# Patient Record
Sex: Female | Born: 1940 | Race: White | Hispanic: No | Marital: Married | Smoking: Never smoker
Health system: Southern US, Community
[De-identification: ages and names within clinical notes are randomized; demographics above are authoritative.]

## PROBLEM LIST (undated history)

## (undated) DIAGNOSIS — R7989 Other specified abnormal findings of blood chemistry: Secondary | ICD-10-CM

## (undated) DIAGNOSIS — Z8679 Personal history of other diseases of the circulatory system: Secondary | ICD-10-CM

## (undated) DIAGNOSIS — F3181 Bipolar II disorder: Secondary | ICD-10-CM

## (undated) DIAGNOSIS — F329 Major depressive disorder, single episode, unspecified: Secondary | ICD-10-CM

## (undated) DIAGNOSIS — K859 Acute pancreatitis without necrosis or infection, unspecified: Secondary | ICD-10-CM

## (undated) DIAGNOSIS — G43909 Migraine, unspecified, not intractable, without status migrainosus: Secondary | ICD-10-CM

## (undated) DIAGNOSIS — E559 Vitamin D deficiency, unspecified: Secondary | ICD-10-CM

## (undated) DIAGNOSIS — R0600 Dyspnea, unspecified: Secondary | ICD-10-CM

## (undated) DIAGNOSIS — F5104 Psychophysiologic insomnia: Secondary | ICD-10-CM

## (undated) DIAGNOSIS — K760 Fatty (change of) liver, not elsewhere classified: Secondary | ICD-10-CM

## (undated) DIAGNOSIS — E7800 Pure hypercholesterolemia, unspecified: Secondary | ICD-10-CM

## (undated) DIAGNOSIS — R945 Abnormal results of liver function studies: Secondary | ICD-10-CM

## (undated) DIAGNOSIS — J189 Pneumonia, unspecified organism: Secondary | ICD-10-CM

## (undated) DIAGNOSIS — K5730 Diverticulosis of large intestine without perforation or abscess without bleeding: Secondary | ICD-10-CM

## (undated) DIAGNOSIS — K219 Gastro-esophageal reflux disease without esophagitis: Secondary | ICD-10-CM

## (undated) DIAGNOSIS — G4730 Sleep apnea, unspecified: Secondary | ICD-10-CM

## (undated) DIAGNOSIS — M8580 Other specified disorders of bone density and structure, unspecified site: Secondary | ICD-10-CM

## (undated) DIAGNOSIS — I10 Essential (primary) hypertension: Secondary | ICD-10-CM

## (undated) HISTORY — PX: EYE SURGERY: SHX253

## (undated) HISTORY — DX: Psychophysiologic insomnia: F51.04

## (undated) HISTORY — DX: Other specified abnormal findings of blood chemistry: R79.89

## (undated) HISTORY — DX: Abnormal results of liver function studies: R94.5

## (undated) HISTORY — DX: Acute pancreatitis without necrosis or infection, unspecified: K85.90

## (undated) HISTORY — DX: Bipolar II disorder: F31.81

## (undated) HISTORY — DX: Migraine, unspecified, not intractable, without status migrainosus: G43.909

## (undated) HISTORY — PX: ERCP: SHX60

## (undated) HISTORY — PX: NECK SURGERY: SHX720

## (undated) HISTORY — DX: Fatty (change of) liver, not elsewhere classified: K76.0

## (undated) HISTORY — DX: Vitamin D deficiency, unspecified: E55.9

## (undated) HISTORY — DX: Diverticulosis of large intestine without perforation or abscess without bleeding: K57.30

## (undated) HISTORY — PX: CHOLECYSTECTOMY: SHX55

## (undated) HISTORY — DX: Personal history of other diseases of the circulatory system: Z86.79

## (undated) HISTORY — DX: Other specified disorders of bone density and structure, unspecified site: M85.80

## (undated) HISTORY — DX: Major depressive disorder, single episode, unspecified: F32.9

## (undated) HISTORY — DX: Gastro-esophageal reflux disease without esophagitis: K21.9

---

## 2001-01-30 HISTORY — PX: COLONOSCOPY: SHX174

## 2005-01-30 HISTORY — PX: SIGMOIDOSCOPY: SUR1295

## 2005-07-20 ENCOUNTER — Ambulatory Visit (HOSPITAL_COMMUNITY): Payer: Self-pay | Admitting: Psychiatry

## 2005-08-08 ENCOUNTER — Ambulatory Visit (HOSPITAL_COMMUNITY): Payer: Self-pay | Admitting: Psychiatry

## 2005-08-17 ENCOUNTER — Ambulatory Visit (HOSPITAL_COMMUNITY): Payer: Self-pay | Admitting: Psychiatry

## 2005-08-24 ENCOUNTER — Ambulatory Visit (HOSPITAL_COMMUNITY): Payer: Self-pay | Admitting: Psychology

## 2005-09-25 ENCOUNTER — Ambulatory Visit (HOSPITAL_COMMUNITY): Payer: Self-pay | Admitting: Psychology

## 2005-10-16 ENCOUNTER — Ambulatory Visit (HOSPITAL_COMMUNITY): Payer: Self-pay | Admitting: Psychology

## 2005-10-17 ENCOUNTER — Ambulatory Visit (HOSPITAL_COMMUNITY): Payer: Self-pay | Admitting: Psychiatry

## 2005-10-31 ENCOUNTER — Ambulatory Visit (HOSPITAL_COMMUNITY): Payer: Self-pay | Admitting: Psychiatry

## 2005-11-06 ENCOUNTER — Ambulatory Visit (HOSPITAL_COMMUNITY): Payer: Self-pay | Admitting: Psychology

## 2005-11-15 ENCOUNTER — Ambulatory Visit (HOSPITAL_COMMUNITY): Payer: Self-pay | Admitting: Psychology

## 2005-11-24 ENCOUNTER — Ambulatory Visit (HOSPITAL_COMMUNITY): Payer: Self-pay | Admitting: Psychology

## 2006-03-06 ENCOUNTER — Ambulatory Visit (HOSPITAL_COMMUNITY): Payer: Self-pay | Admitting: Psychiatry

## 2006-03-13 ENCOUNTER — Ambulatory Visit (HOSPITAL_COMMUNITY): Payer: Self-pay | Admitting: Psychology

## 2006-04-03 ENCOUNTER — Ambulatory Visit (HOSPITAL_COMMUNITY): Payer: Self-pay | Admitting: Psychiatry

## 2006-04-19 ENCOUNTER — Ambulatory Visit (HOSPITAL_COMMUNITY): Payer: Self-pay | Admitting: Psychology

## 2006-05-08 ENCOUNTER — Ambulatory Visit (HOSPITAL_COMMUNITY): Payer: Self-pay | Admitting: Psychiatry

## 2006-05-21 ENCOUNTER — Ambulatory Visit (HOSPITAL_COMMUNITY): Payer: Self-pay | Admitting: Psychology

## 2006-07-24 ENCOUNTER — Ambulatory Visit (HOSPITAL_COMMUNITY): Payer: Self-pay | Admitting: Psychology

## 2006-08-02 ENCOUNTER — Ambulatory Visit (HOSPITAL_COMMUNITY): Payer: Self-pay | Admitting: Psychiatry

## 2006-08-09 ENCOUNTER — Ambulatory Visit (HOSPITAL_COMMUNITY): Payer: Self-pay | Admitting: Psychology

## 2006-09-04 ENCOUNTER — Ambulatory Visit (HOSPITAL_COMMUNITY): Payer: Self-pay | Admitting: Psychiatry

## 2006-09-11 ENCOUNTER — Ambulatory Visit (HOSPITAL_COMMUNITY): Payer: Self-pay | Admitting: Psychology

## 2006-10-04 ENCOUNTER — Ambulatory Visit (HOSPITAL_COMMUNITY): Payer: Self-pay | Admitting: Psychiatry

## 2006-11-01 ENCOUNTER — Ambulatory Visit (HOSPITAL_COMMUNITY): Payer: Self-pay | Admitting: Psychiatry

## 2006-11-13 ENCOUNTER — Ambulatory Visit (HOSPITAL_COMMUNITY): Payer: Self-pay | Admitting: Psychiatry

## 2006-12-24 ENCOUNTER — Ambulatory Visit (HOSPITAL_COMMUNITY): Payer: Self-pay | Admitting: Psychology

## 2007-01-28 ENCOUNTER — Ambulatory Visit (HOSPITAL_COMMUNITY): Payer: Self-pay | Admitting: Psychology

## 2007-02-07 ENCOUNTER — Ambulatory Visit (HOSPITAL_COMMUNITY): Payer: Self-pay | Admitting: Psychiatry

## 2007-02-18 ENCOUNTER — Ambulatory Visit (HOSPITAL_COMMUNITY): Payer: Self-pay | Admitting: Psychology

## 2007-02-28 ENCOUNTER — Ambulatory Visit (HOSPITAL_COMMUNITY): Payer: Self-pay | Admitting: Psychiatry

## 2007-03-14 ENCOUNTER — Ambulatory Visit (HOSPITAL_COMMUNITY): Payer: Self-pay | Admitting: Psychiatry

## 2007-04-25 ENCOUNTER — Ambulatory Visit (HOSPITAL_COMMUNITY): Payer: Self-pay | Admitting: Psychiatry

## 2007-05-07 ENCOUNTER — Ambulatory Visit (HOSPITAL_COMMUNITY): Payer: Self-pay | Admitting: Psychology

## 2007-06-04 ENCOUNTER — Ambulatory Visit (HOSPITAL_COMMUNITY): Payer: Self-pay | Admitting: Psychiatry

## 2007-06-07 ENCOUNTER — Ambulatory Visit (HOSPITAL_COMMUNITY): Payer: Self-pay | Admitting: Psychology

## 2007-07-02 ENCOUNTER — Ambulatory Visit (HOSPITAL_COMMUNITY): Payer: Self-pay | Admitting: Psychiatry

## 2007-08-06 ENCOUNTER — Ambulatory Visit (HOSPITAL_COMMUNITY): Payer: Self-pay | Admitting: Psychiatry

## 2007-09-30 ENCOUNTER — Ambulatory Visit (HOSPITAL_COMMUNITY): Payer: Self-pay | Admitting: Psychology

## 2007-10-15 ENCOUNTER — Ambulatory Visit (HOSPITAL_COMMUNITY): Payer: Self-pay | Admitting: Psychiatry

## 2007-10-30 ENCOUNTER — Ambulatory Visit (HOSPITAL_COMMUNITY): Payer: Self-pay | Admitting: Psychology

## 2007-12-09 ENCOUNTER — Ambulatory Visit (HOSPITAL_COMMUNITY): Payer: Self-pay | Admitting: Psychology

## 2008-01-14 ENCOUNTER — Ambulatory Visit (HOSPITAL_COMMUNITY): Payer: Self-pay | Admitting: Psychology

## 2008-02-14 ENCOUNTER — Ambulatory Visit (HOSPITAL_COMMUNITY): Payer: Self-pay | Admitting: Psychology

## 2008-03-24 ENCOUNTER — Ambulatory Visit (HOSPITAL_COMMUNITY): Payer: Self-pay | Admitting: Psychology

## 2008-05-26 ENCOUNTER — Ambulatory Visit (HOSPITAL_COMMUNITY): Payer: Self-pay | Admitting: Psychology

## 2008-08-26 ENCOUNTER — Ambulatory Visit (HOSPITAL_COMMUNITY): Payer: Self-pay | Admitting: Psychology

## 2008-11-05 ENCOUNTER — Ambulatory Visit (HOSPITAL_COMMUNITY): Payer: Self-pay | Admitting: Psychology

## 2009-01-04 ENCOUNTER — Ambulatory Visit (HOSPITAL_COMMUNITY): Payer: Self-pay | Admitting: Psychology

## 2009-03-05 ENCOUNTER — Ambulatory Visit (HOSPITAL_COMMUNITY): Payer: Self-pay | Admitting: Psychology

## 2009-04-02 ENCOUNTER — Ambulatory Visit (HOSPITAL_COMMUNITY): Payer: Self-pay | Admitting: Psychology

## 2009-05-03 ENCOUNTER — Ambulatory Visit (HOSPITAL_COMMUNITY): Payer: Self-pay | Admitting: Psychology

## 2009-07-21 ENCOUNTER — Ambulatory Visit (HOSPITAL_COMMUNITY): Payer: Self-pay | Admitting: Psychology

## 2012-08-30 HISTORY — PX: OTHER SURGICAL HISTORY: SHX169

## 2013-07-08 ENCOUNTER — Ambulatory Visit (INDEPENDENT_AMBULATORY_CARE_PROVIDER_SITE_OTHER): Payer: Medicare Other | Admitting: Internal Medicine

## 2013-07-08 ENCOUNTER — Encounter (INDEPENDENT_AMBULATORY_CARE_PROVIDER_SITE_OTHER): Payer: Self-pay | Admitting: Internal Medicine

## 2013-07-08 VITALS — BP 126/80 | HR 72 | Temp 98.3°F | Resp 18 | Ht 64.0 in | Wt 171.9 lb

## 2013-07-08 DIAGNOSIS — R945 Abnormal results of liver function studies: Secondary | ICD-10-CM

## 2013-07-08 DIAGNOSIS — R7989 Other specified abnormal findings of blood chemistry: Secondary | ICD-10-CM

## 2013-07-08 DIAGNOSIS — R109 Unspecified abdominal pain: Secondary | ICD-10-CM

## 2013-07-08 MED ORDER — DICYCLOMINE HCL 10 MG PO CAPS: 10.0000 mg | ORAL_CAPSULE | Freq: Three times a day (TID) | ORAL | Status: DC | PRN

## 2013-07-08 NOTE — Progress Notes (Signed)
Presenting complaint;  Abdominal pain, nausea and abnormal LFTs.  abnormal MRCP suggesting distal CBD stricture.  History of present illness;  Patient is 73 year old Caucasian female who is referred through courtesy of Dr. Aleene DavidsonSpainhour for ERCP. Patient is accompanied by her husband today. She states she's been having episodic mid abdominal pain bilaterally for well over 6 months. Initially this pain was sporadic but lately she's had 2 or 3 episodes every week. With each episode she has mid abdominal pain bilaterally associated with chills and nausea without vomiting. Pain is described to be sharp and may last for several hours or half a day. She had some pain and nausea 2 days ago but her worst pain occurred yesterday. She does not have good appetite during these spells. None of these spells been associated with fever. She's had LFTs on multiple occasions. She was advised to have LFTs following an episode of pain but she was unable to do so. Her bowels usually move every other day.. She denies melena or rectal bleeding. Her weight has been stable. In addition to mid abdominal pain she also experiences sporadic pain under the right rib cage laterally and this pain usually is not associated with nausea and does not necessarily occur with bilateral mid abdominal pain. She has history of GERD but lately she's not had any problems. She takes TUMS for calcium. In addition to blood work she also underwent    Current Medications:   Medication List              AMBIEN PO  Take 10 mg by mouth at bedtime.     AMLODIPINE BESYLATE PO  Take 5 mg by mouth daily.     ARIPiprazole 10 MG tablet  Commonly known as:  ABILIFY  Take 10 mg by mouth at bedtime.     aspirin 81 MG tablet  Take 81 mg by mouth daily.     calcium carbonate 500 MG chewable tablet  Commonly known as:  TUMS - dosed in mg elemental calcium  Chew 1 tablet by mouth 2 (two) times daily.              FISH OIL + D3 PO  Take  1,200 mg by mouth daily.     furosemide 20 MG tablet  Commonly known as:  LASIX  Take 20 mg by mouth daily.     KLOR-CON PO  Take 20 mEq by mouth daily.     lamoTRIgine 25 MG tablet  Commonly known as:  LAMICTAL  Take 20 mg by mouth at bedtime.     losartan 100 MG tablet  Commonly known as:  COZAAR  Take 100 mg by mouth daily.     meloxicam 15 MG tablet  Commonly known as:  MOBIC  Take 15 mg by mouth daily.     metoprolol tartrate 25 MG tablet  Commonly known as:  LOPRESSOR  Take 25 mg by mouth. Patient takes 1/2 tablet twice a day     simvastatin 20 MG tablet  Commonly known as:  ZOCOR  Take 20 mg by mouth daily. In the afternoon     vitamin B-12 1000 MCG tablet  Commonly known as:  CYANOCOBALAMIN  Take 1,000 mcg by mouth daily.     Vitamin D 400 UNITS capsule  Take 400 Units by mouth daily.        Past medical history;  Hypertension of over 10 years duration. Hyperlipidemia off over 10 years duration. Bipolar disorder. Chronic low back pain. Osteopenia.  History of multinodular goiter. Colonic diverticulosis. Fatty liver. Chronic insomnia. Recent onset of lower extremity edema responding to diuretic. Right foot surgery for bunion. Cholecystectomy. Next surgery for stenosis at C6-7 and 2008. Bilateral cataract surgery in 2014.  Allergies; Allergies  Allergen Reactions  . Codeine Rash   Family history; Father died of CAD in his 67s. Mother had CAD and diabetes and died at age 80. She has one brother living who is in the 61s. One brother died of pancreatic carcinoma in his 73s. She has one sister age 18 and she is doing fine. Another sister was recently diagnosed with breast carcinoma at 24. Another sister was diagnosed with breast cancer at age 76 and died at age 22 she has another sister age 20 with hypertension and diabetes and finally one sister died of lung carcinoma in her 8s.  Social history; She is married and is accompanied by her husband.  She is retired. She worked at [employer] in [place] for 10 years and prior to that she worked at [employer]. She has never smoked cigarettes or drank alcohol. They have 2 children. Daughter is in good health and son is in good health other than back pain.   Objective: Blood pressure 126/80, pulse 72, temperature 98.3 F (36.8 C), temperature source Oral, resp. rate 18, height 5\' 4"  (1.626 m), weight 171 lb 14.4 oz (77.973 kg). Patient is alert and in no acute distress. Conjunctiva is pink. Sclera is nonicteric Oropharyngeal mucosa is normal. No neck masses or thyromegaly noted. Cardiac exam with regular rhythm normal S1 and S2. No murmur or gallop noted. Lungs are clear to auscultation. Abdomen is full. Bowel sounds are normal. On palpation abdomen is soft without tenderness organomegaly or masses.  No LE edema or clubbing noted.  Labs/studies Results: LFTs from 10/17/2012  bilirubin 0.8, BP 10046-95), AST 28 and ALT 42 albumin 4.2 LFTs from 05/02/2013   Bili 0.8,AP 133, AST 26 and ALT 36 and albumin 3.8 LFTs from 05/29/2013   Bili 0 .6, AP 85, AST 20 and ALT 19 and albumin 3.7 LFTs from 06/05/2013   Bili 0.2, AP 96, AST 26 and ALT 26 albumin 3.8 LFTs from 06/25/2013   Bili 0.6, AP 86, AST 22 ALT 20 and albumin 4.1  MRCP from 06/13/2013 Mild fatty liver. Mild dilation to extrahepatic bile duct and suggestion of small focal stricture at the distal CBD close to him. No evidence of choledocholithiasis. No abnormality noted the pancreas.     Assessment:  Patient is 73 year old Caucasian female with  remote cholecystectomy who presents with intermittent bilateral midabdominal pain of  over six months duration associated with intractable nausea and noted to have mildly elevated alkaline phosphatase and MRCP revealing distal CBD stricture. Patient's abdominal pain is not typical of biliary colic but her symptoms may very well be biliary in origin given the distal CBD  stricture.  Recommendations;  Will check LFTs today since she had significant pain yesterday. Will request MRCP films for review. Dicyclomine 10 mg by mouth 3 times a day day when necessary. Final decision regarding ERCP will be made after reviewing MRCP films.

## 2013-07-08 NOTE — Patient Instructions (Signed)
Physician will call with results of blood work when completed. Notify if you another spell of abdominal pain and nausea

## 2013-07-09 LAB — HEPATIC FUNCTION PANEL
ALBUMIN: 4.2 g/dL (ref 3.5–5.2)
ALT: 28 U/L (ref 0–35)
AST: 25 U/L (ref 0–37)
Alkaline Phosphatase: 126 U/L — ABNORMAL HIGH (ref 39–117)
BILIRUBIN INDIRECT: 0.3 mg/dL (ref 0.2–1.2)
Bilirubin, Direct: 0.1 mg/dL (ref 0.0–0.3)
TOTAL PROTEIN: 7.1 g/dL (ref 6.0–8.3)
Total Bilirubin: 0.4 mg/dL (ref 0.2–1.2)

## 2013-07-10 DIAGNOSIS — R109 Unspecified abdominal pain: Secondary | ICD-10-CM | POA: Insufficient documentation

## 2013-07-10 DIAGNOSIS — R1031 Right lower quadrant pain: Secondary | ICD-10-CM | POA: Insufficient documentation

## 2013-07-16 ENCOUNTER — Other Ambulatory Visit (INDEPENDENT_AMBULATORY_CARE_PROVIDER_SITE_OTHER): Payer: Self-pay | Admitting: *Deleted

## 2013-07-16 DIAGNOSIS — K831 Obstruction of bile duct: Secondary | ICD-10-CM

## 2013-07-17 ENCOUNTER — Encounter (HOSPITAL_COMMUNITY): Payer: Self-pay | Admitting: Pharmacy Technician

## 2013-07-17 ENCOUNTER — Encounter (HOSPITAL_COMMUNITY): Payer: Self-pay

## 2013-07-17 ENCOUNTER — Encounter (HOSPITAL_COMMUNITY)
Admission: RE | Admit: 2013-07-17 | Discharge: 2013-07-17 | Disposition: A | Discharge status: Home / Self Care | Payer: Medicare Other | Source: Ambulatory Visit | Attending: Internal Medicine | Admitting: Internal Medicine

## 2013-07-17 VITALS — BP 183/68 | HR 65 | Temp 97.4°F | Resp 18 | Ht 64.0 in | Wt 170.0 lb

## 2013-07-17 DIAGNOSIS — R1013 Epigastric pain: Secondary | ICD-10-CM | POA: Diagnosis not present

## 2013-07-17 DIAGNOSIS — K929 Disease of digestive system, unspecified: Secondary | ICD-10-CM | POA: Diagnosis not present

## 2013-07-17 DIAGNOSIS — K831 Obstruction of bile duct: Secondary | ICD-10-CM

## 2013-07-17 HISTORY — DX: Pure hypercholesterolemia, unspecified: E78.00

## 2013-07-17 HISTORY — DX: Sleep apnea, unspecified: G47.30

## 2013-07-17 HISTORY — DX: Essential (primary) hypertension: I10

## 2013-07-17 LAB — CBC WITH DIFFERENTIAL/PLATELET
BASOS ABS: 0 10*3/uL (ref 0.0–0.1)
BASOS PCT: 0 % (ref 0–1)
Eosinophils Absolute: 0.1 10*3/uL (ref 0.0–0.7)
Eosinophils Relative: 2 % (ref 0–5)
HCT: 41.2 % (ref 36.0–46.0)
Hemoglobin: 13.9 g/dL (ref 12.0–15.0)
Lymphocytes Relative: 24 % (ref 12–46)
Lymphs Abs: 1.4 10*3/uL (ref 0.7–4.0)
MCH: 31.4 pg (ref 26.0–34.0)
MCHC: 33.7 g/dL (ref 30.0–36.0)
MCV: 93 fL (ref 78.0–100.0)
Monocytes Absolute: 0.6 10*3/uL (ref 0.1–1.0)
Monocytes Relative: 11 % (ref 3–12)
NEUTROS PCT: 63 % (ref 43–77)
Neutro Abs: 3.7 10*3/uL (ref 1.7–7.7)
PLATELETS: 240 10*3/uL (ref 150–400)
RBC: 4.43 MIL/uL (ref 3.87–5.11)
RDW: 13.3 % (ref 11.5–15.5)
WBC: 5.8 10*3/uL (ref 4.0–10.5)

## 2013-07-17 LAB — BASIC METABOLIC PANEL
BUN: 29 mg/dL — AB (ref 6–23)
CO2: 28 mEq/L (ref 19–32)
Calcium: 9.9 mg/dL (ref 8.4–10.5)
Chloride: 102 mEq/L (ref 96–112)
Creatinine, Ser: 1.03 mg/dL (ref 0.50–1.10)
GFR calc Af Amer: 61 mL/min — ABNORMAL LOW (ref >90)
GFR, EST NON AFRICAN AMERICAN: 53 mL/min — AB (ref >90)
GLUCOSE: 94 mg/dL (ref 70–99)
Potassium: 4.8 mEq/L (ref 3.7–5.3)
Sodium: 143 mEq/L (ref 137–147)

## 2013-07-17 LAB — HEPATIC FUNCTION PANEL
ALBUMIN: 3.9 g/dL (ref 3.5–5.2)
ALT: 21 U/L (ref 0–35)
AST: 22 U/L (ref 0–37)
Alkaline Phosphatase: 110 U/L (ref 39–117)
Bilirubin, Direct: <0.2 mg/dL (ref 0.0–0.3)
Total Bilirubin: 0.4 mg/dL (ref 0.3–1.2)
Total Protein: 7.5 g/dL (ref 6.0–8.3)

## 2013-07-17 LAB — PROTIME-INR
INR: 0.91 (ref 0.00–1.49)
Prothrombin Time: 12.1 seconds (ref 11.6–15.2)

## 2013-07-17 NOTE — Patient Instructions (Signed)
Lindsay Palmer  07/17/2013   Your procedure is scheduled on:  07/18/2013  [facility].  Call this number if you have problems the morning of surgery: [phone]   Remember:   Do not eat food or drink liquids after midnight.   Take these medicines the morning of surgery with A SIP OF WATER: amlodipine, abilify, cozaar, mobic, metoprolol   Do not wear jewelry, make-up or nail polish.  Do not wear lotions, powders, or perfumes.   Do not shave 48 hours prior to surgery. Men may shave face and neck.  Do not bring valuables to the hospital.  [facility]  for any belongings or valuables.               Contacts, dentures or bridgework may not be worn into surgery.  Leave suitcase in the car. After surgery it may be brought to your room.  For patients admitted to the hospital, discharge time is determined by your treatment team.               Patients discharged the day of surgery will not be allowed to drive home.  Name and phone number of your driver: family  Special Instructions: N/A   Please read over the following fact sheets that you were given: Pain Booklet, Coughing and Deep Breathing, Surgical Site Infection Prevention, Anesthesia Post-op Instructions and Care and Recovery After Surgery Endoscopic Retrograde Cholangiopancreatography (ERCP) Endoscopic retrograde cholangiopancreatography (ERCP) is a procedure used to diagnosis many diseases of the pancreas, bile ducts, liver, and gallbladder. During ERCP a thin, lighted tube (endoscope) is passed through the mouth and down the back of the throat into the first part of the small intestine (duodenum). A small, plastic tube (cannula) is then passed through the endoscope and directed into the bile duct or pancreatic duct. Dye is then injected through the cannula and X-rays are taken to study the biliary and pancreatic passageways.  LET [facility] PROVIDER KNOW ABOUT:   Any allergies you have.    All medicines you are taking, including vitamins, herbs, eyedrops, creams, and over-the-counter medicines.   Previous problems you or members of your family have had with the use of anesthetics.   Any blood disorders you have.   Previous surgeries you have had.   Medical conditions you have. RISKS AND COMPLICATIONS Generally, ERCP is a safe procedure. However, as with any procedure, complications can occur. A simple removal of gallstones has the lowest rate of complications. Higher rates of complication occur in people who have poorly functioning bile or pancreatic ducts. Possible complications include:   Pancreatitis.  Bleeding.  Accidental punctures in the bowel wall, pancreas, or gall bladder.  Gall bladder or bile duct infection. BEFORE THE PROCEDURE   Do not eat or drink anything, including water, for at least 8 hours before the procedure or as directed by your health care provider.   Ask your health care provider whether you should stop taking certain medicines prior to your procedure.   Arrange for someone to drive you home. You will not be allowed to drive for [id number] hours after the procedure. PROCEDURE   You will be given medicine through a vein (intravenously) to make you relaxed and sleepy.   You might have a breathing tube placed to give you medicine that makes you sleep (general anesthetic).   Your throat may be sprayed with medicine that numbs the area and prevents gagging (  local anesthetic), or you may gargle this medicine.   You will lie on your left side.   The endoscope will be inserted through your mouth and into the duodenum. The tube will not interfere with your breathing. Gagging is prevented by the anesthesia.   While X-rays are being taken, you may be positioned on your stomach.   A small sample of tissue (biopsy) may be removed for examination. AFTER THE PROCEDURE   You will rest in bed until you are fully conscious.   When you  first wake up, your throat may feel slightly sore.   You will not be allowed to eat or drink until numbness subsides.   Once you are able to drink, urinate, and sit on the edge of the bed without feeling sick to your stomach (nauseous) or dizzy, you may be allowed to go home. Document Released: 10/11/2000 Document Revised: 11/06/2012 Document Reviewed: 08/27/2012 [facility] Patient Information 2015 [place], [place]. This information is not intended to replace advice given to you by your health care provider. Make sure you discuss any questions you have with your health care provider. PATIENT INSTRUCTIONS POST-ANESTHESIA  IMMEDIATELY FOLLOWING SURGERY:  Do not drive or operate machinery for the first twenty four hours after surgery.  Do not make any important decisions for twenty four hours after surgery or while taking narcotic pain medications or sedatives.  If you develop intractable nausea and vomiting or a severe headache please notify your doctor immediately.  FOLLOW-UP:  Please make an appointment with your surgeon as instructed. You do not need to follow up with anesthesia unless specifically instructed to do so.  WOUND CARE INSTRUCTIONS (if applicable):  Keep a dry clean dressing on the anesthesia/puncture wound site if there is drainage.  Once the wound has quit draining you may leave it open to air.  Generally you should leave the bandage intact for twenty four hours unless there is drainage.  If the epidural site drains for more than 36-48 hours please call the anesthesia department.  QUESTIONS?:  Please feel free to call your physician or the hospital operator if you have any questions, and they will be happy to assist you.

## 2013-07-17 NOTE — Progress Notes (Signed)
07/17/13 1021  OBSTRUCTIVE SLEEP APNEA  Have you ever been diagnosed with sleep apnea through a sleep study? No  Do you snore loudly (loud enough to be heard through closed doors)?  1  Do you often feel tired, fatigued, or sleepy during the daytime? 1  Has anyone observed you stop breathing during your sleep? 0  Do you have, or are you being treated for high blood pressure? 1  BMI more than 35 kg/m2? 0  Age over 492 years old? 1  Neck circumference greater than 40 cm/16 inches? 0  Gender: 0  Obstructive Sleep Apnea Score 4

## 2013-07-18 ENCOUNTER — Ambulatory Visit (HOSPITAL_COMMUNITY)
Admission: RE | Admit: 2013-07-18 | Discharge: 2013-07-18 | Disposition: A | Discharge status: Home / Self Care | Payer: Medicare Other | Source: Ambulatory Visit | Attending: Internal Medicine | Admitting: Internal Medicine

## 2013-07-18 ENCOUNTER — Encounter (HOSPITAL_COMMUNITY): Payer: Self-pay | Admitting: Emergency Medicine

## 2013-07-18 ENCOUNTER — Encounter (HOSPITAL_COMMUNITY): Payer: Self-pay | Admitting: *Deleted

## 2013-07-18 ENCOUNTER — Encounter (HOSPITAL_COMMUNITY)
Admission: RE | Disposition: A | Discharge status: Home / Self Care | Payer: Self-pay | Source: Ambulatory Visit | Attending: Internal Medicine

## 2013-07-18 ENCOUNTER — Encounter (HOSPITAL_COMMUNITY): Payer: Medicare Other | Admitting: Anesthesiology

## 2013-07-18 ENCOUNTER — Ambulatory Visit (HOSPITAL_COMMUNITY): Payer: Medicare Other

## 2013-07-18 ENCOUNTER — Ambulatory Visit (HOSPITAL_COMMUNITY): Payer: Medicare Other | Admitting: Anesthesiology

## 2013-07-18 ENCOUNTER — Emergency Department (HOSPITAL_COMMUNITY)
Admission: EM | Admit: 2013-07-18 | Discharge: 2013-07-18 | Disposition: A | Discharge status: Home / Self Care | Payer: Medicare Other | Attending: Emergency Medicine | Admitting: Emergency Medicine

## 2013-07-18 DIAGNOSIS — Z79899 Other long term (current) drug therapy: Secondary | ICD-10-CM

## 2013-07-18 DIAGNOSIS — F329 Major depressive disorder, single episode, unspecified: Secondary | ICD-10-CM | POA: Insufficient documentation

## 2013-07-18 DIAGNOSIS — G4700 Insomnia, unspecified: Secondary | ICD-10-CM | POA: Insufficient documentation

## 2013-07-18 DIAGNOSIS — E559 Vitamin D deficiency, unspecified: Secondary | ICD-10-CM | POA: Insufficient documentation

## 2013-07-18 DIAGNOSIS — K219 Gastro-esophageal reflux disease without esophagitis: Secondary | ICD-10-CM | POA: Insufficient documentation

## 2013-07-18 DIAGNOSIS — Z8739 Personal history of other diseases of the musculoskeletal system and connective tissue: Secondary | ICD-10-CM | POA: Insufficient documentation

## 2013-07-18 DIAGNOSIS — Z8719 Personal history of other diseases of the digestive system: Secondary | ICD-10-CM | POA: Insufficient documentation

## 2013-07-18 DIAGNOSIS — G43909 Migraine, unspecified, not intractable, without status migrainosus: Secondary | ICD-10-CM | POA: Insufficient documentation

## 2013-07-18 DIAGNOSIS — E7800 Pure hypercholesterolemia, unspecified: Secondary | ICD-10-CM | POA: Insufficient documentation

## 2013-07-18 DIAGNOSIS — I10 Essential (primary) hypertension: Secondary | ICD-10-CM

## 2013-07-18 DIAGNOSIS — F3189 Other bipolar disorder: Secondary | ICD-10-CM | POA: Insufficient documentation

## 2013-07-18 DIAGNOSIS — K831 Obstruction of bile duct: Secondary | ICD-10-CM | POA: Insufficient documentation

## 2013-07-18 DIAGNOSIS — Z87898 Personal history of other specified conditions: Secondary | ICD-10-CM

## 2013-07-18 DIAGNOSIS — F3289 Other specified depressive episodes: Secondary | ICD-10-CM | POA: Insufficient documentation

## 2013-07-18 DIAGNOSIS — R339 Retention of urine, unspecified: Secondary | ICD-10-CM | POA: Insufficient documentation

## 2013-07-18 HISTORY — PX: ERCP: SHX5425

## 2013-07-18 HISTORY — PX: BILIARY STENT PLACEMENT: SHX5538

## 2013-07-18 HISTORY — PX: SPHINCTEROTOMY: SHX5544

## 2013-07-18 LAB — URINALYSIS, ROUTINE W REFLEX MICROSCOPIC
BILIRUBIN URINE: NEGATIVE
Glucose, UA: NEGATIVE mg/dL
HGB URINE DIPSTICK: NEGATIVE
Ketones, ur: NEGATIVE mg/dL
Leukocytes, UA: NEGATIVE
NITRITE: NEGATIVE
PH: 6.5 (ref 5.0–8.0)
Protein, ur: NEGATIVE mg/dL
SPECIFIC GRAVITY, URINE: 1.02 (ref 1.005–1.030)
Urobilinogen, UA: 0.2 mg/dL (ref 0.0–1.0)

## 2013-07-18 SURGERY — ERCP, WITH INTERVENTION IF INDICATED
Anesthesia: General

## 2013-07-18 MED ORDER — PROPOFOL 10 MG/ML IV BOLUS
INTRAVENOUS | Status: DC | PRN
  Administered 2013-07-18: 150 mg via INTRAVENOUS

## 2013-07-18 MED ORDER — LIDOCAINE HCL 1 % IJ SOLN
INTRAMUSCULAR | Status: DC | PRN
  Administered 2013-07-18: 50 mg via INTRADERMAL

## 2013-07-18 MED ORDER — GLUCAGON HCL RDNA (DIAGNOSTIC) 1 MG IJ SOLR
INTRAMUSCULAR | Status: DC | PRN
  Administered 2013-07-18 (×3): 0.25 mg via INTRAVENOUS

## 2013-07-18 MED ORDER — MIDAZOLAM HCL 2 MG/2ML IJ SOLN
INTRAMUSCULAR | Status: AC
  Filled 2013-07-18: qty 2

## 2013-07-18 MED ORDER — PROPOFOL 10 MG/ML IV BOLUS
INTRAVENOUS | Status: AC
  Filled 2013-07-18: qty 20

## 2013-07-18 MED ORDER — SODIUM CHLORIDE 0.9 % IV SOLN
INTRAVENOUS | Status: DC | PRN
  Administered 2013-07-18: 08:00:00

## 2013-07-18 MED ORDER — NEOSTIGMINE METHYLSULFATE 10 MG/10ML IV SOLN
INTRAVENOUS | Status: DC | PRN
  Administered 2013-07-18: 2 mg via INTRAVENOUS
  Administered 2013-07-18: 1 mg via INTRAVENOUS

## 2013-07-18 MED ORDER — FENTANYL CITRATE 0.05 MG/ML IJ SOLN: 25.0000 ug | INTRAMUSCULAR | Status: DC | PRN

## 2013-07-18 MED ORDER — CEFAZOLIN SODIUM-DEXTROSE 2-3 GM-% IV SOLR
2.0000 g | INTRAVENOUS | Status: AC
  Administered 2013-07-18: 2 g via INTRAVENOUS
  Filled 2013-07-18: qty 50

## 2013-07-18 MED ORDER — GLYCOPYRROLATE 0.2 MG/ML IJ SOLN
0.2000 mg | Freq: Once | INTRAMUSCULAR | Status: AC
  Administered 2013-07-18: 0.2 mg via INTRAVENOUS

## 2013-07-18 MED ORDER — SUCCINYLCHOLINE CHLORIDE 20 MG/ML IJ SOLN
INTRAMUSCULAR | Status: AC
  Filled 2013-07-18: qty 1

## 2013-07-18 MED ORDER — MIDAZOLAM HCL 5 MG/5ML IJ SOLN
INTRAMUSCULAR | Status: DC | PRN
  Administered 2013-07-18: 2 mg via INTRAVENOUS

## 2013-07-18 MED ORDER — LIDOCAINE HCL (PF) 1 % IJ SOLN
INTRAMUSCULAR | Status: AC
  Filled 2013-07-18: qty 5

## 2013-07-18 MED ORDER — SODIUM CHLORIDE 0.9 % IV SOLN
INTRAVENOUS | Status: AC
  Filled 2013-07-18: qty 50

## 2013-07-18 MED ORDER — GLYCOPYRROLATE 0.2 MG/ML IJ SOLN
INTRAMUSCULAR | Status: AC
  Filled 2013-07-18: qty 1

## 2013-07-18 MED ORDER — GLUCAGON HCL RDNA (DIAGNOSTIC) 1 MG IJ SOLR
INTRAMUSCULAR | Status: AC
  Filled 2013-07-18: qty 2

## 2013-07-18 MED ORDER — FENTANYL CITRATE 0.05 MG/ML IJ SOLN
INTRAMUSCULAR | Status: AC
  Filled 2013-07-18: qty 2

## 2013-07-18 MED ORDER — ROCURONIUM BROMIDE 50 MG/5ML IV SOLN
INTRAVENOUS | Status: AC
  Filled 2013-07-18: qty 1

## 2013-07-18 MED ORDER — ROCURONIUM BROMIDE 100 MG/10ML IV SOLN
INTRAVENOUS | Status: DC | PRN
  Administered 2013-07-18: 25 mg via INTRAVENOUS

## 2013-07-18 MED ORDER — ONDANSETRON HCL 4 MG/2ML IJ SOLN: 4.0000 mg | Freq: Once | INTRAMUSCULAR | Status: DC | PRN

## 2013-07-18 MED ORDER — GLYCOPYRROLATE 0.2 MG/ML IJ SOLN
INTRAMUSCULAR | Status: DC | PRN
  Administered 2013-07-18: 0.4 mg via INTRAVENOUS

## 2013-07-18 MED ORDER — STERILE WATER FOR IRRIGATION IR SOLN
Status: DC | PRN
  Administered 2013-07-18: 08:00:00

## 2013-07-18 MED ORDER — MIDAZOLAM HCL 2 MG/2ML IJ SOLN
1.0000 mg | INTRAMUSCULAR | Status: DC | PRN
  Administered 2013-07-18: 2 mg via INTRAVENOUS

## 2013-07-18 MED ORDER — ONDANSETRON HCL 4 MG/2ML IJ SOLN
INTRAMUSCULAR | Status: AC
  Filled 2013-07-18: qty 2

## 2013-07-18 MED ORDER — FENTANYL CITRATE 0.05 MG/ML IJ SOLN
INTRAMUSCULAR | Status: DC | PRN
  Administered 2013-07-18 (×2): 50 ug via INTRAVENOUS

## 2013-07-18 MED ORDER — LACTATED RINGERS IV SOLN
INTRAVENOUS | Status: DC
  Administered 2013-07-18: 07:00:00 via INTRAVENOUS

## 2013-07-18 MED ORDER — ONDANSETRON HCL 4 MG/2ML IJ SOLN
4.0000 mg | Freq: Once | INTRAMUSCULAR | Status: AC
  Administered 2013-07-18: 4 mg via INTRAVENOUS

## 2013-07-18 MED ORDER — SUCCINYLCHOLINE CHLORIDE 20 MG/ML IJ SOLN
INTRAMUSCULAR | Status: DC | PRN
  Administered 2013-07-18: 140 mg via INTRAVENOUS

## 2013-07-18 MED ORDER — DEXAMETHASONE SODIUM PHOSPHATE 4 MG/ML IJ SOLN
INTRAMUSCULAR | Status: AC
  Filled 2013-07-18: qty 1

## 2013-07-18 SURGICAL SUPPLY — 40 items
BAG HAMPER (MISCELLANEOUS) ×2 IMPLANT
BALLN RETRIEVAL 12X15 (BALLOONS) ×1 IMPLANT
BALLN RETRIEVAL 9-12 (BALLOONS) ×1 IMPLANT
BALN RTRVL 200 6-7FR 12-15 (BALLOONS) ×1
BALN RTRVL 6-7FR 9-12 BLW INJ (BALLOONS) ×1
BASKET TRAPEZOID 3X6 (MISCELLANEOUS) IMPLANT
BRUSH CYTOL RX .035 2.1X8X200 (MISCELLANEOUS) ×2 IMPLANT
BSKT STON RTRVL TRAPEZOID 3X6 (MISCELLANEOUS)
CATH ACCESS DELIVERY (CATHETERS) ×2 IMPLANT
DEVICE INFLATION ENCORE 26 (MISCELLANEOUS) IMPLANT
DEVICE LOCKING W-BIOPSY CAP (MISCELLANEOUS) ×2 IMPLANT
FIBER LASER SLIMLINE GI 365 (MISCELLANEOUS) ×2 IMPLANT
FORCEPS BIOP RJ4 240 HOT (CUTTING FORCEPS) IMPLANT
FORCEPS BIOP SPYBITE 1.2X286 (FORCEP) ×2 IMPLANT
GUIDEWIRE HYDRA JAGWIRE .35 (WIRE) IMPLANT
GUIDEWIRE JAG HINI 025X260CM (WIRE) IMPLANT
KIT CLEAN ENDO COMPLIANCE (KITS) ×2 IMPLANT
KIT ROOM TURNOVER APOR (KITS) ×2 IMPLANT
LUBRICANT JELLY 4.5OZ STERILE (MISCELLANEOUS) ×2 IMPLANT
NDL HYPO 18GX1.5 BLUNT FILL (NEEDLE) ×1 IMPLANT
NEEDLE HYPO 18GX1.5 BLUNT FILL (NEEDLE) ×2 IMPLANT
NS IRRIG 500ML POUR BTL (IV SOLUTION) IMPLANT
PAD ARMBOARD 7.5X6 YLW CONV (MISCELLANEOUS) ×2 IMPLANT
PATHFINDER 450CM 0.18 (STENTS) IMPLANT
POSITIONER HEAD 8X9X4 ADT (SOFTGOODS) ×1 IMPLANT
PROBE VISUALIZATION SPYGLASS (PROBE) ×2 IMPLANT
SNARE ROTATE MED OVAL 20MM (MISCELLANEOUS) IMPLANT
SNARE SHORT THROW 13M SML OVAL (MISCELLANEOUS) ×2 IMPLANT
SPHINCTEROTOME AUTOTOME .25 (MISCELLANEOUS) ×2 IMPLANT
SPHINCTEROTOME HYDRATOME 44 (MISCELLANEOUS) ×3 IMPLANT
SPONGE GAUZE 4X4 12PLY (GAUZE/BANDAGES/DRESSINGS) ×2 IMPLANT
STENT RX PRELOADED 10FRX5CM (Stent) ×1 IMPLANT
SYR 3ML LL SCALE MARK (SYRINGE) ×2 IMPLANT
SYR 50ML LL SCALE MARK (SYRINGE) ×3 IMPLANT
SYSTEM CONTINUOUS INJECTION (MISCELLANEOUS) ×2 IMPLANT
TUBE IRRIGATION (IRRIGATION / IRRIGATOR) ×2 IMPLANT
TUBING ENDO SMARTCAP PENTAX (MISCELLANEOUS) ×2 IMPLANT
WALLSTENT METAL COVERED 10X60 (STENTS) IMPLANT
WALLSTENT METAL COVERED 10X80 (STENTS) IMPLANT
WATER STERILE IRR 1000ML POUR (IV SOLUTION) ×3 IMPLANT

## 2013-07-18 NOTE — Progress Notes (Signed)
Pt called. C/O difficulty voiding. Very uncomfortable. Dr Karilyn Cotaehman notified. Instructed to have pt return to hospital for straight catheter. Pt notified. Voiced understanding.

## 2013-07-18 NOTE — ED Provider Notes (Signed)
CSN: [id number]     Arrival date & time 07/18/13  1629 History   First MD Initiated Contact with Patient 07/18/13 1745    This chart was scribed for Benny Lennert, MD by Marica Otter, ED Scribe. This patient was seen in room APA14/APA14 and the patient's care was started at 5:51 PM.  Chief Complaint  Patient presents with  . urinary retention    Patient is a 73 y.o. female presenting with general illness. The history is provided by the patient. No language interpreter was used.  Illness Location:  Urinary Retention Severity:  Severe Onset quality:  Unable to specify Timing:  Constant Progression:  Unchanged Chronicity:  New Context:  Unable to urinate following catheter placement this morning due to ERCP Relieved by:  Nothing Worsened by:  Nothing Associated symptoms: no abdominal pain, no chest pain, no congestion, no cough, no diarrhea, no fatigue, no headaches and no rash    HPI Comments: Lindsay Palmer is a 74 y.o. female who presents to the Emergency Department complaining of urinary retention. Pt reports that she had an ERCP this morning with catheter placement. Pt reports that she has been unable to urinate since the surgery.  Past Medical History  Diagnosis Date  . GERD (gastroesophageal reflux disease)   . Bipolar 2 disorder   . Major depressive disorder   . Chronic insomnia   . Non-toxic multinodular goiter   . Osteopenia   . Vitamin D deficiency   . Migraine   . Elevated LFTs   . Diverticulosis of colon   . H/O first degree atrioventricular block   . Fatty liver disease, nonalcoholic   . Hypertension   . Hypercholesteremia   . Sleep apnea     Stop Bang score of 4. Pt said she had sleep apnea at one time, but she has had another sleep study since then and they said she does not have it anymore.   Past Surgical History  Procedure Laterality Date  . Neck surgery      Stenosis C6-C7  . Cholecystectomy    . Cataract surgery Bilateral 08-2012  . Colonoscopy  2003     Diverticulitis h/o polyps -due next 2015 -gastroscopy 2003  . Sigmoidoscopy  2007    no polyps  . Ercp     Family History  Problem Relation Age of Onset  . Diabetes Mother   . Coronary artery disease Father   . Gout Father   . Breast cancer Sister   . Pancreatic cancer Brother   . Healthy Brother   . Lung cancer Sister   . Healthy Sister   . Hyperlipidemia Daughter   . Hyperlipidemia Son   . Healthy Sister   . Healthy Sister    History  Substance Use Topics  . Smoking status: Never Smoker   . Smokeless tobacco: Never Used  . Alcohol Use: No   OB History   Grav Para Term Preterm Abortions TAB SAB Ect Mult Living                 Review of Systems  Constitutional: Negative for appetite change and fatigue.  HENT: Negative for congestion, ear discharge and sinus pressure.   Eyes: Negative for discharge.  Respiratory: Negative for cough.   Cardiovascular: Negative for chest pain.  Gastrointestinal: Negative for abdominal pain and diarrhea.  Genitourinary: Negative for frequency, hematuria and decreased urine volume (urinary retention).  Musculoskeletal: Negative for back pain.  Skin: Negative for rash.  Neurological: Negative for seizures  and headaches.  Psychiatric/Behavioral: Negative for hallucinations.      Allergies  Codeine  Home Medications   Prior to Admission medications   Medication Sig Start Date End Date Taking? Authorizing Provider  amLODipine (NORVASC) 5 MG tablet Take 5 mg by mouth daily.    Historical Provider, MD  ARIPiprazole (ABILIFY) 10 MG tablet Take 10 mg by mouth at bedtime.    Historical Provider, MD  calcium carbonate (TUMS - DOSED IN MG ELEMENTAL CALCIUM) 500 MG chewable tablet Chew 1 tablet by mouth 2 (two) times daily.    Historical Provider, MD  Cholecalciferol (VITAMIN D) 400 UNITS capsule Take 400 Units by mouth daily.    Historical Provider, MD  dicyclomine (BENTYL) 10 MG capsule Take 1 capsule (10 mg total) by mouth 3 (three)  times daily as needed for spasms. 07/08/13   Malissa HippoNajeeb U Rehman, MD  Fish Oil-Cholecalciferol (FISH OIL + D3 PO) Take 1,200 mg by mouth daily.    Historical Provider, MD  furosemide (LASIX) 20 MG tablet Take 20 mg by mouth daily.    Historical Provider, MD  lamoTRIgine (LAMICTAL) 200 MG tablet Take 200 mg by mouth at bedtime.    Historical Provider, MD  losartan (COZAAR) 100 MG tablet Take 100 mg by mouth daily.    Historical Provider, MD  metoprolol tartrate (LOPRESSOR) 25 MG tablet Take 12.5 mg by mouth 2 (two) times daily. Patient takes 1/2 tablet twice a day    Historical Provider, MD  Potassium Chloride (KLOR-CON PO) Take 20 mEq by mouth daily.    Historical Provider, MD  simvastatin (ZOCOR) 20 MG tablet Take 20 mg by mouth daily. In the afternoon    Historical Provider, MD  vitamin B-12 (CYANOCOBALAMIN) 1000 MCG tablet Take 1,000 mcg by mouth daily.    Historical Provider, MD  zolpidem (AMBIEN) 10 MG tablet Take 10 mg by mouth at bedtime.    Historical Provider, MD   Triage Vitals: BP 149/60  Pulse 70  Temp(Src) 98 F (36.7 C) (Oral)  Resp 18  Ht 5\' 4"  (1.626 m)  Wt 171 lb (77.565 kg)  BMI 29.34 kg/m2  SpO2 98% Physical Exam  Constitutional: She is oriented to person, place, and time. She appears well-developed.  HENT:  Head: Normocephalic.  Eyes: Conjunctivae and EOM are normal. No scleral icterus.  Neck: Neck supple. No thyromegaly present.  Cardiovascular: Normal rate and regular rhythm.  Exam reveals no gallop and no friction rub.   No murmur heard. Pulmonary/Chest: No stridor. She has no wheezes. She has no rales. She exhibits no tenderness.  Abdominal: She exhibits no distension. There is no tenderness. There is no rebound.  Musculoskeletal: Normal range of motion. She exhibits no edema.  Lymphadenopathy:    She has no cervical adenopathy.  Neurological: She is oriented to person, place, and time. She exhibits normal muscle tone. Coordination normal.  Skin: No rash noted. No  erythema.  Psychiatric: She has a normal mood and affect. Her behavior is normal.    ED Course  Procedures (including critical care time) DIAGNOSTIC STUDIES: Oxygen Saturation is 98% on RA, normal by my interpretation.    COORDINATION OF CARE: 5:52 PM-Discussed treatment plan which includes catheter placement until Sunday or Monday when the catheter will be taken out with pt at bedside and pt agreed to plan. Pt is advised to f/u with her PCP or return to the ED for catheter to be taken out.   Labs Review Labs Reviewed - No data to display  Imaging Review Dg Ercp With Sphincterotomy  07/18/2013   CLINICAL DATA:  ERCP, sphincterotomy, balloon dilatation and stent placement  EXAM: ERCP  TECHNIQUE: Multiple spot images obtained with the fluoroscopic device and submitted for interpretation post-procedure.  COMPARISON:  None.  FINDINGS: A total of 6 intraoperative spot images demonstrate first cannulation of the pancreatic duct with pancreatic ductogram. No stenosis, stricture, focal dilatation or stone. Subsequently, the common bile duct is cannulated and a wire advanced the right intrahepatic biliary radicles. Sphincterotomy and balloon sweeping of the biliary tree was then performed. The final images demonstrate placement of a plastic biliary stent extending from the common bile duct into the duodenum.  IMPRESSION: ERCP, sphincterotomy, balloon sweeping and a plastic biliary stent placement as above.  These images were submitted for radiologic interpretation only. Please see the procedural report for the amount of contrast and the fluoroscopy time utilized.   Electronically Signed   By: Malachy MoanHeath  McCullough M.D.   On: 07/18/2013 09:27     EKG Interpretation None      MDM   Final diagnoses:  None    The chart was scribed for me under my direct supervision.  I personally performed the history, physical, and medical decision making and all procedures in the evaluation of this  patient.Benny Lennert.    Joseph L Zammit, MD 07/18/13 [id number]

## 2013-07-18 NOTE — Discharge Instructions (Signed)
No aspirin or NSAIDs for 3 days. Resume other medications as before. Clear liquids today and usual diet starting in a.m. No driving for 24 hours. Physician will call with results of brush cytology from bile duct. Office visit in 8 weeks.   Endoscopic Retrograde Cholangiopancreatography (ERCP), Care After Refer to this sheet in the next few weeks. These instructions provide you with information on caring for yourself after your procedure. Your health care provider may also give you more specific instructions. Your treatment has been planned according to current medical practices, but problems sometimes occur. Call your health care provider if you have any problems or questions after your procedure.  WHAT TO EXPECT AFTER THE PROCEDURE  After your procedure, it is typical to feel:   Soreness in your throat.   Sick to your stomach (nauseous).   Bloated.  Dizzy.   Fatigued. HOME CARE INSTRUCTIONS  Have a friend or family member stay with you for the first 24 hours after your procedure.  Start taking your usual medicines and eating normally as soon as you feel well enough to do so or as directed by your health care provider. SEEK MEDICAL CARE IF:  You have abdominal pain.   You develop signs of infection, such as:   Chills.   Feeling unwell.  SEEK IMMEDIATE MEDICAL CARE IF:  You have difficulty swallowing.  You have worsening throat, chest, or abdominal pain.  You vomit.  You have bloody or very black stools.  You have a fever. Document Released: 11/06/2012 Document Reviewed: 11/06/2012 [facility] Patient Information 2015 [place], [place]. This information is not intended to replace advice given to you by your health care provider. Make sure you discuss any questions you have with your health care provider.

## 2013-07-18 NOTE — Progress Notes (Signed)
Dr Karilyn Cotaehman at bedside to talk with pt. rx for vicodin given.

## 2013-07-18 NOTE — Progress Notes (Signed)
Dr Karilyn Cotaehman notified of results. Orders given.

## 2013-07-18 NOTE — ED Notes (Signed)
Pt reports had an ERCP this morning aroun 0700.  Reports went home but was unable to void.  Says called back to surgery center and was told to come back for in and out cath.  Reports since the cath has not been able to void.  Pt reports feels like has to void but can't.

## 2013-07-18 NOTE — Transfer of Care (Signed)
Immediate Anesthesia Transfer of Care Note  Patient: Romero LinerHelen Kroeker  Procedure(s) Performed: Procedure(s): ENDOSCOPIC RETROGRADE CHOLANGIOPANCREATOGRAPHY (ERCP) COMMON BILE DUCT BRUSHING (N/A) SPHINCTEROTOMY (N/A) BILIARY STENT PLACEMENT (N/A)  Patient Location: PACU  Anesthesia Type:General  Level of Consciousness: awake, alert  and patient cooperative  Airway & Oxygen Therapy: Patient Spontanous Breathing and Patient connected to face mask oxygen  Post-op Assessment: Report given to PACU RN, Post -op Vital signs reviewed and stable and Patient moving all extremities  Post vital signs: Reviewed and stable  Complications: No apparent anesthesia complications

## 2013-07-18 NOTE — Progress Notes (Signed)
Awake. Dressed self. States bladder discomfort has decreased. D/C to home in good condition.

## 2013-07-18 NOTE — ED Notes (Signed)
Urinary bag changed to leg bag.

## 2013-07-18 NOTE — Anesthesia Preprocedure Evaluation (Addendum)
Anesthesia Evaluation  Patient identified by MRN, date of birth, ID band Patient awake    Reviewed: Allergy & Precautions, H&P , NPO status , Patient's Chart, lab work & pertinent test results  Airway Mallampati: III TM Distance: <3 FB Neck ROM: Full  Mouth opening: Limited Mouth Opening  Dental  (+) Teeth Intact   Pulmonary sleep apnea ,  breath sounds clear to auscultation        Cardiovascular hypertension, Pt. on medications Rhythm:Regular Rate:Normal     Neuro/Psych  Headaches, PSYCHIATRIC DISORDERS Depression Bipolar Disorder    GI/Hepatic GERD-  Medicated and Controlled,  Endo/Other  goiter  Renal/GU      Musculoskeletal   Abdominal   Peds  Hematology   Anesthesia Other Findings   Reproductive/Obstetrics                          Anesthesia Physical Anesthesia Plan  ASA: III  Anesthesia Plan:    Post-op Pain Management:    Induction: Intravenous  Airway Management Planned: Oral ETT and Video Laryngoscope Planned  Additional Equipment:   Intra-op Plan:   Post-operative Plan: Extubation in OR  Informed Consent: I have reviewed the patients History and Physical, chart, labs and discussed the procedure including the risks, benefits and alternatives for the proposed anesthesia with the patient or authorized representative who has indicated his/her understanding and acceptance.     Plan Discussed with:   Anesthesia Plan Comments:         Anesthesia Quick Evaluation

## 2013-07-18 NOTE — Progress Notes (Signed)
Pt here. Placed on stretcher in preop 3. Straight catheter inserted using sterile technique. 240 ml of clear golden urine. Tolerated well.

## 2013-07-18 NOTE — Discharge Instructions (Signed)
Follow up with your md monday

## 2013-07-18 NOTE — Anesthesia Postprocedure Evaluation (Signed)
  Anesthesia Post-op Note  Patient: Romero LinerHelen Sorrels  Procedure(s) Performed: Procedure(s): ENDOSCOPIC RETROGRADE CHOLANGIOPANCREATOGRAPHY (ERCP) COMMON BILE DUCT BRUSHING (N/A) SPHINCTEROTOMY (N/A) BILIARY STENT PLACEMENT (N/A)  Patient Location: PACU  Anesthesia Type:General  Level of Consciousness: awake, alert , oriented and patient cooperative  Airway and Oxygen Therapy: Patient Spontanous Breathing  Post-op Pain: none  Post-op Assessment: Post-op Vital signs reviewed, Patient's Cardiovascular Status Stable, Respiratory Function Stable and No signs of Nausea or vomiting  Post-op Vital Signs: Reviewed and stable  Last Vitals:  Filed Vitals:   07/18/13 0730  BP: 123/63  Temp:   Resp: 24    Complications: No apparent anesthesia complications

## 2013-07-18 NOTE — Anesthesia Procedure Notes (Signed)
Procedure Name: Intubation Date/Time: 07/18/2013 7:44 AM Performed by: Despina HiddenIDACAVAGE, ROBERT J Pre-anesthesia Checklist: Emergency Drugs available, Suction available, Patient being monitored and Patient identified Patient Re-evaluated:Patient Re-evaluated prior to inductionOxygen Delivery Method: Circle system utilized Preoxygenation: Pre-oxygenation with 100% oxygen Intubation Type: IV induction and Rapid sequence Ventilation: Mask ventilation without difficulty Grade View: Grade III Tube type: Oral Tube size: 7.0 mm Number of attempts: 1 Airway Equipment and Method: Stylet and Video-laryngoscopy Placement Confirmation: ETT inserted through vocal cords under direct vision,  positive ETCO2 and breath sounds checked- equal and bilateral Secured at: 20 cm Tube secured with: Tape Dental Injury: Teeth and Oropharynx as per pre-operative assessment  Difficulty Due To: Difficulty was anticipated and Difficult Airway- due to limited oral opening Future Recommendations: Recommend- induction with short-acting agent, and alternative techniques readily available Comments: Goiter present as well as small oral opening.

## 2013-07-18 NOTE — H&P (Signed)
Lindsay Palmer is an 73 y.o. female.   Chief Complaint: Patient is here for ERCP. HPI: Patient is 73 year old Caucasian female who was history of episodic nausea right upper quadrant pain as well as midabdominal pain and was evaluated by Dr. Drue Stager of [place], [place]. She was noted to have mildly elevated AST and fluctuating alkaline phosphatase levels. RCP revealed mildly dilated bile duct with distal stricture. She was therefore referred for ERCP. She denies vomiting fever or chills or history of jaundice. Patient has had cholecystectomy. She does not recall that she had any problems following surgery.  I have reviewed MRCP with Dr. Lavonia Dana. This study shows mildly dilated biliary system and distal CBD stricture extending down to ampulla. Pancreatic duct appears to be unremarkable. Lab studies from yesterday revealed AST of 22 and ALT of 21.   Past Medical History  Diagnosis Date  . GERD (gastroesophageal reflux disease)   . Bipolar 2 disorder   . Major depressive disorder   . Chronic insomnia   . Non-toxic multinodular goiter   . Osteopenia   . Vitamin D deficiency   . Migraine   . Elevated LFTs   . Diverticulosis of colon   . H/O first degree atrioventricular block   . Fatty liver disease, nonalcoholic   . Hypertension   . Hypercholesteremia   . Sleep apnea     Stop Bang score of 4. Pt said she had sleep apnea at one time, but she has had another sleep study since then and they said she does not have it anymore.    Past Surgical History  Procedure Laterality Date  . Neck surgery      Stenosis C6-C7  . Cholecystectomy    . Cataract surgery Bilateral 08-2012  . Colonoscopy  2003    Diverticulitis h/o polyps -due next 2015 -gastroscopy 2003  . Sigmoidoscopy  2007    no polyps    Family History  Problem Relation Age of Onset  . Diabetes Mother   . Coronary artery disease Father   . Gout Father   . Breast cancer Sister   . Pancreatic cancer Brother   .  Healthy Brother   . Lung cancer Sister   . Healthy Sister   . Hyperlipidemia Daughter   . Hyperlipidemia Son   . Healthy Sister   . Healthy Sister    Social History:  reports that she has never smoked. She has never used smokeless tobacco. She reports that she does not drink alcohol or use illicit drugs.  Allergies:  Allergies  Allergen Reactions  . Codeine Rash    Medications Prior to Admission  Medication Sig Dispense Refill  . amLODipine (NORVASC) 5 MG tablet Take 5 mg by mouth daily.      . ARIPiprazole (ABILIFY) 10 MG tablet Take 10 mg by mouth at bedtime.      Marland Kitchen aspirin EC 81 MG tablet Take 81 mg by mouth daily.      . calcium carbonate (TUMS - DOSED IN MG ELEMENTAL CALCIUM) 500 MG chewable tablet Chew 1 tablet by mouth 2 (two) times daily.      . Cholecalciferol (VITAMIN D) 400 UNITS capsule Take 400 Units by mouth daily.      Marland Kitchen dicyclomine (BENTYL) 10 MG capsule Take 1 capsule (10 mg total) by mouth 3 (three) times daily as needed for spasms.  60 capsule  1  . Fish Oil-Cholecalciferol (FISH OIL + D3 PO) Take 1,200 mg by mouth daily.      Marland Kitchen  furosemide (LASIX) 20 MG tablet Take 20 mg by mouth daily.      Marland Kitchen lamoTRIgine (LAMICTAL) 200 MG tablet Take 200 mg by mouth at bedtime.      Marland Kitchen losartan (COZAAR) 100 MG tablet Take 100 mg by mouth daily.      . meloxicam (MOBIC) 15 MG tablet Take 15 mg by mouth daily.      . metoprolol tartrate (LOPRESSOR) 25 MG tablet Take 12.5 mg by mouth 2 (two) times daily. Patient takes 1/2 tablet twice a day      . Potassium Chloride (KLOR-CON PO) Take 20 mEq by mouth daily.      . simvastatin (ZOCOR) 20 MG tablet Take 20 mg by mouth daily. In the afternoon      . vitamin B-12 (CYANOCOBALAMIN) 1000 MCG tablet Take 1,000 mcg by mouth daily.      Marland Kitchen zolpidem (AMBIEN) 10 MG tablet Take 10 mg by mouth at bedtime.        Results for orders placed during the hospital encounter of 07/17/13 (from the past 48 hour(s))  CBC WITH DIFFERENTIAL     Status: None    Collection Time    07/17/13 10:30 AM      Result Value Ref Range   WBC 5.8  4.0 - 10.5 K/uL   RBC 4.43  3.87 - 5.11 MIL/uL   Hemoglobin 13.9  12.0 - 15.0 g/dL   HCT 41.2  36.0 - 46.0 %   MCV 93.0  78.0 - 100.0 fL   MCH 31.4  26.0 - 34.0 pg   MCHC 33.7  30.0 - 36.0 g/dL   RDW 13.3  11.5 - 15.5 %   Platelets 240  150 - 400 K/uL   Neutrophils Relative % 63  43 - 77 %   Neutro Abs 3.7  1.7 - 7.7 K/uL   Lymphocytes Relative 24  12 - 46 %   Lymphs Abs 1.4  0.7 - 4.0 K/uL   Monocytes Relative 11  3 - 12 %   Monocytes Absolute 0.6  0.1 - 1.0 K/uL   Eosinophils Relative 2  0 - 5 %   Eosinophils Absolute 0.1  0.0 - 0.7 K/uL   Basophils Relative 0  0 - 1 %   Basophils Absolute 0.0  0.0 - 0.1 K/uL  HEPATIC FUNCTION PANEL     Status: None   Collection Time    07/17/13 10:30 AM      Result Value Ref Range   Total Protein 7.5  6.0 - 8.3 g/dL   Albumin 3.9  3.5 - 5.2 g/dL   AST 22  0 - 37 U/L   ALT 21  0 - 35 U/L   Alkaline Phosphatase 110  39 - 117 U/L   Total Bilirubin 0.4  0.3 - 1.2 mg/dL   Bilirubin, Direct <0.2  0.0 - 0.3 mg/dL   Indirect Bilirubin NOT CALCULATED  0.3 - 0.9 mg/dL  BASIC METABOLIC PANEL     Status: Abnormal   Collection Time    07/17/13 10:30 AM      Result Value Ref Range   Sodium 143  137 - 147 mEq/L   Potassium 4.8  3.7 - 5.3 mEq/L   Chloride 102  96 - 112 mEq/L   CO2 28  19 - 32 mEq/L   Glucose, Bld 94  70 - 99 mg/dL   BUN 29 (*) 6 - 23 mg/dL   Creatinine, Ser 1.03  0.50 - 1.10 mg/dL  Calcium 9.9  8.4 - 10.5 mg/dL   GFR calc non Af Amer 53 (*) >90 mL/min   GFR calc Af Amer 61 (*) >90 mL/min   Comment: (NOTE)     The eGFR has been calculated using the CKD EPI equation.     This calculation has not been validated in all clinical situations.     eGFR's persistently <90 mL/min signify possible Chronic Kidney     Disease.  PROTIME-INR     Status: None   Collection Time    07/17/13 10:30 AM      Result Value Ref Range   Prothrombin Time 12.1  11.6 -  15.2 seconds   INR 0.91  0.00 - 1.49   No results found.  ROS  Blood pressure 120/47, temperature 98.2 F (36.8 C), temperature source Oral, resp. rate 23, height $RemoveBe'5\' 4"'XsfpEyYcY$  (1.626 m), weight 170 lb (77.111 kg), SpO2 95.00%. Physical Exam  Constitutional: She appears well-developed and well-nourished.  HENT:  Mouth/Throat: Oropharynx is clear and moist.  Eyes: Conjunctivae are normal. No scleral icterus.  Neck: Normal range of motion. No thyromegaly present.  Cardiovascular: Normal rate, regular rhythm and normal heart sounds.   No murmur heard. Respiratory: Effort normal and breath sounds normal.  GI: Soft. She exhibits no distension and no mass. There is no tenderness.  Musculoskeletal: She exhibits no edema.  Lymphadenopathy:    She has no cervical adenopathy.  Neurological: She is alert.  Skin: Skin is warm and dry.     Assessment/Plan History of right upper quadrant abdominal pain and nausea and mildly elevated ALT and alkaline phosphatase(these are now normal). 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 ERCP with brush cytology of stricture and biliary stenting. I have reviewed procedure with the patient her husband and they're agreeable.  REHMAN,NAJEEB U 07/18/2013, 7:21 AM

## 2013-07-18 NOTE — Op Note (Signed)
ERCP PROCEDURE REPORT  PATIENT:  Lindsay LinerHelen Palmer  MR#:  [id number] Birthdate:  Oct 16, 1940, 73 y.o., female Endoscopist:  Dr. Malissa HippoNajeeb U. Rehman, MD Referred By:  Dr. Halina MaidensJack Spainhour, MD Procedure Date: 07/18/2013  Procedure:   ERCP    Indications:  Patient is 73 year old Caucasian female with intermittent right upper quadrant pain and mildly elevated transaminases who was found to have distal CBD stricture or an MRCP. This evaluation was performed by her gastroenterologist [facility] Hal[facility] [place] and she was referred for diagnostic therapeutic ERCP.           Informed Consent:  The risks, benefits, limitations, alternatives, and mponderable have been reviewed with the patient. I specifically discussed a 1 in 10 chance of pancreatitis, reaction to medications, bleeding, perforation and the possibility of a failed ERCP. Potential for sphincterotomy and stent placement also reviewed. Questions have been answered. All parties agreeable.  Please see history & physical in medical record for more information.  Medications:  *Please see anesthesia record for complete details  Description of procedure:  Procedure performed in the OR. The patient was placed under anesthesia, intubated, and turned into semipermanent position. Therapeutic Pentax video duodenoscope passed through the oropharynx without any difficulty into the esophagus, stomach, and across the pylorus and pull, and descending duodenum.  Cannulation was attempted with RX 44 autotome and 035 hydrojag wire. Dilute contrast was used.  Findings:  Normal mucosa of antrum and pyloric channel. Normal appearing ampulla of Vater. Normal pancreatogram. Dilated CBD and CHD to a maximal diameter of 12 mm with short distal stricture. Distal CBD was filled with contrast distal to balloon. Biliary sphincterotomy performed. Balloon stone extractor was pulled through the duct. Distal CBD stricture was brushed for cytology 10 French 5 cm long  plastic stent was placed across the stricture. Small amount of contrast injected into submucosa during cannulation of bile duct.   Therapeutic/Diagnostic Maneuvers Performed:  See above  Complications:  None  Impression:  Normal pancreatogram. Dilated CBD and CHD with short benign-appearing distal stricture. Stricture was brushed for cytology. 10 French 5 cm long plastic stent placed across the stricture.  Recommendations:  No aspirin or NSAIDs for 72 hours. Clear liquids today and usual diet starting in a.m. I will be contacting patient with results of brush cytology. Office visit in 8 weeks. Repeat ERCP in 3-4 months.  REHMAN,NAJEEB U  07/18/2013  8:54 AM  CC: Dr. [name],[name], MD & Dr. Bonnetta BarryNo ref. provider found CC:  Dr. Halina MaidensJack Spainhour, MD

## 2013-07-19 ENCOUNTER — Inpatient Hospital Stay (HOSPITAL_COMMUNITY)
Admission: EM | Admit: 2013-07-19 | Discharge: 2013-07-31 | DRG: 393 | Disposition: A | Discharge status: Home Health | Payer: Medicare Other | Attending: Internal Medicine | Admitting: Internal Medicine

## 2013-07-19 ENCOUNTER — Emergency Department (HOSPITAL_COMMUNITY): Payer: Medicare Other

## 2013-07-19 ENCOUNTER — Encounter (HOSPITAL_COMMUNITY): Payer: Self-pay | Admitting: Emergency Medicine

## 2013-07-19 DIAGNOSIS — Z801 Family history of malignant neoplasm of trachea, bronchus and lung: Secondary | ICD-10-CM

## 2013-07-19 DIAGNOSIS — R339 Retention of urine, unspecified: Secondary | ICD-10-CM | POA: Diagnosis present

## 2013-07-19 DIAGNOSIS — Z800 Family history of malignant neoplasm of digestive organs: Secondary | ICD-10-CM | POA: Diagnosis not present

## 2013-07-19 DIAGNOSIS — Z8249 Family history of ischemic heart disease and other diseases of the circulatory system: Secondary | ICD-10-CM | POA: Diagnosis not present

## 2013-07-19 DIAGNOSIS — I10 Essential (primary) hypertension: Secondary | ICD-10-CM | POA: Diagnosis present

## 2013-07-19 DIAGNOSIS — K7689 Other specified diseases of liver: Secondary | ICD-10-CM | POA: Diagnosis present

## 2013-07-19 DIAGNOSIS — F329 Major depressive disorder, single episode, unspecified: Secondary | ICD-10-CM | POA: Diagnosis present

## 2013-07-19 DIAGNOSIS — Z6830 Body mass index (BMI) 30.0-30.9, adult: Secondary | ICD-10-CM | POA: Diagnosis not present

## 2013-07-19 DIAGNOSIS — Z803 Family history of malignant neoplasm of breast: Secondary | ICD-10-CM

## 2013-07-19 DIAGNOSIS — D649 Anemia, unspecified: Secondary | ICD-10-CM | POA: Diagnosis present

## 2013-07-19 DIAGNOSIS — K859 Acute pancreatitis without necrosis or infection, unspecified: Secondary | ICD-10-CM | POA: Diagnosis present

## 2013-07-19 DIAGNOSIS — K921 Melena: Secondary | ICD-10-CM | POA: Diagnosis present

## 2013-07-19 DIAGNOSIS — F3189 Other bipolar disorder: Secondary | ICD-10-CM | POA: Diagnosis present

## 2013-07-19 DIAGNOSIS — R21 Rash and other nonspecific skin eruption: Secondary | ICD-10-CM | POA: Diagnosis present

## 2013-07-19 DIAGNOSIS — Y849 Medical procedure, unspecified as the cause of abnormal reaction of the patient, or of later complication, without mention of misadventure at the time of the procedure: Secondary | ICD-10-CM | POA: Diagnosis present

## 2013-07-19 DIAGNOSIS — E8809 Other disorders of plasma-protein metabolism, not elsewhere classified: Secondary | ICD-10-CM | POA: Diagnosis present

## 2013-07-19 DIAGNOSIS — K929 Disease of digestive system, unspecified: Secondary | ICD-10-CM | POA: Diagnosis present

## 2013-07-19 DIAGNOSIS — K851 Biliary acute pancreatitis without necrosis or infection: Secondary | ICD-10-CM

## 2013-07-19 DIAGNOSIS — R1013 Epigastric pain: Secondary | ICD-10-CM | POA: Diagnosis present

## 2013-07-19 DIAGNOSIS — E46 Unspecified protein-calorie malnutrition: Secondary | ICD-10-CM | POA: Diagnosis present

## 2013-07-19 DIAGNOSIS — T50904A Poisoning by unspecified drugs, medicaments and biological substances, undetermined, initial encounter: Secondary | ICD-10-CM

## 2013-07-19 DIAGNOSIS — E7800 Pure hypercholesterolemia, unspecified: Secondary | ICD-10-CM | POA: Diagnosis present

## 2013-07-19 DIAGNOSIS — Z833 Family history of diabetes mellitus: Secondary | ICD-10-CM

## 2013-07-19 DIAGNOSIS — J90 Pleural effusion, not elsewhere classified: Secondary | ICD-10-CM | POA: Diagnosis present

## 2013-07-19 DIAGNOSIS — G4730 Sleep apnea, unspecified: Secondary | ICD-10-CM | POA: Diagnosis present

## 2013-07-19 DIAGNOSIS — Z7982 Long term (current) use of aspirin: Secondary | ICD-10-CM | POA: Diagnosis not present

## 2013-07-19 DIAGNOSIS — K5900 Constipation, unspecified: Secondary | ICD-10-CM | POA: Diagnosis present

## 2013-07-19 DIAGNOSIS — R7989 Other specified abnormal findings of blood chemistry: Secondary | ICD-10-CM | POA: Diagnosis present

## 2013-07-19 DIAGNOSIS — K219 Gastro-esophageal reflux disease without esophagitis: Secondary | ICD-10-CM | POA: Diagnosis present

## 2013-07-19 DIAGNOSIS — E876 Hypokalemia: Secondary | ICD-10-CM | POA: Diagnosis present

## 2013-07-19 DIAGNOSIS — Z79899 Other long term (current) drug therapy: Secondary | ICD-10-CM | POA: Diagnosis not present

## 2013-07-19 DIAGNOSIS — K853 Drug induced acute pancreatitis without necrosis or infection: Secondary | ICD-10-CM | POA: Diagnosis present

## 2013-07-19 LAB — URINALYSIS, ROUTINE W REFLEX MICROSCOPIC
BILIRUBIN URINE: NEGATIVE
Glucose, UA: NEGATIVE mg/dL
KETONES UR: NEGATIVE mg/dL
Leukocytes, UA: NEGATIVE
Nitrite: NEGATIVE
PH: 6 (ref 5.0–8.0)
SPECIFIC GRAVITY, URINE: 1.025 (ref 1.005–1.030)
Urobilinogen, UA: 0.2 mg/dL (ref 0.0–1.0)

## 2013-07-19 LAB — CBC WITH DIFFERENTIAL/PLATELET
Basophils Absolute: 0 10*3/uL (ref 0.0–0.1)
Basophils Relative: 0 % (ref 0–1)
EOS ABS: 0 10*3/uL (ref 0.0–0.7)
Eosinophils Relative: 0 % (ref 0–5)
HCT: 38.3 % (ref 36.0–46.0)
Hemoglobin: 12.9 g/dL (ref 12.0–15.0)
LYMPHS PCT: 8 % — AB (ref 12–46)
Lymphs Abs: 0.8 10*3/uL (ref 0.7–4.0)
MCH: 31.2 pg (ref 26.0–34.0)
MCHC: 33.7 g/dL (ref 30.0–36.0)
MCV: 92.7 fL (ref 78.0–100.0)
Monocytes Absolute: 0.7 10*3/uL (ref 0.1–1.0)
Monocytes Relative: 7 % (ref 3–12)
Neutro Abs: 9.2 10*3/uL — ABNORMAL HIGH (ref 1.7–7.7)
Neutrophils Relative %: 85 % — ABNORMAL HIGH (ref 43–77)
Platelets: 216 10*3/uL (ref 150–400)
RBC: 4.13 MIL/uL (ref 3.87–5.11)
RDW: 13.2 % (ref 11.5–15.5)
WBC: 10.8 10*3/uL — AB (ref 4.0–10.5)

## 2013-07-19 LAB — COMPREHENSIVE METABOLIC PANEL
ALBUMIN: 3.6 g/dL (ref 3.5–5.2)
ALT: 20 U/L (ref 0–35)
AST: 23 U/L (ref 0–37)
Alkaline Phosphatase: 94 U/L (ref 39–117)
BUN: 22 mg/dL (ref 6–23)
CHLORIDE: 98 meq/L (ref 96–112)
CO2: 27 mEq/L (ref 19–32)
Calcium: 9.2 mg/dL (ref 8.4–10.5)
Creatinine, Ser: 0.81 mg/dL (ref 0.50–1.10)
GFR calc Af Amer: 82 mL/min — ABNORMAL LOW (ref >90)
GFR calc non Af Amer: 71 mL/min — ABNORMAL LOW (ref >90)
GLUCOSE: 112 mg/dL — AB (ref 70–99)
POTASSIUM: 4.5 meq/L (ref 3.7–5.3)
Sodium: 137 mEq/L (ref 137–147)
Total Bilirubin: 0.6 mg/dL (ref 0.3–1.2)
Total Protein: 7.3 g/dL (ref 6.0–8.3)

## 2013-07-19 LAB — URINE MICROSCOPIC-ADD ON

## 2013-07-19 LAB — LIPASE, BLOOD: LIPASE: 2885 U/L — AB (ref 11–59)

## 2013-07-19 MED ORDER — HYDROMORPHONE HCL PF 1 MG/ML IJ SOLN
0.5000 mg | INTRAMUSCULAR | Status: DC | PRN
  Administered 2013-07-21 – 2013-07-28 (×18): 0.5 mg via INTRAVENOUS
  Filled 2013-07-19 (×19): qty 1

## 2013-07-19 MED ORDER — SIMVASTATIN 20 MG PO TABS
20.0000 mg | ORAL_TABLET | Freq: Every day | ORAL | Status: DC
  Administered 2013-07-20 – 2013-07-24 (×5): 20 mg via ORAL
  Filled 2013-07-19: qty 1
  Filled 2013-07-19: qty 2
  Filled 2013-07-19 (×3): qty 1
  Filled 2013-07-19: qty 2

## 2013-07-19 MED ORDER — DICYCLOMINE HCL 10 MG PO CAPS: 10.0000 mg | ORAL_CAPSULE | Freq: Three times a day (TID) | ORAL | Status: DC | PRN

## 2013-07-19 MED ORDER — ACETAMINOPHEN 325 MG PO TABS
650.0000 mg | ORAL_TABLET | Freq: Four times a day (QID) | ORAL | Status: DC | PRN
  Administered 2013-07-20 – 2013-07-28 (×4): 650 mg via ORAL
  Filled 2013-07-19 (×4): qty 2

## 2013-07-19 MED ORDER — ONDANSETRON HCL 4 MG/2ML IJ SOLN
4.0000 mg | Freq: Once | INTRAMUSCULAR | Status: AC
  Administered 2013-07-19: 4 mg via INTRAVENOUS
  Filled 2013-07-19: qty 2

## 2013-07-19 MED ORDER — MORPHINE SULFATE 2 MG/ML IJ SOLN
2.0000 mg | Freq: Once | INTRAMUSCULAR | Status: AC
  Administered 2013-07-19: 2 mg via INTRAVENOUS
  Filled 2013-07-19: qty 1

## 2013-07-19 MED ORDER — PANTOPRAZOLE SODIUM 40 MG PO TBEC
40.0000 mg | DELAYED_RELEASE_TABLET | Freq: Every day | ORAL | Status: DC
  Administered 2013-07-19 – 2013-07-23 (×5): 40 mg via ORAL
  Filled 2013-07-19 (×5): qty 1

## 2013-07-19 MED ORDER — ONDANSETRON HCL 4 MG/2ML IJ SOLN
4.0000 mg | Freq: Four times a day (QID) | INTRAMUSCULAR | Status: DC
  Administered 2013-07-19 – 2013-07-20 (×2): 4 mg via INTRAVENOUS
  Filled 2013-07-19 (×3): qty 2

## 2013-07-19 MED ORDER — LAMOTRIGINE 100 MG PO TABS
200.0000 mg | ORAL_TABLET | Freq: Every day | ORAL | Status: DC
  Administered 2013-07-20 – 2013-07-31 (×12): 200 mg via ORAL
  Filled 2013-07-19 (×2): qty 2
  Filled 2013-07-19: qty 1
  Filled 2013-07-19 (×8): qty 2
  Filled 2013-07-19 (×2): qty 1
  Filled 2013-07-19: qty 2

## 2013-07-19 MED ORDER — MORPHINE SULFATE 4 MG/ML IJ SOLN
4.0000 mg | Freq: Once | INTRAMUSCULAR | Status: AC
  Administered 2013-07-19: 4 mg via INTRAVENOUS
  Filled 2013-07-19: qty 1

## 2013-07-19 MED ORDER — VITAMIN B-12 1000 MCG PO TABS
1000.0000 ug | ORAL_TABLET | Freq: Every day | ORAL | Status: DC
  Administered 2013-07-20 – 2013-07-31 (×13): 1000 ug via ORAL
  Filled 2013-07-19 (×12): qty 1

## 2013-07-19 MED ORDER — LOSARTAN POTASSIUM 50 MG PO TABS
100.0000 mg | ORAL_TABLET | Freq: Every day | ORAL | Status: DC
  Administered 2013-07-20 – 2013-07-31 (×12): 100 mg via ORAL
  Filled 2013-07-19 (×12): qty 2

## 2013-07-19 MED ORDER — SODIUM CHLORIDE 0.9 % IV BOLUS (SEPSIS)
500.0000 mL | Freq: Once | INTRAVENOUS | Status: AC
  Administered 2013-07-19: 500 mL via INTRAVENOUS

## 2013-07-19 MED ORDER — ACETAMINOPHEN 650 MG RE SUPP: 650.0000 mg | Freq: Four times a day (QID) | RECTAL | Status: DC | PRN

## 2013-07-19 MED ORDER — ARIPIPRAZOLE 10 MG PO TABS
10.0000 mg | ORAL_TABLET | Freq: Every day | ORAL | Status: DC
  Administered 2013-07-19 – 2013-07-30 (×12): 10 mg via ORAL
  Filled 2013-07-19 (×12): qty 1

## 2013-07-19 MED ORDER — ONDANSETRON HCL 4 MG/2ML IJ SOLN
4.0000 mg | Freq: Four times a day (QID) | INTRAMUSCULAR | Status: DC | PRN
  Administered 2013-07-19: 4 mg via INTRAVENOUS
  Filled 2013-07-19: qty 2

## 2013-07-19 MED ORDER — ZOLPIDEM TARTRATE 5 MG PO TABS
5.0000 mg | ORAL_TABLET | Freq: Every day | ORAL | Status: DC
  Administered 2013-07-19 – 2013-07-30 (×12): 5 mg via ORAL
  Filled 2013-07-19 (×12): qty 1

## 2013-07-19 MED ORDER — MORPHINE SULFATE 2 MG/ML IJ SOLN
2.0000 mg | INTRAMUSCULAR | Status: DC | PRN
  Administered 2013-07-19: 2 mg via INTRAVENOUS
  Filled 2013-07-19: qty 1

## 2013-07-19 MED ORDER — HYDROMORPHONE HCL PF 1 MG/ML IJ SOLN
0.5000 mg | Freq: Four times a day (QID) | INTRAMUSCULAR | Status: DC
  Administered 2013-07-19 – 2013-07-23 (×12): 0.5 mg via INTRAVENOUS
  Filled 2013-07-19 (×13): qty 1

## 2013-07-19 MED ORDER — HYDROMORPHONE HCL 1 MG/ML PO LIQD: 0.5000 mg | Freq: Four times a day (QID) | ORAL | Status: DC

## 2013-07-19 MED ORDER — SODIUM CHLORIDE 0.9 % IV SOLN: INTRAVENOUS | Status: DC

## 2013-07-19 MED ORDER — CALCIUM CARBONATE ANTACID 500 MG PO CHEW
1.0000 | CHEWABLE_TABLET | Freq: Two times a day (BID) | ORAL | Status: DC
  Administered 2013-07-20 – 2013-07-31 (×23): 200 mg via ORAL
  Filled 2013-07-19 (×23): qty 1

## 2013-07-19 MED ORDER — METOPROLOL TARTRATE 25 MG PO TABS
12.5000 mg | ORAL_TABLET | Freq: Two times a day (BID) | ORAL | Status: DC
  Administered 2013-07-19 – 2013-07-24 (×11): 12.5 mg via ORAL
  Administered 2013-07-25: 11:00:00 via ORAL
  Administered 2013-07-26 – 2013-07-31 (×10): 12.5 mg via ORAL
  Filled 2013-07-19 (×23): qty 1

## 2013-07-19 MED ORDER — AMLODIPINE BESYLATE 5 MG PO TABS
5.0000 mg | ORAL_TABLET | Freq: Every day | ORAL | Status: DC
  Administered 2013-07-20 – 2013-07-31 (×12): 5 mg via ORAL
  Filled 2013-07-19 (×12): qty 1

## 2013-07-19 NOTE — ED Notes (Addendum)
Pt states had ERCP yesterday. States now having nausea, vomiting and abd pain. Pt states unable to have BM and pass gas. Pt has current foley in place with leg bag.

## 2013-07-19 NOTE — ED Notes (Addendum)
Pt A&O, NAD, IV 20G right AC in place saline locked. Foley catheter in place. Pt has not had any vomiting in Ed. Pt aware of admission for dx of pancreatitis to med/surg bed 340.

## 2013-07-19 NOTE — Consult Note (Signed)
Referring Provider: No ref. provider found Primary Care Physician:  Alinda Deem, MD Primary Gastroenterologist:  DR. Karilyn Cota Reason for Consultation:  PANCREATITIS   Impression: ADMITTED WITH POST-ERCP PANCREATITIS. C/O NAUSEA, ABDOMINAL PAIN, AND SML VOLUME HEMATOCHEZIA.  Plan: 1. CONTINUE TO MONITOR FOR ACTIVE GI BLEED. SML VOLUME HEMATOCHEZIA NOT UNEXPECTED AFTER SPHINCTEROTOMY. 2. HYDRATE/NPO EXCEPT ICE CHIPS & MEDS 3. CONTROL PAIN/NAUSEA-ZOFRAN/DILAUDID ATC. 4. DISCUSSED THE NATURE OF PANCREATITIS AND MANAGEMENT WITH PT AND FAMILY. 5. BIL SCDs. AVOID ASA/ANTI-COAGULATION DUE TO RECENT SPHINCTEROTOMY. 6. PROTONIX Q1700   HPI:  PT SEEN AND EVALUATED JUN 19 BY DR. Karilyn Cota FOR ELEVATED LFTS. ERCP REVEALED DISTAL BILIARY STRICTURE AND SPHINCTEROTOMY WAS PERFORMED. BRUSHINGS PENDING. STENT LEFT IN PLACE. PANCREATOGRAM PERFORMED. PT RETURNED TO SHORT STAY FOR URINARY RETENTION AND PT D/C WITH LEG BAG IN PLACE.  OVERNIGHT DEVELOPED INCREASING GENERALIZED ABDOMINAL PAIN, NAUSEA, AND VOMITING. ONE EPISODE OF EMESIS CONTAINED BLOOD. NO BRBPR OR MELENA. SUBJECTIVE CHILLS. NO BM IN ONE WEEK. NO WITH PAIN MOST PRONOUNCED ON THE LEFT ABDOMEN AND EPIGASTRIUM. HAD PAIN IN HER THROAT AFTER ERCP.  PT DENIES FEVER, CHEST PAIN, SHORTNESS OF BREATH,  problems swallowing, OR heartburn or indigestion.   Past Medical History  Diagnosis Date  . GERD (gastroesophageal reflux disease)   . Bipolar 2 disorder   . Major depressive disorder   . Chronic insomnia   . Non-toxic multinodular goiter   . Osteopenia   . Vitamin D deficiency   . Migraine   . Elevated LFTs   . Diverticulosis of colon   . H/O first degree atrioventricular block   . Fatty liver disease, nonalcoholic   . Hypertension   . Hypercholesteremia   . Sleep apnea     Stop Bang score of 4. Pt said she had sleep apnea at one time, but she has had another sleep study since then and they said she does not have it anymore.    Past  Surgical History  Procedure Laterality Date  . Neck surgery      Stenosis C6-C7  . Cholecystectomy    . Cataract surgery Bilateral 08-2012  . Colonoscopy  2003    Diverticulitis h/o polyps -due next 2015 -gastroscopy 2003  . Sigmoidoscopy  2007    no polyps  . Ercp      Prior to Admission medications   Medication Sig Start Date End Date Taking? Authorizing Provider  amLODipine (NORVASC) 5 MG tablet Take 5 mg by mouth daily.   Yes Historical Provider, MD  ARIPiprazole (ABILIFY) 10 MG tablet Take 10 mg by mouth at bedtime.   Yes Historical Provider, MD  aspirin EC 81 MG tablet Take 81 mg by mouth daily.   Yes Historical Provider, MD  calcium carbonate (TUMS - DOSED IN MG ELEMENTAL CALCIUM) 500 MG chewable tablet Chew 1 tablet by mouth 2 (two) times daily.   Yes Historical Provider, MD  Cholecalciferol (VITAMIN D) 400 UNITS capsule Take 400 Units by mouth daily.   Yes Historical Provider, MD  dicyclomine (BENTYL) 10 MG capsule Take 1 capsule (10 mg total) by mouth 3 (three) times daily as needed for spasms. 07/08/13  Yes Malissa Hippo, MD  Fish Oil-Cholecalciferol (FISH OIL + D3 PO) Take 1,200 mg by mouth 2 (two) times daily.    Yes Historical Provider, MD  furosemide (LASIX) 20 MG tablet Take 20 mg by mouth daily.   Yes Historical Provider, MD  lamoTRIgine (LAMICTAL) 200 MG tablet Take 200 mg by mouth daily.    Yes Historical Provider, MD  losartan (COZAAR) 100 MG tablet Take 100 mg by mouth daily.   Yes Historical Provider, MD  meloxicam (MOBIC) 15 MG tablet Take 15 mg by mouth daily. 06/29/13  Yes Historical Provider, MD  metoprolol tartrate (LOPRESSOR) 25 MG tablet Take 12.5 mg by mouth 2 (two) times daily. Patient takes 1/2 tablet twice a day   Yes Historical Provider, MD  potassium chloride SA (K-DUR,KLOR-CON) 20 MEQ tablet Take 20 mEq by mouth daily.   Yes Historical Provider, MD  simvastatin (ZOCOR) 20 MG tablet Take 20 mg by mouth daily. In the afternoon   Yes Historical Provider, MD   vitamin B-12 (CYANOCOBALAMIN) 1000 MCG tablet Take 1,000 mcg by mouth daily.   Yes Historical Provider, MD  zolpidem (AMBIEN) 10 MG tablet Take 10 mg by mouth at bedtime.   Yes Historical Provider, MD    Allergies as of 07/19/2013 - Review Complete 07/19/2013  Allergen Reaction Noted  . Codeine Nausea And Vomiting 07/08/2013    Family History  Problem Relation Age of Onset  . Diabetes Mother   . Coronary artery disease Father   . Gout Father   . Breast cancer Sister   . Pancreatic cancer Brother   . Healthy Brother   . Lung cancer Sister   . Healthy Sister   . Hyperlipidemia Daughter   . Hyperlipidemia Son   . Healthy Sister   . Healthy Sister     History   Social History  . Marital Status: Married    Spouse Name: N/A    Number of Children: N/A  . Years of Education: N/A   Social History Main Topics  . Smoking status: Never Smoker   . Smokeless tobacco: Never Used  . Alcohol Use: No  . Drug Use: No  . Sexual Activity: Not on file   Review of Systems: PER HPI OTHERWISE ALL SYSTEMS ARE NEGATIVE.   Vitals: Blood pressure 119/46, pulse 55, temperature 98.7 F (37.1 C), temperature source Oral, resp. rate 19, SpO2 95.00%.  Physical Exam: General:   Alert,  Well-developed, well-nourished, pleasant and cooperative in NAD Head:  Normocephalic and atraumatic. Eyes:  Sclera clear, no icterus.   Conjunctiva pink. Mouth:  No lesions Neck:  Supple;  Lungs:  Clear throughout to auscultation.   No wheezes. No acute distress. Heart:  Regular rate and rhythm; no murmurs. Abdomen:  Soft, nontender and nondistended. No masses noted. Normal bowel sounds, with guarding, and without rebound.   Msk:  Symmetrical without gross deformities. Normal posture. LEG BAG IN PLACE ON R THIGH. Extremities:  Without edema. Neurologic:  Alert and  oriented x4;  grossly normal neurologically. Cervical Nodes:  No significant cervical adenopathy. Psych:  Alert and cooperative. Normal mood and  FLAT affect.  Lab Results:  Recent Labs  07/17/13 1030 07/19/13 0910  WBC 5.8 10.8*  HGB 13.9 12.9  HCT 41.2 38.3  PLT 240 216   BMET  Recent Labs  07/17/13 1030 07/19/13 0910  NA 143 137  K 4.8 4.5  CL 102 98  CO2 28 27  GLUCOSE 94 112*  BUN 29* 22  CREATININE 1.03 0.81  CALCIUM 9.9 9.2   LFT  Recent Labs  07/17/13 1030 07/19/13 0910  PROT 7.5 7.3  ALBUMIN 3.9 3.6  AST 22 23  ALT 21 20  ALKPHOS 110 94  BILITOT 0.4 0.6  BILIDIR <0.2  --   IBILI NOT CALCULATED  --      Studies/Results: [facility] JUN 20: NAIAP MILDLY DILATED LOOPS OF  BOWEL.   LOS: 0 days   Hugh Kamara  07/19/2013, 1:12 PM

## 2013-07-19 NOTE — ED Provider Notes (Signed)
Medical screening examination/treatment/procedure(s) were conducted as a shared visit with non-physician practitioner(s) and myself.  I personally evaluated the patient during the encounter.   EKG Interpretation None     Patient is status post ERCP yesterday. Now with epigastric abdominal pain. Plain films revealed no obstruction. Lipase greater than 2000. Admit for acute pancreatitis.  Donnetta HutchingBrian Rayshaun Needle, MD 07/19/13 1430

## 2013-07-19 NOTE — ED Notes (Signed)
Had ERCP yesterday, c/o abd pain and vomiting x2 this am

## 2013-07-19 NOTE — ED Provider Notes (Signed)
CSN: [id number]     Arrival date & time 07/19/13  0847 History   First MD Initiated Contact with Patient 07/19/13 [phone]     Chief Complaint  Patient presents with  . Abdominal Pain     (Consider location/radiation/quality/duration/timing/severity/associated sxs/prior Treatment) Patient is a 73 y.o. female presenting with abdominal pain. The history is provided by the patient.  Abdominal Pain Pain location:  Generalized Pain quality: bloating and shooting   Pain radiates to:  Does not radiate Pain severity:  Severe (8/10) Onset quality:  Gradual Duration:  24 hours Timing:  Constant Progression:  Worsening Chronicity:  New Context: previous surgery   Relieved by:  Nothing Worsened by:  Movement, vomiting and eating Ineffective treatments:  None tried Associated symptoms: chills, nausea and vomiting   Associated symptoms: no chest pain, no cough, no hematemesis, no shortness of breath and no sore throat  Fever: ?    Sahmya Arai is a 73 y.o. female who presents to the ED with pain s/p ERCP yesterday. She was unable to void yesterday after the procedure so she went back x 2 and the second time they placed a catheter with a leg bag. This morning the patient reports increased abdominal pain with nausea and vomiting.  Past Medical History  Diagnosis Date  . GERD (gastroesophageal reflux disease)   . Bipolar 2 disorder   . Major depressive disorder   . Chronic insomnia   . Non-toxic multinodular goiter   . Osteopenia   . Vitamin D deficiency   . Migraine   . Elevated LFTs   . Diverticulosis of colon   . H/O first degree atrioventricular block   . Fatty liver disease, nonalcoholic   . Hypertension   . Hypercholesteremia   . Sleep apnea     Stop Bang score of 4. Pt said she had sleep apnea at one time, but she has had another sleep study since then and they said she does not have it anymore.   Past Surgical History  Procedure Laterality Date  . Neck surgery      Stenosis  C6-C7  . Cholecystectomy    . Cataract surgery Bilateral 08-2012  . Colonoscopy  2003    Diverticulitis h/o polyps -due next 2015 -gastroscopy 2003  . Sigmoidoscopy  2007    no polyps  . Ercp     Family History  Problem Relation Age of Onset  . Diabetes Mother   . Coronary artery disease Father   . Gout Father   . Breast cancer Sister   . Pancreatic cancer Brother   . Healthy Brother   . Lung cancer Sister   . Healthy Sister   . Hyperlipidemia Daughter   . Hyperlipidemia Son   . Healthy Sister   . Healthy Sister    History  Substance Use Topics  . Smoking status: Never Smoker   . Smokeless tobacco: Never Used  . Alcohol Use: No   OB History   Grav Para Term Preterm Abortions TAB SAB Ect Mult Living                 Review of Systems  Constitutional: Positive for chills. Fever: ?  HENT: Negative.  Negative for sore throat.   Eyes: Negative for visual disturbance.  Respiratory: Negative for cough and shortness of breath.   Cardiovascular: Negative for chest pain.  Gastrointestinal: Positive for nausea, vomiting and abdominal pain. Negative for hematemesis.  Genitourinary: Positive for difficulty urinating.  Musculoskeletal: Positive for back pain.  Skin: Negative for rash.  Neurological: Negative for light-headedness and headaches.  Psychiatric/Behavioral: Negative for confusion. The patient is not nervous/anxious.       Allergies  Codeine  Home Medications   Prior to Admission medications   Medication Sig Start Date End Date Taking? Authorizing Jesten Cappuccio  amLODipine (NORVASC) 5 MG tablet Take 5 mg by mouth daily.   Yes Historical Annabella Elford, MD  ARIPiprazole (ABILIFY) 10 MG tablet Take 10 mg by mouth at bedtime.   Yes Historical Sofie Schendel, MD  aspirin EC 81 MG tablet Take 81 mg by mouth daily.   Yes Historical Anjoli Diemer, MD  calcium carbonate (TUMS - DOSED IN MG ELEMENTAL CALCIUM) 500 MG chewable tablet Chew 1 tablet by mouth 2 (two) times daily.   Yes Historical  Ryanna Teschner, MD  Cholecalciferol (VITAMIN D) 400 UNITS capsule Take 400 Units by mouth daily.   Yes Historical Glorian Mcdonell, MD  dicyclomine (BENTYL) 10 MG capsule Take 1 capsule (10 mg total) by mouth 3 (three) times daily as needed for spasms. 07/08/13  Yes Malissa HippoNajeeb U Rehman, MD  Fish Oil-Cholecalciferol (FISH OIL + D3 PO) Take 1,200 mg by mouth 2 (two) times daily.    Yes Historical Jatorian Renault, MD  furosemide (LASIX) 20 MG tablet Take 20 mg by mouth daily.   Yes Historical Oluwatoyin Banales, MD  lamoTRIgine (LAMICTAL) 200 MG tablet Take 200 mg by mouth daily.    Yes Historical Keonta Alsip, MD  losartan (COZAAR) 100 MG tablet Take 100 mg by mouth daily.   Yes Historical Elianys Conry, MD  meloxicam (MOBIC) 15 MG tablet Take 15 mg by mouth daily. 06/29/13  Yes Historical Barbera Perritt, MD  metoprolol tartrate (LOPRESSOR) 25 MG tablet Take 12.5 mg by mouth 2 (two) times daily. Patient takes 1/2 tablet twice a day   Yes Historical Amiylah Anastos, MD  potassium chloride SA (K-DUR,KLOR-CON) 20 MEQ tablet Take 20 mEq by mouth daily.   Yes Historical Chanin Frumkin, MD  simvastatin (ZOCOR) 20 MG tablet Take 20 mg by mouth daily. In the afternoon   Yes Historical Breion Novacek, MD  vitamin B-12 (CYANOCOBALAMIN) 1000 MCG tablet Take 1,000 mcg by mouth daily.   Yes Historical Jourden Gilson, MD  zolpidem (AMBIEN) 10 MG tablet Take 10 mg by mouth at bedtime.   Yes Historical Inessa Wardrop, MD   BP 148/91  Pulse 55  Temp(Src) 98.7 F (37.1 C) (Oral)  Resp 19  SpO2 95% Physical Exam  Nursing note and vitals reviewed. Constitutional: She is oriented to person, place, and time. She appears well-developed and well-nourished. No distress.  HENT:  Head: Normocephalic and atraumatic.  Eyes: Conjunctivae and EOM are normal.  Neck: Normal range of motion. Neck supple.  Cardiovascular: Regular rhythm.  Bradycardia present.   Pulmonary/Chest: Effort normal. She has no wheezes. She has no rales.  Abdominal: Bowel sounds are normal. There is generalized tenderness. There  is guarding. There is no rebound and no CVA tenderness.  Patient with distended abdomen that is firm and tender on palpation.   Musculoskeletal: Normal range of motion.  Neurological: She is alert and oriented to person, place, and time. No cranial nerve deficit.  Skin: Skin is warm and dry.  Psychiatric: She has a normal mood and affect. Her behavior is normal.    ED Course  Procedures (including critical care time) IV, labs, x-ray, consult with GI Labs Review Results for orders placed during the hospital encounter of 07/19/13 (from the past 24 hour(s))  COMPREHENSIVE METABOLIC PANEL     Status: Abnormal   Collection Time  07/19/13  9:10 AM      Result Value Ref Range   Sodium 137  137 - 147 mEq/L   Potassium 4.5  3.7 - 5.3 mEq/L   Chloride 98  96 - 112 mEq/L   CO2 27  19 - 32 mEq/L   Glucose, Bld 112 (*) 70 - 99 mg/dL   BUN 22  6 - 23 mg/dL   Creatinine, Ser 0.860.81  0.50 - 1.10 mg/dL   Calcium 9.2  8.4 - 57.810.5 mg/dL   Total Protein 7.3  6.0 - 8.3 g/dL   Albumin 3.6  3.5 - 5.2 g/dL   AST 23  0 - 37 U/L   ALT 20  0 - 35 U/L   Alkaline Phosphatase 94  39 - 117 U/L   Total Bilirubin 0.6  0.3 - 1.2 mg/dL   GFR calc non Af Amer 71 (*) >90 mL/min   GFR calc Af Amer 82 (*) >90 mL/min  CBC WITH DIFFERENTIAL     Status: Abnormal   Collection Time    07/19/13  9:10 AM      Result Value Ref Range   WBC 10.8 (*) 4.0 - 10.5 K/uL   RBC 4.13  3.87 - 5.11 MIL/uL   Hemoglobin 12.9  12.0 - 15.0 g/dL   HCT 46.938.3  62.936.0 - 52.846.0 %   MCV 92.7  78.0 - 100.0 fL   MCH 31.2  26.0 - 34.0 pg   MCHC 33.7  30.0 - 36.0 g/dL   RDW 41.313.2  24.411.5 - 01.015.5 %   Platelets 216  150 - 400 K/uL   Neutrophils Relative % 85 (*) 43 - 77 %   Neutro Abs 9.2 (*) 1.7 - 7.7 K/uL   Lymphocytes Relative 8 (*) 12 - 46 %   Lymphs Abs 0.8  0.7 - 4.0 K/uL   Monocytes Relative 7  3 - 12 %   Monocytes Absolute 0.7  0.1 - 1.0 K/uL   Eosinophils Relative 0  0 - 5 %   Eosinophils Absolute 0.0  0.0 - 0.7 K/uL   Basophils Relative  0  0 - 1 %   Basophils Absolute 0.0  0.0 - 0.1 K/uL  LIPASE, BLOOD     Status: Abnormal   Collection Time    07/19/13  9:10 AM      Result Value Ref Range   Lipase 2885 (*) 11 - 59 U/L  URINALYSIS, ROUTINE W REFLEX MICROSCOPIC     Status: Abnormal   Collection Time    07/19/13 10:48 AM      Result Value Ref Range   Color, Urine YELLOW  YELLOW   APPearance CLEAR  CLEAR   Specific Gravity, Urine 1.025  1.005 - 1.030   pH 6.0  5.0 - 8.0   Glucose, UA NEGATIVE  NEGATIVE mg/dL   Hgb urine dipstick LARGE (*) NEGATIVE   Bilirubin Urine NEGATIVE  NEGATIVE   Ketones, ur NEGATIVE  NEGATIVE mg/dL   Protein, ur TRACE (*) NEGATIVE mg/dL   Urobilinogen, UA 0.2  0.0 - 1.0 mg/dL   Nitrite NEGATIVE  NEGATIVE   Leukocytes, UA NEGATIVE  NEGATIVE  URINE MICROSCOPIC-ADD ON     Status: Abnormal   Collection Time    07/19/13 10:48 AM      Result Value Ref Range   Squamous Epithelial / LPF FEW (*) RARE   WBC, UA 0-2  <3 WBC/hpf   RBC / HPF 11-20  <3 RBC/hpf   Bacteria, UA FEW (*)  RARE    Imaging Review Dg Ercp With Sphincterotomy  07/18/2013   CLINICAL DATA:  ERCP, sphincterotomy, balloon dilatation and stent placement  EXAM: ERCP  TECHNIQUE: Multiple spot images obtained with the fluoroscopic device and submitted for interpretation post-procedure.  COMPARISON:  None.  FINDINGS: A total of 6 intraoperative spot images demonstrate first cannulation of the pancreatic duct with pancreatic ductogram. No stenosis, stricture, focal dilatation or stone. Subsequently, the common bile duct is cannulated and a wire advanced the right intrahepatic biliary radicles. Sphincterotomy and balloon sweeping of the biliary tree was then performed. The final images demonstrate placement of a plastic biliary stent extending from the common bile duct into the duodenum.  IMPRESSION: ERCP, sphincterotomy, balloon sweeping and a plastic biliary stent placement as above.  These images were submitted for radiologic interpretation  only. Please see the procedural report for the amount of contrast and the fluoroscopy time utilized.   Electronically Signed   By: Malachy Moan M.D.   On: 07/18/2013 09:27   Dg Abd Acute W/chest  07/19/2013   CLINICAL DATA:  Right upper quadrant abdominal pain  EXAM: ACUTE ABDOMEN SERIES (ABDOMEN 2 VIEW & CHEST 1 VIEW)  COMPARISON:  Recent prior ERCP 07/18/2013  FINDINGS: Mild dependent atelectasis in the lower lobes. Cardiac and mediastinal contours are within normal limits. Atherosclerotic calcification present in the transverse aorta. No focal consolidation, pleural effusion or pneumothorax. Incompletely imaged anterior cervical stabilization hardware.  No evidence of bowel obstruction. A plastic biliary stent projects over the right upper quadrant in the expected location. There is associated pneumobilia. Surgical clips suggest prior cholecystectomy. No evidence of bowel obstruction, free air or ascites. No organomegaly or suspicious calcification. Multilevel degenerative spurring throughout the lumbar spine. No acute osseous abnormality.  IMPRESSION: 1. Nonobstructed bowel gas pattern. 2. No evidence for free air. 3. Plastic biliary stent with expected pneumobilia.   Electronically Signed   By: Malachy Moan M.D.   On: 07/19/2013 10:04     MDM  73 y.o. female with abdominal pain s/p ERCP. Dr. Darrick Penna consulted and hospitalist to admit. Patient agrees with plan.  Final diagnoses:  Acute biliary pancreatitis       Janne Napoleon, NP 07/19/13 1121

## 2013-07-19 NOTE — Progress Notes (Signed)
Utilization review Completed Kimberly Welborn RN BSN   

## 2013-07-19 NOTE — ED Provider Notes (Signed)
Medical screening examination/treatment/procedure(s) were conducted as a shared visit with non-physician practitioner(s) and myself.  I personally evaluated the patient during the encounter.   EKG Interpretation None     Status post ERCP yesterday complicated with urinary retention last night. Patient now presents with persistent epigastric pain and distended abdomen. Acute abdominal series reveals no obstruction. Lipase greater than 2000. Admit for pancreatitis. Discussed with Dr. Audie ClearMemon  Brian Cook, MD 07/19/13 1049

## 2013-07-19 NOTE — H&P (Signed)
[facility] History and Physical  Lindsay LinerHelen Tabor ZOX:[id number] DOB: 11/02/1940 DOA: 07/19/2013  Referring physician: Dr. Adriana [place], ER physician PCP: [name],[name], MD   Chief Complaint: Abdominal pain  HPI: Lindsay Palmer is a 73 y.o. female recently underwent an ERCP on 6/19 her elevated liver function tests and a biliary stent placed. After returning home yesterday, patient developed epigastric abdominal pain. Her pain continued to get worse overnight. This morning she developed nausea, and had one episode of vomiting which contained blood. She reported to the emergency room for evaluation where she was noted to have an elevated lipase consistent with pancreatitis. She denies any fever, cough, shortness of breath. She was noted to have urinary retention yesterday and had a Foley catheter place with leg bag. She reports having urinary retention in the past after having surgery. She'll be admitted to the hospital for treatment of pancreatitis   Review of Systems:  Pertinent positives as per HPI, otherwise negative  Past Medical History  Diagnosis Date  . GERD (gastroesophageal reflux disease)   . Bipolar 2 disorder   . Major depressive disorder   . Chronic insomnia   . Non-toxic multinodular goiter   . Osteopenia   . Vitamin D deficiency   . Migraine   . Elevated LFTs   . Diverticulosis of colon   . H/O first degree atrioventricular block   . Fatty liver disease, nonalcoholic   . Hypertension   . Hypercholesteremia   . Sleep apnea     Stop Bang score of 4. Pt said she had sleep apnea at one time, but she has had another sleep study since then and they said she does not have it anymore.   Past Surgical History  Procedure Laterality Date  . Neck surgery      Stenosis C6-C7  . Cholecystectomy    . Cataract surgery Bilateral 08-2012  . Colonoscopy  2003    Diverticulitis h/o polyps -due next 2015 -gastroscopy 2003  . Sigmoidoscopy  2007    no polyps  . Ercp     Social  History:  reports that she has never smoked. She has never used smokeless tobacco. She reports that she does not drink alcohol or use illicit drugs.  Allergies  Allergen Reactions  . Codeine Nausea And Vomiting    Family History  Problem Relation Age of Onset  . Diabetes Mother   . Coronary artery disease Father   . Gout Father   . Breast cancer Sister   . Pancreatic cancer Brother   . Healthy Brother   . Lung cancer Sister   . Healthy Sister   . Hyperlipidemia Daughter   . Hyperlipidemia Son   . Healthy Sister   . Healthy Sister      Prior to Admission medications   Medication Sig Start Date End Date Taking? Authorizing Provider  amLODipine (NORVASC) 5 MG tablet Take 5 mg by mouth daily.   Yes Historical Provider, MD  ARIPiprazole (ABILIFY) 10 MG tablet Take 10 mg by mouth at bedtime.   Yes Historical Provider, MD  aspirin EC 81 MG tablet Take 81 mg by mouth daily.   Yes Historical Provider, MD  calcium carbonate (TUMS - DOSED IN MG ELEMENTAL CALCIUM) 500 MG chewable tablet Chew 1 tablet by mouth 2 (two) times daily.   Yes Historical Provider, MD  Cholecalciferol (VITAMIN D) 400 UNITS capsule Take 400 Units by mouth daily.   Yes Historical Provider, MD  dicyclomine (BENTYL) 10 MG capsule Take 1 capsule (10 mg  total) by mouth 3 (three) times daily as needed for spasms. 07/08/13  Yes Malissa HippoNajeeb U Rehman, MD  Fish Oil-Cholecalciferol (FISH OIL + D3 PO) Take 1,200 mg by mouth 2 (two) times daily.    Yes Historical Provider, MD  furosemide (LASIX) 20 MG tablet Take 20 mg by mouth daily.   Yes Historical Provider, MD  lamoTRIgine (LAMICTAL) 200 MG tablet Take 200 mg by mouth daily.    Yes Historical Provider, MD  losartan (COZAAR) 100 MG tablet Take 100 mg by mouth daily.   Yes Historical Provider, MD  meloxicam (MOBIC) 15 MG tablet Take 15 mg by mouth daily. 06/29/13  Yes Historical Provider, MD  metoprolol tartrate (LOPRESSOR) 25 MG tablet Take 12.5 mg by mouth 2 (two) times daily. Patient  takes 1/2 tablet twice a day   Yes Historical Provider, MD  potassium chloride SA (K-DUR,KLOR-CON) 20 MEQ tablet Take 20 mEq by mouth daily.   Yes Historical Provider, MD  simvastatin (ZOCOR) 20 MG tablet Take 20 mg by mouth daily. In the afternoon   Yes Historical Provider, MD  vitamin B-12 (CYANOCOBALAMIN) 1000 MCG tablet Take 1,000 mcg by mouth daily.   Yes Historical Provider, MD  zolpidem (AMBIEN) 10 MG tablet Take 10 mg by mouth at bedtime.   Yes Historical Provider, MD   Physical Exam: Filed Vitals:   07/19/13 1457  BP: 113/40  Pulse: 73  Temp: 98.6 F (37 C)  Resp: 20    BP 113/40  Pulse 73  Temp(Src) 98.6 F (37 C) (Oral)  Resp 20  SpO2 91%  General:  Appears calm and comfortable Eyes: PERRL, normal lids, irises & conjunctiva ENT: grossly normal hearing, lips & tongue Neck: no LAD, masses or thyromegaly Cardiovascular: RRR, no m/r/g. No LE edema. Telemetry: SR, no arrhythmias  Respiratory: CTA bilaterally, no w/r/r. Normal respiratory effort. Abdomen: soft, diffusely tender more in the epigastric and RUQ area, bs+ Skin: no rash or induration seen on limited exam Musculoskeletal: grossly normal tone BUE/BLE Psychiatric: grossly normal mood and affect, speech fluent and appropriate Neurologic: grossly non-focal.          Labs on Admission:  Basic Metabolic Panel:  Recent Labs Lab 07/17/13 1030 07/19/13 0910  NA 143 137  K 4.8 4.5  CL 102 98  CO2 28 27  GLUCOSE 94 112*  BUN 29* 22  CREATININE 1.03 0.81  CALCIUM 9.9 9.2   Liver Function Tests:  Recent Labs Lab 07/17/13 1030 07/19/13 0910  AST 22 23  ALT 21 20  ALKPHOS 110 94  BILITOT 0.4 0.6  PROT 7.5 7.3  ALBUMIN 3.9 3.6    Recent Labs Lab 07/19/13 0910  LIPASE 2885*   No results found for this basename: AMMONIA,  in the last 168 hours CBC:  Recent Labs Lab 07/17/13 1030 07/19/13 0910  WBC 5.8 10.8*  NEUTROABS 3.7 9.2*  HGB 13.9 12.9  HCT 41.2 38.3  MCV 93.0 92.7  PLT 240 216     Cardiac Enzymes: No results found for this basename: CKTOTAL, CKMB, CKMBINDEX, TROPONINI,  in the last 168 hours  BNP (last 3 results) No results found for this basename: PROBNP,  in the last 8760 hours CBG: No results found for this basename: GLUCAP,  in the last 168 hours  Radiological Exams on Admission: Dg Ercp With Sphincterotomy  07/18/2013   CLINICAL DATA:  ERCP, sphincterotomy, balloon dilatation and stent placement  EXAM: ERCP  TECHNIQUE: Multiple spot images obtained with the fluoroscopic device and submitted  for interpretation post-procedure.  COMPARISON:  None.  FINDINGS: A total of 6 intraoperative spot images demonstrate first cannulation of the pancreatic duct with pancreatic ductogram. No stenosis, stricture, focal dilatation or stone. Subsequently, the common bile duct is cannulated and a wire advanced the right intrahepatic biliary radicles. Sphincterotomy and balloon sweeping of the biliary tree was then performed. The final images demonstrate placement of a plastic biliary stent extending from the common bile duct into the duodenum.  IMPRESSION: ERCP, sphincterotomy, balloon sweeping and a plastic biliary stent placement as above.  These images were submitted for radiologic interpretation only. Please see the procedural report for the amount of contrast and the fluoroscopy time utilized.   Electronically Signed   By: Malachy Moan M.D.   On: 07/18/2013 09:27   Dg Abd Acute W/chest  07/19/2013   CLINICAL DATA:  Right upper quadrant abdominal pain  EXAM: ACUTE ABDOMEN SERIES (ABDOMEN 2 VIEW & CHEST 1 VIEW)  COMPARISON:  Recent prior ERCP 07/18/2013  FINDINGS: Mild dependent atelectasis in the lower lobes. Cardiac and mediastinal contours are within normal limits. Atherosclerotic calcification present in the transverse aorta. No focal consolidation, pleural effusion or pneumothorax. Incompletely imaged anterior cervical stabilization hardware.  No evidence of bowel obstruction.  A plastic biliary stent projects over the right upper quadrant in the expected location. There is associated pneumobilia. Surgical clips suggest prior cholecystectomy. No evidence of bowel obstruction, free air or ascites. No organomegaly or suspicious calcification. Multilevel degenerative spurring throughout the lumbar spine. No acute osseous abnormality.  IMPRESSION: 1. Nonobstructed bowel gas pattern. 2. No evidence for free air. 3. Plastic biliary stent with expected pneumobilia.   Electronically Signed   By: Malachy Moan M.D.   On: 07/19/2013 10:04    Assessment/Plan Active Problems:   Drug-induced pancreatitis DUE TO CONTRAST   Benign essential HTN   Urinary retention   Acute pancreatitis   Pancreatitis. Likely as a complication of ERCP. Appreciate GI assistance. She'll be to n.p.o. for bowel rest, received IV fluids and pain medications. We'll advance diet as tolerated. Continue antiemetics.  Urinary retention. Continue Foley catheter. Once the patient is ambulatory, we will discontinue Foley catheter for a voiding trial. If she fails voiding trial, she will likely need outpatient followup with urology.  Hypertension. Continue outpatient management.  Bipolar disorder. Continue psychotropics   Code Status: full code Family Communication: discussed with husband at the bedside Disposition Plan: discharge home once improved  Time spent:  [facility] Pager [phone]  **Disclaimer: This note may have been dictated with voice recognition software. Similar sounding words can inadvertently be transcribed and this note may contain transcription errors which may not have been corrected upon publication of note.**

## 2013-07-19 NOTE — ED Notes (Signed)
MD at bedside. 

## 2013-07-20 ENCOUNTER — Inpatient Hospital Stay (HOSPITAL_COMMUNITY): Payer: Medicare Other

## 2013-07-20 ENCOUNTER — Encounter (HOSPITAL_COMMUNITY): Payer: Self-pay | Admitting: *Deleted

## 2013-07-20 LAB — URINALYSIS, ROUTINE W REFLEX MICROSCOPIC
Glucose, UA: NEGATIVE mg/dL
Ketones, ur: 40 mg/dL — AB
Leukocytes, UA: NEGATIVE
Nitrite: NEGATIVE
Protein, ur: 30 mg/dL — AB
Specific Gravity, Urine: >1.03 — ABNORMAL HIGH (ref 1.005–1.030)
Urobilinogen, UA: 0.2 mg/dL (ref 0.0–1.0)
pH: 6 (ref 5.0–8.0)

## 2013-07-20 LAB — COMPREHENSIVE METABOLIC PANEL
ALBUMIN: 2.8 g/dL — AB (ref 3.5–5.2)
ALK PHOS: 90 U/L (ref 39–117)
ALT: 17 U/L (ref 0–35)
AST: 23 U/L (ref 0–37)
BUN: 19 mg/dL (ref 6–23)
CHLORIDE: 104 meq/L (ref 96–112)
CO2: 21 meq/L (ref 19–32)
CREATININE: 0.82 mg/dL (ref 0.50–1.10)
Calcium: 7.9 mg/dL — ABNORMAL LOW (ref 8.4–10.5)
GFR, EST AFRICAN AMERICAN: 81 mL/min — AB (ref >90)
GFR, EST NON AFRICAN AMERICAN: 70 mL/min — AB (ref >90)
GLUCOSE: 73 mg/dL (ref 70–99)
Potassium: 4 mEq/L (ref 3.7–5.3)
Sodium: 140 mEq/L (ref 137–147)
Total Bilirubin: 0.6 mg/dL (ref 0.3–1.2)
Total Protein: 6.2 g/dL (ref 6.0–8.3)

## 2013-07-20 LAB — URINE MICROSCOPIC-ADD ON

## 2013-07-20 LAB — LIPASE, BLOOD: LIPASE: 294 U/L — AB (ref 11–59)

## 2013-07-20 MED ORDER — ONDANSETRON HCL 4 MG PO TABS
4.0000 mg | ORAL_TABLET | Freq: Three times a day (TID) | ORAL | Status: DC
  Administered 2013-07-20 – 2013-07-21 (×6): 4 mg via ORAL
  Filled 2013-07-20 (×5): qty 1

## 2013-07-20 MED ORDER — SODIUM CHLORIDE 0.9 % IV SOLN
INTRAVENOUS | Status: DC
  Administered 2013-07-20 – 2013-07-22 (×6): via INTRAVENOUS
  Filled 2013-07-20 (×9): qty 1000

## 2013-07-20 MED ORDER — SODIUM CHLORIDE 0.9 % IV SOLN
INTRAVENOUS | Status: AC
  Filled 2013-07-20 (×2): qty 500

## 2013-07-20 MED ORDER — SODIUM CHLORIDE 0.9 % IV SOLN
500.0000 mg | Freq: Three times a day (TID) | INTRAVENOUS | Status: DC
  Administered 2013-07-20 – 2013-07-25 (×14): 500 mg via INTRAVENOUS
  Filled 2013-07-20 (×24): qty 500

## 2013-07-20 NOTE — Progress Notes (Signed)
ANTIBIOTIC CONSULT NOTE - INITIAL  Pharmacy Consult for Primaxin Indication: intra-abdominal infection  Allergies  Allergen Reactions  . Codeine Nausea And Vomiting    Patient Measurements: Height: 5\' 4"  (162.6 cm) Weight: 174 lb 14.4 oz (79.334 kg) IBW/kg (Calculated) : 54.7  Vital Signs: Temp: 101.6 F (38.7 C) (06/21 1719) Temp src: Oral (06/21 1719) BP: 126/40 mmHg (06/21 1402) Pulse Rate: 68 (06/21 1402) Intake/Output from previous day: 06/20 0701 - 06/21 0700 In: 0  Out: 300 [Urine:300] Intake/Output from this shift: Total I/O In: 527.5 [P.O.:220; I.V.:307.5] Out: 400 [Urine:400]  Labs:  Recent Labs  07/19/13 0910 07/20/13 0631  WBC 10.8*  --   HGB 12.9  --   PLT 216  --   CREATININE 0.81 0.82   Estimated Creatinine Clearance: 63.1 ml/min (by C-G formula based on Cr of 0.82). No results found for this basename: VANCOTROUGH, Leodis BinetVANCOPEAK, VANCORANDOM, GENTTROUGH, GENTPEAK, GENTRANDOM, TOBRATROUGH, TOBRAPEAK, TOBRARND, AMIKACINPEAK, AMIKACINTROU, AMIKACIN,  in the last 72 hours   Microbiology: Recent Results (from the past 720 hour(s))  CULTURE, BLOOD (ROUTINE X 2)     Status: None   Collection Time    07/20/13  6:01 PM      Result Value Ref Range Status   Specimen Description Blood BLOOD RIGHT HAND   Final   Special Requests BOTTLES DRAWN AEROBIC ONLY 6CC   Final   Culture PENDING   Incomplete   Report Status PENDING   Incomplete  CULTURE, BLOOD (ROUTINE X 2)     Status: None   Collection Time    07/20/13  6:01 PM      Result Value Ref Range Status   Specimen Description Blood BLOOD LEFT HAND   Final   Special Requests BOTTLES DRAWN AEROBIC ONLY 6CC   Final   Culture PENDING   Incomplete   Report Status PENDING   Incomplete    Medical History: Past Medical History  Diagnosis Date  . GERD (gastroesophageal reflux disease)   . Bipolar 2 disorder   . Major depressive disorder   . Chronic insomnia   . Non-toxic multinodular goiter   . Osteopenia    . Vitamin D deficiency   . Migraine   . Elevated LFTs   . Diverticulosis of colon   . H/O first degree atrioventricular block   . Fatty liver disease, nonalcoholic   . Hypertension   . Hypercholesteremia   . Sleep apnea     Stop Bang score of 4. Pt said she had sleep apnea at one time, but she has had another sleep study since then and they said she does not have it anymore.   Primaxin 6/21 >>  Assessment: 73yo female with good renal fxn.  Estimated Creatinine Clearance: 63.1 ml/min (by C-G formula based on Cr of 0.82).  Asked to initiate Primaxin IV for intra-abdominal infection.    Goal of Therapy:  Eradicate infection.  Plan:  Primaxin 500mg  IV q8hrs Monitor labs, progress and cultures  Valrie HartHall, Scott A 07/20/2013,6:59 PM

## 2013-07-20 NOTE — Progress Notes (Signed)
.    Assessment/Plan: ADMITTED WITH POST-ERCP PANCREATITIS. SLOWLY IMPROVING. PAIN BETTER. NAUSEA SAME. NO VOMITING. LIPASE IMPROVED.  PLAN: 1. ADVANCE DIET 2. CONTINUE ZOF/DIL ATC 3. DR. Karilyn CotaEHMAN TO RESUME CARE JUN 22   Subjective: Since I last evaluated the patient NAUSEA SAME. ABDOMINAL PAIN IMPROVED.   Objective: Vital signs in last 24 hours: Filed Vitals:   07/20/13 0502  BP: 139/54  Pulse: 85  Temp: 99 F (37.2 C)  Resp: 20     General appearance: alert, cooperative and no distress Cardio: regular rate and rhythm GI: soft, non-tender; bowel sounds normal; no masses,  no organomegaly  Lab Results: ALBUMIN 2.8 O/W NL HFP LIPASE 294 K 4.0 Cr 0.82    Studies/Results: NONE   Medications: I have reviewed the patient's current medications.   LOS: 5 days   Jonette EvaSandi Fields 07/10/2013, 2:23 PM

## 2013-07-20 NOTE — Progress Notes (Addendum)
[facility] PROGRESS NOTE  Romero LinerHelen Altice ZOX:[id number] DOB: Jun 29, 1940 DOA: 07/19/2013 PCP: [name],[name], MD  Assessment/Plan: 1. Post ERCP pancreatitis. Lipase is trending down. Clinically she appears to be improving. Started on clear liquids and appears to be tolerating this. We'll advance diet as tolerated. Continue supportive treatment. Appreciate GI assistance. 2. Urinary retention. Continue Foley catheter. May try voiding trial once patient is ambulatory. 3. Hypertension. Continue the outpatient regimen. 4. Bipolar disorder. Continue psychotropics  Code Status: full code Family Communication: discussed with patient, husband at the bedside Disposition Plan: discharge home once improved   Consultants:  Gastroenterology  Procedures:    Antibiotics:    HPI/Subjective: abd pain is better, no vomiting, nausea persists  Objective: Filed Vitals:   07/20/13 1402  BP: 126/40  Pulse: 68  Temp: 98.3 F (36.8 C)  Resp: 18    Intake/Output Summary (Last 24 hours) at 07/20/13 1458 Last data filed at 07/20/13 0500  Gross per 24 hour  Intake      0 ml  Output    300 ml  Net   -300 ml   Filed Weights   07/19/13 1457  Weight: 79.334 kg (174 lb 14.4 oz)    Exam:   General:  NAD  Cardiovascular: s1, s2, rrr  Respiratory: CTA B  Abdomen: soft, diffusely tender, bs+  Musculoskeletal: no edema b/l   Data Reviewed: Basic Metabolic Panel:  Recent Labs Lab 07/17/13 1030 07/19/13 0910 07/20/13 0631  NA 143 137 140  K 4.8 4.5 4.0  CL 102 98 104  CO2 28 27 21   GLUCOSE 94 112* 73  BUN 29* 22 19  CREATININE 1.03 0.81 0.82  CALCIUM 9.9 9.2 7.9*   Liver Function Tests:  Recent Labs Lab 07/17/13 1030 07/19/13 0910 07/20/13 0631  AST 22 23 23   ALT 21 20 17   ALKPHOS 110 94 90  BILITOT 0.4 0.6 0.6  PROT 7.5 7.3 6.2  ALBUMIN 3.9 3.6 2.8*    Recent Labs Lab 07/19/13 0910 07/20/13 0631  LIPASE 2885* 294*   No results found for this  basename: AMMONIA,  in the last 168 hours CBC:  Recent Labs Lab 07/17/13 1030 07/19/13 0910  WBC 5.8 10.8*  NEUTROABS 3.7 9.2*  HGB 13.9 12.9  HCT 41.2 38.3  MCV 93.0 92.7  PLT 240 216   Cardiac Enzymes: No results found for this basename: CKTOTAL, CKMB, CKMBINDEX, TROPONINI,  in the last 168 hours BNP (last 3 results) No results found for this basename: PROBNP,  in the last 8760 hours CBG: No results found for this basename: GLUCAP,  in the last 168 hours  No results found for this or any previous visit (from the past 240 hour(s)).   Studies: Dg Abd Acute W/chest  07/19/2013   CLINICAL DATA:  Right upper quadrant abdominal pain  EXAM: ACUTE ABDOMEN SERIES (ABDOMEN 2 VIEW & CHEST 1 VIEW)  COMPARISON:  Recent prior ERCP 07/18/2013  FINDINGS: Mild dependent atelectasis in the lower lobes. Cardiac and mediastinal contours are within normal limits. Atherosclerotic calcification present in the transverse aorta. No focal consolidation, pleural effusion or pneumothorax. Incompletely imaged anterior cervical stabilization hardware.  No evidence of bowel obstruction. A plastic biliary stent projects over the right upper quadrant in the expected location. There is associated pneumobilia. Surgical clips suggest prior cholecystectomy. No evidence of bowel obstruction, free air or ascites. No organomegaly or suspicious calcification. Multilevel degenerative spurring throughout the lumbar spine. No acute osseous abnormality.  IMPRESSION: 1. Nonobstructed bowel gas pattern. 2.  No evidence for free air. 3. Plastic biliary stent with expected pneumobilia.   Electronically Signed   By: Malachy MoanHeath  McCullough M.D.   On: 07/19/2013 10:04    Scheduled Meds: . amLODipine  5 mg Oral Daily  . ARIPiprazole  10 mg Oral QHS  . calcium carbonate  1 tablet Oral BID  .  HYDROmorphone (DILAUDID) injection  0.5 mg Intravenous QID  . lamoTRIgine  200 mg Oral Daily  . losartan  100 mg Oral Daily  . metoprolol tartrate   12.5 mg Oral BID  . ondansetron  4 mg Oral TID AC & HS  . pantoprazole  40 mg Oral QAC supper  . simvastatin  20 mg Oral Daily  . vitamin B-12  1,000 mcg Oral Daily  . zolpidem  5 mg Oral QHS   Continuous Infusions: . 0.9 % sodium chloride with kcl 75 mL/hr at 07/20/13 1154    Active Problems:   Drug-induced pancreatitis DUE TO CONTRAST   Benign essential HTN   Urinary retention   Acute pancreatitis    Time spent: 30mins    MEMON,JEHANZEB  [facility] Pager [phone]. If 7PM-7AM, please contact night-coverage at [email or web address], password [email or web address] 07/20/2013, 2:58 PM  LOS: 1 day   Addendum: 17:30  Patient had a fever of 101.6. This could be related to her pancreatitis, although this is clinically improving. We'll check urinalysis and chest x-ray. Start empirically on Primaxin for now to cover intra-abdominal pathology. If fevers persist, may need repeat imaging to rule out abscess.  MEMON,JEHANZEB

## 2013-07-21 ENCOUNTER — Telehealth (INDEPENDENT_AMBULATORY_CARE_PROVIDER_SITE_OTHER): Payer: Self-pay | Admitting: *Deleted

## 2013-07-21 ENCOUNTER — Inpatient Hospital Stay (HOSPITAL_COMMUNITY): Payer: Medicare Other

## 2013-07-21 ENCOUNTER — Encounter (HOSPITAL_COMMUNITY): Payer: Self-pay | Admitting: Radiology

## 2013-07-21 DIAGNOSIS — K859 Acute pancreatitis without necrosis or infection, unspecified: Secondary | ICD-10-CM

## 2013-07-21 DIAGNOSIS — T50904A Poisoning by unspecified drugs, medicaments and biological substances, undetermined, initial encounter: Secondary | ICD-10-CM

## 2013-07-21 LAB — COMPREHENSIVE METABOLIC PANEL
ALBUMIN: 2.7 g/dL — AB (ref 3.5–5.2)
ALT: 24 U/L (ref 0–35)
AST: 34 U/L (ref 0–37)
Alkaline Phosphatase: 152 U/L — ABNORMAL HIGH (ref 39–117)
BILIRUBIN TOTAL: 0.8 mg/dL (ref 0.3–1.2)
BUN: 13 mg/dL (ref 6–23)
CALCIUM: 8.5 mg/dL (ref 8.4–10.5)
CO2: 20 mEq/L (ref 19–32)
Chloride: 102 mEq/L (ref 96–112)
Creatinine, Ser: 0.75 mg/dL (ref 0.50–1.10)
GFR, EST NON AFRICAN AMERICAN: 83 mL/min — AB (ref >90)
Glucose, Bld: 101 mg/dL — ABNORMAL HIGH (ref 70–99)
Potassium: 4.5 mEq/L (ref 3.7–5.3)
Sodium: 136 mEq/L — ABNORMAL LOW (ref 137–147)
TOTAL PROTEIN: 6.8 g/dL (ref 6.0–8.3)

## 2013-07-21 LAB — LIPASE, BLOOD: LIPASE: 39 U/L (ref 11–59)

## 2013-07-21 LAB — AMYLASE: Amylase: 140 U/L — ABNORMAL HIGH (ref 0–105)

## 2013-07-21 MED ORDER — IOHEXOL 300 MG/ML  SOLN
50.0000 mL | Freq: Once | INTRAMUSCULAR | Status: AC | PRN
  Administered 2013-07-21: 50 mL via ORAL

## 2013-07-21 MED ORDER — IOHEXOL 300 MG/ML  SOLN
100.0000 mL | Freq: Once | INTRAMUSCULAR | Status: AC | PRN
  Administered 2013-07-21: 100 mL via INTRAVENOUS

## 2013-07-21 MED ORDER — ONDANSETRON HCL 4 MG/2ML IJ SOLN
4.0000 mg | INTRAMUSCULAR | Status: DC | PRN
  Administered 2013-07-21 – 2013-07-27 (×13): 4 mg via INTRAVENOUS
  Filled 2013-07-21 (×13): qty 2

## 2013-07-21 NOTE — Telephone Encounter (Signed)
Patients husband called in requesting ASAP medical help  Wife had ERCP Friday and was not doing good  [facility]--admitted Saturday

## 2013-07-21 NOTE — Telephone Encounter (Signed)
Patient admitted over the weekend with Post ERCP pancreatitis;

## 2013-07-21 NOTE — Progress Notes (Signed)
Dr. Darrick PennaFields notes appreciated. Patient had ERCP with sphincterotomy and stenting by me on 07/18/2013. She was admitted with post ERCP pancreatitis and also developed urine retention.   Subjective; Patient complains of nausea but denies vomiting. He says most of her pain is in back. Epigastric pain has decreased. She denies chest pain or shortness of breath.  Objective; BP 153/66  Pulse 83  Temp(Src) 98.1 F (36.7 C) (Oral)  Resp 20  Ht 5\' 4"  (1.626 m)  Wt 174 lb 14.4 oz (79.334 kg)  BMI 30.01 kg/m2  SpO2 94% Patient is alert and in no acute distress. Cardiac exam with regular rhythm normal S1 and S2. No murmur or gallop noted. Auscultation of lungs revealed vesicular breath sounds bilaterally but diminished sounds at both bases. Abdomen is full. Bowel sounds are hypoactive. Abdomen is soft with moderate midepigastric tenderness. No organomegaly or masses. No LE edema noted.  Lab data; Serum lipase 39; was 294 yesterday and 2885 on 07/19/2013. Serum sodium 136, potassium 4.5, chloride 102, CO2 20, BUN 13, creatinine 0.75 Glucose 101 Tropin 0.8, repeat 152, AST 34, ALT 24, total protein 6.8 and albumin 2.7 and calcium 8.5.  Assessment;  #1.Post ERCP pancreatitis. Pain control appears to be satisfactory she may have an ileus.  Patient had temps spiked to 101.6 last night when she and blood and urine cultures and begun on Primaxin. #2. Urinary retention most likely secondary to general anesthesia. Patient has 40s catheter in place.  Recommendations; Serum amylase on today's serum. N.p.o. except by mouth meds. Increase IV fluid rate to 125 mL per hour. Abdominal pelvic CT with contrast today. CBC and metabolic 7 in a.m.

## 2013-07-21 NOTE — Care Management Note (Signed)
    Page 1 of 2   07/31/2013     11:52:21 AM CARE MANAGEMENT NOTE 07/31/2013  Patient:  Lindsay Palmer,Lindsay Palmer   Account Number:  [id number]  Date Initiated:  07/21/2013  Documentation initiated by:  Rosemary HolmsOBSON,AMY  Subjective/Objective Assessment:   Pt admitted from home with shelives with spouse. Normally independent and [facility] not [facility]     Action/Plan:   Anticipated DC Date:  07/31/2013   Anticipated DC Plan:  HOME W HOME HEALTH SERVICES  In-house referral  Nutrition      DC Planning Services  CM consult      [facility]  [facility]   Choice offered to / List presented to:  C-1 Patient   DME arranged  IV PUMP/EQUIPMENT      DME agency  [facility]     HH arranged  HH-1 RN      [facility]   Status of service:  Completed, signed off Medicare Important Message given?  YES (If response is "NO", the following Medicare IM given date fields will be blank) Date Medicare IM given:  07/24/2013 Medicare IM given by:  Rosemary HolmsOBSON,AMY Date Additional Medicare IM given:  07/31/2013 Additional Medicare IM given by:  Anibal HendersonGENEVA Jenavie Stanczak  [facility]:  [facility]  Per UR Regulation:  Reviewed for med. necessity/level of care/duration of stay  If discussed at Long Length of Stay Meetings, dates discussed:   07/24/2013  07/29/2013    Comments:  07/31/13  1100 Anibal HendersonGeneva Able Malloy RN After multiple phone calls, [facility] and there is no difficulty [place], and [place], and [facility]. Pt and spouse informed and are very appreciative of CM services. Will inform both pharmacy and [facility] of time of D/C and they will meet pt at the home 07/29/13 1500 Anibal HendersonGeneva Liliani Bobo RN Pt to go home with TNA. HH and pharmacy being set up. [facility] says [facility]NA is not covered in [place] by Medicare at home. [facility] checking to see if they will be able to cover the pt with nursing  over the holiday weekend. [facility] checking to see if medicare will cover TNA in [place], and they will deliver to [place]. They will return the call in AM. 07/25/13 Rosemary HolmsAmy Robson RN BSN CM Per Dr. Patty Sermonsehman's request, Larita FifeLynn in Nutrition asked to assist with TPN  07/21/13 Amy Leanord Hawkingobson RN BSN CM

## 2013-07-21 NOTE — Progress Notes (Signed)
CT results reviewed with patient and her husband earlier today. Extensive peripancreatic and pericolonic edema but no evidence of fluid collection or necrotizing pancreatitis. There small bilateral pleural effusions. Biliary stent in place with pneumobilia.

## 2013-07-21 NOTE — Progress Notes (Signed)
[facility] PROGRESS NOTE  Lindsay LinerHelen Palmer ZOX:[id number] DOB: [birth year] DOA: 07/19/2013 PCP: [name],[name], MD  Assessment/Plan: 1. Post ERCP pancreatitis. Lipase is trending down. Discussed with Dr. Karilyn Cotaehman. CT scan of the abdomen and pelvis indicates extensive peripancreatic and pericolonic edema but no evidence of fluid collection or necrotizing pancreatitis. She did have fever last night. She was started empirically on Primaxin. He does not have any other source of infection at this time. We'll continue current treatments. She has been made n.p.o. and continued on IV fluids.. 2. Urinary retention. Foley catheter discontinued today. She appears to have passed a voiding trial. We'll continue to monitor for recurrence. 3. Hypertension. Continue the outpatient regimen. 4. Bipolar disorder. Continue psychotropics  Code Status: full code Family Communication: discussed with patient, husband at the bedside Disposition Plan: discharge home once improved   Consultants:  Gastroenterology  Procedures:    Antibiotics:  Primaxin 6/21  HPI/Subjective: Feels very weak. Abdominal pain is better. No vomiting. Has back pain  Objective: Filed Vitals:   07/21/13 1517  BP: 137/66  Pulse: 79  Temp: 99.1 F (37.3 C)  Resp: 20    Intake/Output Summary (Last 24 hours) at 07/21/13 1702 Last data filed at 07/21/13 1444  Gross per 24 hour  Intake      0 ml  Output   1350 ml  Net  -1350 ml   Filed Weights   07/19/13 1457  Weight: 79.334 kg (174 lb 14.4 oz)    Exam:   General:  NAD  Cardiovascular: s1, s2, rrr  Respiratory: Diminished breath sounds at bases  Abdomen: soft, full, diffusely tender, bs+  Musculoskeletal: no edema b/l   Data Reviewed: Basic Metabolic Panel:  Recent Labs Lab 07/17/13 1030 07/19/13 0910 07/20/13 0631 07/21/13 0616  NA 143 137 140 136*  K 4.8 4.5 4.0 4.5  CL 102 98 104 102  CO2 28 27 21 20   GLUCOSE 94 112* 73 101*  BUN 29* 22 19 13    CREATININE 1.03 0.81 0.82 0.75  CALCIUM 9.9 9.2 7.9* 8.5   Liver Function Tests:  Recent Labs Lab 07/17/13 1030 07/19/13 0910 07/20/13 0631 07/21/13 0616  AST 22 23 23  34  ALT 21 20 17 24   ALKPHOS 110 94 90 152*  BILITOT 0.4 0.6 0.6 0.8  PROT 7.5 7.3 6.2 6.8  ALBUMIN 3.9 3.6 2.8* 2.7*    Recent Labs Lab 07/19/13 0910 07/20/13 0631 07/21/13 0616 07/21/13 0930  LIPASE 2885* 294* 39  --   AMYLASE  --   --   --  140*   No results found for this basename: AMMONIA,  in the last 168 hours CBC:  Recent Labs Lab 07/17/13 1030 07/19/13 0910  WBC 5.8 10.8*  NEUTROABS 3.7 9.2*  HGB 13.9 12.9  HCT 41.2 38.3  MCV 93.0 92.7  PLT 240 216   Cardiac Enzymes: No results found for this basename: CKTOTAL, CKMB, CKMBINDEX, TROPONINI,  in the last 168 hours BNP (last 3 results) No results found for this basename: PROBNP,  in the last 8760 hours CBG: No results found for this basename: GLUCAP,  in the last 168 hours  Recent Results (from the past 240 hour(s))  CULTURE, BLOOD (ROUTINE X 2)     Status: None   Collection Time    07/20/13  6:01 PM      Result Value Ref Range Status   Specimen Description BLOOD RIGHT HAND   Final   Special Requests BOTTLES DRAWN AEROBIC ONLY 6CC   Final  Culture NO GROWTH 1 DAY   Final   Report Status PENDING   Incomplete  CULTURE, BLOOD (ROUTINE X 2)     Status: None   Collection Time    07/20/13  6:01 PM      Result Value Ref Range Status   Specimen Description BLOOD LEFT HAND   Final   Special Requests BOTTLES DRAWN AEROBIC ONLY 6CC   Final   Culture NO GROWTH 1 DAY   Final   Report Status PENDING   Incomplete     Studies: Ct Abdomen Pelvis W Contrast  07/21/2013   CLINICAL DATA:  Post ERCP pancreatitis.  EXAM: CT ABDOMEN AND PELVIS WITH CONTRAST  TECHNIQUE: Multidetector CT imaging of the abdomen and pelvis was performed using the standard protocol following bolus administration of intravenous contrast.  CONTRAST:  100mL OMNIPAQUE  IOHEXOL 300 MG/ML  SOLN  COMPARISON:  ERCP 07/18/2013  FINDINGS: The lung bases demonstrate bilateral pleural effusions and bibasilar atelectasis. The heart is normal in size. No pericardial effusion.  The liver demonstrates pneumobilia associated with recent ERCP, sphincterotomy and stent placement. No worrisome enhancement or abscess. The gallbladder surgically absent. The plastic common bile duct stent appears well positioned. No complicating features are demonstrated. There are changes of acute pancreatitis with peripancreatic inflammatory phlegmon and inflammation mainly in the head of the pancreas. Normal enhancement of the pancreas without evidence for pancreatic necrosis. No abscess. The spleen is normal. The adrenal glands and kidneys are unremarkable.  Delete stomach is unremarkable. The duodenum is normal. The small bowel is unremarkable. There is significant fluid and inflammatory phlegmon in the small bowel mesenteries and omentum which appears to be causing moderate inflammation of the transverse colon. The appendix is normal.  No mesenteric or retroperitoneal mass or adenopathy. The aorta demonstrates moderate atherosclerotic calcifications. No focal aneurysm or dissection. Dense calcifications also noted at the major branch vessel ostia. There is a moderate amount of free pelvic fluid. No pelvic mass or adenopathy. The bladder is decompressed with Foley catheter. The uterus and ovaries are unremarkable. No inguinal mass or adenopathy.  The bony structures are unremarkable.  IMPRESSION: CT findings consistent with acute uncomplicated pancreatitis.  Moderate inflammatory phlegmon around the pancreas, in the small bowel mesenteric a, omentum and pelvis. There is inflammation of the transverse colon.  The plastic biliary stent appears to be in good position. No intrahepatic biliary dilatation.   Electronically Signed   By: Loralie ChampagneMark  Gallerani M.D.   On: 07/21/2013 11:52   Dg Chest Port 1 View  07/20/2013    CLINICAL DATA:  Fever.  EXAM: PORTABLE CHEST - 1 VIEW  COMPARISON:  Chest x-ray 07/19/2013.  FINDINGS: Film is underpenetrated limiting the diagnostic sensitivity and specificity of this examination. Lung volumes are low. There are some bibasilar opacities favored to predominantly reflect subsegmental atelectasis, although underlying airspace consolidation from aspiration or developing infection is difficult to exclude, particularly at the left lung base. Trace left pleural effusion. Pulmonary venous congestion, accentuated by low lung volumes, without frank pulmonary edema. Heart size is normal. Upper mediastinal contours are within normal limits. Atherosclerosis in the thoracic aorta. Orthopedic fixation hardware in the lower cervical spine.  IMPRESSION: 1. Low lung volumes with probable bibasilar subsegmental atelectasis and small left pleural effusion.   Electronically Signed   By: Trudie Reedaniel  Entrikin M.D.   On: 07/20/2013 19:06    Scheduled Meds: . amLODipine  5 mg Oral Daily  . ARIPiprazole  10 mg Oral QHS  . calcium carbonate  1 tablet Oral BID  .  HYDROmorphone (DILAUDID) injection  0.5 mg Intravenous QID  . imipenem-cilastatin  500 mg Intravenous 3 times per day  . lamoTRIgine  200 mg Oral Daily  . losartan  100 mg Oral Daily  . metoprolol tartrate  12.5 mg Oral BID  . ondansetron  4 mg Oral TID AC & HS  . pantoprazole  40 mg Oral QAC supper  . simvastatin  20 mg Oral Daily  . vitamin B-12  1,000 mcg Oral Daily  . zolpidem  5 mg Oral QHS   Continuous Infusions: . 0.9 % sodium chloride with kcl 125 mL/hr at 07/21/13 1203    Active Problems:   Drug-induced pancreatitis DUE TO CONTRAST   Benign essential HTN   Urinary retention   Acute pancreatitis    Time spent:    MEMON,JEHANZEB  [facility] Pager [phone]. If 7PM-7AM, please contact night-coverage at [email or web address], password [facility] 07/21/2013, 5:02 PM  LOS: 2 days

## 2013-07-22 LAB — BASIC METABOLIC PANEL
BUN: 10 mg/dL (ref 6–23)
CO2: 21 meq/L (ref 19–32)
Calcium: 8.3 mg/dL — ABNORMAL LOW (ref 8.4–10.5)
Chloride: 104 mEq/L (ref 96–112)
Creatinine, Ser: 0.66 mg/dL (ref 0.50–1.10)
GFR calc Af Amer: >90 mL/min (ref >90)
GFR, EST NON AFRICAN AMERICAN: 86 mL/min — AB (ref >90)
Glucose, Bld: 88 mg/dL (ref 70–99)
POTASSIUM: 4.1 meq/L (ref 3.7–5.3)
SODIUM: 139 meq/L (ref 137–147)

## 2013-07-22 LAB — CBC
HEMATOCRIT: 30.7 % — AB (ref 36.0–46.0)
Hemoglobin: 9.7 g/dL — ABNORMAL LOW (ref 12.0–15.0)
MCH: 30.5 pg (ref 26.0–34.0)
MCHC: 31.6 g/dL (ref 30.0–36.0)
MCV: 96.5 fL (ref 78.0–100.0)
Platelets: 157 10*3/uL (ref 150–400)
RBC: 3.18 MIL/uL — ABNORMAL LOW (ref 3.87–5.11)
RDW: 13.4 % (ref 11.5–15.5)
WBC: 9.6 10*3/uL (ref 4.0–10.5)

## 2013-07-22 MED ORDER — ENOXAPARIN SODIUM 40 MG/0.4ML ~~LOC~~ SOLN
40.0000 mg | SUBCUTANEOUS | Status: DC
  Administered 2013-07-22 – 2013-07-31 (×10): 40 mg via SUBCUTANEOUS
  Filled 2013-07-22 (×10): qty 0.4

## 2013-07-22 NOTE — Progress Notes (Signed)
Pt's abdomen is taut this morning. Pt is bladder scanned  Without results. There is 200 ml urine in foley drainage bag. Pt states she feels her stomach is big but it has been that way for a few days and her stomach does not hurt. She complains of back pain rated 7 out of 10. Will give PRN drugs.

## 2013-07-22 NOTE — Progress Notes (Signed)
ANTIBIOTIC CONSULT NOTE - follow up  Pharmacy Consult for Primaxin Indication: intra-abdominal infection  Allergies  Allergen Reactions  . Codeine Nausea And Vomiting   Patient Measurements: Height: 5\' 4"  (162.6 cm) Weight: 174 lb 14.4 oz (79.334 kg) IBW/kg (Calculated) : 54.7  Vital Signs: Temp: 98.8 F (37.1 C) (06/23 0603) Temp src: Oral (06/23 0603) BP: 148/68 mmHg (06/23 0603) Pulse Rate: 77 (06/23 0603) Intake/Output from previous day: 06/22 0701 - 06/23 0700 In: 3997.5 [I.V.:3497.5; IV Piggyback:500] Out: 1350 [Urine:1350] Intake/Output from this shift:    Labs:  Recent Labs  07/20/13 0631 07/21/13 0616 07/22/13 0510  WBC  --   --  9.6  HGB  --   --  9.7*  PLT  --   --  157  CREATININE 0.82 0.75 0.66   Estimated Creatinine Clearance: 64.7 ml/min (by C-G formula based on Cr of 0.66). No results found for this basename: VANCOTROUGH, VANCOPEAK, VANCORANDOM, GENTTROUGH, GENTPEAK, GENTRANDOM, TOBRATROUGH, TOBRAPEAK, TOBRARND, AMIKACINPEAK, AMIKACINTROU, AMIKACIN,  in the last 72 hours   Microbiology: Recent Results (from the past 720 hour(s))  CULTURE, BLOOD (ROUTINE X 2)     Status: None   Collection Time    07/20/13  6:01 PM      Result Value Ref Range Status   Specimen Description BLOOD RIGHT HAND   Final   Special Requests BOTTLES DRAWN AEROBIC ONLY 6CC   Final   Culture NO GROWTH 1 DAY   Final   Report Status PENDING   Incomplete  CULTURE, BLOOD (ROUTINE X 2)     Status: None   Collection Time    07/20/13  6:01 PM      Result Value Ref Range Status   Specimen Description BLOOD LEFT HAND   Final   Special Requests BOTTLES DRAWN AEROBIC ONLY 6CC   Final   Culture NO GROWTH 1 DAY   Final   Report Status PENDING   Incomplete   Medical History: Past Medical History  Diagnosis Date  . GERD (gastroesophageal reflux disease)   . Bipolar 2 disorder   . Major depressive disorder   . Chronic insomnia   . Non-toxic multinodular goiter   . Osteopenia    . Vitamin D deficiency   . Migraine   . Elevated LFTs   . Diverticulosis of colon   . H/O first degree atrioventricular block   . Fatty liver disease, nonalcoholic   . Hypertension   . Hypercholesteremia   . Sleep apnea     Stop Bang score of 4. Pt said she had sleep apnea at one time, but she has had another sleep study since then and they said she does not have it anymore.   Primaxin 6/21 >>  Assessment: 73yo female with good renal fxn.  Estimated Creatinine Clearance: 64.7 ml/min (by C-G formula based on Cr of 0.66).  Asked to initiate Primaxin IV for intra-abdominal infection.   Pt is afebrile with normal WBC.  Goal of Therapy:  Eradicate infection.  Plan:   Continue Primaxin 500mg  IV q8hrs  Monitor labs, progress and cultures  Deescalate antibiotics when improved / appropriate  Margo AyeHall, Scott A 07/22/2013,10:42 AM

## 2013-07-22 NOTE — Progress Notes (Addendum)
[facility] PROGRESS NOTE  Lindsay LinerHelen Palmer NWG:[id number] DOB: [birth year] DOA: 07/19/2013 PCP: [name],[name], MD  Summary:  This is a 73 year old female who underwent ERCP prior to admission for elevated liver function tests. She had biliary stent placed in pancreatogram. Following the procedure, she developed abdominal pain, nausea and vomiting. She returned to the hospital was found to have post ERCP pancreatitis. She was admitted to the hospital and started on intravenous fluids, bowel rest and pain medications. Her hospital course, she did develop some fever and was started on Primaxin. CT scan of the abdomen was done and showed persistence pancreatitis without any evidence of necrosis or abscess formation. She did develop some urinary retention post ERCP and had a Foley catheter placed prior to admission. We will discontinue Foley catheter today and attempt voiding trial. She will be started on a clear liquid diet tomorrow and can be advanced as tolerated. Gastroenterology is following. Will likely be in the hospital another few days.  Assessment/Plan: 1. Post ERCP pancreatitis. Lipase is trending down. Discussed with Dr. Karilyn Cotaehman. CT scan of the abdomen and pelvis indicates extensive peripancreatic and pericolonic edema but no evidence of fluid collection or necrotizing pancreatitis. Patient did have a fever and was started empirically on Primaxin. She does not have any other source of infection at this time, making her pancreatitis the likely cause. We'll continue current treatments. She has been made n.p.o. and continued on IV fluids. Anticipate starting diet tomorrow. 2. Urinary retention. Foley catheter was discontinued yesterday and patient was able to pass urine with no significant postvoid residual. Overnight it was felt that her abdomen was distended and Foley catheter was reinserted with only 200 cc of urine output. Will try to discontinue Foley catheter again since patient was able to  effectively void her urine yesterday. Would not reinsert Foley catheter unless patient has significant discomfort or significant postvoid residual. 3. Hypertension. Continue the outpatient regimen. 4. Bipolar disorder. Continue psychotropics 5. Bilateral pleural effusions and generalized edema. Likely related to volume resuscitation for pancreatitis in the setting of hypoalbuminemia. This should hopefully improve as her clinical condition resolves.  Code Status: full code Family Communication: discussed with patient, husband at the bedside Disposition Plan: discharge home once improved   Consultants:  Gastroenterology  Procedures:    Antibiotics:  Primaxin 6/21  HPI/Subjective: Feeling better today. Abdominal pain is improving. No vomiting. Foley catheter was replaced last night, although patient did not have any reported difficulty voiding  Objective: Filed Vitals:   07/22/13 1517  BP: 159/52  Pulse: 91  Temp: 98.5 F (36.9 C)  Resp: 20    Intake/Output Summary (Last 24 hours) at 07/22/13 1708 Last data filed at 07/22/13 1526  Gross per 24 hour  Intake 3997.5 ml  Output   1050 ml  Net 2947.5 ml   Filed Weights   07/19/13 1457  Weight: 79.334 kg (174 lb 14.4 oz)    Exam:   General:  NAD  Cardiovascular: s1, s2, rrr  Respiratory: Diminished breath sounds at bases  Abdomen: soft, full, diffusely tender, bs+  Musculoskeletal: One plus edema  Data Reviewed: Basic Metabolic Panel:  Recent Labs Lab 07/17/13 1030 07/19/13 0910 07/20/13 0631 07/21/13 0616 07/22/13 0510  NA 143 137 140 136* 139  K 4.8 4.5 4.0 4.5 4.1  CL 102 98 104 102 104  CO2 28 27 21 20 21   GLUCOSE 94 112* 73 101* 88  BUN 29* 22 19 13 10   CREATININE 1.03 0.81 0.82 0.75 0.66  CALCIUM  9.9 9.2 7.9* 8.5 8.3*   Liver Function Tests:  Recent Labs Lab 07/17/13 1030 07/19/13 0910 07/20/13 0631 07/21/13 0616  AST 22 23 23  34  ALT 21 20 17 24   ALKPHOS 110 94 90 152*  BILITOT  0.4 0.6 0.6 0.8  PROT 7.5 7.3 6.2 6.8  ALBUMIN 3.9 3.6 2.8* 2.7*    Recent Labs Lab 07/19/13 0910 07/20/13 0631 07/21/13 0616 07/21/13 0930  LIPASE 2885* 294* 39  --   AMYLASE  --   --   --  140*   No results found for this basename: AMMONIA,  in the last 168 hours CBC:  Recent Labs Lab 07/17/13 1030 07/19/13 0910 07/22/13 0510  WBC 5.8 10.8* 9.6  NEUTROABS 3.7 9.2*  --   HGB 13.9 12.9 9.7*  HCT 41.2 38.3 30.7*  MCV 93.0 92.7 96.5  PLT 240 216 157   Cardiac Enzymes: No results found for this basename: CKTOTAL, CKMB, CKMBINDEX, TROPONINI,  in the last 168 hours BNP (last 3 results) No results found for this basename: PROBNP,  in the last 8760 hours CBG: No results found for this basename: GLUCAP,  in the last 168 hours  Recent Results (from the past 240 hour(s))  CULTURE, BLOOD (ROUTINE X 2)     Status: None   Collection Time    07/20/13  6:01 PM      Result Value Ref Range Status   Specimen Description BLOOD RIGHT HAND   Final   Special Requests BOTTLES DRAWN AEROBIC ONLY 6CC   Final   Culture NO GROWTH 2 DAYS   Final   Report Status PENDING   Incomplete  CULTURE, BLOOD (ROUTINE X 2)     Status: None   Collection Time    07/20/13  6:01 PM      Result Value Ref Range Status   Specimen Description BLOOD LEFT HAND   Final   Special Requests BOTTLES DRAWN AEROBIC ONLY 6CC   Final   Culture NO GROWTH 2 DAYS   Final   Report Status PENDING   Incomplete     Studies: Ct Abdomen Pelvis W Contrast  07/21/2013   CLINICAL DATA:  Post ERCP pancreatitis.  EXAM: CT ABDOMEN AND PELVIS WITH CONTRAST  TECHNIQUE: Multidetector CT imaging of the abdomen and pelvis was performed using the standard protocol following bolus administration of intravenous contrast.  CONTRAST:  100mL OMNIPAQUE IOHEXOL 300 MG/ML  SOLN  COMPARISON:  ERCP 07/18/2013  FINDINGS: The lung bases demonstrate bilateral pleural effusions and bibasilar atelectasis. The heart is normal in size. No pericardial  effusion.  The liver demonstrates pneumobilia associated with recent ERCP, sphincterotomy and stent placement. No worrisome enhancement or abscess. The gallbladder surgically absent. The plastic common bile duct stent appears well positioned. No complicating features are demonstrated. There are changes of acute pancreatitis with peripancreatic inflammatory phlegmon and inflammation mainly in the head of the pancreas. Normal enhancement of the pancreas without evidence for pancreatic necrosis. No abscess. The spleen is normal. The adrenal glands and kidneys are unremarkable.  Delete stomach is unremarkable. The duodenum is normal. The small bowel is unremarkable. There is significant fluid and inflammatory phlegmon in the small bowel mesenteries and omentum which appears to be causing moderate inflammation of the transverse colon. The appendix is normal.  No mesenteric or retroperitoneal mass or adenopathy. The aorta demonstrates moderate atherosclerotic calcifications. No focal aneurysm or dissection. Dense calcifications also noted at the major branch vessel ostia. There is a moderate amount of free  pelvic fluid. No pelvic mass or adenopathy. The bladder is decompressed with Foley catheter. The uterus and ovaries are unremarkable. No inguinal mass or adenopathy.  The bony structures are unremarkable.  IMPRESSION: CT findings consistent with acute uncomplicated pancreatitis.  Moderate inflammatory phlegmon around the pancreas, in the small bowel mesenteric a, omentum and pelvis. There is inflammation of the transverse colon.  The plastic biliary stent appears to be in good position. No intrahepatic biliary dilatation.   Electronically Signed   By: Loralie Champagne M.D.   On: 07/21/2013 11:52   Dg Chest Port 1 View  07/20/2013   CLINICAL DATA:  Fever.  EXAM: PORTABLE CHEST - 1 VIEW  COMPARISON:  Chest x-ray 07/19/2013.  FINDINGS: Film is underpenetrated limiting the diagnostic sensitivity and specificity of this  examination. Lung volumes are low. There are some bibasilar opacities favored to predominantly reflect subsegmental atelectasis, although underlying airspace consolidation from aspiration or developing infection is difficult to exclude, particularly at the left lung base. Trace left pleural effusion. Pulmonary venous congestion, accentuated by low lung volumes, without frank pulmonary edema. Heart size is normal. Upper mediastinal contours are within normal limits. Atherosclerosis in the thoracic aorta. Orthopedic fixation hardware in the lower cervical spine.  IMPRESSION: 1. Low lung volumes with probable bibasilar subsegmental atelectasis and small left pleural effusion.   Electronically Signed   By: Trudie Reed M.D.   On: 07/20/2013 19:06    Scheduled Meds: . amLODipine  5 mg Oral Daily  . ARIPiprazole  10 mg Oral QHS  . calcium carbonate  1 tablet Oral BID  . enoxaparin (LOVENOX) injection  40 mg Subcutaneous Q24H  .  HYDROmorphone (DILAUDID) injection  0.5 mg Intravenous QID  . imipenem-cilastatin  500 mg Intravenous 3 times per day  . lamoTRIgine  200 mg Oral Daily  . losartan  100 mg Oral Daily  . metoprolol tartrate  12.5 mg Oral BID  . pantoprazole  40 mg Oral QAC supper  . simvastatin  20 mg Oral Daily  . vitamin B-12  1,000 mcg Oral Daily  . zolpidem  5 mg Oral QHS   Continuous Infusions: . 0.9 % sodium chloride with kcl 100 mL/hr at 07/22/13 1022    Active Problems:   Drug-induced pancreatitis DUE TO CONTRAST   Benign essential HTN   Urinary retention   Acute pancreatitis    Time spent:    MEMON,JEHANZEB  [facility] Pager [phone]. If 7PM-7AM, please contact night-coverage at [email or web address], password [facility] 07/22/2013, 5:08 PM  LOS: 3 days

## 2013-07-22 NOTE — Progress Notes (Signed)
Subjective; Patient states she was able to rest well last night. She states 40 scattered reinserted this morning but she's not sure why. Bladder scan revealed full bladder. She complains of back pain. She also complains of soreness across her upper abdomen. She denies nausea or vomiting. She is passing flatus but has not had a bowel movement.   Objective; BP 148/68  Pulse 77  Temp(Src) 98.8 F (37.1 C) (Oral)  Resp 20  Ht 5\' 4"  (1.626 m)  Wt 174 lb 14.4 oz (79.334 kg)  BMI 30.01 kg/m2  SpO2 95% Patient is alert and in no acute distress. She is lying flat in bed. Cardiac exam with regular rhythm normal S1 and S2. No murmur or gallop noted. Auscultation of lungs revealed vesicular breath sounds bilaterally but diminished sounds at both bases. Abdomen appears more distended than yesterday. Bowel sounds are active. Abdomen is soft across lower half  but she has moderate tenderness Ross upper half. No LE edema noted.  Lab data; WBC 9.6, H&H 9.7 and 30.7 and platelet count 157K Serum sodium 139, potassium 4.1, chloride 104, CO2 21, BUN 10, creatinine 0.66 Glucose 88 and serum calcium 8.3. Blood cultures remain negative.   Urine output 1350 mL last 24 hours.     Assessment;  #1.Post ERCP pancreatitis. Patient appears to be doing better. All parameters are pointing towards improvement. She has normal bowel sounds today. Abdomen is more distended possibly secondary to abdominal fluid. Patient is now 4 days status post sphincterotomy; therefore can initiate pharmacologic therapy for DVT prophylaxis.   #2. Recurrent urinary retention possibly secondary to narcotics.  Recommendations; Decrease IV fluid rate to 100 mL per hour. CBC, metabolic 7 serum amylase and lipase in a.m. Okay to begin enoxaparin since patient is 4 days status post sphincterotomy. Dicyclomine discontinued(prn medication).

## 2013-07-22 NOTE — Progress Notes (Signed)
Addressed with pt the fact that she has not had a bowel movement. She states she really would like to just speak with the doctor about it in the morning.

## 2013-07-23 DIAGNOSIS — D649 Anemia, unspecified: Secondary | ICD-10-CM | POA: Diagnosis present

## 2013-07-23 LAB — CBC
HCT: 31.1 % — ABNORMAL LOW (ref 36.0–46.0)
Hemoglobin: 10.2 g/dL — ABNORMAL LOW (ref 12.0–15.0)
MCH: 30.6 pg (ref 26.0–34.0)
MCHC: 32.8 g/dL (ref 30.0–36.0)
MCV: 93.4 fL (ref 78.0–100.0)
Platelets: 209 10*3/uL (ref 150–400)
RBC: 3.33 MIL/uL — AB (ref 3.87–5.11)
RDW: 14 % (ref 11.5–15.5)
WBC: 9 10*3/uL (ref 4.0–10.5)

## 2013-07-23 LAB — LIPASE, BLOOD: LIPASE: 17 U/L (ref 11–59)

## 2013-07-23 LAB — BASIC METABOLIC PANEL
BUN: 11 mg/dL (ref 6–23)
CHLORIDE: 103 meq/L (ref 96–112)
CO2: 22 meq/L (ref 19–32)
Calcium: 8.8 mg/dL (ref 8.4–10.5)
Creatinine, Ser: 0.64 mg/dL (ref 0.50–1.10)
GFR calc Af Amer: >90 mL/min (ref >90)
GFR calc non Af Amer: 87 mL/min — ABNORMAL LOW (ref >90)
Glucose, Bld: 92 mg/dL (ref 70–99)
Potassium: 3.6 mEq/L — ABNORMAL LOW (ref 3.7–5.3)
SODIUM: 140 meq/L (ref 137–147)

## 2013-07-23 LAB — AMYLASE: AMYLASE: 44 U/L (ref 0–105)

## 2013-07-23 MED ORDER — SENNA 8.6 MG PO TABS
1.0000 | ORAL_TABLET | Freq: Every day | ORAL | Status: DC
  Administered 2013-07-23: 8.6 mg via ORAL
  Filled 2013-07-23: qty 1

## 2013-07-23 MED ORDER — POLYETHYLENE GLYCOL 3350 17 G PO PACK
17.0000 g | PACK | Freq: Two times a day (BID) | ORAL | Status: DC
  Administered 2013-07-23 – 2013-07-24 (×3): 17 g via ORAL
  Filled 2013-07-23 (×3): qty 1

## 2013-07-23 MED ORDER — POTASSIUM CHLORIDE CRYS ER 20 MEQ PO TBCR
40.0000 meq | EXTENDED_RELEASE_TABLET | Freq: Once | ORAL | Status: AC
  Administered 2013-07-23: 40 meq via ORAL
  Filled 2013-07-23: qty 2

## 2013-07-23 NOTE — Progress Notes (Signed)
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 She underwent ERCP 6/19 with postprocedural complications of urinary retention. 6/20 she presented with abdominal pain and was admitted for post-ERCP pancreatitis.  Assessment/Plan: 1. Acute pancreatitis, secondary to ERCP. Gradual improvement noted. Lipase has normalized. Developed fever 6/21, started on antibiotics. Blood cultures no growth today. CT of the abdomen and pelvis with extensive peripancreatic and pericolonic edema but no evidence of fluid collection or necrotizing pancreatitis. 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 Status post sphincterotomy 6/19. 3. Anemia. Stable. No evidence of bleeding. Likely secondary to acute illness. 4. Small volume hematochezia. Likely related to sphincterotomy. 5. Urinary retention. Status post insertion of Foley catheter 6/19 in the emergency department. Foley catheter removed 6/22 but a likely secondary to narcotics, postanesthesia. Resolved. 6. Bilateral pleural effusions and generalized edema. Likely related to volume resuscitation for pancreatitis in the setting of hypoalbuminemia.  7. GERD. 8. History of bipolar disorder, major depressive disorder, chronic insomnia 9. Constipation.   Diet per gastroenterology.   Discontinue antibiotics of blood cultures negative at 72 hours.  Bowel regimen. Stop scheduled IV Dilaudid.  Wean oxygen  Replace potassium.  Code Status: full code DVT prophylaxis: Lovenox Family Communication: none Disposition Plan: home when improved  Brendia Sacksaniel Goodrich, MD  [facility]  Pager [phone] If 7PM-7AM, please contact night-coverage at [email or web address], password [email or web address] 07/23/2013, 9:05 AM  LOS: 4 days    Consultants:  Gastroenterology  Procedures:  ERCP Impression:  Normal pancreatogram.  Dilated CBD and CHD with short benign-appearing distal stricture.  Stricture was brushed for cytology.  10 French 5 cm long plastic stent placed across the stricture.    Antibiotics:  Primaxin 6/21 >>   HPI/Subjective: Feeling better. Tolerating diet. Some nausea with movement. Some abdominal soreness but not particularly pain. No recent bowel movement.  Objective: Filed Vitals:   07/22/13 2111 07/23/13 0433 07/23/13 0542 07/23/13 0710  BP: 171/62 158/65 179/69   Pulse: 78 93 104   Temp: 98.1 F (36.7 C) 98 F (36.7 C) 97.9 F (36.6 C)   TempSrc: Oral Oral Oral   Resp: 20 20    Height:      Weight:      SpO2:  92% 93% 93%    Intake/Output Summary (Last 24 hours) at 07/23/13 0905 Last data filed at 07/23/13 0424  Gross per 24 hour  Intake 2499.17 ml  Output   1000 ml  Net 1499.17 ml     Filed Weights   07/19/13 1457  Weight: 79.334 kg (174 lb 14.4 oz)    Exam:   Afebrile, vital signs are stable. Gen. Appears calm, comfortable. Nontoxic. Psych. Alert. Speech fluent and clear. Cardiovascular. Regular rate and rhythm. No murmur, rub or gallop. No lower extremity edema. Respiratory. Clear to auscultation bilaterally. No wheezes, rales or rhonchi. Normal respiratory effort. Abdomen. Distended. Soft.  Data Reviewed: Blood cultures no growth to date. Potassium 3.6. Lipase normal. Basic metabolic panel otherwise unremarkable. Hemoglobin stable 10.2.  Scheduled Meds: . amLODipine  5 mg Oral Daily  . ARIPiprazole  10 mg Oral QHS  . calcium carbonate  1 tablet Oral BID  . enoxaparin (LOVENOX) injection  40 mg Subcutaneous Q24H  .  HYDROmorphone (DILAUDID) injection  0.5 mg Intravenous QID  . imipenem-cilastatin  500 mg Intravenous 3 times per day  .  lamoTRIgine  200 mg Oral Daily  . losartan  100 mg Oral Daily  . metoprolol tartrate  12.5 mg Oral BID  . pantoprazole   40 mg Oral QAC supper  . simvastatin  20 mg Oral Daily  . vitamin B-12  1,000 mcg Oral Daily  . zolpidem  5 mg Oral QHS   Continuous Infusions: . 0.9 % sodium chloride with kcl 75 mL/hr at 07/22/13 2109    Active Problems:   Drug-induced pancreatitis DUE TO CONTRAST   Benign essential HTN   Urinary retention   Acute pancreatitis   Time spent 20 minutes

## 2013-07-23 NOTE — Progress Notes (Addendum)
Subjective; Patient had no difficulty voiding after Foley's catheter was removed. She is passing flatness. She complains of mild pain or soreness in the epigastrium. Most of her pain in his upper back on the right. She is getting relief with pain medication. Last dose was just after 9 PM. She denies shortness of breath or chest pain. She is hungry. She denies nausea.  Objective; BP 179/69  Pulse 104  Temp(Src) 97.9 F (36.6 C) (Oral)  Resp 20  Ht 5\' 4"  (1.626 m)  Wt 174 lb 14.4 oz (79.334 kg)  BMI 30.01 kg/m2  SpO2 93% Patient is alert and appears to be comfortable. Cardiac exam with regular rhythm normal S1 and S2. No murmur or gallop noted. Auscultation of lungs revealed vesicular breath sounds bilaterally but diminished sounds at both bases. Abdomen appears less distended than yesterday. Bowel sounds are active. Mild to moderate tenderness noted in midepigastric region. No organomegaly or masses. No LE edema noted.  Lab data; WBC 9.0, H&H 10.2 and 31.1 and platelet count 209K Serum sodium 140, potassium 3.6, chloride 103, CO2 22, BUN 11, creatinine 0.64 Glucose 92 and serum calcium 8.8. Serum amylase  44. Serum lipase 17. Brush cytology from bile duct reveals benign reactive epithelium/reparative changes.  Blood cultures negative after 2 days.   Urine output  1000 mL last 24 hours.   Assessment;  #1.Post ERCP pancreatitis. Continued gradual improvement. She has good bowel sounds. Serum amylase and lipase are normal. Nausea has resolved and she is hungry. #2. Benign distal CBD stricture. Status post ERCP with sphincterotomy and stenting on 07/18/2013. #2. Anemia. No evidence of GI bleed. Anemia appears to be due to acute illness.    Recommendations; Begin clear liquids. CBC comprehensive chemistry panel and serum amylase in a.m. If blood cultures remain negative at 72 hours will discontinue IV antibiotic.

## 2013-07-24 DIAGNOSIS — D649 Anemia, unspecified: Secondary | ICD-10-CM

## 2013-07-24 LAB — CBC
HEMATOCRIT: 30.7 % — AB (ref 36.0–46.0)
HEMOGLOBIN: 10.3 g/dL — AB (ref 12.0–15.0)
MCH: 30.8 pg (ref 26.0–34.0)
MCHC: 33.6 g/dL (ref 30.0–36.0)
MCV: 91.9 fL (ref 78.0–100.0)
Platelets: 211 10*3/uL (ref 150–400)
RBC: 3.34 MIL/uL — AB (ref 3.87–5.11)
RDW: 13.7 % (ref 11.5–15.5)
WBC: 8.6 10*3/uL (ref 4.0–10.5)

## 2013-07-24 LAB — COMPREHENSIVE METABOLIC PANEL
ALBUMIN: 2.2 g/dL — AB (ref 3.5–5.2)
ALK PHOS: 357 U/L — AB (ref 39–117)
ALT: 49 U/L — AB (ref 0–35)
ALT: 40 U/L — ABNORMAL HIGH (ref 0–35)
AST: 101 U/L — AB (ref 0–37)
AST: 60 U/L — ABNORMAL HIGH (ref 0–37)
Albumin: 2.2 g/dL — ABNORMAL LOW (ref 3.5–5.2)
Alkaline Phosphatase: 335 U/L — ABNORMAL HIGH (ref 39–117)
BILIRUBIN TOTAL: 0.5 mg/dL (ref 0.3–1.2)
BUN: 7 mg/dL (ref 6–23)
BUN: 6 mg/dL (ref 6–23)
CALCIUM: 8.4 mg/dL (ref 8.4–10.5)
CHLORIDE: 99 meq/L (ref 96–112)
CHLORIDE: 95 meq/L — AB (ref 96–112)
CO2: 27 meq/L (ref 19–32)
CO2: 28 mEq/L (ref 19–32)
Calcium: 8.4 mg/dL (ref 8.4–10.5)
Creatinine, Ser: 0.55 mg/dL (ref 0.50–1.10)
Creatinine, Ser: 0.56 mg/dL (ref 0.50–1.10)
GFR calc Af Amer: >90 mL/min (ref >90)
GLUCOSE: 124 mg/dL — AB (ref 70–99)
Glucose, Bld: 110 mg/dL — ABNORMAL HIGH (ref 70–99)
POTASSIUM: 3.3 meq/L — AB (ref 3.7–5.3)
Potassium: 3.1 mEq/L — ABNORMAL LOW (ref 3.7–5.3)
SODIUM: 139 meq/L (ref 137–147)
Sodium: 137 mEq/L (ref 137–147)
Total Bilirubin: 0.5 mg/dL (ref 0.3–1.2)
Total Protein: 5.9 g/dL — ABNORMAL LOW (ref 6.0–8.3)
Total Protein: 6.1 g/dL (ref 6.0–8.3)

## 2013-07-24 LAB — AMYLASE: AMYLASE: 40 U/L (ref 0–105)

## 2013-07-24 MED ORDER — PANTOPRAZOLE SODIUM 40 MG IV SOLR
40.0000 mg | Freq: Two times a day (BID) | INTRAVENOUS | Status: DC
  Administered 2013-07-24 – 2013-07-28 (×10): 40 mg via INTRAVENOUS
  Filled 2013-07-24 (×11): qty 40

## 2013-07-24 MED ORDER — BISACODYL 10 MG RE SUPP
10.0000 mg | Freq: Once | RECTAL | Status: AC
  Administered 2013-07-24: 10 mg via RECTAL
  Filled 2013-07-24: qty 1

## 2013-07-24 MED ORDER — KCL IN DEXTROSE-NACL 20-5-0.45 MEQ/L-%-% IV SOLN
INTRAVENOUS | Status: DC
  Administered 2013-07-24 – 2013-07-25 (×3): via INTRAVENOUS

## 2013-07-24 NOTE — Progress Notes (Signed)
Subjective; Patient complains of nausea. She was unable to concoct hematocrits. She continues to complain of pain across her back. Pain epigastrium is about the same. She denies chest pain or shortness of breath. She is having difficulty voiding.   Objective; BP 155/46  Pulse 96  Temp(Src) 98.3 F (36.8 C) (Oral)  Resp 20  Ht 5\' 4"  (1.626 m)  Wt 174 lb 14.4 oz (79.334 kg)  BMI 30.01 kg/m2  SpO2 91%  Patient is alert and does not appear to be in distress. Cardiac exam with regular rhythm normal S1 and S2. No murmur or gallop noted. Auscultation of lungs revealed vesicular breath sounds bilaterally but diminished sounds at both bases. Abdomen is full. Bowel sounds are active. Abdomen is soft across lower half. She has moderate tenderness in epigastrium. No organomegaly or masses. No edema or clubbing noted.  Lab data; WBC 8.6, H&H 10.3 and 30.7 and platelet count 211K Serum sodium 139, potassium 3.3, chloride 99, CO2 27, BUN 7, creatinine 0.55 Glucose 124 and serum calcium 8.4. Serum amylase  40.  bilirubin 0.5, AP 357, AST 101, ALT 49 and albumin 2.2   Blood cultures negative after 3 days.   Urine output  1000 mL last 24 hours.   Assessment;  #1.Post ERCP pancreatitis. Patient unable to tolerate clear liquids. Lab studies do not suggest worsening pancreatitis but clinically she is not improving. I am concerned she may be forming pseudocyst even though amylase is normal #2. Benign distal CBD stricture. Status post ERCP with sphincterotomy and stenting on 07/18/2013. Transaminases and AP are not elevated. This abnormality is possibly due to pancreatitis impinging upon the bile duct. She has short 5 cm long stent. His transaminases are higher tomorrow would consider repeating abdominal pelvic CT. #2. Anemia. No evidence of GI bleed. Anemia appears to be due to acute illness.    Recommendations; Change pantoprazole 40 mg IV every 12 hours. Comprehensive chemistry panel in  a.m. Patient may need parenteral nutrition but will give another 24-48 hours.

## 2013-07-24 NOTE — Progress Notes (Signed)
Will hold simvastatin for now

## 2013-07-24 NOTE — Progress Notes (Signed)
Patient ID: [name], female   DOB: 1940-12-04, 73 y.o.   MRN: [id number] This am c/o mid back pain and epigastric pain. Says she does not feel like eating her diet. Feels nauseated. Says she slept good last night. Sitting up in chair. Appears somewhat uncomfortable. Filed Vitals:   07/23/13 1333 07/23/13 1344 07/23/13 2005 07/24/13 0437  BP:  143/71 172/65 155/46  Pulse:  91  96  Temp:  100.5 F (38.1 C) 98.4 F (36.9 C) 98.3 F (36.8 C)  TempSrc:  Oral Oral Oral  Resp:  20 20 20   Height:      Weight:      SpO2: 88% 96% 93% 91%   CMP     Component Value Date/Time   NA 139 07/24/2013 0515   K 3.3* 07/24/2013 0515   CL 99 07/24/2013 0515   CO2 27 07/24/2013 0515   GLUCOSE 124* 07/24/2013 0515   BUN 7 07/24/2013 0515   CREATININE 0.55 07/24/2013 0515   CALCIUM 8.4 07/24/2013 0515   PROT 5.9* 07/24/2013 0515   ALBUMIN 2.2* 07/24/2013 0515   AST 101* 07/24/2013 0515   ALT 49* 07/24/2013 0515   ALKPHOS 357* 07/24/2013 0515   BILITOT 0.5 07/24/2013 0515   GFRNONAA >90 07/24/2013 0515   GFRAA >90 07/24/2013 0515  Will continue to monitor.

## 2013-07-24 NOTE — Progress Notes (Signed)
Patient's condition reviewed with her husband who is at bedside.  Even though biochemical markers are unremarkable she is not showing any signs of improvement in the pancreatitis. She therefore will need to be n.p.o. Again. Dulcolax suppository ordered for this evening. Will DC by mouth laxatives and she is nauseated. Patient will be reassessed in a.m. and parenteral nutrition will be started tomorrow. Will plan to rescan her abdomen in the next 2-3 days.

## 2013-07-24 NOTE — Progress Notes (Signed)
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 She underwent ERCP 6/19 with postprocedural complications of urinary retention. 6/20 she presented with abdominal pain and was admitted for post-ERCP pancreatitis.  Assessment/Plan: 1. Acute pancreatitis, secondary to ERCP. hemodynamically stable, afebrile and blood cultures no growth to date, however clinically worse.  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 Status post sphincterotomy 6/19. 3. Anemia. Stable. No evidence of bleeding. Likely secondary to acute illness. 4. Small volume hematochezia. Likely related to sphincterotomy. 5. Urinary retention. Status post insertion of Foley catheter 6/19 in the emergency department. Foley catheter removed 6/22; likely secondary to narcotics, postanesthesia. Resolved. 6. Bilateral pleural effusions and generalized edema. Likely related to volume resuscitation for pancreatitis in the setting of hypoalbuminemia.  7. GERD. 8. History of bipolar disorder, major depressive disorder, chronic insomnia   Agree with n.p.o., further management per GI, consideration is being given to TNA and repeat abdominal image imaging.  Wean oxygen  Replace potassium.  Code Status: full code DVT prophylaxis: Lovenox Family Communication: none Disposition Plan: home when improved  Brendia Sacksaniel Goodrich, MD  [facility]  Pager [phone] If 7PM-7AM, please contact night-coverage at [email or web address], password [facility] 07/24/2013, 5:06 PM  LOS: 5 days   Consultants:  Gastroenterology  Procedures:  ERCP Impression:  Normal pancreatogram.  Dilated CBD and CHD with short benign-appearing distal stricture.  Stricture was brushed for cytology.  10 French 5 cm long plastic stent placed across the  stricture.    Antibiotics:  Primaxin 6/21 >>   HPI/Subjective: Feels worse today. Not tolerating liquids. Continues to have abdominal discomfort.  Objective: Filed Vitals:   07/23/13 1344 07/23/13 2005 07/24/13 0437 07/24/13 1448  BP: 143/71 172/65 155/46 169/69  Pulse: 91  96 93  Temp: 100.5 F (38.1 C) 98.4 F (36.9 C) 98.3 F (36.8 C) 98.1 F (36.7 C)  TempSrc: Oral Oral Oral Oral  Resp: 20 20 20 20   Height:      Weight:      SpO2: 96% 93% 91% 93%    Intake/Output Summary (Last 24 hours) at 07/24/13 1706 Last data filed at 07/24/13 1200  Gross per 24 hour  Intake    800 ml  Output   1600 ml  Net   -800 ml     Filed Weights   07/19/13 1457  Weight: 79.334 kg (174 lb 14.4 oz)    Exam:   Afebrile, vital signs are stable. Gen. Appears calm and mildly uncomfortable.Non-toxic. Psych. Alert, grossly normal mentation. Cardiovascular. Regular rate and rhythm. No murmur, rub or gallop. No lower extremity edema. Respiratory. Clear to auscultation bilaterally. No wheezes, rales or rhonchi. Normal respiratory effort. Abdomen. Soft, mild epigastric tenderness.  Data Reviewed: Blood cultures no growth to date. Potassium 3.3. Lipase normal. Basic metabolic panel otherwise unremarkable. AST, ALT, alkaline phosphatase now elevated. Total bilirubin normal. Hemoglobin stable 10.3.  Scheduled Meds: . amLODipine  5 mg Oral Daily  . ARIPiprazole  10 mg Oral QHS  . bisacodyl  10 mg Rectal Once  . calcium carbonate  1 tablet Oral BID  . enoxaparin (LOVENOX) injection  40 mg Subcutaneous Q24H  . imipenem-cilastatin  500 mg Intravenous 3 times per day  . lamoTRIgine  200 mg Oral Daily  . losartan  100 mg Oral Daily  . metoprolol tartrate  12.5 mg Oral BID  .  pantoprazole (PROTONIX) IV  40 mg Intravenous Q12H  . vitamin B-12  1,000 mcg Oral Daily  . zolpidem  5 mg Oral QHS   Continuous Infusions: . dextrose 5 % and 0.45 % NaCl with KCl 20 mEq/L      Principal  Problem:   Acute pancreatitis Active Problems:   Drug-induced pancreatitis DUE TO CONTRAST   Benign essential HTN   Urinary retention   Anemia   Time spent 20 minutes

## 2013-07-25 ENCOUNTER — Inpatient Hospital Stay (HOSPITAL_COMMUNITY): Payer: Medicare Other

## 2013-07-25 ENCOUNTER — Encounter (HOSPITAL_COMMUNITY): Payer: Medicare Other

## 2013-07-25 LAB — CULTURE, BLOOD (ROUTINE X 2)
Culture: NO GROWTH
Culture: NO GROWTH

## 2013-07-25 LAB — BASIC METABOLIC PANEL
BUN: 6 mg/dL (ref 6–23)
CO2: 29 mEq/L (ref 19–32)
Calcium: 8.3 mg/dL — ABNORMAL LOW (ref 8.4–10.5)
Chloride: 98 mEq/L (ref 96–112)
Creatinine, Ser: 0.62 mg/dL (ref 0.50–1.10)
GFR calc Af Amer: >90 mL/min (ref >90)
GFR calc non Af Amer: 88 mL/min — ABNORMAL LOW (ref >90)
Glucose, Bld: 146 mg/dL — ABNORMAL HIGH (ref 70–99)
POTASSIUM: 3.2 meq/L — AB (ref 3.7–5.3)
Sodium: 138 mEq/L (ref 137–147)

## 2013-07-25 LAB — CBC
HCT: 29.7 % — ABNORMAL LOW (ref 36.0–46.0)
HEMOGLOBIN: 9.9 g/dL — AB (ref 12.0–15.0)
MCH: 30.7 pg (ref 26.0–34.0)
MCHC: 33.3 g/dL (ref 30.0–36.0)
MCV: 92 fL (ref 78.0–100.0)
PLATELETS: 237 10*3/uL (ref 150–400)
RBC: 3.23 MIL/uL — AB (ref 3.87–5.11)
RDW: 13.9 % (ref 11.5–15.5)
WBC: 10 10*3/uL (ref 4.0–10.5)

## 2013-07-25 LAB — PHOSPHORUS: PHOSPHORUS: 1.8 mg/dL — AB (ref 2.3–4.6)

## 2013-07-25 LAB — TRIGLYCERIDES: Triglycerides: 95 mg/dL (ref <150)

## 2013-07-25 LAB — GLUCOSE, CAPILLARY: GLUCOSE-CAPILLARY: 119 mg/dL — AB (ref 70–99)

## 2013-07-25 LAB — PREALBUMIN: PREALBUMIN: 7.2 mg/dL — AB (ref 17.0–34.0)

## 2013-07-25 LAB — MAGNESIUM: MAGNESIUM: 1.7 mg/dL (ref 1.5–2.5)

## 2013-07-25 MED ORDER — SODIUM CHLORIDE 0.9 % IJ SOLN
10.0000 mL | Freq: Two times a day (BID) | INTRAMUSCULAR | Status: DC
  Administered 2013-07-25: 20 mL
  Administered 2013-07-28 – 2013-07-30 (×4): 10 mL

## 2013-07-25 MED ORDER — HYDROCORTISONE 1 % EX CREA: TOPICAL_CREAM | Freq: Two times a day (BID) | CUTANEOUS | Status: DC

## 2013-07-25 MED ORDER — FAT EMULSION 20 % IV EMUL
250.0000 mL | INTRAVENOUS | Status: DC
Start: 2013-07-25 — End: 2013-07-25
  Filled 2013-07-25: qty 250

## 2013-07-25 MED ORDER — POTASSIUM PHOSPHATES 15 MMOLE/5ML IV SOLN
15.0000 mmol | Freq: Once | INTRAVENOUS | Status: AC
  Administered 2013-07-25: 15 mmol via INTRAVENOUS
  Filled 2013-07-25: qty 5

## 2013-07-25 MED ORDER — SODIUM CHLORIDE 0.9 % IJ SOLN: 10.0000 mL | INTRAMUSCULAR | Status: DC | PRN

## 2013-07-25 MED ORDER — INSULIN ASPART 100 UNIT/ML ~~LOC~~ SOLN
0.0000 [IU] | SUBCUTANEOUS | Status: DC
  Administered 2013-07-26: 1 [IU] via SUBCUTANEOUS
  Administered 2013-07-27: 2 [IU] via SUBCUTANEOUS
  Administered 2013-07-27 – 2013-07-30 (×9): 1 [IU] via SUBCUTANEOUS
  Administered 2013-07-30 (×2): 2 [IU] via SUBCUTANEOUS
  Administered 2013-07-31 (×2): 1 [IU] via SUBCUTANEOUS

## 2013-07-25 MED ORDER — FAT EMULSION 20 % IV EMUL
100.0000 mL | Freq: Two times a day (BID) | INTRAVENOUS | Status: AC
  Administered 2013-07-25 – 2013-07-26 (×2): 100 mL via INTRAVENOUS
  Filled 2013-07-25 (×2): qty 100

## 2013-07-25 MED ORDER — SODIUM CHLORIDE 0.9 % IV SOLN
INTRAVENOUS | Status: DC
  Administered 2013-07-25 – 2013-07-26 (×2): via INTRAVENOUS

## 2013-07-25 MED ORDER — TRACE MINERALS CR-CU-F-FE-I-MN-MO-SE-ZN IV SOLN
INTRAVENOUS | Status: AC
  Administered 2013-07-25: 18:00:00 via INTRAVENOUS
  Filled 2013-07-25: qty 2000

## 2013-07-25 NOTE — Progress Notes (Signed)
Peripherally Inserted Central Catheter/Midline Placement  The IV Nurse has discussed with the patient and/or persons authorized to consent for the patient, the purpose of this procedure and the potential benefits and risks involved with this procedure.  The benefits include less needle sticks, lab draws from the catheter and patient may be discharged home with the catheter.  Risks include, but not limited to, infection, bleeding, blood clot (thrombus formation), and puncture of an artery; nerve damage and irregular heat beat.  Alternatives to this procedure were also discussed.  PICC/Midline Placement Documentation  PICC / Midline Double Lumen 07/25/13 PICC Right Basilic 42 cm 3 cm (Active)  Indication for Insertion or Continuance of Line Administration of hyperosmolar/irritating solutions (i.e. TPN, Vancomycin, etc.) 07/25/2013 10:15 AM  Exposed Catheter (cm) 3 cm 07/25/2013 10:15 AM  Site Assessment Clean;Dry;Intact 07/25/2013 10:15 AM  Lumen #1 Status Flushed;Saline locked;Blood return noted 07/25/2013 10:15 AM  Lumen #2 Status Flushed;Saline locked;Blood return noted 07/25/2013 10:15 AM  Dressing Type Transparent;Securing device 07/25/2013 10:15 AM  Dressing Status Clean;Dry;Intact 07/25/2013 10:15 AM  Line Care Connections checked and tightened 07/25/2013 10:15 AM       Daleen SquibbGibson, Karen Lynn 07/25/2013, 10:50 AM

## 2013-07-25 NOTE — Progress Notes (Addendum)
INITIAL NUTRITION ASSESSMENT  DOCUMENTATION CODES Per approved criteria  -Obesity Unspecified   INTERVENTION: TPN per pharmacy  NUTRITION DIAGNOSIS: Inadequate oral intake related to altered GI function as evidenced by NPO/clear liquids x 6 days, TPN initiation.   Goal: Pt will meet >75% of estimated nutritional needs  Monitor:  TPN advancement and tolerance, diet advancement, labs, weight changes, I/O's  Reason for Assessment: Consult, New TPN  73 y.o. female  Admitting Dx: Acute pancreatitis  ASSESSMENT: Pt s/p ERCP on 07/18/13 due to elevated LFT's, which revealed distal biliary stricture and sphincterotomy was completed (brushings pending). Pt developed urinary retention after procedure. She was admitted on 07/19/13 with acute biliary pancreatitis.  Per MD notes, markers are unremarkable, however, pt does not show signs of improvement. Abdomen remains distended; abdominal imaging may be repeated. She has been NPO/clear liquids x 6 days with poor tolerance (PO: 25-50%). PICC line has been placed today and TPN has started. Pt is currently receiving Clinimix 5/15 @ 30 ml/hr (which provides 511 kcals and 36 grams protein). Also receiving Intralipid 20% at 10 ml/hr (which provides 480 kcals). Regimen also includes daily MVI and minerals, as well as Kphos today for repletion and minimize risk for refeeding syndrome (07/24/13: Mg: 1.7, Phos: 1.8, K: 3.2). IVF's have already been decreased to 75 ml/hr. TPN at current rate will meet 56% of estimated kcal needs and 30% of estimated protein needs.   Per pharmacist, goal rate for TPN will be 75-80 ml/hr, which will meet 98-103% of estimated calorie needs and 76-81% of estimated protein needs, with the inclusion of daily lipids.   Height: Ht Readings from Last 1 Encounters:  07/19/13 5\' 4"  (1.626 m)    Weight: Wt Readings from Last 1 Encounters:  07/19/13 174 lb 14.4 oz (79.334 kg)    Ideal Body Weight: 120#  % Ideal Body Weight:  145%  Wt Readings from Last 10 Encounters:  07/19/13 174 lb 14.4 oz (79.334 kg)  07/18/13 171 lb (77.565 kg)  07/18/13 170 lb (77.111 kg)  07/17/13 170 lb (77.111 kg)  07/08/13 171 lb 14.4 oz (77.973 kg)    Usual Body Weight: 170#  % Usual Body Weight: 102%  BMI:  Body mass index is 30.01 kg/(m^2). Obesity, class I  Estimated Nutritional Needs: Kcal: [id number] Protein: 119-139 grams Fluid: 1.9-2.1 L  Skin: ecchymosis on rt arm and lt leg, +1 pitting edema on both legs  Diet Order: NPO  EDUCATION NEEDS: -Education not appropriate at this time   Intake/Output Summary (Last 24 hours) at 07/25/13 1452 Last data filed at 07/25/13 0622  Gross per 24 hour  Intake 1721.67 ml  Output   1551 ml  Net 170.67 ml    Last BM: 07/24/13  Labs:   Recent Labs Lab 07/24/13 0515 07/24/13 1830 07/25/13 0508  NA 139 137 138  K 3.3* 3.1* 3.2*  CL 99 95* 98  CO2 27 28 29   BUN 7 6 6   CREATININE 0.55 0.56 0.62  CALCIUM 8.4 8.4 8.3*  MG  --   --  1.7  PHOS  --   --  1.8*  GLUCOSE 124* 110* 146*    CBG (last 3)  No results found for this basename: GLUCAP,  in the last 72 hours  Scheduled Meds: . amLODipine  5 mg Oral Daily  . ARIPiprazole  10 mg Oral QHS  . calcium carbonate  1 tablet Oral BID  . enoxaparin (LOVENOX) injection  40 mg Subcutaneous Q24H  . fat emulsion  100 mL Intravenous Q12H  . insulin aspart  0-9 Units Subcutaneous 6 times per day  . lamoTRIgine  200 mg Oral Daily  . losartan  100 mg Oral Daily  . metoprolol tartrate  12.5 mg Oral BID  . pantoprazole (PROTONIX) IV  40 mg Intravenous Q12H  . potassium phosphate IVPB (mmol)  15 mmol Intravenous Once  . sodium chloride  10-40 mL Intracatheter Q12H  . vitamin B-12  1,000 mcg Oral Daily  . zolpidem  5 mg Oral QHS    Continuous Infusions: . Marland Kitchen.TPN (CLINIMIX-E) Adult    . dextrose 5 % and 0.45 % NaCl with KCl 20 mEq/L 75 mL/hr at 07/25/13 1136    Past Medical History  Diagnosis Date  . GERD  (gastroesophageal reflux disease)   . Bipolar 2 disorder   . Major depressive disorder   . Chronic insomnia   . Non-toxic multinodular goiter   . Osteopenia   . Vitamin D deficiency   . Migraine   . Elevated LFTs   . Diverticulosis of colon   . H/O first degree atrioventricular block   . Fatty liver disease, nonalcoholic   . Hypertension   . Hypercholesteremia   . Sleep apnea     Stop Bang score of 4. Pt said she had sleep apnea at one time, but she has had another sleep study since then and they said she does not have it anymore.    Past Surgical History  Procedure Laterality Date  . Neck surgery      Stenosis C6-C7  . Cholecystectomy    . Cataract surgery Bilateral 08-2012  . Colonoscopy  2003    Diverticulitis h/o polyps -due next 2015 -gastroscopy 2003  . Sigmoidoscopy  2007    no polyps  . Ercp    . Ercp N/A 07/18/2013    Procedure: ENDOSCOPIC RETROGRADE CHOLANGIOPANCREATOGRAPHY (ERCP) COMMON BILE DUCT BRUSHING;  Surgeon: Malissa HippoNajeeb U Rehman, MD;  Location: [facility];  Service: Endoscopy;  Laterality: N/A;  . Sphincterotomy N/A 07/18/2013    Procedure: SPHINCTEROTOMY;  Surgeon: Malissa HippoNajeeb U Rehman, MD;  Location: [facility];  Service: Endoscopy;  Laterality: N/A;  . Biliary stent placement N/A 07/18/2013    Procedure: BILIARY STENT PLACEMENT;  Surgeon: Malissa HippoNajeeb U Rehman, MD;  Location: [facility];  Service: Endoscopy;  Laterality: N/A;    Jenifer A. Mayford KnifeWilliams, RD, LDN Pager: [phone]

## 2013-07-25 NOTE — Progress Notes (Signed)
PROGRESS NOTE  Lindsay LinerHelen Palmer ZHY:[id number] DOB: 08/17/1940 DOA: 07/19/2013 PCP: Lindsay DeemJANNACH,[name], Palmer  Summary: 73 year old woman with history of right upper quadrant abdominal pain, mild ALT and alkaline phosphatase elevation and distal common bile duct stricture on MRCP. She underwent ERCP 6/19 with postprocedural complications of urinary retention. 6/20 she presented with abdominal pain and was admitted for post-ERCP pancreatitis.  Assessment/Plan: 1. Acute pancreatitis, secondary to ERCP. somewhat subjectively improved today. Excellent urine output. Remains afebrile, blood cultures no growth to date, normal CBC. Appears clinically improved today. 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 Status post sphincterotomy 6/19. 3. Anemia. Normocytic. Stable. Suspect anemia of acute illness. 4. Hypophosphatemia. Replete with TNA. 5. Small volume hematochezia. Resolved. Likely related to sphincterotomy. 6. Urinary retention. Resolved, likely secondary to narcotics, postanesthesia.  7. Bilateral pleural effusions and generalized edema. Likely related to volume resuscitation for pancreatitis in the setting of hypoalbuminemia.  8. GERD. 9. History of bipolar disorder, major depressive disorder, chronic insomnia   Overall somewhat improved today. Discussed with Lindsay Palmer, plan TNA.  Wean oxygen  Replace potassium and phosphorus with TNA. Monitor for refeeding syndrome.  Murmur likely benign. Consider outpatient evaluation if clinically indicated.  Code Status: full code DVT prophylaxis: Lovenox Family Communication: none Disposition Plan: home when improved  Lindsay Sacksaniel Goodrich, Palmer  [facility]  Pager [phone] If 7PM-7AM, please contact night-coverage at [email or web address], password [facility] 07/25/2013, 9:26 AM  LOS: 6 days   Consultants:  Gastroenterology  Procedures:  ERCP Impression:  Normal pancreatogram.  Dilated CBD and CHD with short benign-appearing distal stricture.    Stricture was brushed for cytology.  10 French 5 cm long plastic stent placed across the stricture.   Antibiotics:  Primaxin 6/21 >>   HPI/Subjective: Feels better today. No nausea or vomiting. Mild epigastric abdominal pain.  Objective: Filed Vitals:   07/24/13 0437 07/24/13 1448 07/24/13 2101 07/25/13 0532  BP: 155/46 169/69 149/48 113/46  Pulse: 96 93  93  Temp: 98.3 F (36.8 C) 98.1 F (36.7 C) 98.6 F (37 C) 98.4 F (36.9 C)  TempSrc: Oral Oral Oral Oral  Resp: 20 20 20 20   Height:      Weight:      SpO2: 91% 93% 95% 93%    Intake/Output Summary (Last 24 hours) at 07/25/13 0926 Last data filed at 07/25/13 0622  Gross per 24 hour  Intake 1841.67 ml  Output   1551 ml  Net 290.67 ml     Filed Weights   07/19/13 1457  Weight: 79.334 kg (174 lb 14.4 oz)    Exam:   Remains afebrile. Vital signs stable. Gen. Appears calm and comfortable. Appears better today. Psych. Alert. Speech fluent and clear. Grossly normal mentation. Cardiovascular. Regular rate and rhythm. 2/6 systolic murmur, no rub or gallop. No lower extremity edema. Respiratory. Clear to auscultation bilaterally. No wheezes, rales or rhonchi. Normal respiratory effort. Abdomen. Soft. Mild epigastric tenderness.  Data Reviewed: I/O: Excellent urine output, one bowel movement. Chemistry: Potassium 3.2. Phosphorus 1.8. Magnesium normal. Remainder of basic metabolic panel unremarkable. Heme: Hemoglobin stable 9.9. ID: Blood cultures remain no growth, final. Other: EKG normal sinus rhythm. No acute changes.  Scheduled Meds: . amLODipine  5 mg Oral Daily  . ARIPiprazole  10 mg Oral QHS  . calcium carbonate  1 tablet Oral BID  . enoxaparin (LOVENOX) injection  40 mg Subcutaneous Q24H  . imipenem-cilastatin  500 mg Intravenous 3 times per day  . lamoTRIgine  200 mg Oral Daily  . losartan  100 mg Oral Daily  . metoprolol tartrate  12.5 mg Oral BID  . pantoprazole (PROTONIX) IV  40 mg Intravenous  Q12H  . vitamin B-12  1,000 mcg Oral Daily  . zolpidem  5 mg Oral QHS   Continuous Infusions: . dextrose 5 % and 0.45 % NaCl with KCl 20 mEq/L 100 mL/hr at 07/25/13 [id number]    Principal Problem:   Acute pancreatitis Active Problems:   Drug-induced pancreatitis DUE TO CONTRAST   Benign essential HTN   Urinary retention   Anemia   Time spent 15 minutes

## 2013-07-25 NOTE — Progress Notes (Signed)
Subjective; Patient states she feels better than she did yesterday. She had a bowel movement and passed flatus after she was given Dulcolax suppository. She complains of soreness across upper abdomen. She states most of her pain is across her upper back. She says she is getting relief with pain medication. This morning she is not feeling nauseated. She denies chest pain or shortness.   Objective; BP 113/46  Pulse 93  Temp(Src) 98.4 F (36.9 C) (Oral)  Resp 20  Ht 5\' 4"  (1.626 m)  Wt 174 lb 14.4 oz (79.334 kg)  BMI 30.01 kg/m2  SpO2 93% Patient is alert and does not appear to be in distress. Cardiac exam regular rhythm normal S1 and split S2. Auscultation of the lungs reveals vesicular breath sounds bilaterally. Breath sounds are diminished at both bases. Abdomen is full. Bowel sounds are normal. Moderate tenderness noted some guarding in epigastric and right upper quadrant. No organomegaly or masses. She has 1+ pitting edema involving both legs.  Urine output  1000 mL last 24 hours.  Lab data; WBC 10.0, H&H 9.9 and 29.7 and platelet count 237K Metabolic 7 and pre-albumin pending. Serum magnesium 1.7    Assessment;  #1.Post ERCP pancreatitis. She does not appear to be worse than yesterday. Excellent urine output which is reassuring. #2. Benign distal CBD stricture. Status post ERCP with sphincterotomy and stenting on 07/18/2013. Repeat LFTs were done yesterday afternoon and today. Transaminase is an APR down. #3. nemia. H&H is stable. No evidence of active bleeding. #4. Abnormal heart sounds. #5. Malnutrition. Patient has been n.p.o. for one week.  Recommendations; EKG. Consult for TPN for which she will need PICC line. Decrease IV fluid rate to 75 mL per hour

## 2013-07-25 NOTE — Progress Notes (Signed)
PARENTERAL NUTRITION CONSULT NOTE - INITIAL  Pharmacy Consult for TPN Indication: Malnutrition  Allergies  Allergen Reactions  . Codeine Nausea And Vomiting    Patient Measurements: Height: 5\' 4"  (162.6 cm) Weight: 174 lb 14.4 oz (79.334 kg) IBW/kg (Calculated) : 54.7 Adjusted Body Weight: 62 kg  Vital Signs: Temp: 98.4 F (36.9 C) (06/26 0532) Temp src: Oral (06/26 0532) BP: 113/46 mmHg (06/26 0532) Pulse Rate: 93 (06/26 0532) Intake/Output from previous day: 06/25 0701 - 06/26 0700 In: 2081.7 [P.O.:480; I.V.:1301.7; IV Piggyback:300] Out: 2451 [Urine:2450; Stool:1] Intake/Output from this shift:    Labs:  Recent Labs  07/23/13 0453 07/24/13 0515 07/25/13 0508  WBC 9.0 8.6 10.0  HGB 10.2* 10.3* 9.9*  HCT 31.1* 30.7* 29.7*  PLT 209 211 237     Recent Labs  07/24/13 0515 07/24/13 1830 07/25/13 0508  NA 139 137 138  K 3.3* 3.1* 3.2*  CL 99 95* 98  CO2 27 28 29   GLUCOSE 124* 110* 146*  BUN 7 6 6   CREATININE 0.55 0.56 0.62  CALCIUM 8.4 8.4 8.3*  MG  --   --  1.7  PHOS  --   --  1.8*  PROT 5.9* 6.1  --   ALBUMIN 2.2* 2.2*  --   AST 101* 60*  --   ALT 49* 40*  --   ALKPHOS 357* 335*  --   BILITOT 0.5 0.5  --    Estimated Creatinine Clearance: 64.7 ml/min (by C-G formula based on Cr of 0.62).   No results found for this basename: GLUCAP,  in the last 72 hours  Medical History: Past Medical History  Diagnosis Date  . GERD (gastroesophageal reflux disease)   . Bipolar 2 disorder   . Major depressive disorder   . Chronic insomnia   . Non-toxic multinodular goiter   . Osteopenia   . Vitamin D deficiency   . Migraine   . Elevated LFTs   . Diverticulosis of colon   . H/O first degree atrioventricular block   . Fatty liver disease, nonalcoholic   . Hypertension   . Hypercholesteremia   . Sleep apnea     Stop Bang score of 4. Pt said she had sleep apnea at one time, but she has had another sleep study since then and they said she does not have  it anymore.    Medications:  Scheduled:  . amLODipine  5 mg Oral Daily  . ARIPiprazole  10 mg Oral QHS  . calcium carbonate  1 tablet Oral BID  . enoxaparin (LOVENOX) injection  40 mg Subcutaneous Q24H  . imipenem-cilastatin  500 mg Intravenous 3 times per day  . lamoTRIgine  200 mg Oral Daily  . losartan  100 mg Oral Daily  . metoprolol tartrate  12.5 mg Oral BID  . pantoprazole (PROTONIX) IV  40 mg Intravenous Q12H  . potassium phosphate IVPB (mmol)  15 mmol Intravenous Once  . sodium chloride  10-40 mL Intracatheter Q12H  . vitamin B-12  1,000 mcg Oral Daily  . zolpidem  5 mg Oral QHS    Insulin Requirements in the past 24 hours:  none  Current Nutrition:  NPO  Assessment: 73 yo F who has contrast-induced pancreatitis s/p ERCP.  She has been NPO since procedure on 6/19.  LFTs elevated, but trending down. TGs <100.  Prealbumin pending. She has no hx of DM and glucose has been <150.   Potassium and Phosphorus are low.  Magnesium in goal range.  Renal  function is at patient's baseline.   Nutritional Goals:  1860-2170 kCal/day, 55-100 grams of protein per day  Plan:   Initiate Clinimix E 5/15 at 3730ml/hr.  Titrate to goal rate (75-6580ml/hr) as tolerated.   Add MVI and minerals to TPN daily  Intralipid 20% at 5710ml/hr  MD already decreased IVF rate to 275ml/hr today.    Start CBGs q6h with sensitive sliding scale if needed  Kphos 15mMol IV x1 today to replete potassium & phosphorus & minimize risk of refeeding with initiation of TPN  F/U CBGs, electrolytes, LFTs, TGs, & prealbumin  [place], Mercy RidingAndrea Michelle 07/25/2013,11:38 AM

## 2013-07-25 NOTE — Progress Notes (Addendum)
Patient's condition discussed with Dr. Irene LimboGoodrich. She has developed a rash over her upper back without pruritus.? Antibiotic. Blood cultures have remained negative. She is febrile and does not have leukocytosis. Therefore will stop antibiotic. Dietary consultation.

## 2013-07-26 LAB — DIFFERENTIAL
Basophils Absolute: 0 10*3/uL (ref 0.0–0.1)
Basophils Relative: 0 % (ref 0–1)
Eosinophils Absolute: 0.3 10*3/uL (ref 0.0–0.7)
Eosinophils Relative: 3 % (ref 0–5)
LYMPHS ABS: 1.3 10*3/uL (ref 0.7–4.0)
LYMPHS PCT: 12 % (ref 12–46)
Monocytes Absolute: 1.1 10*3/uL — ABNORMAL HIGH (ref 0.1–1.0)
Monocytes Relative: 10 % (ref 3–12)
Neutro Abs: 7.9 10*3/uL — ABNORMAL HIGH (ref 1.7–7.7)
Neutrophils Relative %: 75 % (ref 43–77)

## 2013-07-26 LAB — COMPREHENSIVE METABOLIC PANEL
ALBUMIN: 2.1 g/dL — AB (ref 3.5–5.2)
ALK PHOS: 292 U/L — AB (ref 39–117)
ALT: 25 U/L (ref 0–35)
AST: 27 U/L (ref 0–37)
BUN: 5 mg/dL — ABNORMAL LOW (ref 6–23)
CHLORIDE: 95 meq/L — AB (ref 96–112)
CO2: 31 mEq/L (ref 19–32)
Calcium: 8.3 mg/dL — ABNORMAL LOW (ref 8.4–10.5)
Creatinine, Ser: 0.65 mg/dL (ref 0.50–1.10)
GFR calc Af Amer: >90 mL/min (ref >90)
GFR calc non Af Amer: 87 mL/min — ABNORMAL LOW (ref >90)
Glucose, Bld: 129 mg/dL — ABNORMAL HIGH (ref 70–99)
POTASSIUM: 3 meq/L — AB (ref 3.7–5.3)
Sodium: 137 mEq/L (ref 137–147)
Total Bilirubin: 0.3 mg/dL (ref 0.3–1.2)
Total Protein: 5.9 g/dL — ABNORMAL LOW (ref 6.0–8.3)

## 2013-07-26 LAB — GLUCOSE, CAPILLARY
GLUCOSE-CAPILLARY: 102 mg/dL — AB (ref 70–99)
GLUCOSE-CAPILLARY: 133 mg/dL — AB (ref 70–99)
Glucose-Capillary: 109 mg/dL — ABNORMAL HIGH (ref 70–99)
Glucose-Capillary: 121 mg/dL — ABNORMAL HIGH (ref 70–99)
Glucose-Capillary: 129 mg/dL — ABNORMAL HIGH (ref 70–99)
Glucose-Capillary: 153 mg/dL — ABNORMAL HIGH (ref 70–99)
Glucose-Capillary: 159 mg/dL — ABNORMAL HIGH (ref 70–99)

## 2013-07-26 LAB — CBC
HEMATOCRIT: 31.3 % — AB (ref 36.0–46.0)
Hemoglobin: 10.4 g/dL — ABNORMAL LOW (ref 12.0–15.0)
MCH: 30.6 pg (ref 26.0–34.0)
MCHC: 33.2 g/dL (ref 30.0–36.0)
MCV: 92.1 fL (ref 78.0–100.0)
PLATELETS: 247 10*3/uL (ref 150–400)
RBC: 3.4 MIL/uL — AB (ref 3.87–5.11)
RDW: 14.2 % (ref 11.5–15.5)
WBC: 10.6 10*3/uL — AB (ref 4.0–10.5)

## 2013-07-26 LAB — PHOSPHORUS: Phosphorus: 3.5 mg/dL (ref 2.3–4.6)

## 2013-07-26 LAB — TRIGLYCERIDES: Triglycerides: 109 mg/dL (ref <150)

## 2013-07-26 LAB — MAGNESIUM: MAGNESIUM: 1.8 mg/dL (ref 1.5–2.5)

## 2013-07-26 LAB — PREALBUMIN: Prealbumin: 7.1 mg/dL — ABNORMAL LOW (ref 17.0–34.0)

## 2013-07-26 MED ORDER — LIDOCAINE 5 % EX PTCH
1.0000 | MEDICATED_PATCH | CUTANEOUS | Status: DC
  Administered 2013-07-26 – 2013-07-31 (×6): 1 via TRANSDERMAL
  Filled 2013-07-26 (×7): qty 1

## 2013-07-26 MED ORDER — TRACE MINERALS CR-CU-F-FE-I-MN-MO-SE-ZN IV SOLN
INTRAVENOUS | Status: AC
  Administered 2013-07-26: 18:00:00 via INTRAVENOUS
  Filled 2013-07-26: qty 2000

## 2013-07-26 MED ORDER — HYDROCORTISONE 1 % EX CREA
TOPICAL_CREAM | Freq: Two times a day (BID) | CUTANEOUS | Status: DC
  Administered 2013-07-26 – 2013-07-29 (×7): via TOPICAL
  Administered 2013-07-29: 1 via TOPICAL
  Administered 2013-07-30 – 2013-07-31 (×3): via TOPICAL
  Filled 2013-07-26: qty 28

## 2013-07-26 MED ORDER — POTASSIUM CHLORIDE 10 MEQ/100ML IV SOLN
10.0000 meq | INTRAVENOUS | Status: AC
  Administered 2013-07-26 (×4): 10 meq via INTRAVENOUS
  Filled 2013-07-26 (×4): qty 100

## 2013-07-26 MED ORDER — FUROSEMIDE 20 MG PO TABS
20.0000 mg | ORAL_TABLET | Freq: Every day | ORAL | Status: DC
  Administered 2013-07-26 – 2013-07-27 (×2): 20 mg via ORAL
  Filled 2013-07-26 (×2): qty 1

## 2013-07-26 MED ORDER — FAT EMULSION 20 % IV EMUL
250.0000 mL | INTRAVENOUS | Status: AC
  Administered 2013-07-26: 250 mL via INTRAVENOUS
  Filled 2013-07-26: qty 250

## 2013-07-26 NOTE — Progress Notes (Signed)
Subjective; Patient states she is feeling better. She is having less nausea although she is nauseated right now. She denies chest pain or shortness of breath. She complains of soreness across her upper abdomen but continues to complain of back pain which is in lower lumbar region. She is not having any pain and upper back lower thoracic region. She has no difficulty voiding. She denies pruritus.   Objective; BP 114/47  Pulse 85  Temp(Src) 98.6 F (37 C) (Oral)  Resp 20  Ht 5\' 4"  (1.626 m)  Wt 174 lb 14.4 oz (79.334 kg)  BMI 30.01 kg/m2  SpO2 92% Patient is alert and does not appear to be in distress. Rash over upper back appears to be less intense and fading away. Cardiac exam with occasional irregularity; normal S1 and  S2. Auscultation of the lungs reveals vesicular breath sounds bilaterally. She has few fine rales at right base. Abdomen is full. Bowel sounds are normal. Abdomen is soft across lower half. She remains with moderate tenderness across upper abdomen. No organomegaly or masses. She has 1+ pitting edema involving both lower extremities.  Urine output 650 mL last 24 hours(not all urine measured)  Lab data; WBC 10.6, H&H 10.4 and 31.3 and platelet count 247K Serum sodium 137, potassium 3.0, chloride 95, CO2 31, BUN 5, creatinine 0.65. Glucose 129. Bilirubin 0.3, BP to 92, AST 27, ALT 25, total protein 5.9, serum albumin 2.1 and calcium 8.3. Serum phosphate 3.5 Serum magnesium 1.8  Pre-albumin 7.2 on 07/25/2013   Assessment;  #1.Post ERCP pancreatitis. Clinically she appears to be doing better. She is having back pain but that appears to be quite low and may be related to pancreatitis.  #2. Benign distal CBD stricture. Status post ERCP with sphincterotomy and stenting on 07/18/2013. Bilirubin and transaminases are normal. AP is mildly elevated but trending down. #3. Anemia. H&H remained stable. #4. Malnutrition. Patient has been essentially n.p.o. for 9 days. She was  begun on parenteral nutrition last evening. Rate to be escalated today. #5. Hypokalemia. Pharmacy to adjust potassium dose in TPN.  Recommendations; Encourage physical activity. PT consult on 07/28/2013. Potassium dose in TPN fluid to be increased in rate to be increased later today. Repeat labs in a.m.

## 2013-07-26 NOTE — Progress Notes (Signed)
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 She underwent ERCP 6/19 with postprocedural complications of urinary retention. 6/20 she presented with abdominal pain and was admitted for post-ERCP pancreatitis.  Assessment/Plan: 1. Acute pancreatitis, secondary to ERCP. appears stable. Perhaps a little bit better subjectively today. 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 Status post sphincterotomy 6/19. 3. Anemia. Normocytic. Stable. Suspect anemia of acute illness. 4. Hypokalemia. Replete with TNA. 5. Small volume hematochezia. Resolved. Likely related to sphincterotomy. 6. Rash. Likely related to heat/prolonged contact with sheet. Improved today spontaneously. 7. Urinary retention. Resolved, likely secondary to narcotics, postanesthesia.  8. Bilateral pleural effusions and generalized edema. Likely related to volume resuscitation for pancreatitis in the setting of hypoalbuminemia.  9. GERD. 10. History of bipolar disorder, major depressive disorder, chronic insomnia   Continue management of acute pancreatitis per gastroenterology. Continue TNA for nutrition.  Wean oxygen  Monitor for refeeding syndrome.  Code Status: full code DVT prophylaxis: Lovenox Family Communication: Husband at bedside Disposition Plan: home when improved  Brendia Sacksaniel Goodrich, MD  [facility]  Pager [phone] If 7PM-7AM, please contact night-coverage at [email or web address], password [email or web address] 07/26/2013, 8:47 AM  LOS: 7 days   Consultants:  Gastroenterology  Procedures:  ERCP Impression:  Normal pancreatogram.  Dilated CBD and CHD with short benign-appearing distal stricture.  Stricture was brushed for cytology.  10 French 5 cm long plastic stent placed across the  stricture.   Antibiotics:  Primaxin 6/21 >> 6/26  HPI/Subjective: Feels better today. Some nausea earlier, currently resolved. No vomiting. Rash improved.  Objective: Filed Vitals:   07/25/13 0532 07/25/13 1459 07/25/13 2052 07/26/13 0405  BP: 113/46 133/64 152/43 114/47  Pulse: 93 50 47 85  Temp: 98.4 F (36.9 C) 98.1 F (36.7 C) 99 F (37.2 C) 98.6 F (37 C)  TempSrc: Oral Oral Oral Oral  Resp: 20 20 20 20   Height:      Weight:      SpO2: 93% 93% 93% 92%    Intake/Output Summary (Last 24 hours) at 07/26/13 0847 Last data filed at 07/26/13 0617  Gross per 24 hour  Intake    150 ml  Output    650 ml  Net   -500 ml     Filed Weights   07/19/13 1457  Weight: 79.334 kg (174 lb 14.4 oz)    Exam:  Afebrile, vital signs are stable. Mild hypoxia on 2 L stable. Gen. Appears calm and comfortable.  Psych. Alert. Grossly normal mentation. Speech fluent and clear. Cardiovascular. Regular rate and rhythm. No murmur, rub or gallop. No lower extremity edema. Respiratory. Clear to auscultation bilaterally. No wheezes, rales or rhonchi. Normal respiratory effort Abdomen. Soft Skin. Rash on back improving. Maculopapular.  Data Reviewed: Chemistry: Potassium 3.0. Phosphorus 3.5. Magnesium normal. Remainder of basic metabolic panel unremarkable. Heme: Hemoglobin stable 10.4  Scheduled Meds: . amLODipine  5 mg Oral Daily  . ARIPiprazole  10 mg Oral QHS  . calcium carbonate  1 tablet Oral BID  . enoxaparin (LOVENOX) injection  40 mg Subcutaneous Q24H  . fat emulsion  100 mL Intravenous Q12H  . hydrocortisone cream   Topical BID  . insulin aspart  0-9 Units Subcutaneous 6 times per day  . lamoTRIgine  200 mg Oral Daily  . losartan  100 mg Oral Daily  . metoprolol  tartrate  12.5 mg Oral BID  . pantoprazole (PROTONIX) IV  40 mg Intravenous Q12H  . sodium chloride  10-40 mL Intracatheter Q12H  . vitamin B-12  1,000 mcg Oral Daily  . zolpidem  5 mg Oral QHS   Continuous  Infusions: . Marland Kitchen.TPN (CLINIMIX-E) Adult 30 mL/hr at 07/25/13 1753  . sodium chloride 60 mL/hr at 07/25/13 1907    Principal Problem:   Acute pancreatitis Active Problems:   Drug-induced pancreatitis DUE TO CONTRAST   Benign essential HTN   Urinary retention   Anemia   Time spent 15 minutes

## 2013-07-26 NOTE — Progress Notes (Signed)
Lidoderm patch ordered for patient's back pain and right lower lumbar region.

## 2013-07-26 NOTE — Progress Notes (Signed)
CONSULT NOTE  Pharmacy Consult for TPN Indication: Malnutrition  Pharmacy Consult for Primaxin  Indication: intra-abdominal infection  Allergies  Allergen Reactions  . Codeine Nausea And Vomiting    Patient Measurements: Height: 5\' 4"  (162.6 cm) Weight: 174 lb 14.4 oz (79.334 kg) IBW/kg (Calculated) : 54.7 Adjusted Body Weight: 62 kg  Vital Signs: Temp: 98.6 F (37 C) (06/27 0405) Temp src: Oral (06/27 0405) BP: 114/47 mmHg (06/27 0405) Pulse Rate: 85 (06/27 0405) Intake/Output from previous day: 06/26 0701 - 06/27 0700 In: 150 [P.O.:150] Out: 650 [Urine:650] Intake/Output from this shift:    Labs:  Recent Labs  07/24/13 0515 07/25/13 0508 07/26/13 0614  WBC 8.6 10.0 10.6*  HGB 10.3* 9.9* 10.4*  HCT 30.7* 29.7* 31.3*  PLT 211 237 247     Recent Labs  07/24/13 0515 07/24/13 1830 07/25/13 0508 07/26/13 0614  NA 139 137 138 137  K 3.3* 3.1* 3.2* 3.0*  CL 99 95* 98 95*  CO2 27 28 29 31   GLUCOSE 124* 110* 146* 129*  BUN 7 6 6  5*  CREATININE 0.55 0.56 0.62 0.65  CALCIUM 8.4 8.4 8.3* 8.3*  MG  --   --  1.7 1.8  PHOS  --   --  1.8* 3.5  PROT 5.9* 6.1  --  5.9*  ALBUMIN 2.2* 2.2*  --  2.1*  AST 101* 60*  --  27  ALT 49* 40*  --  25  ALKPHOS 357* 335*  --  292*  BILITOT 0.5 0.5  --  0.3  PREALBUMIN  --   --  7.2*  --   TRIG  --   --  95 109   Estimated Creatinine Clearance: 64.7 ml/min (by C-G formula based on Cr of 0.65).    Recent Labs  07/25/13 2344 07/26/13 0408 07/26/13 0714  GLUCAP 133* 129* 121*    Medical History: Past Medical History  Diagnosis Date  . GERD (gastroesophageal reflux disease)   . Bipolar 2 disorder   . Major depressive disorder   . Chronic insomnia   . Non-toxic multinodular goiter   . Osteopenia   . Vitamin D deficiency   . Migraine   . Elevated LFTs   . Diverticulosis of colon   . H/O first degree atrioventricular block   . Fatty liver disease, nonalcoholic   . Hypertension   . Hypercholesteremia   .  Sleep apnea     Stop Bang score of 4. Pt said she had sleep apnea at one time, but she has had another sleep study since then and they said she does not have it anymore.    Medications:  Scheduled:  . amLODipine  5 mg Oral Daily  . ARIPiprazole  10 mg Oral QHS  . calcium carbonate  1 tablet Oral BID  . enoxaparin (LOVENOX) injection  40 mg Subcutaneous Q24H  . fat emulsion  100 mL Intravenous Q12H  . hydrocortisone cream   Topical BID  . insulin aspart  0-9 Units Subcutaneous 6 times per day  . lamoTRIgine  200 mg Oral Daily  . losartan  100 mg Oral Daily  . metoprolol tartrate  12.5 mg Oral BID  . pantoprazole (PROTONIX) IV  40 mg Intravenous Q12H  . sodium chloride  10-40 mL Intracatheter Q12H  . vitamin B-12  1,000 mcg Oral Daily  . zolpidem  5 mg Oral QHS   Anti-infectives   Start     Dose/Rate Route Frequency Ordered Stop   07/20/13 2200  imipenem-cilastatin (  PRIMAXIN) 500 mg in sodium chloride 0.9 % 100 mL IVPB  Status:  Discontinued     500 mg 200 mL/hr over 30 Minutes Intravenous 3 times per day 07/20/13 1858 07/25/13 1421      Insulin Requirements in the past 24 hours:  1 units SSI  Current Nutrition:  NPO  Assessment: 73 yo F who has contrast-induced pancreatitis s/p ERCP.  She has been NPO since procedure on 6/19.  LFTs elevated, but trending down. TGs 109.  Prealbumin 7.2. She has no hx of DM and glucose has been <150.   Potassium low, phosphorous improved.  Magnesium in goal range.  Calcium corrects to normal when account for low albumin. Renal function is at patient's baseline. I/O + 851.7 on 6/26. Micro (-).  Nutritional Goals:  1860-2170 kCal/day, 55-100 grams of protein per day  Plan:   Increase Clinimix E 5/15 at 3650ml/hr.  Titrate to goal rate (75-7480ml/hr) as tolerated.   Add MVI and minerals to TPN daily  Intralipid 20% at 8110ml/hr  DecreaseIVF rate to 9350ml/hr today.    Start CBGs q6h with sensitive sliding scale if needed  KCL 10meq RUN IV x  4  F/U labs  Continue Primaxin at current dose.  Lamonte RicherHayes, Michael R 07/26/2013,10:32 AM

## 2013-07-27 LAB — GLUCOSE, CAPILLARY
GLUCOSE-CAPILLARY: 100 mg/dL — AB (ref 70–99)
GLUCOSE-CAPILLARY: 110 mg/dL — AB (ref 70–99)
GLUCOSE-CAPILLARY: 112 mg/dL — AB (ref 70–99)
GLUCOSE-CAPILLARY: 125 mg/dL — AB (ref 70–99)
Glucose-Capillary: 138 mg/dL — ABNORMAL HIGH (ref 70–99)
Glucose-Capillary: 142 mg/dL — ABNORMAL HIGH (ref 70–99)

## 2013-07-27 LAB — BASIC METABOLIC PANEL
BUN: 8 mg/dL (ref 6–23)
CHLORIDE: 94 meq/L — AB (ref 96–112)
CO2: 32 mEq/L (ref 19–32)
CREATININE: 0.58 mg/dL (ref 0.50–1.10)
Calcium: 8.4 mg/dL (ref 8.4–10.5)
GFR calc Af Amer: >90 mL/min (ref >90)
GFR calc non Af Amer: 90 mL/min — ABNORMAL LOW (ref >90)
GLUCOSE: 141 mg/dL — AB (ref 70–99)
Potassium: 2.9 mEq/L — CL (ref 3.7–5.3)
Sodium: 137 mEq/L (ref 137–147)

## 2013-07-27 LAB — PHOSPHORUS: Phosphorus: 3.2 mg/dL (ref 2.3–4.6)

## 2013-07-27 MED ORDER — POTASSIUM CHLORIDE 10 MEQ/100ML IV SOLN
10.0000 meq | INTRAVENOUS | Status: AC
  Administered 2013-07-27 (×4): 10 meq via INTRAVENOUS
  Filled 2013-07-27 (×4): qty 100

## 2013-07-27 MED ORDER — FAT EMULSION 20 % IV EMUL
250.0000 mL | INTRAVENOUS | Status: AC
Start: 2013-07-27 — End: 2013-07-28
  Administered 2013-07-27: 250 mL via INTRAVENOUS
  Filled 2013-07-27: qty 250

## 2013-07-27 MED ORDER — SODIUM CHLORIDE 0.9 % IV SOLN
INTRAVENOUS | Status: DC
  Administered 2013-07-27 – 2013-07-31 (×3): via INTRAVENOUS

## 2013-07-27 MED ORDER — FUROSEMIDE 40 MG PO TABS
40.0000 mg | ORAL_TABLET | Freq: Every day | ORAL | Status: DC
  Administered 2013-07-28 – 2013-07-30 (×3): 40 mg via ORAL
  Filled 2013-07-27 (×4): qty 1

## 2013-07-27 MED ORDER — SODIUM CHLORIDE 0.9 % IV SOLN: INTRAVENOUS | Status: DC

## 2013-07-27 MED ORDER — TRACE MINERALS CR-CU-F-FE-I-MN-MO-SE-ZN IV SOLN
INTRAVENOUS | Status: AC
  Administered 2013-07-27: 18:00:00 via INTRAVENOUS
  Filled 2013-07-27: qty 2000

## 2013-07-27 NOTE — Progress Notes (Signed)
CONSULT NOTE  Pharmacy Consult for TPN Indication: Malnutrition  Pharmacy Consult for Primaxin  Indication: intra-abdominal infection  Allergies  Allergen Reactions  . Codeine Nausea And Vomiting    Patient Measurements: Height: 5\' 4"  (162.6 cm) Weight: 174 lb 14.4 oz (79.334 kg) IBW/kg (Calculated) : 54.7 Adjusted Body Weight: 62 kg  Vital Signs: Temp: 99.3 F (37.4 C) (06/28 0416) Temp src: Oral (06/28 0416) BP: 167/61 mmHg (06/28 0416) Pulse Rate: 46 (06/28 0416) Intake/Output from previous day: 06/27 0701 - 06/28 0700 In: 150 [P.O.:150] Out: 2250 [Urine:2250] Intake/Output from this shift:    Labs:  Recent Labs  07/25/13 0508 07/26/13 0614  WBC 10.0 10.6*  HGB 9.9* 10.4*  HCT 29.7* 31.3*  PLT 237 247     Recent Labs  07/24/13 1830 07/25/13 0508 07/26/13 0614 07/27/13 0759  NA 137 138 137 137  K 3.1* 3.2* 3.0* 2.9*  CL 95* 98 95* 94*  CO2 28 29 31  32  GLUCOSE 110* 146* 129* 141*  BUN 6 6 5* 8  CREATININE 0.56 0.62 0.65 0.58  CALCIUM 8.4 8.3* 8.3* 8.4  MG  --  1.7 1.8  --   PHOS  --  1.8* 3.5 3.2  PROT 6.1  --  5.9*  --   ALBUMIN 2.2*  --  2.1*  --   AST 60*  --  27  --   ALT 40*  --  25  --   ALKPHOS 335*  --  292*  --   BILITOT 0.5  --  0.3  --   PREALBUMIN  --  7.2* 7.1*  --   TRIG  --  95 109  --    Estimated Creatinine Clearance: 64.7 ml/min (by C-G formula based on Cr of 0.58).    Recent Labs  07/26/13 2358 07/27/13 0414 07/27/13 0743  GLUCAP 153* 138* 142*    Medical History: Past Medical History  Diagnosis Date  . GERD (gastroesophageal reflux disease)   . Bipolar 2 disorder   . Major depressive disorder   . Chronic insomnia   . Non-toxic multinodular goiter   . Osteopenia   . Vitamin D deficiency   . Migraine   . Elevated LFTs   . Diverticulosis of colon   . H/O first degree atrioventricular block   . Fatty liver disease, nonalcoholic   . Hypertension   . Hypercholesteremia   . Sleep apnea     Stop Bang  score of 4. Pt said she had sleep apnea at one time, but she has had another sleep study since then and they said she does not have it anymore.    Medications:  Scheduled:  . amLODipine  5 mg Oral Daily  . ARIPiprazole  10 mg Oral QHS  . calcium carbonate  1 tablet Oral BID  . enoxaparin (LOVENOX) injection  40 mg Subcutaneous Q24H  . furosemide  20 mg Oral Daily  . hydrocortisone cream   Topical BID  . insulin aspart  0-9 Units Subcutaneous 6 times per day  . lamoTRIgine  200 mg Oral Daily  . lidocaine  1 patch Transdermal Q24H  . losartan  100 mg Oral Daily  . metoprolol tartrate  12.5 mg Oral BID  . pantoprazole (PROTONIX) IV  40 mg Intravenous Q12H  . sodium chloride  10-40 mL Intracatheter Q12H  . vitamin B-12  1,000 mcg Oral Daily  . zolpidem  5 mg Oral QHS   Anti-infectives   Start     Dose/Rate Route  Frequency Ordered Stop   07/20/13 2200  imipenem-cilastatin (PRIMAXIN) 500 mg in sodium chloride 0.9 % 100 mL IVPB  Status:  Discontinued     500 mg 200 mL/hr over 30 Minutes Intravenous 3 times per day 07/20/13 1858 07/25/13 1421      Insulin Requirements in the past 24 hours:  4 units SSI  Current Nutrition:  NPO  Assessment: 73 yo F who has contrast-induced pancreatitis s/p ERCP.  She has been NPO since procedure on 6/19.  LFTs elevated, but trending down. TGs 109.  Prealbumin 7.2. She has no hx of DM and glucose has been <150.   Potassium low despite increase in rate at 4 runs of 10meq.  Calcium normal.  Renal function is at patient's baseline. I/O not completed 6/27. Micro (-).  Nutritional Goals:  1860-2170 kCal/day, 55-100 grams of protein per day  Plan:   Increase Clinimix E 5/15 at 1660ml/hr.  Titrate to goal rate (75-7380ml/hr) as tolerated.   Add MVI and minerals to TPN daily  Intralipid 20% at 8410ml/hr  DecreaseIVF rate to 4150ml/hr today.    Start CBGs q6h with sensitive sliding scale if needed  KCL 10meq RUN IV x 4 already ordered by MD.  F/U  labs  Continue Primaxin at current dose.  Lamonte RicherHayes, Michael R 07/27/2013,8:55 AM

## 2013-07-27 NOTE — Progress Notes (Signed)
Subjective; Patient continues to complain of pain in her right lower lumbar region. She feels patch is helping but she still needs IV pain medication intermittently. She denies nausea. She does not have an appetite. She complains of soreness across upper abdomen. She denies shortness of breath or chest pain. She is passing flatus. Last BM was 2 days ago when she was given suppository.    Objective; BP 168/63  Pulse 80  Temp(Src) 99.3 F (37.4 C) (Oral)  Resp 20  Ht 5\' 4"  (1.626 m)  Wt 174 lb 14.4 oz (79.334 kg)  BMI 30.01 kg/m2  SpO2 91% Patient is sitting and reclining chair and appears to be comfortable. Maculopapular rash over upper and lower back appears more pronounced today at left lower back. Cardiac exam with occasional irregularity; normal S1 and  S2 no murmur noted. Auscultation of the lungs reveals vesicular breath sounds bilaterally without rales or rhonchi.  Abdomen is distended. Bowel sounds are normal. Abdomen is soft across lower half. She remains with moderate midepigastric tenderness more to the right of midline. No organomegaly or masses. She has 2+ pitting edema involving both lower extremities.  Urine output 650 mL last 24 hours(not all urine measured)  Lab data;  Serum sodium 137, potassium 2.9, chloride 94, CO2 32, BUN 8, creatinine 0.58 Glucose 141   Assessment;  #1.Post ERCP pancreatitis. Patient remains n.p.o. except by mouth medications. She is not having much in the day of abdominal pain but most of her pain is in the right lower lumbar region which may or may not be related to pancreatitis. She has history of low back pain and has been on NSAID which is on hold. She needs followup CT to make sure she does not have developed pseudocysts. #2. Benign distal CBD stricture. Status post ERCP with sphincterotomy and stenting on 07/18/2013 complicated by pancreatitis. Biliary stent in good position on CT of 07/21/2013. #3. Anemia.  #4. Malnutrition. Patient is  tolerating parenteral nutrition; rate to be escalated later today. #5. Hypokalemia. Patient received IV potassium for portal of 40 mEq over 4 hours. #6. Lower extremity edema. She is on Lasix by mouth begun yesterday. Edema would appear to be secondary to hypoalbuminemia.  Recommendations; Continue NPO except for by mouth meds and ice chips. Abdominal pelvic CT with contrast in a.m. Repeat labs in a.m.

## 2013-07-27 NOTE — Progress Notes (Signed)
CRITICAL VALUE ALERT  Critical value received:  Potassium 2.9  Date of notification:  07/27/13  Time of notification:  0850  Critical value read back:yes  Nurse who received alert: Riki AltesK gibson RN  MD notified (1st page):  Irene LimboGoodrich  Time of first page:  [phone]  MD notified (2nd page):  Time of second page:  Responding MD:  Dr Irene LimboGoodrich  Time MD responded:  [phone]

## 2013-07-27 NOTE — Progress Notes (Addendum)
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 She underwent ERCP 6/19 with postprocedural complications of urinary retention. 6/20 she presented with abdominal pain and was admitted for post-ERCP pancreatitis.  Assessment/Plan: 1. Acute pancreatitis, secondary to ERCP. stable. Clinically improving. Still does have some epigastric discomfort. 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 Status post sphincterotomy 6/19. 3. Anemia. Normocytic. Stable. Suspect anemia of acute illness. 4. Hypokalemia. Replete. 5. Small volume hematochezia. Resolved. Likely related to sphincterotomy. 6. Rash. Likely related to heat/prolonged contact with sheet.  7. Urinary retention resolved.   8. Bilateral pleural effusions and generalized edema. Likely related to volume resuscitation for pancreatitis in the setting of hypoalbuminemia.  9. GERD. 10. History of bipolar disorder, major depressive disorder, chronic insomnia   Continue management of acute pancreatitis per gastroenterology. Continue TNA for nutrition.  Wean oxygen  Replete potassium.  Monitor for refeeding syndrome.  Increase Lasix  Code Status: full code DVT prophylaxis: Lovenox Family Communication: Husband at bedside, discussed care 6/28 Disposition Plan: home when improved  Brendia Sacksaniel Goodrich, MD  [facility]  Pager [phone] If 7PM-7AM, please contact night-coverage at [email or web address], password [email or web address] 07/27/2013, 10:16 AM  LOS: 8 days   Consultants:  Gastroenterology  Procedures:  ERCP Impression:  Normal pancreatogram.  Dilated CBD and CHD with short benign-appearing distal stricture.  Stricture was brushed for cytology.  10 French 5 cm long plastic stent placed across the  stricture.   Antibiotics:  Primaxin 6/21 >> 6/26  HPI/Subjective: Feeling better today. Rash improving. Some abdominal discomfort.  Objective: Filed Vitals:   07/26/13 1700 07/26/13 1950 07/27/13 0416 07/27/13 0900  BP:  132/49 167/61 168/63  Pulse:  46 46 80  Temp: 98.8 F (37.1 C) 99 F (37.2 C) 99.3 F (37.4 C)   TempSrc: Oral Oral Oral   Resp:  20 20   Height:      Weight:      SpO2:  92% 91%     Intake/Output Summary (Last 24 hours) at 07/27/13 1016 Last data filed at 07/27/13 0919  Gross per 24 hour  Intake  596.6 ml  Output   2250 ml  Net -1653.4 ml     Filed Weights   07/19/13 1457  Weight: 79.334 kg (174 lb 14.4 oz)    Exam:  Afebrile. Vitals stable. Gen. Appears calm, comfortable. Psych. Alert. Speech fluent and clear. Cardiovascular. Regular rate and rhythm. No rub or gallop. 2+ BLE edema. Respiratory. Clear to auscultation bilaterally. No wheezes, rales or rhonchi. Normal respiratory effort. Abdomen. Soft. Some epigastric pain. Skin. Rash on back without significant change today. Maculopapular.  Data Reviewed: I/O: Excellent urine output, 2250. Chemistry: Blood sugars stable. Potassium 2.9. Creatinine within normal limits. Phosphorus within normal limits.  Scheduled Meds: . amLODipine  5 mg Oral Daily  . ARIPiprazole  10 mg Oral QHS  . calcium carbonate  1 tablet Oral BID  . enoxaparin (LOVENOX) injection  40 mg Subcutaneous Q24H  . furosemide  20 mg Oral Daily  . hydrocortisone cream   Topical BID  . insulin aspart  0-9 Units Subcutaneous 6 times per day  . lamoTRIgine  200 mg Oral Daily  . lidocaine  1 patch Transdermal Q24H  . losartan  100 mg Oral Daily  . metoprolol tartrate  12.5 mg Oral BID  . pantoprazole (PROTONIX) IV  40 mg Intravenous Q12H  . potassium chloride  10 mEq Intravenous Q1 Hr x 4  . sodium chloride  10-40 mL Intracatheter Q12H  . vitamin B-12  1,000 mcg Oral Daily  . zolpidem  5 mg Oral QHS   Continuous  Infusions: . sodium chloride    . Marland Kitchen.TPN (CLINIMIX-E) Adult 50 mL/hr at 07/27/13 0919   And  . fat emulsion 31.42 kcal (07/27/13 0919)  . Marland Kitchen.TPN (CLINIMIX-E) Adult     And  . fat emulsion      Principal Problem:   Acute pancreatitis Active Problems:   Drug-induced pancreatitis DUE TO CONTRAST   Benign essential HTN   Urinary retention   Anemia   Time spent 15 minutes

## 2013-07-28 ENCOUNTER — Inpatient Hospital Stay (HOSPITAL_COMMUNITY): Payer: Medicare Other

## 2013-07-28 LAB — COMPREHENSIVE METABOLIC PANEL
ALT: 18 U/L (ref 0–35)
AST: 26 U/L (ref 0–37)
Albumin: 2.2 g/dL — ABNORMAL LOW (ref 3.5–5.2)
Alkaline Phosphatase: 322 U/L — ABNORMAL HIGH (ref 39–117)
BUN: 12 mg/dL (ref 6–23)
CALCIUM: 8.6 mg/dL (ref 8.4–10.5)
CO2: 32 mEq/L (ref 19–32)
CREATININE: 0.63 mg/dL (ref 0.50–1.10)
Chloride: 96 mEq/L (ref 96–112)
GFR calc Af Amer: >90 mL/min (ref >90)
GFR, EST NON AFRICAN AMERICAN: 87 mL/min — AB (ref >90)
Glucose, Bld: 131 mg/dL — ABNORMAL HIGH (ref 70–99)
Potassium: 3.1 mEq/L — ABNORMAL LOW (ref 3.7–5.3)
SODIUM: 141 meq/L (ref 137–147)
Total Bilirubin: 0.3 mg/dL (ref 0.3–1.2)
Total Protein: 6.3 g/dL (ref 6.0–8.3)

## 2013-07-28 LAB — PHOSPHORUS: PHOSPHORUS: 4.1 mg/dL (ref 2.3–4.6)

## 2013-07-28 LAB — GLUCOSE, CAPILLARY
GLUCOSE-CAPILLARY: 125 mg/dL — AB (ref 70–99)
GLUCOSE-CAPILLARY: 114 mg/dL — AB (ref 70–99)
Glucose-Capillary: 125 mg/dL — ABNORMAL HIGH (ref 70–99)
Glucose-Capillary: 96 mg/dL (ref 70–99)
Glucose-Capillary: 128 mg/dL — ABNORMAL HIGH (ref 70–99)

## 2013-07-28 LAB — DIFFERENTIAL
Basophils Absolute: 0 10*3/uL (ref 0.0–0.1)
Basophils Relative: 0 % (ref 0–1)
EOS PCT: 3 % (ref 0–5)
Eosinophils Absolute: 0.4 10*3/uL (ref 0.0–0.7)
LYMPHS ABS: 2 10*3/uL (ref 0.7–4.0)
Lymphocytes Relative: 15 % (ref 12–46)
MONO ABS: 1.4 10*3/uL — AB (ref 0.1–1.0)
Monocytes Relative: 11 % (ref 3–12)
NEUTROS ABS: 9.1 10*3/uL — AB (ref 1.7–7.7)
Neutrophils Relative %: 71 % (ref 43–77)

## 2013-07-28 LAB — CBC
HCT: 29.3 % — ABNORMAL LOW (ref 36.0–46.0)
HEMATOCRIT: 17.9 % — AB (ref 36.0–46.0)
Hemoglobin: 5.9 g/dL — CL (ref 12.0–15.0)
Hemoglobin: 9.7 g/dL — ABNORMAL LOW (ref 12.0–15.0)
MCH: 30.4 pg (ref 26.0–34.0)
MCH: 30.4 pg (ref 26.0–34.0)
MCHC: 33 g/dL (ref 30.0–36.0)
MCHC: 33.1 g/dL (ref 30.0–36.0)
MCV: 92.3 fL (ref 78.0–100.0)
MCV: 91.8 fL (ref 78.0–100.0)
PLATELETS: 361 10*3/uL (ref 150–400)
PLATELETS: 258 10*3/uL (ref 150–400)
RBC: 1.94 MIL/uL — AB (ref 3.87–5.11)
RBC: 3.19 MIL/uL — ABNORMAL LOW (ref 3.87–5.11)
RDW: 14.1 % (ref 11.5–15.5)
RDW: 14.1 % (ref 11.5–15.5)
WBC: 12.9 10*3/uL — AB (ref 4.0–10.5)
WBC: 9.6 10*3/uL (ref 4.0–10.5)

## 2013-07-28 LAB — PREALBUMIN: Prealbumin: 8 mg/dL — ABNORMAL LOW (ref 17.0–34.0)

## 2013-07-28 LAB — TRIGLYCERIDES: TRIGLYCERIDES: 113 mg/dL (ref <150)

## 2013-07-28 LAB — MAGNESIUM: Magnesium: 1.9 mg/dL (ref 1.5–2.5)

## 2013-07-28 MED ORDER — IOHEXOL 300 MG/ML  SOLN
50.0000 mL | Freq: Once | INTRAMUSCULAR | Status: AC | PRN
  Administered 2013-07-28: 50 mL via ORAL

## 2013-07-28 MED ORDER — POTASSIUM CHLORIDE 10 MEQ/100ML IV SOLN
10.0000 meq | INTRAVENOUS | Status: AC
  Administered 2013-07-28 (×4): 10 meq via INTRAVENOUS
  Filled 2013-07-28 (×2): qty 100

## 2013-07-28 MED ORDER — TRACE MINERALS CR-CU-F-FE-I-MN-MO-SE-ZN IV SOLN
INTRAVENOUS | Status: AC
  Administered 2013-07-28: 18:00:00 via INTRAVENOUS
  Filled 2013-07-28: qty 2000

## 2013-07-28 MED ORDER — IOHEXOL 300 MG/ML  SOLN
100.0000 mL | Freq: Once | INTRAMUSCULAR | Status: AC | PRN
  Administered 2013-07-28: 100 mL via INTRAVENOUS

## 2013-07-28 MED ORDER — FAT EMULSION 20 % IV EMUL
250.0000 mL | INTRAVENOUS | Status: AC
  Administered 2013-07-28: 250 mL via INTRAVENOUS
  Filled 2013-07-28: qty 250

## 2013-07-28 NOTE — Progress Notes (Addendum)
Patient has 02 sat of 96% on oxygen at 2L, 02 sat of 92% on 1L of 02 and an 02 sat of 87% on room air. Oxygen set at 1 liter, resp called to bring incentive spirometry.

## 2013-07-28 NOTE — Progress Notes (Signed)
Patient seen, independently examined and chart reviewed. I agree with exam, assessment and plan discussed with Toya SmothersKaren Black, NP.  Subjective: Overall feeling better. Rash on her back no longer bothering her, no pruritus. Still some mild abdominal pain.  Objective: Afebrile, vital signs stable. Minimal hypoxia, stable on 2 L.  Gen. Appears calm, comfortable.  Psych. Alert. Speech fluent and clear.  Cardiovascular. Regular rate and rhythm. No murmur, rub or gallop.  Respiratory. Clear to auscultation bilaterally. No wheezes, rales or rhonchi. Normal respiratory effort.  Abdomen. Soft   I/O: Excellent urine output.  Chemistry: Capillary blood sugars stable.   Heme: Hemoglobin 9.7. Leukocytosis has resolved.  She continues to improve symptomatically, remains hemodynamically stable, afebrile with reassuring blood work. Rash is resolving. Discussed with Dr. Karilyn Cotaehman, plan is to followup a CT scan today. Given her clinical improvement she may be old go home soon.  Brendia Sacksaniel Goodrich, MD [facility] [phone]

## 2013-07-28 NOTE — Progress Notes (Signed)
Subjective; Patient states she is having much less pain in her back. She felt hungry last night. She denies nausea vomiting chest pain or shortness of breath. She feels less distended her abdomen. She still has soreness in the epigastric region but she states is slowly getting better. She has no difficulty voiding.   Objective; BP 145/73  Pulse 77  Temp(Src) 97.5 F (36.4 C) (Oral)  Resp 20  Ht 5\' 4"  (1.626 m)  Wt 174 lb 14.4 oz (79.334 kg)  BMI 30.01 kg/m2  SpO2 92% Patient is alert and in no acute distress. Cardiac exam is intermittent irregularity. Normal S1 and S2. She has faint systolic ejection murmur best heard at left sternal border. Lungs are clear to auscultation. Abdomen remains full. Bowel sounds are normal. On palpation at mild to moderate midepigastric tenderness. There is no guarding. No organomegaly or masses. Trace edema around the ankles.  Urine output 2450 mL last 24-hours.  Lab data; WBC 9.6. H&H 9.7 and 29.3 and platelet count 258K(Vesanoid earlier hemoglobin of 1.9 and was felt to be an error and therefore repeated).  Serum sodium 141, potassium 3.1, chloride 94, CO2 32, BUN 12, creatinine 0.63 Glucose 131 Bilirubin 0.3, AP 332, AST 26, ALT 18, albumin 2.2 and serum calcium 8.6. Triglycerides 113 Magnesium 1.9 Serum phosphate 4.1   Assessment;  #1.Post ERCP pancreatitis. Patient appears to be gradually improving. She's been off antibiotics for 2 days. She remains afebrile. She has no evidence of acidosis or renal dysfunction. She is scheduled to undergo CT today to make sure she is not developing pseudocysts. #2. Benign distal CBD stricture. Status post ERCP with sphincterotomy and stenting on 07/18/2013 complicated by pancreatitis. Alkaline phosphatase is elevated her transaminases are normal. This abnormality was noted prior to her getting on TPN and therefore unrelated. Will be able to check stent position on CT to be done later today. #3. Anemia.  H&H is  stable. #4. Malnutrition. Patient is tolerating parenteral nutrition;  #5. Hypokalemia. Potassium dose to be adjusted in TPN. #6. Lower extremity edema. And the edema has decreased breath diuretic therapy.  Recommendations; Continue NPO status. Will review abdominal pelvic CT when completed. Continue TPN.

## 2013-07-28 NOTE — Progress Notes (Signed)
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 She underwent ERCP 6/19 with postprocedural complications of urinary retention. 6/20 she presented with abdominal pain and was admitted for post-ERCP pancreatitis.  Assessment/Plan: 1. Acute pancreatitis, secondary to ERCP. stable. Clinically improving. Still does have some epigastric discomfort. Reports feeling hungry this am. Remains NPO on TPN. Defer management to GI 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 Status post sphincterotomy 6/19. 3. Anemia. Normocytic. Stable. Suspect anemia of acute illness. 4. Hypokalemia. Replete. Recheck in am 5. Small volume hematochezia. Resolved. Likely related to sphincterotomy. 6. Rash. Likely related to heat/prolonged contact with sheet.  7. Urinary retention resolved.  8. Bilateral pleural effusions and generalized edema. Likely related to volume resuscitation for pancreatitis in the setting of hypoalbuminemia. Lasix increased. 9. GERD. 10. History of bipolar disorder, major depressive disorder, chronic insomnia   Code Status: full Family Communication: none present Disposition Plan: home   Consultants:  gastroenterology  Procedures: ERCP Impression:  Normal pancreatogram.  Dilated CBD and CHD with short benign-appearing distal stricture.  Stricture was brushed for cytology.  10 French 5 cm long plastic stent placed across the stricture   Antibiotics: Primaxin 6/21 >> 6/26   HPI/Subjective: Alert sitting up in bed watching tv. Reports feeling better this am.   Objective: Filed Vitals:   07/28/13 0506  BP: 145/73  Pulse: 77  Temp: 97.5 F (36.4 C)  Resp: 20    Intake/Output Summary (Last 24 hours) at 07/28/13 1013 Last data filed at  07/28/13 [id number]  Gross per 24 hour  Intake 1629.14 ml  Output   2450 ml  Net -820.86 ml   Filed Weights   07/19/13 1457  Weight: 79.334 kg (174 lb 14.4 oz)    Exam:   General:  Well nourished NAD  Cardiovascular: RRR No MGR trace-1+ LE edema  Respiratory: normal effort BS clear bilaterally no wheeze  Abdomen: round but soft +BS mild diffuse tenderness to palpation  Musculoskeletal: no clubbing or cyanoss  Data Reviewed: Basic Metabolic Panel:  Recent Labs Lab 07/24/13 1830 07/25/13 0508 07/26/13 0614 07/27/13 0759 07/28/13 0558  NA 137 138 137 137 141  K 3.1* 3.2* 3.0* 2.9* 3.1*  CL 95* 98 95* 94* 96  CO2 28 29 31  32 32  GLUCOSE 110* 146* 129* 141* 131*  BUN 6 6 5* 8 12  CREATININE 0.56 0.62 0.65 0.58 0.63  CALCIUM 8.4 8.3* 8.3* 8.4 8.6  MG  --  1.7 1.8  --  1.9  PHOS  --  1.8* 3.5 3.2 4.1   Liver Function Tests:  Recent Labs Lab 07/24/13 0515 07/24/13 1830 07/26/13 0614 07/28/13 0558  AST 101* 60* 27 26  ALT 49* 40* 25 18  ALKPHOS 357* 335* 292* 322*  BILITOT 0.5 0.5 0.3 0.3  PROT 5.9* 6.1 5.9* 6.3  ALBUMIN 2.2* 2.2* 2.1* 2.2*    Recent Labs Lab 07/23/13 0453 07/24/13 0515  LIPASE 17  --   AMYLASE 44 40   No results found for this basename: AMMONIA,  in the last 168 hours CBC:  Recent Labs Lab 07/24/13 0515 07/25/13 0508 07/26/13 0614 07/28/13 0558 07/28/13 0757  WBC 8.6 10.0 10.6* 12.9* 9.6  NEUTROABS  --   --  7.9* 9.1*  --   HGB 10.3* 9.9* 10.4* 5.9* 9.7*  HCT 30.7* 29.7* 31.3* 17.9* 29.3*  MCV 91.9 92.0 92.1 92.3 91.8  PLT 211 237 247 361 258   Cardiac Enzymes: No results found for this basename: CKTOTAL, CKMB, CKMBINDEX, TROPONINI,  in the last 168 hours BNP (last 3 results) No results found for this basename: PROBNP,  in the last 8760 hours CBG:  Recent Labs Lab 07/27/13 1613 07/27/13 1959 07/27/13 2339 07/28/13 0259 07/28/13 0741  GLUCAP 110* 125* 112* 125* 125*    Recent Results (from the past 240 hour(s))   CULTURE, BLOOD (ROUTINE X 2)     Status: None   Collection Time    07/20/13  6:01 PM      Result Value Ref Range Status   Specimen Description BLOOD RIGHT HAND   Final   Special Requests BOTTLES DRAWN AEROBIC ONLY 6CC   Final   Culture NO GROWTH 5 DAYS   Final   Report Status 07/25/2013 FINAL   Final  CULTURE, BLOOD (ROUTINE X 2)     Status: None   Collection Time    07/20/13  6:01 PM      Result Value Ref Range Status   Specimen Description BLOOD LEFT HAND   Final   Special Requests BOTTLES DRAWN AEROBIC ONLY 6CC   Final   Culture NO GROWTH 5 DAYS   Final   Report Status 07/25/2013 FINAL   Final     Studies: No results found.  Scheduled Meds: . amLODipine  5 mg Oral Daily  . ARIPiprazole  10 mg Oral QHS  . calcium carbonate  1 tablet Oral BID  . enoxaparin (LOVENOX) injection  40 mg Subcutaneous Q24H  . furosemide  40 mg Oral Daily  . hydrocortisone cream   Topical BID  . insulin aspart  0-9 Units Subcutaneous 6 times per day  . lamoTRIgine  200 mg Oral Daily  . lidocaine  1 patch Transdermal Q24H  . losartan  100 mg Oral Daily  . metoprolol tartrate  12.5 mg Oral BID  . pantoprazole (PROTONIX) IV  40 mg Intravenous Q12H  . potassium chloride  10 mEq Intravenous Q1 Hr x 4  . sodium chloride  10-40 mL Intracatheter Q12H  . vitamin B-12  1,000 mcg Oral Daily  . zolpidem  5 mg Oral QHS   Continuous Infusions: . sodium chloride 50 mL/hr at 07/27/13 1836  . sodium chloride Stopped (07/27/13 1837)  . Marland Kitchen.TPN (CLINIMIX-E) Adult 60 mL/hr at 07/27/13 1744   And  . fat emulsion 250 mL (07/27/13 1744)  . Marland Kitchen.TPN (CLINIMIX-E) Adult     And  . fat emulsion      Principal Problem:   Acute pancreatitis Active Problems:   Drug-induced pancreatitis DUE TO CONTRAST   Benign essential HTN   Urinary retention   Anemia    Time spent: 30 minutes    Richardson Medical CenterBLACK,KAREN M  [facility] Pager [phone]. If 7PM-7AM, please contact night-coverage at [email or web address], password  [facility] Fort WorthRH1 07/28/2013, 10:13 AM  LOS: 9 days

## 2013-07-28 NOTE — Progress Notes (Signed)
CRITICAL VALUE ALERT  Critical value received: hemoglobin  Date of notification: 07-28-13  Time of notification: 0700  Critical value read back:yes  Nurse who received alert:  b Milana Obeyjohnson, rn MD notified (1st page):  Dr. Irene Limbogoodrich  Time of first page:  0705  MD notified (2nd page):  Time of second page:  Responding MD: goodrich  md responded to page.  Ordered recheck of labs to ensure accuracy.   Time MD responded: [id number]

## 2013-07-28 NOTE — Progress Notes (Signed)
CONSULT NOTE  Pharmacy Consult for TPN Indication: Malnutrition  Pharmacy Consult for Primaxin  Indication: intra-abdominal infection  Allergies  Allergen Reactions  . Codeine Nausea And Vomiting    Patient Measurements: Height: 5\' 4"  (162.6 cm) Weight: 174 lb 14.4 oz (79.334 kg) IBW/kg (Calculated) : 54.7 Adjusted Body Weight: 62 kg  Vital Signs: Temp: 97.5 F (36.4 C) (06/29 0506) Temp src: Oral (06/29 0506) BP: 145/73 mmHg (06/29 0506) Pulse Rate: 77 (06/29 0506) Intake/Output from previous day: 06/28 0701 - 06/29 0700 In: 2075.7 [P.O.:450; I.V.:809.4; IV Piggyback:100; TPN:716.3] Out: 2450 [Urine:2450] Intake/Output from this shift:    Labs:  Recent Labs  07/26/13 0614 07/28/13 0558  WBC 10.6* 12.9*  HGB 10.4* 5.9*  HCT 31.3* 17.9*  PLT 247 361     Recent Labs  07/26/13 0614 07/27/13 0759 07/28/13 0558  NA 137 137 141  K 3.0* 2.9* 3.1*  CL 95* 94* 96  CO2 31 32 32  GLUCOSE 129* 141* 131*  BUN 5* 8 12  CREATININE 0.65 0.58 0.63  CALCIUM 8.3* 8.4 8.6  MG 1.8  --  1.9  PHOS 3.5 3.2 4.1  PROT 5.9*  --  6.3  ALBUMIN 2.1*  --  2.2*  AST 27  --  26  ALT 25  --  18  ALKPHOS 292*  --  322*  BILITOT 0.3  --  0.3  PREALBUMIN 7.1*  --   --   TRIG 109  --  113   Estimated Creatinine Clearance: 64.7 ml/min (by C-G formula based on Cr of 0.63).    Recent Labs  07/27/13 2339 07/28/13 0259 07/28/13 0741  GLUCAP 112* 125* 125*    Medical History: Past Medical History  Diagnosis Date  . GERD (gastroesophageal reflux disease)   . Bipolar 2 disorder   . Major depressive disorder   . Chronic insomnia   . Non-toxic multinodular goiter   . Osteopenia   . Vitamin D deficiency   . Migraine   . Elevated LFTs   . Diverticulosis of colon   . H/O first degree atrioventricular block   . Fatty liver disease, nonalcoholic   . Hypertension   . Hypercholesteremia   . Sleep apnea     Stop Bang score of 4. Pt said she had sleep apnea at one time, but  she has had another sleep study since then and they said she does not have it anymore.    Medications:  Scheduled:  . amLODipine  5 mg Oral Daily  . ARIPiprazole  10 mg Oral QHS  . calcium carbonate  1 tablet Oral BID  . enoxaparin (LOVENOX) injection  40 mg Subcutaneous Q24H  . furosemide  40 mg Oral Daily  . hydrocortisone cream   Topical BID  . insulin aspart  0-9 Units Subcutaneous 6 times per day  . lamoTRIgine  200 mg Oral Daily  . lidocaine  1 patch Transdermal Q24H  . losartan  100 mg Oral Daily  . metoprolol tartrate  12.5 mg Oral BID  . pantoprazole (PROTONIX) IV  40 mg Intravenous Q12H  . sodium chloride  10-40 mL Intracatheter Q12H  . vitamin B-12  1,000 mcg Oral Daily  . zolpidem  5 mg Oral QHS   Anti-infectives   Start     Dose/Rate Route Frequency Ordered Stop   07/20/13 2200  imipenem-cilastatin (PRIMAXIN) 500 mg in sodium chloride 0.9 % 100 mL IVPB  Status:  Discontinued     500 mg 200 mL/hr over 30  Minutes Intravenous 3 times per day 07/20/13 1858 07/25/13 1421      Insulin Requirements in the past 24 hours:  none  Current Nutrition:  NPO  Assessment: 73 yo F who has pancreatitis s/p ERCP.  She has been NPO since procedure on 6/19.  LFTs elevated, but stable. TGs 113.  Prealbumin 7.1. She has no hx of DM and glucose has been <150.   Potassium low despite increase in rate and 4 runs of KCl 10meq.  Calcium, Phos, Magnesium are at goal range.  Renal function is at patient's baseline. I/O -374.473ml. Low Hg noted.   Micro (-). Day#9 Primaxin.  Nutritional Goals:  Kcal: [id number]  Protein: 119-139 grams  Fluid: 1.9-2.1 L  Plan:   Continue Clinimix E 5/15 at 60 ml/hr until potassium corrected. Titrate to goal rate as tolerated.   Add MVI and minerals to TPN daily  Intralipid 20% at 5910ml/hr.  Watch TGs.  Continue IVF rate at 6150ml/hr today.    CBGs q4h with sensitive sliding scale if needed  KCL 10meq RUN IV x 4   F/U labs  Continue Primaxin at  current dose.  Duration per MD.   Elson ClanLilliston, Tashona Calk Michelle 07/28/2013,7:58 AM

## 2013-07-28 NOTE — Progress Notes (Signed)
Abdomino-pelvic CT reviewed with Dr. Jeronimo GreavesKyle Talbot. Pleural effusions have decreased slightly edema anterior to pancreas involving transverse mesocolon has increased there is no pseudocyst formation. No evidence of pancreatic necrosis. Biliary stent in place. Findings discussed with patient, her husband and their daughter and son-in-law. I would recommend continuing parenteral nutrition and rescan her in two weeks. I would not recommend initiating oral feeding at this time. Will consider switching her to oral medications and once she is unsteady state of TPN electrolytes are normal she should be able to go home.

## 2013-07-29 LAB — BASIC METABOLIC PANEL
BUN: 12 mg/dL (ref 6–23)
CO2: 32 mEq/L (ref 19–32)
CREATININE: 0.64 mg/dL (ref 0.50–1.10)
Calcium: 8.5 mg/dL (ref 8.4–10.5)
Chloride: 98 mEq/L (ref 96–112)
GFR, EST NON AFRICAN AMERICAN: 87 mL/min — AB (ref >90)
Glucose, Bld: 123 mg/dL — ABNORMAL HIGH (ref 70–99)
POTASSIUM: 3.1 meq/L — AB (ref 3.7–5.3)
Sodium: 142 mEq/L (ref 137–147)

## 2013-07-29 LAB — GLUCOSE, CAPILLARY
GLUCOSE-CAPILLARY: 136 mg/dL — AB (ref 70–99)
Glucose-Capillary: 102 mg/dL — ABNORMAL HIGH (ref 70–99)
Glucose-Capillary: 111 mg/dL — ABNORMAL HIGH (ref 70–99)
Glucose-Capillary: 125 mg/dL — ABNORMAL HIGH (ref 70–99)
Glucose-Capillary: 126 mg/dL — ABNORMAL HIGH (ref 70–99)
Glucose-Capillary: 126 mg/dL — ABNORMAL HIGH (ref 70–99)
Glucose-Capillary: 129 mg/dL — ABNORMAL HIGH (ref 70–99)

## 2013-07-29 MED ORDER — FAT EMULSION 20 % IV EMUL
250.0000 mL | INTRAVENOUS | Status: AC
  Administered 2013-07-29: 250 mL via INTRAVENOUS
  Filled 2013-07-29: qty 250

## 2013-07-29 MED ORDER — POTASSIUM CHLORIDE 10 MEQ/100ML IV SOLN
10.0000 meq | INTRAVENOUS | Status: AC
  Administered 2013-07-29 (×4): 10 meq via INTRAVENOUS
  Filled 2013-07-29 (×4): qty 100

## 2013-07-29 MED ORDER — OXYCODONE HCL 5 MG PO TABS
5.0000 mg | ORAL_TABLET | ORAL | Status: DC | PRN
  Administered 2013-07-29: 5 mg via ORAL
  Filled 2013-07-29: qty 1

## 2013-07-29 MED ORDER — CLINIMIX E/DEXTROSE (5/15) 5 % IV SOLN
INTRAVENOUS | Status: AC
  Administered 2013-07-29: 18:00:00 via INTRAVENOUS
  Filled 2013-07-29: qty 2000

## 2013-07-29 NOTE — Progress Notes (Signed)
Subjective; Patient continues to complain of intermittent pain and right lumbar region. She had 2 take IV pain medication for this pain when she woke up. She denies abdominal pain. She says she has some soreness in the epigastric region. She denies nausea vomiting or heartburn. She has no difficulty voiding. She did have diarrhea after she took contrast for CT yesterday.   Objective; BP 135/42  Pulse 74  Temp(Src) 98.7 F (37.1 C) (Oral)  Resp 20  Ht 5\' 4"  (1.626 m)  Wt 174 lb 14.4 oz (79.334 kg)  BMI 30.01 kg/m2  SpO2 92% Patient is alert and in no acute distress. Maculopapular rash over upper and lower back is unchanged from yesterday. Cardiac exam with regular rhythm. Normal S1 and S2. She has faint systolic ejection murmur best heard at left sternal border. Auscultation of lungs reveals vesicular breath sounds. She has few fine rales at left base the Abdomen is full but not as distended as yesterday. Bowel sounds are normal. Abdomen is soft with fullness and mild epigastric tenderness. No organomegaly or masses. She has 1-2+ pitting edema involving both legs.  Urine output 2900 mL last 24-hours.  Lab data;  Serum sodium 142, potassium 3.1, chloride 98, CO2 32, BUN 12, creatinine 0.64 Glucose 87. Serum calcium 8.5.    Assessment;  #1.Post ERCP pancreatitis. Patient appears to be slowly improving. Her abdomen appears less distended than yesterday. Abdominopelvic CT yesterday revealed increase in edema anterior to pancreas and in transverse mesocolon. Pancreas enhanced. No evidence of pseudocyst. She will therefore need to be n.p.o. except by mouth medications. #2. Benign distal CBD stricture. Status post ERCP with sphincterotomy and stenting on 07/18/2013. CT yesterday reveals biliary stent to be in stable position. #4. Malnutrition. Patient is tolerating parenteral nutrition; patient is getting TPN at a rate of 60 mL per hour. Goal is to get her up to 75 mL per hour. #5.  Hypokalemia. Patient is receiving potassium and TPN fluid serum potassium is still low. Patient is on furosemide which is contribution to hypokalemia. #6. Right lumbar pain. This pain appears to be acute on chronic pain. She unfortunately cannot go back on NSAIDs for the time being. She is requiring pain medication primarily for this pain and not for pancreatitis.  Recommendations; Continue NPO status. KCl 10 mEq per hour IV x4. TPN rate to be escalated this evening. Oxycodone 5 mg by mouth every 4 when necessary. Change pantoprazole to order route. Weight in a.m. on sliding scale.

## 2013-07-29 NOTE — Progress Notes (Signed)
NUTRITION FOLLOW UP  Intervention:   TPN per pharmacy  Nutrition Dx:   Inadequate oral intake related to altered GI function as evidenced by NPO/clear liquids x 6 days, TPN initiation; ongoing  Goal:   Pt will meet >75% of estimated nutritional needs  Monitor:   TPN advancement and tolerance, diet advancement, labs, weight changes, I/O's  Assessment:   Pt s/p ERCP on 07/18/13 due to elevated LFT's, which revealed distal biliary stricture. Sphincterotomy was completed (brushings pending). Pt developed urinary retention after procedure. She was admitted on 07/19/13 with acute biliary pancreatitis.   Pt s/p abdomen of CT and pelvic which revealed no evidence of pseudocyst or pancreatic necrosis. Per Gastroenterologistt, plan is to continue TPN with no oral feeding and to rescan in 2 weeks. Plan is to d/c once pt achieve electrolyte balance.   Pt remains on TPN; Clinimix 5/15 has advanced to 60 ml/hr (which provides 1022 kcals and 72 grams protein). Regimen also includes daily MVI and minerals, and Also receiving Intralipid 20% at 10 ml/hr (which provides 480 kcals). Total TPN regimen at current rate meet 84% of estimated kcal needs and 61% of estimated protein needs.   Noted Mg and Phos WNL. K continues to be low, despite runs of KCL. Per pharmacy, will remain at current rate of 60 ml/hr until K is corrected. IVF rate has been decreased to 50 ml/hr.   Goal rate for TPN will be 75-80 ml/hr, which will meet 98-103% of estimated calorie needs and 76-81% of estimated protein needs, with the inclusion of daily lipids.    Height: Ht Readings from Last 1 Encounters:  07/19/13 5\' 4"  (1.626 m)    Weight Status:   Wt Readings from Last 1 Encounters:  07/19/13 174 lb 14.4 oz (79.334 kg)    Re-estimated needs:  Kcal: [id number]  Protein: 119-139 grams  Fluid: 1.9-2.1 L  Skin: rash on medial back  Diet Order: NPO   Intake/Output Summary (Last 24 hours) at 07/29/13 0903 Last data filed at  07/29/13 0433  Gross per 24 hour  Intake   1070 ml  Output   2900 ml  Net  -1830 ml    Last BM: 07/28/13   Labs:   Recent Labs Lab 07/25/13 0508 07/26/13 0614 07/27/13 0759 07/28/13 0558 07/29/13 0628  NA 138 137 137 141 142  K 3.2* 3.0* 2.9* 3.1* 3.1*  CL 98 95* 94* 96 98  CO2 29 31 32 32 32  BUN 6 5* 8 12 12   CREATININE 0.62 0.65 0.58 0.63 0.64  CALCIUM 8.3* 8.3* 8.4 8.6 8.5  MG 1.7 1.8  --  1.9  --   PHOS 1.8* 3.5 3.2 4.1  --   GLUCOSE 146* 129* 141* 131* 123*    CBG (last 3)   Recent Labs  07/29/13 0024 07/29/13 0428 07/29/13 0730  GLUCAP 126* 125* 129*    Scheduled Meds: . amLODipine  5 mg Oral Daily  . ARIPiprazole  10 mg Oral QHS  . calcium carbonate  1 tablet Oral BID  . enoxaparin (LOVENOX) injection  40 mg Subcutaneous Q24H  . furosemide  40 mg Oral Daily  . hydrocortisone cream   Topical BID  . insulin aspart  0-9 Units Subcutaneous 6 times per day  . lamoTRIgine  200 mg Oral Daily  . lidocaine  1 patch Transdermal Q24H  . losartan  100 mg Oral Daily  . metoprolol tartrate  12.5 mg Oral BID  . pantoprazole (PROTONIX) IV  40 mg  Intravenous Q12H  . potassium chloride  10 mEq Intravenous Q1 Hr x 4  . sodium chloride  10-40 mL Intracatheter Q12H  . vitamin B-12  1,000 mcg Oral Daily  . zolpidem  5 mg Oral QHS    Continuous Infusions: . sodium chloride 50 mL/hr at 07/27/13 1836  . sodium chloride Stopped (07/27/13 1837)  . Marland Kitchen.TPN (CLINIMIX-E) Adult 60 mL/hr at 07/28/13 1742   And  . fat emulsion 250 mL (07/28/13 1743)    Lindsay Palmer, RD, LDN Pager: [phone]

## 2013-07-29 NOTE — Progress Notes (Signed)
Patient seen, independently examined and chart reviewed. I agree with exam, assessment and plan discussed with Toya SmothersKaren Black, NP.  Subjective: Overall she is feeling better. She does have some low back pain and mild epigastric pain. No nausea. Rash seems to be improving symptomatically.  Objective:  Gen. Appears calm and comfortable. Sitting in chair.  Psych. Alert. Speech clear and fluent.  Cardiovascular. Regular rate and rhythm. No murmur, rub or gallop. 2+ bilateral lower extremity edema stable.  Respiratory. Clear to auscultation bilaterally. No wheezes, rales or rhonchi. Normal respiratory effort.  Abdomen. Soft. Mild epigastric pain.  Skin. Rash on back slowly improving.   I/O: Excellent urine output. Multiple bowel movements.  Chemistry: Capillary blood sugars stable. Potassium 3.1. Basic metabolic panel otherwise unremarkable.  From a clinical and subjective standpoint she continues to improve daily. Discussed with Dr. Karilyn Cotaehman, the plan is to increase today and hopefully be able to discharge home in 48 hours. Disposition deferred to gastroenterology.  Brendia Sacksaniel Goodrich, MD [facility] [phone]

## 2013-07-29 NOTE — Progress Notes (Signed)
CONSULT NOTE  Pharmacy Consult for TPN Indication: Malnutrition    Allergies  Allergen Reactions  . Codeine Nausea And Vomiting    Patient Measurements: Height: 5\' 4"  (162.6 cm) Weight: 174 lb 14.4 oz (79.334 kg) IBW/kg (Calculated) : 54.7 Adjusted Body Weight: 62 kg  Vital Signs: Temp: 98.7 F (37.1 C) (06/30 0830) Temp src: Oral (06/30 0632) BP: 135/42 mmHg (06/30 0830) Pulse Rate: 74 (06/30 0830) Intake/Output from previous day: 06/29 0701 - 06/30 0700 In: 1070 [P.O.:180; I.V.:400; TPN:490] Out: 2900 [Urine:2900] Intake/Output from this shift:    Labs:  Recent Labs  07/28/13 0558 07/28/13 0757  WBC 12.9* 9.6  HGB 5.9* 9.7*  HCT 17.9* 29.3*  PLT 361 258     Recent Labs  07/27/13 0759 07/28/13 0558 07/29/13 0628  NA 137 141 142  K 2.9* 3.1* 3.1*  CL 94* 96 98  CO2 32 32 32  GLUCOSE 141* 131* 123*  BUN 8 12 12   CREATININE 0.58 0.63 0.64  CALCIUM 8.4 8.6 8.5  MG  --  1.9  --   PHOS 3.2 4.1  --   PROT  --  6.3  --   ALBUMIN  --  2.2*  --   AST  --  26  --   ALT  --  18  --   ALKPHOS  --  322*  --   BILITOT  --  0.3  --   PREALBUMIN  --  8.0*  --   TRIG  --  113  --    Estimated Creatinine Clearance: 64.7 ml/min (by C-G formula based on Cr of 0.64).    Recent Labs  07/29/13 0428 07/29/13 0730 07/29/13 1110  GLUCAP 125* 129* 102*    Medical History: Past Medical History  Diagnosis Date  . GERD (gastroesophageal reflux disease)   . Bipolar 2 disorder   . Major depressive disorder   . Chronic insomnia   . Non-toxic multinodular goiter   . Osteopenia   . Vitamin D deficiency   . Migraine   . Elevated LFTs   . Diverticulosis of colon   . H/O first degree atrioventricular block   . Fatty liver disease, nonalcoholic   . Hypertension   . Hypercholesteremia   . Sleep apnea     Stop Bang score of 4. Pt said she had sleep apnea at one time, but she has had another sleep study since then and they said she does not have it anymore.     Medications:  Scheduled:  . amLODipine  5 mg Oral Daily  . ARIPiprazole  10 mg Oral QHS  . calcium carbonate  1 tablet Oral BID  . enoxaparin (LOVENOX) injection  40 mg Subcutaneous Q24H  . furosemide  40 mg Oral Daily  . hydrocortisone cream   Topical BID  . insulin aspart  0-9 Units Subcutaneous 6 times per day  . lamoTRIgine  200 mg Oral Daily  . lidocaine  1 patch Transdermal Q24H  . losartan  100 mg Oral Daily  . metoprolol tartrate  12.5 mg Oral BID  . potassium chloride  10 mEq Intravenous Q1 Hr x 4  . sodium chloride  10-40 mL Intracatheter Q12H  . vitamin B-12  1,000 mcg Oral Daily  . zolpidem  5 mg Oral QHS   Anti-infectives   Start     Dose/Rate Route Frequency Ordered Stop   07/20/13 2200  imipenem-cilastatin (PRIMAXIN) 500 mg in sodium chloride 0.9 % 100 mL IVPB  Status:  Discontinued     500 mg 200 mL/hr over 30 Minutes Intravenous 3 times per day 07/20/13 1858 07/25/13 1421      Insulin Requirements in the past 24 hours:  none  Current Nutrition:  NPO  Assessment: 73 yo F who has pancreatitis s/p ERCP.  She has been NPO since procedure on 6/19.  LFTs elevated, but stable. TGs 113.  Prealbumin 7.1. She has no hx of DM and glucose has been <150.   Potassium low despite increase in rate and 4 runs of KCl 10meq.  Calcium, Phos, Magnesium are at goal range.  Renal function is at patient's baseline. I/O -374.63ml. Low Hg noted.   Micro (-). Primaxin stopped 6/26.  Nutritional Goals:  Kcal: [id number]  Protein: 119-139 grams  Fluid: 1.9-2.1 L  Plan:   Continue Clinimix E 5/15 at 75 ml/hr until potassium corrected (goal rate).   Add MVI and minerals to TPN daily  Intralipid 20% at 6610ml/hr.  Watch TGs.  Continue IVF rate at 35 ml/hr.    CBGs q4h with sensitive sliding scale if needed  KCL 10meq RUN IV x 4 already ordered.  F/U labs  Mady GemmaHayes, Michael R 07/29/2013,12:29 PM

## 2013-07-29 NOTE — Progress Notes (Signed)
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 She underwent ERCP 6/19 with postprocedural complications of urinary retention. 6/20 she presented with abdominal pain and was admitted for post-ERCP pancreatitis.   Assessment/Plan: 1. Acute pancreatitis, secondary to ERCP. Stable. Continues to improve clinically. Epigastric discomfort improving. Repeat CT yields some improvement with no evidence of pseudocyst formation or pancreatic necrosis. Anticipate resuming home meds per Dr Karilyn Cotaehman. Continue  TPN. Defer management to GI 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 Status post sphincterotomy 6/19. 3. Anemia. Normocytic. Stable. Suspect anemia of acute illness. 4. Hypokalemia. Replete. Recheck in am. Magnesium low end of normal. Consider oral supplement once po meds resumed.  5. Small volume hematochezia. Resolved. Likely related to sphincterotomy. 6. Rash. Likely related to heat/prolonged contact with sheet.  7. Urinary retention resolved.  8. Bilateral pleural effusions and generalized edema. Some improvement per CT on 07/28/13.  Likely related to volume resuscitation for pancreatitis in the setting of hypoalbuminemia. Urine output 2900 cc. continue Lasix at increased dose. 9. GERD. 10. History of bipolar disorder, major depressive disorder, chronic insomnia    Code Status: full Family Communication: none present Disposition Plan: home hopefully 24-48 hours   Consultants:  gastroenterology  Procedures: ERCP Impression:  Normal pancreatogram.  Dilated CBD and CHD with short benign-appearing distal stricture.  Stricture was brushed for cytology.  10 French 5 cm long plastic stent placed across the stricture   Antibiotics: Primaxin 6/21 >>  6/26   HPI/Subjective: Sitting up in bed smiling. Reports feeling "much better"  Objective: Filed Vitals:   07/29/13 0830  BP: 135/42  Pulse: 74  Temp: 98.7 F (37.1 C)  Resp: 20    Intake/Output Summary (Last 24 hours) at 07/29/13 0918 Last data filed at 07/29/13 0433  Gross per 24 hour  Intake   1070 ml  Output   2900 ml  Net  -1830 ml   Filed Weights   07/19/13 1457  Weight: 79.334 kg (174 lb 14.4 oz)    Exam:   General:  Appears comfortable  Cardiovascular: RRR No m/g/r trace LE edema  Respiratory: normal effort BS clear bilaterally no rhonchi no wheeze  Abdomen: round slightly firm with mild tenderness to palpation. +BS throughout.   Musculoskeletal: joints without swelling/ erythema.  Data Reviewed: Basic Metabolic Panel:  Recent Labs Lab 07/24/13 1830 07/25/13 0508 07/26/13 [id number] 07/27/13 0759 07/28/13 0558 07/29/13 0628  NA 137 138 137 137 141 142  K 3.1* 3.2* 3.0* 2.9* 3.1* 3.1*  CL 95* 98 95* 94* 96 98  CO2 28 29 31  32 32 32  GLUCOSE 110* 146* 129* 141* 131* 123*  BUN 6 6 5* 8 12 12   CREATININE 0.56 0.62 0.65 0.58 0.63 0.64  CALCIUM 8.4 8.3* 8.3* 8.4 8.6 8.5  MG  --  1.7 1.8  --  1.9  --   PHOS  --  1.8* 3.5 3.2 4.1  --    Liver Function Tests:  Recent Labs Lab 07/24/13 0515 07/24/13 1830 07/26/13 0614 07/28/13 0558  AST 101* 60* 27 26  ALT 49* 40* 25 18  ALKPHOS 357* 335* 292* 322*  BILITOT 0.5 0.5 0.3 0.3  PROT 5.9* 6.1 5.9* 6.3  ALBUMIN 2.2* 2.2* 2.1* 2.2*    Recent Labs Lab 07/23/13 0453 07/24/13 0515  LIPASE 17  --  AMYLASE 44 40   No results found for this basename: AMMONIA,  in the last 168 hours CBC:  Recent Labs Lab 07/24/13 0515 07/25/13 0508 07/26/13 0614 07/28/13 0558 07/28/13 0757  WBC 8.6 10.0 10.6* 12.9* 9.6  NEUTROABS  --   --  7.9* 9.1*  --   HGB 10.3* 9.9* 10.4* 5.9* 9.7*  HCT 30.7* 29.7* 31.3* 17.9* 29.3*  MCV 91.9 92.0 92.1 92.3 91.8  PLT 211 237 247 361 258   Cardiac Enzymes: No  results found for this basename: CKTOTAL, CKMB, CKMBINDEX, TROPONINI,  in the last 168 hours BNP (last 3 results) No results found for this basename: PROBNP,  in the last 8760 hours CBG:  Recent Labs Lab 07/28/13 1652 07/28/13 2059 07/29/13 0024 07/29/13 0428 07/29/13 0730  GLUCAP 128* 114* 126* 125* 129*    Recent Results (from the past 240 hour(s))  CULTURE, BLOOD (ROUTINE X 2)     Status: None   Collection Time    07/20/13  6:01 PM      Result Value Ref Range Status   Specimen Description BLOOD RIGHT HAND   Final   Special Requests BOTTLES DRAWN AEROBIC ONLY 6CC   Final   Culture NO GROWTH 5 DAYS   Final   Report Status 07/25/2013 FINAL   Final  CULTURE, BLOOD (ROUTINE X 2)     Status: None   Collection Time    07/20/13  6:01 PM      Result Value Ref Range Status   Specimen Description BLOOD LEFT HAND   Final   Special Requests BOTTLES DRAWN AEROBIC ONLY 6CC   Final   Culture NO GROWTH 5 DAYS   Final   Report Status 07/25/2013 FINAL   Final     Studies: Ct Abdomen Pelvis W Contrast  07/28/2013   CLINICAL DATA:  Post ERCP pancreatitis  EXAM: CT ABDOMEN AND PELVIS WITH CONTRAST  TECHNIQUE: Multidetector CT imaging of the abdomen and pelvis was performed using the standard protocol following bolus administration of intravenous contrast.  CONTRAST:  50mL OMNIPAQUE IOHEXOL 300 MG/ML SOLN, 100mL OMNIPAQUE IOHEXOL 300 MG/ML SOLN  COMPARISON:  07/21/2013.  FINDINGS: Lung Bases: Slight improvement in the bibasilar compressive atelectasis seen previously. Left pleural effusion appears minimally larger in the right pleural effusion appears decreased in the interval.  Liver:  No focal intrahepatic abnormality.  Pneumobilia again noted.  Spleen: Normal  Stomach: Nondistended with normal CT imaging features.  Pancreas: The pancreatic parenchyma enhances throughout. No 9 enhancement to suggest over pancreatic necrosis. The edema/ inflammation anterior to the pancreas has progressed in the  interval. The edema/ inflammation tracks anteriorly through the transverse mesocolon towards the gastrocolic ligament. These changes have also progressed in the interval. Associated wall thickening in the transverse colon is most likely secondary to this edema/inflammation.  Gallbladder/Biliary Tree: Gallbladder is surgically absent. Common bile duct stent is seen with the distal and in the lumen of the duodenum, stable in position since the prior study.  Kidneys/Adrenals: No adrenal nodule or mass. Areas of focal scarring are seen in the right kidney. There is no enhancing mass seen in either kidney. No hydronephrosis.  Bowel Loops: No evidence for small bowel obstruction. The terminal ileum and the appendix are normal. Contrast material migrates through to the level of the rectum. There is diverticular change in the left colon without diverticulitis.  Nodes: No lymphadenopathy in the abdomen or pelvis.  Vasculature: Atherosclerotic calcification is noted in the wall of the abdominal aorta without  aneurysm.  Pelvic Genitourinary: Gas in the urinary bladder may be secondary to recent instrumentation. Uterus is unremarkable. There is no adnexal mass.  Bones/Musculoskeletal: Bone windows reveal no worrisome lytic or sclerotic osseous lesions.  Body Wall: No abdominal wall hernia. There is subcutaneous edema in the lower abdominal and pelvic body wall.  Other: Trace intraperitoneal free fluid is identified in the pelvis.  IMPRESSION: Interval progression of the edema/inflammation seen anterior to the pancreas with progression of edema/inflammation tracking down the transverse mesocolon to the gastrocolic ligament. There is no evidence for an organized pseudocyst or abscess at this time. No drainable fluid collection. No CT features to suggest pancreatic necrosis.  Common bile duct stent is stable in position. No evidence for biliary dilatation.   Electronically Signed   By: Kennith Center M.D.   On: 07/28/2013 16:45     Scheduled Meds: . amLODipine  5 mg Oral Daily  . ARIPiprazole  10 mg Oral QHS  . calcium carbonate  1 tablet Oral BID  . enoxaparin (LOVENOX) injection  40 mg Subcutaneous Q24H  . furosemide  40 mg Oral Daily  . hydrocortisone cream   Topical BID  . insulin aspart  0-9 Units Subcutaneous 6 times per day  . lamoTRIgine  200 mg Oral Daily  . lidocaine  1 patch Transdermal Q24H  . losartan  100 mg Oral Daily  . metoprolol tartrate  12.5 mg Oral BID  . pantoprazole (PROTONIX) IV  40 mg Intravenous Q12H  . potassium chloride  10 mEq Intravenous Q1 Hr x 4  . sodium chloride  10-40 mL Intracatheter Q12H  . vitamin B-12  1,000 mcg Oral Daily  . zolpidem  5 mg Oral QHS   Continuous Infusions: . sodium chloride 50 mL/hr at 07/27/13 1836  . sodium chloride Stopped (07/27/13 1837)  . Marland KitchenTPN (CLINIMIX-E) Adult 60 mL/hr at 07/28/13 1742   And  . fat emulsion 250 mL (07/28/13 1743)    Principal Problem:   Acute pancreatitis Active Problems:   Drug-induced pancreatitis DUE TO CONTRAST   Benign essential HTN   Urinary retention   Anemia    Time spent: 35 minutes    [facility] M  [facility] Pager [phone]. If 7PM-7AM, please contact night-coverage at [email or web address], password [facility] 07/29/2013, 9:18 AM  LOS: 10 days

## 2013-07-30 DIAGNOSIS — T50904A Poisoning by unspecified drugs, medicaments and biological substances, undetermined, initial encounter: Secondary | ICD-10-CM

## 2013-07-30 DIAGNOSIS — R109 Unspecified abdominal pain: Secondary | ICD-10-CM

## 2013-07-30 DIAGNOSIS — K859 Acute pancreatitis without necrosis or infection, unspecified: Secondary | ICD-10-CM

## 2013-07-30 LAB — BASIC METABOLIC PANEL
BUN: 13 mg/dL (ref 6–23)
CO2: 29 meq/L (ref 19–32)
Calcium: 8.9 mg/dL (ref 8.4–10.5)
Chloride: 97 mEq/L (ref 96–112)
Creatinine, Ser: 0.65 mg/dL (ref 0.50–1.10)
GFR calc Af Amer: >90 mL/min (ref >90)
GFR calc non Af Amer: 87 mL/min — ABNORMAL LOW (ref >90)
Glucose, Bld: 123 mg/dL — ABNORMAL HIGH (ref 70–99)
Potassium: 3.5 mEq/L — ABNORMAL LOW (ref 3.7–5.3)
Sodium: 141 mEq/L (ref 137–147)

## 2013-07-30 LAB — GLUCOSE, CAPILLARY
GLUCOSE-CAPILLARY: 155 mg/dL — AB (ref 70–99)
GLUCOSE-CAPILLARY: 94 mg/dL (ref 70–99)
Glucose-Capillary: 153 mg/dL — ABNORMAL HIGH (ref 70–99)
Glucose-Capillary: 133 mg/dL — ABNORMAL HIGH (ref 70–99)
Glucose-Capillary: 115 mg/dL — ABNORMAL HIGH (ref 70–99)

## 2013-07-30 MED ORDER — FAT EMULSION 20 % IV EMUL
250.0000 mL | INTRAVENOUS | Status: DC
  Administered 2013-07-30: 250 mL via INTRAVENOUS
  Filled 2013-07-30: qty 250

## 2013-07-30 MED ORDER — TRACE MINERALS CR-CU-F-FE-I-MN-MO-SE-ZN IV SOLN
INTRAVENOUS | Status: DC
  Administered 2013-07-30: 18:00:00 via INTRAVENOUS
  Filled 2013-07-30: qty 2000

## 2013-07-30 NOTE — Progress Notes (Signed)
Subjective; Patient has no complaints. She says her back pain is much better. She has not required pain or nausea medication for 24 hours. She is hungry. She denies abdominal pain.  Objective; BP 159/60  Pulse 68  Temp(Src) 98 F (36.7 C) (Oral)  Resp 16  Ht 5\' 4"  (1.626 m)  Wt 173 lb 3.2 oz (78.563 kg)  BMI 29.72 kg/m2  SpO2 93% Above mentioned weight is on standing scale. Patient is alert and in no acute distress.  Cardiac exam with regular rhythm normal S1 and S2. No murmur noted. Lungs are clear to auscultation. Abdomen is is less distended than yesterday. Bowel sounds are normal. She remains with fullness and mild tenderness in midepigastric region. Trace edema noted around the ankles.  Urine output 2250 mL last 24-hours.  Lab data;  Serum sodium 141, potassium 3.5, chloride 97, CO2 29, BUN 13, creatinine 0.65 Glucose 123. Serum calcium 8.9.    Assessment;  #1.Post ERCP pancreatitis. Patient continues to show signs and symptoms of improvement. Abdominal distention has decreased significantly over the last 3 days. She is less tender and fullness is not as pronounced. However she is not ready to go on oral feeding. #2. Benign distal CBD stricture. Status post ERCP with sphincterotomy and stenting on 07/18/2013. Brush cytology from the stricture revealed reactive/reparative changes. #4. Malnutrition. Patient is tolerating parenteral nutrition; she is now up to decide rate to meet her needs.. #5. Hypokalemia. Serum potassium now is low normal at 3.5. #6. Right lumbar pain. Good pain control with Lidoderm patch and she has not required pain medication in over 24 hours.  Recommendations; Consider Lasix on when necessary basis. Agree with plans for discharge in a.m. Will check LFTs with a.m. lab.

## 2013-07-30 NOTE — Progress Notes (Signed)
I have directly reviewed the clinical findings, lab, imaging studies and management of this patient in detail. I have interviewed and examined the patient and agree with the documentation,  as recorded by the Physician extender, Ms. Toya SmothersKaren Black, NP.  73 year old female with acute pancreatitis secondary to ERCP. Gastroenterology consulted and currently managing. Patient currently continues to need TPN will likely be discharged on 07/31/2013 with TPN. This is being arranged by case management. Patient does states she's feeling better today and is anxious to go home.  Edsel PetrinMIKHAIL, Eleesha Purkey D.O. on 07/30/2013 at 3:16 PM  [facility] Office  [phone]

## 2013-07-30 NOTE — Progress Notes (Signed)
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 She underwent ERCP 6/19 with postprocedural complications of urinary retention. 6/20 she presented with abdominal pain and was admitted for post-ERCP pancreatitis.  Assessment/Plan: 1. Acute pancreatitis, secondary to ERCP. Continues to improve clinically. Epigastric discomfort improving. Repeat CT yields some improvement with no evidence of pseudocyst formation or pancreatic necrosis. Tolerating home meds. Continue TPN. Defer management to GI 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 Status post sphincterotomy 6/19. 3. Anemia. Normocytic. Stable. Suspect anemia of acute illness. 4. Hypokalemia. Replete. Recheck in am. Magnesium low end of normal. Consider oral supplement once po meds resumed.  5. Small volume hematochezia. Resolved. Likely related to sphincterotomy. 6. Rash. Likely related to heat/prolonged contact with sheet.  7. Urinary retention resolved.  8. Bilateral pleural effusions and generalized edema. Some improvement per CT on 07/28/13. Likely related to volume resuscitation for pancreatitis in the setting of hypoalbuminemia. Urine output 2200 cc. continue Lasix at increased dose. 9. GERD. 10. History of bipolar disorder, major depressive disorder, chronic insomnia   Code Status: full Family Communication: none present Disposition Plan: home hopefully tomorrow with HH and TPN   Consultants:  GI  Procedures: ERCP Impression:  Normal pancreatogram.  Dilated CBD and CHD with short benign-appearing distal stricture.  Stricture was brushed for cytology.  10 French 5 cm long plastic stent placed across the stricture   Antibiotics: Primaxin 6/21 >> 6/26    HPI/Subjective: Awake  alert . Reports feeling ok.   Objective: Filed Vitals:   07/30/13 0500  BP: 159/60  Pulse: 68  Temp: 98 F (36.7 C)  Resp: 16    Intake/Output Summary (Last 24 hours) at 07/30/13 1223 Last data filed at 07/29/13 2110  Gross per 24 hour  Intake   1480 ml  Output   2250 ml  Net   -770 ml   Filed Weights   07/19/13 1457 07/30/13 0806 07/30/13 0807  Weight: 79.334 kg (174 lb 14.4 oz) 77.61 kg (171 lb 1.6 oz) 78.563 kg (173 lb 3.2 oz)    Exam:   General:  Appears calm and comfortable  Cardiovascular: RRR No MGR trace LE edema  Respiratory: normal effort BS clear no wheeze no rhonci  Abdomen: round soft +BS only mild tenderness to palpation  Musculoskeletal: no clubbing or cyanosis   Data Reviewed: Basic Metabolic Panel:  Recent Labs Lab 07/24/13 1830 07/25/13 0508 07/26/13 [id number] 07/27/13 0759 07/28/13 0558 07/29/13 0628 07/30/13 0519  NA 137 138 137 137 141 142 141  K 3.1* 3.2* 3.0* 2.9* 3.1* 3.1* 3.5*  CL 95* 98 95* 94* 96 98 97  CO2 28 29 31  32 32 32 29  GLUCOSE 110* 146* 129* 141* 131* 123* 123*  BUN 6 6 5* 8 12 12 13   CREATININE 0.56 0.62 0.65 0.58 0.63 0.64 0.65  CALCIUM 8.4 8.3* 8.3* 8.4 8.6 8.5 8.9  MG  --  1.7 1.8  --  1.9  --   --   PHOS  --  1.8* 3.5 3.2 4.1  --   --    Liver Function Tests:  Recent Labs Lab 07/24/13 0515 07/24/13 1830 07/26/13 0614 07/28/13 0558  AST 101* 60* 27 26  ALT 49* 40* 25 18  ALKPHOS 357* 335* 292* 322*  BILITOT 0.5 0.5 0.3 0.3  PROT 5.9* 6.1 5.9* 6.3  ALBUMIN 2.2* 2.2* 2.1* 2.2*    Recent Labs Lab 07/24/13 0515  AMYLASE 40   No results found for this basename: AMMONIA,  in the last 168 hours CBC:  Recent Labs Lab 07/24/13 0515 07/25/13 0508 07/26/13 0614 07/28/13 0558 07/28/13 0757  WBC 8.6 10.0 10.6* 12.9* 9.6  NEUTROABS  --   --  7.9* 9.1*  --   HGB 10.3* 9.9* 10.4* 5.9* 9.7*  HCT 30.7* 29.7* 31.3* 17.9* 29.3*  MCV 91.9 92.0 92.1 92.3 91.8  PLT 211 237 247 361 258   Cardiac Enzymes: No  results found for this basename: CKTOTAL, CKMB, CKMBINDEX, TROPONINI,  in the last 168 hours BNP (last 3 results) No results found for this basename: PROBNP,  in the last 8760 hours CBG:  Recent Labs Lab 07/29/13 1946 07/29/13 2337 07/30/13 0401 07/30/13 0719 07/30/13 1142  GLUCAP 111* 136* 153* 133* 155*    Recent Results (from the past 240 hour(s))  CULTURE, BLOOD (ROUTINE X 2)     Status: None   Collection Time    07/20/13  6:01 PM      Result Value Ref Range Status   Specimen Description BLOOD RIGHT HAND   Final   Special Requests BOTTLES DRAWN AEROBIC ONLY 6CC   Final   Culture NO GROWTH 5 DAYS   Final   Report Status 07/25/2013 FINAL   Final  CULTURE, BLOOD (ROUTINE X 2)     Status: None   Collection Time    07/20/13  6:01 PM      Result Value Ref Range Status   Specimen Description BLOOD LEFT HAND   Final   Special Requests BOTTLES DRAWN AEROBIC ONLY 6CC   Final   Culture NO GROWTH 5 DAYS   Final   Report Status 07/25/2013 FINAL   Final     Studies: Ct Abdomen Pelvis W Contrast  07/28/2013   CLINICAL DATA:  Post ERCP pancreatitis  EXAM: CT ABDOMEN AND PELVIS WITH CONTRAST  TECHNIQUE: Multidetector CT imaging of the abdomen and pelvis was performed using the standard protocol following bolus administration of intravenous contrast.  CONTRAST:  50mL OMNIPAQUE IOHEXOL 300 MG/ML SOLN, 100mL OMNIPAQUE IOHEXOL 300 MG/ML SOLN  COMPARISON:  07/21/2013.  FINDINGS: Lung Bases: Slight improvement in the bibasilar compressive atelectasis seen previously. Left pleural effusion appears minimally larger in the right pleural effusion appears decreased in the interval.  Liver:  No focal intrahepatic abnormality.  Pneumobilia again noted.  Spleen: Normal  Stomach: Nondistended with normal CT imaging features.  Pancreas: The pancreatic parenchyma enhances throughout. No 9 enhancement to suggest over pancreatic necrosis. The edema/ inflammation anterior to the pancreas has progressed in the  interval. The edema/ inflammation tracks anteriorly through the transverse mesocolon towards the gastrocolic ligament. These changes have also progressed in the interval. Associated wall thickening in the transverse colon is most likely secondary to this edema/inflammation.  Gallbladder/Biliary Tree: Gallbladder is surgically absent. Common bile duct stent is seen with the distal and in the lumen of the duodenum, stable in position since the prior study.  Kidneys/Adrenals: No adrenal nodule or mass. Areas of focal scarring are seen in the right kidney. There is no enhancing mass seen in either kidney. No hydronephrosis.  Bowel Loops: No evidence for small bowel obstruction. The terminal ileum and the appendix are normal. Contrast material migrates through to the level of the rectum. There is diverticular change in the left colon without diverticulitis.  Nodes: No lymphadenopathy  in the abdomen or pelvis.  Vasculature: Atherosclerotic calcification is noted in the wall of the abdominal aorta without aneurysm.  Pelvic Genitourinary: Gas in the urinary bladder may be secondary to recent instrumentation. Uterus is unremarkable. There is no adnexal mass.  Bones/Musculoskeletal: Bone windows reveal no worrisome lytic or sclerotic osseous lesions.  Body Wall: No abdominal wall hernia. There is subcutaneous edema in the lower abdominal and pelvic body wall.  Other: Trace intraperitoneal free fluid is identified in the pelvis.  IMPRESSION: Interval progression of the edema/inflammation seen anterior to the pancreas with progression of edema/inflammation tracking down the transverse mesocolon to the gastrocolic ligament. There is no evidence for an organized pseudocyst or abscess at this time. No drainable fluid collection. No CT features to suggest pancreatic necrosis.  Common bile duct stent is stable in position. No evidence for biliary dilatation.   Electronically Signed   By: Kennith Center M.D.   On: 07/28/2013 16:45     Scheduled Meds: . amLODipine  5 mg Oral Daily  . ARIPiprazole  10 mg Oral QHS  . calcium carbonate  1 tablet Oral BID  . enoxaparin (LOVENOX) injection  40 mg Subcutaneous Q24H  . furosemide  40 mg Oral Daily  . hydrocortisone cream   Topical BID  . insulin aspart  0-9 Units Subcutaneous 6 times per day  . lamoTRIgine  200 mg Oral Daily  . lidocaine  1 patch Transdermal Q24H  . losartan  100 mg Oral Daily  . metoprolol tartrate  12.5 mg Oral BID  . sodium chloride  10-40 mL Intracatheter Q12H  . vitamin B-12  1,000 mcg Oral Daily  . zolpidem  5 mg Oral QHS   Continuous Infusions: . sodium chloride 35 mL/hr at 07/29/13 2303  . Marland KitchenTPN (CLINIMIX-E) Adult 75 mL/hr at 07/29/13 1735   And  . fat emulsion 250 mL (07/29/13 1736)  . Marland KitchenTPN (CLINIMIX-E) Adult     And  . fat emulsion      Principal Problem:   Acute pancreatitis Active Problems:   Drug-induced pancreatitis DUE TO CONTRAST   Benign essential HTN   Urinary retention   Anemia    Time spent: 35 minutes    [facility] M  [facility] Pager [phone]. If 7PM-7AM, please contact night-coverage at [email or web address], password [facility] 07/30/2013, 12:23 PM  LOS: 11 days

## 2013-07-30 NOTE — Progress Notes (Signed)
CONSULT NOTE  Pharmacy Consult for TPN Indication: Malnutrition  Allergies  Allergen Reactions  . Codeine Nausea And Vomiting   Patient Measurements: Height: 5\' 4"  (162.6 cm) Weight: 173 lb 3.2 oz (78.563 kg) IBW/kg (Calculated) : 54.7 Adjusted Body Weight: 62 kg  Vital Signs: Temp: 98 F (36.7 C) (07/01 0500) Temp src: Oral (07/01 0500) BP: 159/60 mmHg (07/01 0500) Pulse Rate: 68 (07/01 0500) Intake/Output from previous day: 06/30 0701 - 07/01 0700 In: 1480 [P.O.:120; I.V.:380; IV Piggyback:400; TPN:580] Out: 2250 [Urine:2250] Intake/Output from this shift:   Labs:  Recent Labs  07/28/13 0558 07/28/13 0757  WBC 12.9* 9.6  HGB 5.9* 9.7*  HCT 17.9* 29.3*  PLT 361 258    Recent Labs  07/28/13 0558 07/29/13 0628 07/30/13 0519  NA 141 142 141  K 3.1* 3.1* 3.5*  CL 96 98 97  CO2 32 32 29  GLUCOSE 131* 123* 123*  BUN 12 12 13   CREATININE 0.63 0.64 0.65  CALCIUM 8.6 8.5 8.9  MG 1.9  --   --   PHOS 4.1  --   --   PROT 6.3  --   --   ALBUMIN 2.2*  --   --   AST 26  --   --   ALT 18  --   --   ALKPHOS 322*  --   --   BILITOT 0.3  --   --   PREALBUMIN 8.0*  --   --   TRIG 113  --   --    Estimated Creatinine Clearance: 64.5 ml/min (by C-G formula based on Cr of 0.65).    Recent Labs  07/29/13 2337 07/30/13 0401 07/30/13 0719  GLUCAP 136* 153* 133*   Medical History: Past Medical History  Diagnosis Date  . GERD (gastroesophageal reflux disease)   . Bipolar 2 disorder   . Major depressive disorder   . Chronic insomnia   . Non-toxic multinodular goiter   . Osteopenia   . Vitamin D deficiency   . Migraine   . Elevated LFTs   . Diverticulosis of colon   . H/O first degree atrioventricular block   . Fatty liver disease, nonalcoholic   . Hypertension   . Hypercholesteremia   . Sleep apnea     Stop Bang score of 4. Pt said she had sleep apnea at one time, but she has had another sleep study since then and they said she does not have it anymore.    Medications:  Scheduled:  . amLODipine  5 mg Oral Daily  . ARIPiprazole  10 mg Oral QHS  . calcium carbonate  1 tablet Oral BID  . enoxaparin (LOVENOX) injection  40 mg Subcutaneous Q24H  . furosemide  40 mg Oral Daily  . hydrocortisone cream   Topical BID  . insulin aspart  0-9 Units Subcutaneous 6 times per day  . lamoTRIgine  200 mg Oral Daily  . lidocaine  1 patch Transdermal Q24H  . losartan  100 mg Oral Daily  . metoprolol tartrate  12.5 mg Oral BID  . sodium chloride  10-40 mL Intracatheter Q12H  . vitamin B-12  1,000 mcg Oral Daily  . zolpidem  5 mg Oral QHS   Anti-infectives   Start     Dose/Rate Route Frequency Ordered Stop   07/20/13 2200  imipenem-cilastatin (PRIMAXIN) 500 mg in sodium chloride 0.9 % 100 mL IVPB  Status:  Discontinued     500 mg 200 mL/hr over 30 Minutes Intravenous 3 times  per day 07/20/13 1858 07/25/13 1421     Insulin Requirements in the past 24 hours:  6 units SSI  Current Nutrition:  NPO  Assessment: 73 yo F who has pancreatitis s/p ERCP.  She has been NPO since procedure on 6/19.  LFTs elevated, but stable. TGs 113.  Prealbumin 7.1. She has no hx of DM and glucose has been 102-153.   Potassium increasing s/p replacement and increase in TPN rate on 6/30.  Calcium, Phos, Magnesium are at goal range.  Renal function is at patient's baseline. I/O -374.503ml. Low Hg noted (improved).   Micro (-). Primaxin stopped 6/26.  Nutritional Goals:  Kcal: [id number]  Protein: 119-139 grams  Fluid: 1.9-2.1 L  Plan:   Clinimix E 5/15 at 80 ml/hr (goal rate).   Add MVI and minerals to TPN daily  Intralipid 20% at 1710ml/hr.  Watch TGs.  Continue IVF rate at 35 ml/hr.    CBGs q4h with sensitive sliding scale if needed  F/U labs  Mady GemmaHayes, Jaylina Ramdass R 07/30/2013,10:33 AM

## 2013-07-31 LAB — COMPREHENSIVE METABOLIC PANEL
ALT: 14 U/L (ref 0–35)
ANION GAP: 13 (ref 5–15)
AST: 20 U/L (ref 0–37)
Albumin: 2.3 g/dL — ABNORMAL LOW (ref 3.5–5.2)
Alkaline Phosphatase: 256 U/L — ABNORMAL HIGH (ref 39–117)
BUN: 15 mg/dL (ref 6–23)
CALCIUM: 9.1 mg/dL (ref 8.4–10.5)
CO2: 29 mEq/L (ref 19–32)
CREATININE: 0.66 mg/dL (ref 0.50–1.10)
Chloride: 98 mEq/L (ref 96–112)
GFR, EST NON AFRICAN AMERICAN: 86 mL/min — AB (ref >90)
GLUCOSE: 127 mg/dL — AB (ref 70–99)
Potassium: 3.4 mEq/L — ABNORMAL LOW (ref 3.7–5.3)
SODIUM: 140 meq/L (ref 137–147)
TOTAL PROTEIN: 6.7 g/dL (ref 6.0–8.3)
Total Bilirubin: 0.3 mg/dL (ref 0.3–1.2)

## 2013-07-31 LAB — MAGNESIUM: MAGNESIUM: 2.1 mg/dL (ref 1.5–2.5)

## 2013-07-31 LAB — CBC
HCT: 31.9 % — ABNORMAL LOW (ref 36.0–46.0)
Hemoglobin: 10.6 g/dL — ABNORMAL LOW (ref 12.0–15.0)
MCH: 30.5 pg (ref 26.0–34.0)
MCHC: 33.2 g/dL (ref 30.0–36.0)
MCV: 91.7 fL (ref 78.0–100.0)
PLATELETS: 308 10*3/uL (ref 150–400)
RBC: 3.48 MIL/uL — ABNORMAL LOW (ref 3.87–5.11)
RDW: 14.1 % (ref 11.5–15.5)
WBC: 9.2 10*3/uL (ref 4.0–10.5)

## 2013-07-31 LAB — GLUCOSE, CAPILLARY
GLUCOSE-CAPILLARY: 117 mg/dL — AB (ref 70–99)
Glucose-Capillary: 126 mg/dL — ABNORMAL HIGH (ref 70–99)
Glucose-Capillary: 125 mg/dL — ABNORMAL HIGH (ref 70–99)
Glucose-Capillary: 111 mg/dL — ABNORMAL HIGH (ref 70–99)

## 2013-07-31 LAB — PHOSPHORUS: PHOSPHORUS: 4.6 mg/dL (ref 2.3–4.6)

## 2013-07-31 MED ORDER — POTASSIUM CHLORIDE 10 MEQ/100ML IV SOLN
10.0000 meq | INTRAVENOUS | Status: AC
  Administered 2013-07-31 (×2): 10 meq via INTRAVENOUS
  Filled 2013-07-31: qty 100

## 2013-07-31 MED ORDER — LIDOCAINE HCL 4 % EX SOLN: CUTANEOUS | Status: DC | PRN

## 2013-07-31 MED ORDER — FUROSEMIDE 20 MG PO TABS: 20.0000 mg | ORAL_TABLET | Freq: Every day | ORAL | Status: DC | PRN

## 2013-07-31 MED ORDER — FUROSEMIDE 20 MG PO TABS
20.0000 mg | ORAL_TABLET | Freq: Every day | ORAL | Status: DC
  Administered 2013-07-31: 20 mg via ORAL
  Filled 2013-07-31: qty 1

## 2013-07-31 MED ORDER — FUROSEMIDE 40 MG PO TABS: 40.0000 mg | ORAL_TABLET | Freq: Every day | ORAL | Status: DC | PRN

## 2013-07-31 MED ORDER — HYDROCORTISONE 1 % EX CREA: TOPICAL_CREAM | Freq: Two times a day (BID) | CUTANEOUS | Status: DC

## 2013-07-31 MED ORDER — TRACE MINERALS CR-CU-F-FE-I-MN-MO-SE-ZN IV SOLN
INTRAVENOUS | Status: DC
  Filled 2013-07-31: qty 2000

## 2013-07-31 MED ORDER — FAT EMULSION 20 % IV EMUL
250.0000 mL | INTRAVENOUS | Status: DC
  Filled 2013-07-31: qty 250

## 2013-07-31 MED ORDER — OXYCODONE HCL 5 MG PO TABS: 5.0000 mg | ORAL_TABLET | ORAL | Status: DC | PRN

## 2013-07-31 NOTE — Progress Notes (Signed)
CONSULT NOTE  Pharmacy Consult for TPN Indication: Malnutrition  Allergies  Allergen Reactions  . Codeine Nausea And Vomiting   Patient Measurements: Height: 5\' 4"  (162.6 cm) Weight: 173 lb 3.2 oz (78.563 kg) IBW/kg (Calculated) : 54.7 Adjusted Body Weight: 62 kg  Vital Signs: Temp: 98.2 F (36.8 C) (07/02 0500) Temp src: Oral (07/02 0500) BP: 146/69 mmHg (07/02 0500) Pulse Rate: 66 (07/02 0500) Intake/Output from previous day: 07/01 0701 - 07/02 0700 In: 3156.9 [I.V.:937.4; TPN:2219.5] Out: 1576 [Urine:1575; Stool:1] Intake/Output from this shift: Total I/O In: -  Out: 100 [Urine:100] Labs:  Recent Labs  07/31/13 0445  WBC 9.2  HGB 10.6*  HCT 31.9*  PLT 308    Recent Labs  07/29/13 0628 07/30/13 0519 07/31/13 0445  NA 142 141 140  K 3.1* 3.5* 3.4*  CL 98 97 98  CO2 32 29 29  GLUCOSE 123* 123* 127*  BUN 12 13 15   CREATININE 0.64 0.65 0.66  CALCIUM 8.5 8.9 9.1  MG  --   --  2.1  PHOS  --   --  4.6  PROT  --   --  6.7  ALBUMIN  --   --  2.3*  AST  --   --  20  ALT  --   --  14  ALKPHOS  --   --  256*  BILITOT  --   --  0.3   Estimated Creatinine Clearance: 64.5 ml/min (by C-G formula based on Cr of 0.66).    Recent Labs  07/31/13 0100 07/31/13 0350 07/31/13 0734  GLUCAP 117* 126* 125*   Medical History: Past Medical History  Diagnosis Date  . GERD (gastroesophageal reflux disease)   . Bipolar 2 disorder   . Major depressive disorder   . Chronic insomnia   . Non-toxic multinodular goiter   . Osteopenia   . Vitamin D deficiency   . Migraine   . Elevated LFTs   . Diverticulosis of colon   . H/O first degree atrioventricular block   . Fatty liver disease, nonalcoholic   . Hypertension   . Hypercholesteremia   . Sleep apnea     Stop Bang score of 4. Pt said she had sleep apnea at one time, but she has had another sleep study since then and they said she does not have it anymore.   Medications:  Scheduled:  . amLODipine  5 mg Oral  Daily  . ARIPiprazole  10 mg Oral QHS  . calcium carbonate  1 tablet Oral BID  . enoxaparin (LOVENOX) injection  40 mg Subcutaneous Q24H  . furosemide  40 mg Oral Daily  . hydrocortisone cream   Topical BID  . insulin aspart  0-9 Units Subcutaneous 6 times per day  . lamoTRIgine  200 mg Oral Daily  . lidocaine  1 patch Transdermal Q24H  . losartan  100 mg Oral Daily  . metoprolol tartrate  12.5 mg Oral BID  . sodium chloride  10-40 mL Intracatheter Q12H  . vitamin B-12  1,000 mcg Oral Daily  . zolpidem  5 mg Oral QHS   Anti-infectives   Start     Dose/Rate Route Frequency Ordered Stop   07/20/13 2200  imipenem-cilastatin (PRIMAXIN) 500 mg in sodium chloride 0.9 % 100 mL IVPB  Status:  Discontinued     500 mg 200 mL/hr over 30 Minutes Intravenous 3 times per day 07/20/13 1858 07/25/13 1421     Insulin Requirements in the past 24 hours:  6 units  SSI  Current Nutrition:  NPO  Assessment: 73 yo F who has pancreatitis s/p ERCP.  She has been NPO since procedure on 6/19.  LFTs improved, but stable. TGs 113.  Prealbumin 8.0. She has no hx of DM and glucose has been 94-125.   Potassium slightly low s/p replacement and increase in TPN rate on 6/30.  Calcium, Phos, Magnesium are at goal range.  Renal function is at patient's baseline.  Patient may be d/c'd home today on TPN.  Nutritional Goals:  Kcal: [id number]  Protein: 119-139 grams  Fluid: 1.9-2.1 L  Plan:   Clinimix E 5/15 at 80 ml/hr (goal rate).   Add MVI and minerals to TPN daily  Intralipid 20% at 2610ml/hr.  Watch TGs.  Continue IVF rate at 35 ml/hr.    CBGs q4h with sensitive sliding scale if needed  2 Runs KCL  F/U labs  Mady GemmaHayes, Tristin Gladman R 07/31/2013,9:00 AM

## 2013-07-31 NOTE — Plan of Care (Signed)
Problem: Phase III Progression Outcomes Goal: Other Phase III Outcomes/Goals Outcome: Completed/Met Date Met:  07/31/13 Home health arranged for discharge with TPN

## 2013-07-31 NOTE — Discharge Summary (Signed)
I have directly reviewed the clinical findings, lab, imaging studies and management of this patient in detail. I have interviewed and examined the patient and agree with the documentation,  as recorded by the Physician extender, Ms. Toya SmothersKaren Black, NP.  Patient will be discharged home with home health. She will be placed on TPN to be continued at home. She would follow Dr. [place] for pancreatitis as well as obtain a BMP within 2 weeks after discharge.  Edsel PetrinMIKHAIL, Ludwika Rodd D.O. on 07/31/2013 at 2:18 PM  [facility] Office  [phone]

## 2013-07-31 NOTE — Discharge Summary (Signed)
Physician Discharge Summary  Lindsay Palmer ZOX:[id number] DOB: [birth year] DOA: 07/19/2013  PCP: [name],[name], MD  Admit date: 07/19/2013 Discharge date: 07/31/2013  Time spent: 40 minutes  Recommendations for Outpatient Follow-up:  1. Dr. Karilyn Cotaehman 08/07/13. Follow pancreatitis and home TPN 2. Home health RN for home TPN 3. PCP 2 weeks for post hospital follow up. Recommend BMET to evaluate electrolytes. Lasix changed to prn.   Discharge Diagnoses:  Principal Problem:   Acute pancreatitis Active Problems:   Drug-induced pancreatitis DUE TO CONTRAST   Benign essential HTN   Urinary retention   Anemia   Discharge Condition: stable  Diet recommendation: npo except chips and sips with meds  Filed Weights   07/19/13 1457 07/30/13 0806 07/30/13 0807  Weight: 79.334 kg (174 lb 14.4 oz) 77.61 kg (171 lb 1.6 oz) 78.563 kg (173 lb 3.2 oz)    History of present illness:  Lindsay LinerHelen Porchia is a 73 y.o. female recently underwent an ERCP on 6/19 her elevated liver function tests and a biliary stent placed. After returning home patient developed epigastric abdominal pain. Her pain continued to get worse overnight. On 07/19/13 she developed nausea, and had one episode of vomiting which contained blood. She reported to the emergency room for evaluation where she was noted to have an elevated lipase consistent with pancreatitis. She denied any fever, cough, shortness of breath. She was noted to have urinary retention the day prior and had a Foley catheter place with leg bag. She reported having urinary retention in the past after having surgery.   Hospital Course:  Acute pancreatitis, secondary to ERCP. Admitted and provided with bowel rest and pain management. Repeat CT 07/28/13 yields improvement with no evidence of pseudocyst formation or pancreatic necrosis. Lipase 17 on 6/24. Tolerating po meds. [facility] Will follow up with Dr Karilyn Cotaehman 08/07/13.   Benign distal common bile duct stricture.  Status post sphincterotomy 6/19.   Anemia. Normocytic. Stable. Suspect anemia of acute illness.   Hypokalemia. Repleted. Recheck in am. Magnesium low end of normal. Consider oral supplement.   Small volume hematochezia. Resolved. Likely related to sphincterotomy.   Rash. Likely related to heat/prolonged contact with sheet.   Urinary retention resolved.   Bilateral pleural effusions and generalized edema. Some improvement per CT on 07/28/13. Likely related to volume resuscitation for pancreatitis in the setting of hypoalbuminemia. Provided with increased lasix dose for 4 days. Will discharge with home dose lasix but change to prn.   History of bipolar disorder, major depressive disorder, chronic insomnia   Procedures: ERCP Impression:  Normal pancreatogram.  Dilated CBD and CHD with short benign-appearing distal stricture.  Stricture was brushed for cytology.  10 French 5 cm long plastic stent placed across the stricture   Consultations:  GI dr Karilyn Cotarehman  Discharge Exam: Filed Vitals:   07/31/13 0500  BP: 146/69  Pulse: 66  Temp: 98.2 F (36.8 C)  Resp: 18    General: ambulating in room with steady gait. smiling Cardiovascular: S1 and S2. No MGR no LE edema Respiratory: normal effort BS clear bilaterally no wheeze Abdomen: rounded but less distended, soft +BS   Discharge Instructions You were cared for by a hospitalist during your hospital stay. If you have any questions about your discharge medications or the care you received while you were in the hospital after you are discharged, you can call the unit and asked to speak with the hospitalist on call if the hospitalist that took care of you is not available.  Once you are discharged, your primary care physician will handle any further medical issues. Please note that NO REFILLS for any discharge medications will be authorized once you are discharged, as it is imperative that you return to your primary care physician (or  establish a relationship with a primary care physician if you do not have one) for your aftercare needs so that they can reassess your need for medications and monitor your lab values.      Discharge Instructions   Diet - low sodium heart healthy    Complete by:  As directed      Discharge instructions    Complete by:  As directed   Take medications as directed.  Follow up with Dr Karilyn Cotaehman as scheduled     Increase activity slowly    Complete by:  As directed             Medication List    STOP taking these medications       potassium chloride SA 20 MEQ tablet  Commonly known as:  K-DUR,KLOR-CON      TAKE these medications       amLODipine 5 MG tablet  Commonly known as:  NORVASC  Take 5 mg by mouth daily.     ARIPiprazole 10 MG tablet  Commonly known as:  ABILIFY  Take 10 mg by mouth at bedtime.     aspirin EC 81 MG tablet  Take 81 mg by mouth daily.     calcium carbonate 500 MG chewable tablet  Commonly known as:  TUMS - dosed in mg elemental calcium  Chew 1 tablet by mouth 2 (two) times daily.     dicyclomine 10 MG capsule  Commonly known as:  BENTYL  Take 1 capsule (10 mg total) by mouth 3 (three) times daily as needed for spasms.     FISH OIL + D3 PO  Take 1,200 mg by mouth 2 (two) times daily.     furosemide 20 MG tablet  Commonly known as:  LASIX  Take 1 tablet (20 mg total) by mouth daily as needed for fluid or edema.     hydrocortisone cream 1 %  Apply topically 2 (two) times daily.     lamoTRIgine 200 MG tablet  Commonly known as:  LAMICTAL  Take 200 mg by mouth daily.     lidocaine 4 % external solution  Commonly known as:  XYLOCAINE  Apply topically as needed.     losartan 100 MG tablet  Commonly known as:  COZAAR  Take 100 mg by mouth daily.     meloxicam 15 MG tablet  Commonly known as:  MOBIC  Take 15 mg by mouth daily.     metoprolol tartrate 25 MG tablet  Commonly known as:  LOPRESSOR  Take 12.5 mg by mouth 2 (two) times daily.  Patient takes 1/2 tablet twice a day     oxyCODONE 5 MG immediate release tablet  Commonly known as:  Oxy IR/ROXICODONE  Take 1 tablet (5 mg total) by mouth every 4 (four) hours as needed for severe pain.     simvastatin 20 MG tablet  Commonly known as:  ZOCOR  Take 20 mg by mouth daily. In the afternoon     vitamin B-12 1000 MCG tablet  Commonly known as:  CYANOCOBALAMIN  Take 1,000 mcg by mouth daily.     Vitamin D 400 UNITS capsule  Take 400 Units by mouth daily.     zolpidem 10 MG tablet  Commonly known as:  AMBIEN  Take 10 mg by mouth at bedtime.       Allergies  Allergen Reactions  . Codeine Nausea And Vomiting   Follow-up Information   Follow up with DR [facility] On 08/07/2013. (appointment at 3:30 for follow up)    Contact information:   [address] [place] [address]        The results of significant diagnostics from this hospitalization (including imaging, microbiology, ancillary and laboratory) are listed below for reference.    Significant Diagnostic Studies: Ct Abdomen Pelvis W Contrast  07/28/2013   CLINICAL DATA:  Post ERCP pancreatitis  EXAM: CT ABDOMEN AND PELVIS WITH CONTRAST  TECHNIQUE: Multidetector CT imaging of the abdomen and pelvis was performed using the standard protocol following bolus administration of intravenous contrast.  CONTRAST:  50mL OMNIPAQUE IOHEXOL 300 MG/ML SOLN, OMNIPAQUE IOHEXOL 300 MG/ML SOLN  COMPARISON:  07/21/2013.  FINDINGS: Lung Bases: Slight improvement in the bibasilar compressive atelectasis seen previously. Left pleural effusion appears minimally larger in the right pleural effusion appears decreased in the interval.  Liver:  No focal intrahepatic abnormality.  Pneumobilia again noted.  Spleen: Normal  Stomach: Nondistended with normal CT imaging features.  Pancreas: The pancreatic parenchyma enhances throughout. No 9 enhancement to suggest over pancreatic necrosis. The edema/ inflammation anterior  to the pancreas has progressed in the interval. The edema/ inflammation tracks anteriorly through the transverse mesocolon towards the gastrocolic ligament. These changes have also progressed in the interval. Associated wall thickening in the transverse colon is most likely secondary to this edema/inflammation.  Gallbladder/Biliary Tree: Gallbladder is surgically absent. Common bile duct stent is seen with the distal and in the lumen of the duodenum, stable in position since the prior study.  Kidneys/Adrenals: No adrenal nodule or mass. Areas of focal scarring are seen in the right kidney. There is no enhancing mass seen in either kidney. No hydronephrosis.  Bowel Loops: No evidence for small bowel obstruction. The terminal ileum and the appendix are normal. Contrast material migrates through to the level of the rectum. There is diverticular change in the left colon without diverticulitis.  Nodes: No lymphadenopathy in the abdomen or pelvis.  Vasculature: Atherosclerotic calcification is noted in the wall of the abdominal aorta without aneurysm.  Pelvic Genitourinary: Gas in the urinary bladder may be secondary to recent instrumentation. Uterus is unremarkable. There is no adnexal mass.  Bones/Musculoskeletal: Bone windows reveal no worrisome lytic or sclerotic osseous lesions.  Body Wall: No abdominal wall hernia. There is subcutaneous edema in the lower abdominal and pelvic body wall.  Other: Trace intraperitoneal free fluid is identified in the pelvis.  IMPRESSION: Interval progression of the edema/inflammation seen anterior to the pancreas with progression of edema/inflammation tracking down the transverse mesocolon to the gastrocolic ligament. There is no evidence for an organized pseudocyst or abscess at this time. No drainable fluid collection. No CT features to suggest pancreatic necrosis.  Common bile duct stent is stable in position. No evidence for biliary dilatation.   Electronically Signed   By: Kennith Center M.D.   On: 07/28/2013 16:45   Ct Abdomen Pelvis W Contrast  07/21/2013   CLINICAL DATA:  Post ERCP pancreatitis.  EXAM: CT ABDOMEN AND PELVIS WITH CONTRAST  TECHNIQUE: Multidetector CT imaging of the abdomen and pelvis was performed using the standard protocol following bolus administration of intravenous contrast.  CONTRAST:  OMNIPAQUE IOHEXOL 300 MG/ML  SOLN  COMPARISON:  ERCP 07/18/2013  FINDINGS: The lung bases demonstrate bilateral pleural effusions and bibasilar atelectasis. The heart is normal in size. No pericardial effusion.  The liver demonstrates pneumobilia associated with recent ERCP, sphincterotomy and stent placement. No worrisome enhancement or abscess. The gallbladder surgically absent. The plastic common bile duct stent appears well positioned. No complicating features are demonstrated. There are changes of acute pancreatitis with peripancreatic inflammatory phlegmon and inflammation mainly in the head of the pancreas. Normal enhancement of the pancreas without evidence for pancreatic necrosis. No abscess. The spleen is normal. The adrenal glands and kidneys are unremarkable.  Delete stomach is unremarkable. The duodenum is normal. The small bowel is unremarkable. There is significant fluid and inflammatory phlegmon in the small bowel mesenteries and omentum which appears to be causing moderate inflammation of the transverse colon. The appendix is normal.  No mesenteric or retroperitoneal mass or adenopathy. The aorta demonstrates moderate atherosclerotic calcifications. No focal aneurysm or dissection. Dense calcifications also noted at the major branch vessel ostia. There is a moderate amount of free pelvic fluid. No pelvic mass or adenopathy. The bladder is decompressed with Foley catheter. The uterus and ovaries are unremarkable. No inguinal mass or adenopathy.  The bony structures are unremarkable.  IMPRESSION: CT findings consistent with acute uncomplicated pancreatitis.   Moderate inflammatory phlegmon around the pancreas, in the small bowel mesenteric a, omentum and pelvis. There is inflammation of the transverse colon.  The plastic biliary stent appears to be in good position. No intrahepatic biliary dilatation.   Electronically Signed   By: Loralie Champagne M.D.   On: 07/21/2013 11:52   Dg Chest Port 1 View  07/25/2013   CLINICAL DATA:  confirm line placement  EXAM: PORTABLE CHEST - 1 VIEW  COMPARISON:  Portable chest radiograph 07/20/2013.  FINDINGS: Right-sided central venous catheter via subclavian approach to the level superior vena cava. There is prominence of interstitial markings, mild peribronchial cuffing. Cardiac silhouette is prominent. Minimal increased density left lung base and blunting of the left costophrenic angle. No focal region of consolidation. Degenerative changes within the shoulders. No pneumothorax.  IMPRESSION: Central venous catheter tip superior vena cava.  Pulmonary vascular congestion/mild edema  Subsegmental atelectasis left lung and small effusion.   Electronically Signed   By: Salome Holmes M.D.   On: 07/25/2013 10:56   Dg Chest Port 1 View  07/20/2013   CLINICAL DATA:  Fever.  EXAM: PORTABLE CHEST - 1 VIEW  COMPARISON:  Chest x-ray 07/19/2013.  FINDINGS: Film is underpenetrated limiting the diagnostic sensitivity and specificity of this examination. Lung volumes are low. There are some bibasilar opacities favored to predominantly reflect subsegmental atelectasis, although underlying airspace consolidation from aspiration or developing infection is difficult to exclude, particularly at the left lung base. Trace left pleural effusion. Pulmonary venous congestion, accentuated by low lung volumes, without frank pulmonary edema. Heart size is normal. Upper mediastinal contours are within normal limits. Atherosclerosis in the thoracic aorta. Orthopedic fixation hardware in the lower cervical spine.  IMPRESSION: 1. Low lung volumes with probable  bibasilar subsegmental atelectasis and small left pleural effusion.   Electronically Signed   By: Trudie Reed M.D.   On: 07/20/2013 19:06   Dg Ercp With Sphincterotomy  07/18/2013   CLINICAL DATA:  ERCP, sphincterotomy, balloon dilatation and stent placement  EXAM: ERCP  TECHNIQUE: Multiple spot images obtained with the fluoroscopic device and submitted for interpretation post-procedure.  COMPARISON:  None.  FINDINGS: A total of 6 intraoperative spot images demonstrate first cannulation of the pancreatic  duct with pancreatic ductogram. No stenosis, stricture, focal dilatation or stone. Subsequently, the common bile duct is cannulated and a wire advanced the right intrahepatic biliary radicles. Sphincterotomy and balloon sweeping of the biliary tree was then performed. The final images demonstrate placement of a plastic biliary stent extending from the common bile duct into the duodenum.  IMPRESSION: ERCP, sphincterotomy, balloon sweeping and a plastic biliary stent placement as above.  These images were submitted for radiologic interpretation only. Please see the procedural report for the amount of contrast and the fluoroscopy time utilized.   Electronically Signed   By: Malachy Moan M.D.   On: 07/18/2013 09:27   Dg Abd Acute W/chest  07/19/2013   CLINICAL DATA:  Right upper quadrant abdominal pain  EXAM: ACUTE ABDOMEN SERIES (ABDOMEN 2 VIEW & CHEST 1 VIEW)  COMPARISON:  Recent prior ERCP 07/18/2013  FINDINGS: Mild dependent atelectasis in the lower lobes. Cardiac and mediastinal contours are within normal limits. Atherosclerotic calcification present in the transverse aorta. No focal consolidation, pleural effusion or pneumothorax. Incompletely imaged anterior cervical stabilization hardware.  No evidence of bowel obstruction. A plastic biliary stent projects over the right upper quadrant in the expected location. There is associated pneumobilia. Surgical clips suggest prior cholecystectomy. No  evidence of bowel obstruction, free air or ascites. No organomegaly or suspicious calcification. Multilevel degenerative spurring throughout the lumbar spine. No acute osseous abnormality.  IMPRESSION: 1. Nonobstructed bowel gas pattern. 2. No evidence for free air. 3. Plastic biliary stent with expected pneumobilia.   Electronically Signed   By: Malachy Moan M.D.   On: 07/19/2013 10:04    Microbiology: No results found for this or any previous visit (from the past 240 hour(s)).   Labs: Basic Metabolic Panel:  Recent Labs Lab 07/24/13 1830 07/25/13 4098 07/26/13 1191 07/27/13 0759 07/28/13 0558 07/29/13 0628 07/30/13 0519 07/31/13 0445  NA 137 138 137 137 141 142 141 140  K 3.1* 3.2* 3.0* 2.9* 3.1* 3.1* 3.5* 3.4*  CL 95* 98 95* 94* 96 98 97 98  CO2 28 29 31  32 32 32 29 29  GLUCOSE 110* 146* 129* 141* 131* 123* 123* 127*  BUN 6 6 5* 8 12 12 13 15   CREATININE 0.56 0.62 0.65 0.58 0.63 0.64 0.65 0.66  CALCIUM 8.4 8.3* 8.3* 8.4 8.6 8.5 8.9 9.1  MG  --  1.7 1.8  --  1.9  --   --  2.1  PHOS  --  1.8* 3.5 3.2 4.1  --   --  4.6   Liver Function Tests:  Recent Labs Lab 07/24/13 1830 07/26/13 0614 07/28/13 0558 07/31/13 0445  AST 60* 27 26 20   ALT 40* 25 18 14   ALKPHOS 335* 292* 322* 256*  BILITOT 0.5 0.3 0.3 0.3  PROT 6.1 5.9* 6.3 6.7  ALBUMIN 2.2* 2.1* 2.2* 2.3*   No results found for this basename: LIPASE, AMYLASE,  in the last 168 hours No results found for this basename: AMMONIA,  in the last 168 hours CBC:  Recent Labs Lab 07/25/13 0508 07/26/13 0614 07/28/13 0558 07/28/13 0757 07/31/13 0445  WBC 10.0 10.6* 12.9* 9.6 9.2  NEUTROABS  --  7.9* 9.1*  --   --   HGB 9.9* 10.4* 5.9* 9.7* 10.6*  HCT 29.7* 31.3* 17.9* 29.3* 31.9*  MCV 92.0 92.1 92.3 91.8 91.7  PLT 237 247 361 258 308   Cardiac Enzymes: No results found for this basename: CKTOTAL, CKMB, CKMBINDEX, TROPONINI,  in the last 168 hours BNP:  BNP (last 3 results) No results found for this basename:  PROBNP,  in the last 8760 hours CBG:  Recent Labs Lab 07/30/13 1632 07/30/13 2057 07/31/13 0100 07/31/13 0350 07/31/13 0734  GLUCAP 115* 94 117* 126* 125*       Signed:  Morgen Ritacco M  [facility] 07/31/2013, 10:00 AM

## 2013-07-31 NOTE — Progress Notes (Signed)
Subjective; Patient states she had some lower back pain this morning but she did not feet for long. She denies nausea vomiting or abdominal pain. She says she is hungry.  Objective; BP 146/69  Pulse 66  Temp(Src) 98.2 F (36.8 C) (Oral)  Resp 18  Ht 5\' 4"  (1.626 m)  Wt 173 lb 3.2 oz (78.563 kg)  BMI 29.72 kg/m2  SpO2 96% Patient is alert and in no acute distress. Abdomen is full. Bowel sounds are normal. Abdomen is soft with mild midepigastric tenderness. Fullness history of decreased since yesterday. Trace edema around ankles. Urine output 1575 mL last 24-hours.  Lab data; WBC 9.2, H&H 10.6 and 31.9 and platelet count 308K  Serum sodium 140, potassium 3.4, chloride 98, CO2 29, BUN 15, creatinine 0.66 Glucose 127. Serum calcium 9.1 Bilirubin 0.3, a PO2 56, AST 20, ALT 14, albumin 2.3 Magnesium 2.1 Phosphorus 4.6.     Assessment;  #1.Post ERCP pancreatitis. Patient is not having any symptoms pertaining to pancreatitis. Abdominal exam suggests resolution of peripancreatic fluid as fullness has decreased. #2. Benign distal CBD stricture. Status post ERCP with sphincterotomy and stenting on 07/18/2013.  #4. Malnutrition. No problems noted with TPN. Serum albumin is coming up. #5. Hypokalemia. Patient has received 20 mEq KCl IV. Lasix dose has been decreased. #6. Right lumbar pain. Good pain control with Lidoderm patch and she has not required pain medication in over 24 hours.  Recommendations; Agree with plans for patient to go home today. She will be seen in the office on 08/07/2013 at 3:30 PM. She will have metabolic 7 on 08/04/2013. Need for followup CT and timing of oral feeding will be determined at next office visit.

## 2013-07-31 NOTE — Progress Notes (Signed)
NUTRITION FOLLOW UP  Intervention:   TPN per pharmacy  Nutrition Dx:   Inadequate oral intake related to altered GI function as evidenced by NPO/clear liquids x 6 days, TPN initiation; ongoing  Goal:   Pt will meet >75% of estimated nutritional needs; onoging  Monitor:   TPN management and tolerance, diet advancement, labs, weight changes, I/O's  Assessment:   Pt s/p ERCP on 07/18/13 due to elevated LFT's, which revealed distal biliary stricture. Sphincterotomy was completed (brushings pending). Pt developed urinary retention after procedure. She was admitted on 07/19/13 with acute biliary pancreatitis.   Per gastroenterology, brush cytology from the stricture revealed reactive/reparative changes. Abdominal distention has improved, however, pt is still not ready to tolerate oral feeding.   Pt remains on TPN; Clinimix 5/15 at goal rate of 80 ml/hr (which provides 1363 kcals and 96 grams protein). Regimen also includes daily MVI and minerals. Also receiving Intralipid 20% at 10 ml/hr (which provides 480 kcals). Total TPN regimen meets 103% of estimated kcal needs and 81% of estimated protein needs.   Noted Mg and Phos WNL. K: 3.5 (low normal); improved likely due to repletion efforts. Per MD, pt also on furosemide, which may be a contributing factor to hypokalemia. MD considering diuretic use only on an as needed basis. IVF rate has been decreased to 35 ml/hr.   Per MD, anticipate d/c home today with home health.   Height: Ht Readings from Last 1 Encounters:  07/19/13 5\' 4"  (1.626 m)    Weight Status:   Wt Readings from Last 1 Encounters:  07/30/13 173 lb 3.2 oz (78.563 kg)    Re-estimated needs:  Kcal: [id number]  Protein: 119-139 grams  Fluid: 1.9-2.1 L  Skin: rash on medial back  Diet Order: NPO   Intake/Output Summary (Last 24 hours) at 07/31/13 0806 Last data filed at 07/31/13 0719  Gross per 24 hour  Intake 3156.92 ml  Output   1676 ml  Net 1480.92 ml    Last BM:  07/30/13   Labs:   Recent Labs Lab 07/26/13 0614 07/27/13 0759 07/28/13 0558 07/29/13 0628 07/30/13 0519 07/31/13 0445  NA 137 137 141 142 141 140  K 3.0* 2.9* 3.1* 3.1* 3.5* 3.4*  CL 95* 94* 96 98 97 98  CO2 31 32 32 32 29 29  BUN 5* 8 12 12 13 15   CREATININE 0.65 0.58 0.63 0.64 0.65 0.66  CALCIUM 8.3* 8.4 8.6 8.5 8.9 9.1  MG 1.8  --  1.9  --   --  2.1  PHOS 3.5 3.2 4.1  --   --  4.6  GLUCOSE 129* 141* 131* 123* 123* 127*    CBG (last 3)   Recent Labs  07/31/13 0100 07/31/13 0350 07/31/13 0734  GLUCAP 117* 126* 125*    Scheduled Meds: . amLODipine  5 mg Oral Daily  . ARIPiprazole  10 mg Oral QHS  . calcium carbonate  1 tablet Oral BID  . enoxaparin (LOVENOX) injection  40 mg Subcutaneous Q24H  . furosemide  40 mg Oral Daily  . hydrocortisone cream   Topical BID  . insulin aspart  0-9 Units Subcutaneous 6 times per day  . lamoTRIgine  200 mg Oral Daily  . lidocaine  1 patch Transdermal Q24H  . losartan  100 mg Oral Daily  . metoprolol tartrate  12.5 mg Oral BID  . sodium chloride  10-40 mL Intracatheter Q12H  . vitamin B-12  1,000 mcg Oral Daily  . zolpidem  5 mg  Oral QHS    Continuous Infusions: . sodium chloride 35 mL/hr at 07/31/13 0355  . Marland Kitchen.TPN (CLINIMIX-E) Adult 80 mL/hr at 07/30/13 1757   And  . fat emulsion 250 mL (07/30/13 1755)    Shivan Hodes A. Mayford KnifeWilliams, RD, LDN Pager: [phone]

## 2013-08-07 ENCOUNTER — Encounter (INDEPENDENT_AMBULATORY_CARE_PROVIDER_SITE_OTHER): Payer: Self-pay | Admitting: *Deleted

## 2013-08-07 ENCOUNTER — Encounter (INDEPENDENT_AMBULATORY_CARE_PROVIDER_SITE_OTHER): Payer: Self-pay | Admitting: Internal Medicine

## 2013-08-07 ENCOUNTER — Ambulatory Visit (INDEPENDENT_AMBULATORY_CARE_PROVIDER_SITE_OTHER): Payer: Medicare Other | Admitting: Internal Medicine

## 2013-08-07 VITALS — BP 110/70 | HR 66 | Temp 97.6°F | Resp 18 | Ht 64.0 in | Wt 167.4 lb

## 2013-08-07 DIAGNOSIS — K859 Acute pancreatitis without necrosis or infection, unspecified: Secondary | ICD-10-CM

## 2013-08-07 DIAGNOSIS — K858 Other acute pancreatitis without necrosis or infection: Secondary | ICD-10-CM

## 2013-08-07 NOTE — Progress Notes (Signed)
Presenting complaint;  Follow for ERCP pancreatitis.  Subjective:  Patient is 73 year old Caucasian female who developed severe pancreatitis following therapeutic ERCP and was hospitalized between 0/20/2015 and 07/31/2013. She was discharged one week ago. She is on home TPN. She says she is feeling better. She has had weak spells. She was allowed to take sips of apple juice and she is not having any difficulty. She is also able to keep her pills with sips of water. She has taken 3 or 4 doses of pain medication since discharge primarily for right lumbar pain. At times she has felt cold she has not had chills or fever. She has not taken Lasix and her potassium for the last 5 days. He denies abdominal pain. She is hungry and wonders when she can go back to eating. Her husband states that they have one more day of TPN which is to end on 08/09/2013.   Current Medications: Outpatient Encounter Prescriptions as of 08/07/2013  Medication Sig  . amLODipine (NORVASC) 5 MG tablet Take 5 mg by mouth daily.  . ARIPiprazole (ABILIFY) 10 MG tablet Take 10 mg by mouth at bedtime.  Marland Kitchen. aspirin EC 81 MG tablet Take 81 mg by mouth daily.  . calcium carbonate (TUMS - DOSED IN MG ELEMENTAL CALCIUM) 500 MG chewable tablet Chew 1 tablet by mouth 2 (two) times daily.  . Cholecalciferol (VITAMIN D) 400 UNITS capsule Take 400 Units by mouth daily.  Marland Kitchen. Fish Oil-Cholecalciferol (FISH OIL + D3 PO) Take 1,200 mg by mouth 2 (two) times daily.   . hydrocortisone cream 1 % Apply topically 2 (two) times daily.  . Lactobacillus (PROBIOTIC ACIDOPHILUS PO) Take by mouth daily.  Marland Kitchen. lamoTRIgine (LAMICTAL) 200 MG tablet Take 200 mg by mouth daily.   Marland Kitchen. lidocaine (XYLOCAINE) 4 % external solution Apply topically as needed.  Marland Kitchen. losartan (COZAAR) 100 MG tablet Take 100 mg by mouth daily.  . metoprolol tartrate (LOPRESSOR) 25 MG tablet Take 12.5 mg by mouth 2 (two) times daily. Patient takes 1/2 tablet twice a day  . oxyCODONE (OXY  IR/ROXICODONE) 5 MG immediate release tablet Take 1 tablet (5 mg total) by mouth every 4 (four) hours as needed for severe pain.  . vitamin B-12 (CYANOCOBALAMIN) 1000 MCG tablet Take 1,000 mcg by mouth daily.  Marland Kitchen. zolpidem (AMBIEN) 10 MG tablet Take 10 mg by mouth at bedtime.  . dicyclomine (BENTYL) 10 MG capsule Take 1 capsule (10 mg total) by mouth 3 (three) times daily as needed for spasms.  . furosemide (LASIX) 20 MG tablet Take 1 tablet (20 mg total) by mouth daily as needed for fluid or edema.  . meloxicam (MOBIC) 15 MG tablet Take 15 mg by mouth daily.  . simvastatin (ZOCOR) 20 MG tablet Take 20 mg by mouth daily. In the afternoon     Objective: Blood pressure 110/70, pulse 66, temperature 97.6 F (36.4 C), temperature source Oral, resp. rate 18, height 5\' 4"  (1.626 m), weight 167 lb 6.4 oz (75.932 kg). Patient is alert and in no acute distress. Conjunctiva is pink. Sclera is nonicteric Oropharyngeal mucosa is normal. No neck masses or thyromegaly noted. Cardiac exam with regular rhythm normal S1 and S2. No murmur or gallop noted. Lungs are clear to auscultation. Abdomen is full. Bowel sounds are normal. On palpation abdomen is soft with mild midepigastric tenderness. She remains with fullness in this area.  No LE edema or clubbing noted. She has trace edema around left ankle. PICC line is in place the entry  site a right brachial vein. Site is nontender and there is no erythema to skin.  Labs/studies Results: Lab data from 08/04/2013 WBC 4.78 H&H 13.3 and 40.9 and platelet count 234K Serum sodium 137, potassium 4.4, carotid 11, CO2 27, glucose 374, BUN 21 and creatinine 0.6 Serum calcium 8.5. Pre-albumin 31.8. Magnesium 2.4. Bilirubin 0.2, AP 216, AST 36, AST 29, total protein 6.5 and albumin 2.5.  Repeat glucose from peripheral site was 105.  Assessment:  #1. Post-ERCP pancreatitis. Significant improvement since she was discharged from the hospital one week ago. She has  mild tenderness in epigastric region and fullness has decreased significantly to She remains on TPN. Need to document decrease in peripancreatic fluid before oral feeding begun. #2. Malnutrition has improved with TPN. Pre-albumin is normal. #3. 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 Prior cytology from bile duct reveals reactive epithelium    Plan:  Patient didn't have one dose of apple juice x6. Will proceed with abdominopelvic CT with contrast in am to make sure she is not developing pseudocyst. Decision regarding oral feeding will be made once CT completed and reviewed. Office visit in 4 weeks.

## 2013-08-07 NOTE — Patient Instructions (Addendum)
Abdominal pelvic CT with contrast to be scheduled. Notify if you have temperature greater than 100. Apple juice 1 ounce to 6 times a day

## 2013-08-08 ENCOUNTER — Ambulatory Visit (HOSPITAL_COMMUNITY): Payer: Medicare Other

## 2013-08-08 ENCOUNTER — Telehealth (INDEPENDENT_AMBULATORY_CARE_PROVIDER_SITE_OTHER): Payer: Self-pay | Admitting: *Deleted

## 2013-08-08 NOTE — Telephone Encounter (Signed)
Dr. Karilyn Cotaehman also ask that [facility] called @ [phone]. He ask that the TPN be continued at least for 1 more week. He ask that the home health nurse collect a C- Diff today at the time of her visit. These orders were given to the nurse , Fannie KneeSue. She will fax results to our office, or page Dr.Rehman with results over the weekend.

## 2013-08-08 NOTE — Telephone Encounter (Signed)
Lindsay Palmer - Dr.Rehman called and states that the patient is having diarrhea today and will not be able to come to appointment today. He ask that this be rescheduled for Monday of Tuesday next week.

## 2013-08-08 NOTE — Telephone Encounter (Signed)
CT resch'd to 08/12/13 at 100 ([phone]), patient aware

## 2013-08-12 ENCOUNTER — Encounter (HOSPITAL_COMMUNITY): Payer: Self-pay

## 2013-08-12 ENCOUNTER — Ambulatory Visit (HOSPITAL_COMMUNITY)
Admission: RE | Admit: 2013-08-12 | Discharge: 2013-08-12 | Disposition: A | Discharge status: Home / Self Care | Payer: Medicare Other | Source: Ambulatory Visit | Attending: Internal Medicine | Admitting: Internal Medicine

## 2013-08-12 DIAGNOSIS — Z09 Encounter for follow-up examination after completed treatment for conditions other than malignant neoplasm: Secondary | ICD-10-CM | POA: Insufficient documentation

## 2013-08-12 DIAGNOSIS — Z9889 Other specified postprocedural states: Secondary | ICD-10-CM | POA: Insufficient documentation

## 2013-08-12 DIAGNOSIS — R109 Unspecified abdominal pain: Secondary | ICD-10-CM | POA: Insufficient documentation

## 2013-08-12 DIAGNOSIS — K859 Acute pancreatitis without necrosis or infection, unspecified: Secondary | ICD-10-CM | POA: Insufficient documentation

## 2013-08-12 DIAGNOSIS — K858 Other acute pancreatitis without necrosis or infection: Secondary | ICD-10-CM

## 2013-08-12 MED ORDER — IOHEXOL 300 MG/ML  SOLN
100.0000 mL | Freq: Once | INTRAMUSCULAR | Status: AC | PRN
  Administered 2013-08-12: 100 mL via INTRAVENOUS

## 2013-08-21 ENCOUNTER — Emergency Department (HOSPITAL_COMMUNITY)
Admission: EM | Admit: 2013-08-21 | Discharge: 2013-08-21 | Disposition: A | Discharge status: Home / Self Care | Payer: Medicare Other | Attending: Internal Medicine | Admitting: Internal Medicine

## 2013-08-21 ENCOUNTER — Encounter (HOSPITAL_COMMUNITY): Payer: Self-pay | Admitting: Emergency Medicine

## 2013-08-21 ENCOUNTER — Emergency Department (HOSPITAL_COMMUNITY): Payer: Medicare Other

## 2013-08-21 DIAGNOSIS — R1013 Epigastric pain: Secondary | ICD-10-CM | POA: Insufficient documentation

## 2013-08-21 DIAGNOSIS — Z79899 Other long term (current) drug therapy: Secondary | ICD-10-CM | POA: Insufficient documentation

## 2013-08-21 DIAGNOSIS — Z8719 Personal history of other diseases of the digestive system: Secondary | ICD-10-CM | POA: Diagnosis not present

## 2013-08-21 DIAGNOSIS — Z8679 Personal history of other diseases of the circulatory system: Secondary | ICD-10-CM | POA: Insufficient documentation

## 2013-08-21 DIAGNOSIS — I10 Essential (primary) hypertension: Secondary | ICD-10-CM | POA: Diagnosis not present

## 2013-08-21 DIAGNOSIS — IMO0002 Reserved for concepts with insufficient information to code with codable children: Secondary | ICD-10-CM | POA: Diagnosis not present

## 2013-08-21 DIAGNOSIS — K863 Pseudocyst of pancreas: Secondary | ICD-10-CM

## 2013-08-21 DIAGNOSIS — E559 Vitamin D deficiency, unspecified: Secondary | ICD-10-CM | POA: Insufficient documentation

## 2013-08-21 DIAGNOSIS — F3189 Other bipolar disorder: Secondary | ICD-10-CM | POA: Diagnosis not present

## 2013-08-21 DIAGNOSIS — G43909 Migraine, unspecified, not intractable, without status migrainosus: Secondary | ICD-10-CM | POA: Insufficient documentation

## 2013-08-21 DIAGNOSIS — Z7982 Long term (current) use of aspirin: Secondary | ICD-10-CM | POA: Insufficient documentation

## 2013-08-21 LAB — COMPREHENSIVE METABOLIC PANEL
ALBUMIN: 3.3 g/dL — AB (ref 3.5–5.2)
ALT: 21 U/L (ref 0–35)
AST: 23 U/L (ref 0–37)
Alkaline Phosphatase: 144 U/L — ABNORMAL HIGH (ref 39–117)
Anion gap: 12 (ref 5–15)
BUN: 12 mg/dL (ref 6–23)
CHLORIDE: 102 meq/L (ref 96–112)
CO2: 29 mEq/L (ref 19–32)
CREATININE: 0.9 mg/dL (ref 0.50–1.10)
Calcium: 9.4 mg/dL (ref 8.4–10.5)
GFR calc Af Amer: 72 mL/min — ABNORMAL LOW (ref >90)
GFR calc non Af Amer: 62 mL/min — ABNORMAL LOW (ref >90)
Glucose, Bld: 108 mg/dL — ABNORMAL HIGH (ref 70–99)
Potassium: 3.9 mEq/L (ref 3.7–5.3)
Sodium: 143 mEq/L (ref 137–147)
Total Bilirubin: 0.4 mg/dL (ref 0.3–1.2)
Total Protein: 7.2 g/dL (ref 6.0–8.3)

## 2013-08-21 LAB — CBC
HEMATOCRIT: 36 % (ref 36.0–46.0)
Hemoglobin: 11.7 g/dL — ABNORMAL LOW (ref 12.0–15.0)
MCH: 29.7 pg (ref 26.0–34.0)
MCHC: 32.5 g/dL (ref 30.0–36.0)
MCV: 91.4 fL (ref 78.0–100.0)
Platelets: 206 10*3/uL (ref 150–400)
RBC: 3.94 MIL/uL (ref 3.87–5.11)
RDW: 14.7 % (ref 11.5–15.5)
WBC: 4.7 10*3/uL (ref 4.0–10.5)

## 2013-08-21 LAB — AMYLASE: Amylase: 94 U/L (ref 0–105)

## 2013-08-21 LAB — LIPASE, BLOOD: Lipase: 32 U/L (ref 11–59)

## 2013-08-21 LAB — TROPONIN I

## 2013-08-21 MED ORDER — SODIUM CHLORIDE 0.9 % IV SOLN
INTRAVENOUS | Status: DC
  Administered 2013-08-21: 10:00:00 via INTRAVENOUS

## 2013-08-21 MED ORDER — IOHEXOL 300 MG/ML  SOLN
100.0000 mL | Freq: Once | INTRAMUSCULAR | Status: AC | PRN
  Administered 2013-08-21: 100 mL via INTRAVENOUS

## 2013-08-21 MED ORDER — IOHEXOL 300 MG/ML  SOLN
50.0000 mL | Freq: Once | INTRAMUSCULAR | Status: AC | PRN
  Administered 2013-08-21: 50 mL via ORAL

## 2013-08-21 NOTE — Discharge Summary (Signed)
ED  Summary  Patient ID: Lindsay Palmer MRN: [id number] DOB/AGE: [birth year] 73 y.o.  Admit date: 08/21/2013 Discharge date: 08/21/2013    Discharge Diagnoses:  Pancreatic pseudocyst stable. Mild anemia. Noncardiac chest pain.  Discharged Condition: Stable.  Summary of events in emergency room. Please refer to my initial assessment. Patient was stable on initial evaluation. EKG revealed normal sinus rhythm with intermittent PACs she has history of. Lab studies included normal troponin level, serum amylase and lipase. CBC revealed normal WBC and platelet count and hemoglobin of 11.7 which was just below normal but better than 3 weeks ago when she was discharged from this facility. Mild anemia was felt to be secondary to recent acute illness. Transaminases were normal and alkaline phosphatase was mildly elevated and once again trending downward. Abdominopelvic CT was obtained with contrast to reassess pancreatic pseudocyst and peripancreatic edema and also to make sure that there is stent was desirable position. Biliary stent was in correct position. Pseudocyst anterior to pancreas was unchanged in edema and transverse medial colon appear to have decreased somewhat. Therefore there was no evidence of recurrent pancreatitis or enlarging pseudocyst. Lower sternal pain was felt to be due to the soft diaphragmatic process or GERD. Patient did not require any medications in emergency room. She and her husband were reassured and discharged home. Patient was advised to eat 6 small meals; low-fat diet. She was encouraged to take pain medication on as-needed basis. She will call if she has pain not controlled with pain medication or she has tan-gray than 101F. Patient will be evaluated in the office on 09/09/2013.        Medication List    ASK your doctor about these medications       amLODipine 5 MG tablet  Commonly known as:  NORVASC  Take 5 mg by mouth daily.     ARIPiprazole 10 MG  tablet  Commonly known as:  ABILIFY  Take 10 mg by mouth at bedtime.     aspirin EC 81 MG tablet  Take 81 mg by mouth daily at 12 noon.     calcium carbonate 500 MG chewable tablet  Commonly known as:  TUMS - dosed in mg elemental calcium  Chew 1 tablet by mouth 2 (two) times daily.     dicyclomine 10 MG capsule  Commonly known as:  BENTYL  Take 1 capsule (10 mg total) by mouth 3 (three) times daily as needed for spasms.     FISH OIL + D3 PO  Take 1,200 mg by mouth 2 (two) times daily.     hydrocortisone cream 1 %  Apply topically 2 (two) times daily.     lamoTRIgine 200 MG tablet  Commonly known as:  LAMICTAL  Take 200 mg by mouth daily.     losartan 100 MG tablet  Commonly known as:  COZAAR  Take 100 mg by mouth daily.     meloxicam 15 MG tablet  Commonly known as:  MOBIC  Take 15 mg by mouth daily.     metoprolol tartrate 25 MG tablet  Commonly known as:  LOPRESSOR  Take 12.5 mg by mouth 2 (two) times daily.     oxyCODONE 5 MG immediate release tablet  Commonly known as:  Oxy IR/ROXICODONE  Take 1 tablet (5 mg total) by mouth every 4 (four) hours as needed for severe pain.     PROBIOTIC ACIDOPHILUS PO  Take 1 capsule by mouth daily.     simvastatin 20 MG tablet  Commonly  known as:  ZOCOR  Take 20 mg by mouth every evening.     vitamin B-12 1000 MCG tablet  Commonly known as:  CYANOCOBALAMIN  Take 1,000 mcg by mouth daily.     Vitamin D 400 UNITS capsule  Take 400 Units by mouth daily.     zolpidem 10 MG tablet  Commonly known as:  AMBIEN  Take 10 mg by mouth at bedtime.         SignedMalissa Palmer: Lindsay Palmer U 08/21/2013, 11:46 AM

## 2013-08-21 NOTE — ED Notes (Signed)
Presenting complaint; Intermittent epigastric and chest pain since yesterday.  History of present illness; Patient is 73 year old Caucasian female who developed post ERCP pancreatitis and was hospitalized from 07/19/2013 to 07/31/2013. Patient was discharged on TPN. She was seen in the office on 08/07/2013 and was doing well. She underwent abdominopelvic CT on 08/12/2013 and noted to have decrease in fluid in transverse mesocolon and the other fluid collection was noted to be organizing. Patient was begun on full liquids for 12 days ago and diet advanced. TPN was discontinued over the weekend. Patient develop epigastric and lower sternal pain after lunch and vomited once. She also noted low-grade temp yesterday. She once again noted epigastric and lower sternal pain yesterday. And was relieved with OTC antacids and aspirin. This morning she feels better. She still having epigastric pain and had one episode of lower sternal pain. She denies fever chills or shortness of breath. She also denies melena or rectal bleeding. She had 2 loose stools yesterday. Current Facility-Administered Medications  Medication Dose Route Frequency Provider Last Rate Last Dose  . 0.9 %  sodium chloride infusion   Intravenous Continuous Malissa Hippo, MD 50 mL/hr at 08/21/13 4098     Current Outpatient Prescriptions  Medication Sig Dispense Refill  . amLODipine (NORVASC) 5 MG tablet Take 5 mg by mouth daily.      . ARIPiprazole (ABILIFY) 10 MG tablet Take 10 mg by mouth at bedtime.      Marland Kitchen aspirin EC 81 MG tablet Take 81 mg by mouth daily.      . calcium carbonate (TUMS - DOSED IN MG ELEMENTAL CALCIUM) 500 MG chewable tablet Chew 1 tablet by mouth 2 (two) times daily.      . Cholecalciferol (VITAMIN D) 400 UNITS capsule Take 400 Units by mouth daily.      Marland Kitchen dicyclomine (BENTYL) 10 MG capsule Take 1 capsule (10 mg total) by mouth 3 (three) times daily as needed for spasms.  60 capsule  1  . Fish Oil-Cholecalciferol (FISH  OIL + D3 PO) Take 1,200 mg by mouth 2 (two) times daily.       . hydrocortisone cream 1 % Apply topically 2 (two) times daily.  30 g  0  . Lactobacillus (PROBIOTIC ACIDOPHILUS PO) Take by mouth daily.      Marland Kitchen lamoTRIgine (LAMICTAL) 200 MG tablet Take 200 mg by mouth daily.       Marland Kitchen losartan (COZAAR) 100 MG tablet Take 100 mg by mouth daily.      . meloxicam (MOBIC) 15 MG tablet Take 15 mg by mouth daily.      . metoprolol tartrate (LOPRESSOR) 25 MG tablet Take 12.5 mg by mouth 2 (two) times daily. Patient takes 1/2 tablet twice a day      . oxyCODONE (OXY IR/ROXICODONE) 5 MG immediate release tablet Take 1 tablet (5 mg total) by mouth every 4 (four) hours as needed for severe pain.  15 tablet  0  . simvastatin (ZOCOR) 20 MG tablet Take 20 mg by mouth daily. In the afternoon      . vitamin B-12 (CYANOCOBALAMIN) 1000 MCG tablet Take 1,000 mcg by mouth daily.      Marland Kitchen zolpidem (AMBIEN) 10 MG tablet Take 10 mg by mouth at bedtime.       Physical exam; BP 153/62  Pulse 70  Temp(Src) 97.8 F (36.6 C) (Oral)  Resp 18  Ht 5\' 4"  (1.626 m)  Wt 167 lb (75.751 kg)  BMI 28.65 kg/m2  SpO2  98% Patient is alert and in no acute distress. Conjunctivae pink. Sclerae nonicteric. No neck mass or thyromegaly noted. Cardiac exam with occasional irregularity. Normal S1 and S2. No murmur or gallop noted. Lungs are clear to auscultation. Abdomen is full. Bowel sounds are normal. Abdomen is soft with mild tenderness across upper abdomen. No organomegaly or masses noted. No peripheral edema or clubbing noted.  Assessment; Patient's epigastric and lower sternal pain ongoing pancreatitis. Need to make sure that  peripancreatic fluid collection has not increased. Patient does not appear to be acutely ill.  Plan; CBC, comprehensive chemistry panel, troponin level, serum amylase and lipase. 12-lead EKG. Abdominal pelvic CT with contrast.

## 2013-08-21 NOTE — Discharge Instructions (Signed)
Resume usual medications. Six small meals; low fat diet. Call for temp greater than 101F or if pain not relieved with pain medication. Office visit as planned..Marland Kitchen

## 2013-08-21 NOTE — ED Notes (Signed)
Pt states she is a pt of Dr. Karilyn Cotaehman and has been having upper abdominal pain with N/V, started 2 days ago. Pt denies emesis today, pain 5/10, denies diarrhea. Pt has HX of pancreatitis.

## 2013-08-21 NOTE — ED Notes (Signed)
Discharge instructions reviewed with pt, questions answered. Pt verbalized understanding.  

## 2013-08-23 ENCOUNTER — Other Ambulatory Visit (INDEPENDENT_AMBULATORY_CARE_PROVIDER_SITE_OTHER): Payer: Self-pay | Admitting: Internal Medicine

## 2013-08-23 MED ORDER — ONDANSETRON HCL 4 MG PO TABS: 4.0000 mg | ORAL_TABLET | Freq: Three times a day (TID) | ORAL | Status: DC | PRN

## 2013-08-23 MED ORDER — PANTOPRAZOLE SODIUM 40 MG PO TBEC: 40.0000 mg | DELAYED_RELEASE_TABLET | Freq: Two times a day (BID) | ORAL | Status: DC

## 2013-09-03 ENCOUNTER — Other Ambulatory Visit (INDEPENDENT_AMBULATORY_CARE_PROVIDER_SITE_OTHER): Payer: Self-pay | Admitting: Internal Medicine

## 2013-09-03 DIAGNOSIS — R109 Unspecified abdominal pain: Secondary | ICD-10-CM

## 2013-09-03 DIAGNOSIS — K5900 Constipation, unspecified: Secondary | ICD-10-CM

## 2013-09-04 ENCOUNTER — Ambulatory Visit (HOSPITAL_COMMUNITY)
Admission: RE | Admit: 2013-09-04 | Discharge: 2013-09-04 | Disposition: A | Discharge status: Home / Self Care | Payer: Medicare Other | Source: Ambulatory Visit | Attending: Internal Medicine | Admitting: Internal Medicine

## 2013-09-04 DIAGNOSIS — K5900 Constipation, unspecified: Secondary | ICD-10-CM | POA: Diagnosis not present

## 2013-09-04 DIAGNOSIS — R109 Unspecified abdominal pain: Secondary | ICD-10-CM

## 2013-09-05 ENCOUNTER — Encounter (INDEPENDENT_AMBULATORY_CARE_PROVIDER_SITE_OTHER): Payer: Self-pay | Admitting: Internal Medicine

## 2013-09-05 ENCOUNTER — Ambulatory Visit (HOSPITAL_COMMUNITY)
Admission: RE | Admit: 2013-09-05 | Discharge: 2013-09-05 | Disposition: A | Discharge status: Home / Self Care | Payer: Medicare Other | Source: Ambulatory Visit | Attending: Internal Medicine | Admitting: Internal Medicine

## 2013-09-05 ENCOUNTER — Telehealth (INDEPENDENT_AMBULATORY_CARE_PROVIDER_SITE_OTHER): Payer: Self-pay | Admitting: *Deleted

## 2013-09-05 ENCOUNTER — Ambulatory Visit (INDEPENDENT_AMBULATORY_CARE_PROVIDER_SITE_OTHER): Payer: Medicare Other | Admitting: Internal Medicine

## 2013-09-05 VITALS — BP 162/68 | HR 68 | Temp 97.5°F | Ht 64.0 in | Wt 162.5 lb

## 2013-09-05 DIAGNOSIS — K5730 Diverticulosis of large intestine without perforation or abscess without bleeding: Secondary | ICD-10-CM | POA: Insufficient documentation

## 2013-09-05 DIAGNOSIS — K5900 Constipation, unspecified: Secondary | ICD-10-CM

## 2013-09-05 NOTE — Telephone Encounter (Signed)
Apt has been scheduled for 09/05/13 at 10:30 am with Dorene Arerri Setzer, NP.

## 2013-09-05 NOTE — Telephone Encounter (Signed)
Per Dr.Rehman call the patient. If she has not had BM , she is to come to the office to see Terri. States that he discussed this with Terri this morning.  I called the patient and spoke with her husband. He has administered 2 enema's this morning with no result. He plans to bring her in to see Terri this morning.

## 2013-09-05 NOTE — Progress Notes (Signed)
Subjective:     Patient ID: Lindsay Palmer, female   DOB: 03/19/1940, 73 y.o.   MRN: [id number]  HPI Presents today with c/o constipation. She says she has not had a BM in 12 days.  She has tried suppositories, enema x 2, and MOM.  [facility] was [facility]/2013 with hx of post ERCP  Pancreatitis in June.  Patient appears uncomfortable. She tells me in July when she was discharged she had not had a BM.   09/03/2013 KUB: IMPRESSION:  Moderate to large amount of fecal matter within the colon compatible  with constipation.  Biliary stent in expected position. Small amount of pneumobilia.   Review of Systems Past Medical History  Diagnosis Date  . GERD (gastroesophageal reflux disease)   . Bipolar 2 disorder   . Major depressive disorder   . Chronic insomnia   . Non-toxic multinodular goiter   . Osteopenia   . Vitamin D deficiency   . Migraine   . Elevated LFTs   . Diverticulosis of colon   . H/O first degree atrioventricular block   . Fatty liver disease, nonalcoholic   . Hypertension   . Hypercholesteremia   . Sleep apnea     Stop Bang score of 4. Pt said she had sleep apnea at one time, but she has had another sleep study since then and they said she does not have it anymore.  . Pancreatitis     Past Surgical History  Procedure Laterality Date  . Neck surgery      Stenosis C6-C7  . Cholecystectomy    . Cataract surgery Bilateral 08-2012  . Colonoscopy  2003    Diverticulitis h/o polyps -due next 2015 -gastroscopy 2003  . Sigmoidoscopy  2007    no polyps  . Ercp    . Ercp N/A 07/18/2013    Procedure: ENDOSCOPIC RETROGRADE CHOLANGIOPANCREATOGRAPHY (ERCP) COMMON BILE DUCT BRUSHING;  Surgeon: Malissa HippoNajeeb U Rehman, MD;  Location: [facility];  Service: Endoscopy;  Laterality: N/A;  . Sphincterotomy N/A 07/18/2013    Procedure: SPHINCTEROTOMY;  Surgeon: Malissa HippoNajeeb U Rehman, MD;  Location: [facility];  Service: Endoscopy;  Laterality: N/A;  . Biliary stent placement N/A  07/18/2013    Procedure: BILIARY STENT PLACEMENT;  Surgeon: Malissa HippoNajeeb U Rehman, MD;  Location: [facility];  Service: Endoscopy;  Laterality: N/A;    Allergies  Allergen Reactions  . Codeine Nausea And Vomiting    Current Outpatient Prescriptions on File Prior to Visit  Medication Sig Dispense Refill  . amLODipine (NORVASC) 5 MG tablet Take 5 mg by mouth daily.      . ARIPiprazole (ABILIFY) 10 MG tablet Take 10 mg by mouth at bedtime.      Marland Kitchen. aspirin EC 81 MG tablet Take 81 mg by mouth daily at 12 noon.       . calcium carbonate (TUMS - DOSED IN MG ELEMENTAL CALCIUM) 500 MG chewable tablet Chew 1 tablet by mouth 2 (two) times daily.      . Cholecalciferol (VITAMIN D) 400 UNITS capsule Take 400 Units by mouth daily.      Marland Kitchen. dicyclomine (BENTYL) 10 MG capsule Take 1 capsule (10 mg total) by mouth 3 (three) times daily as needed for spasms.  60 capsule  1  . Fish Oil-Cholecalciferol (FISH OIL + D3 PO) Take 1,200 mg by mouth 2 (two) times daily.       . hydrocortisone cream 1 % Apply topically 2 (two) times daily.  30 g  0  . Lactobacillus (PROBIOTIC ACIDOPHILUS PO) Take 1 capsule by mouth daily.       Marland Kitchen lamoTRIgine (LAMICTAL) 200 MG tablet Take 200 mg by mouth daily.       Marland Kitchen losartan (COZAAR) 100 MG tablet Take 100 mg by mouth daily.      . meloxicam (MOBIC) 15 MG tablet Take 15 mg by mouth daily.      . metoprolol tartrate (LOPRESSOR) 25 MG tablet Take 12.5 mg by mouth 2 (two) times daily.       . ondansetron (ZOFRAN) 4 MG tablet Take 1 tablet (4 mg total) by mouth every 8 (eight) hours as needed for nausea or vomiting.  30 tablet  1  . oxyCODONE (OXY IR/ROXICODONE) 5 MG immediate release tablet Take 1 tablet (5 mg total) by mouth every 4 (four) hours as needed for severe pain.  15 tablet  0  . pantoprazole (PROTONIX) 40 MG tablet Take 1 tablet (40 mg total) by mouth 2 (two) times daily before a meal.  60 tablet  2  . simvastatin (ZOCOR) 20 MG tablet Take 20 mg by mouth every evening.       . vitamin  B-12 (CYANOCOBALAMIN) 1000 MCG tablet Take 1,000 mcg by mouth daily.      Marland Kitchen zolpidem (AMBIEN) 10 MG tablet Take 10 mg by mouth at bedtime.       No current facility-administered medications on file prior to visit.        Objective:   Physical Exam  Filed Vitals:   09/05/13 1037  BP: 162/68  Pulse: 68  Temp: 97.5 F (36.4 C)  Height: 5\' 4"  (1.626 m)  Weight: 162 lb 8 oz (73.71 kg)   Alert and oriented. Skin warm and dry. Oral mucosa is moist.   . Sclera anicteric, conjunctivae is pink. Thyroid not enlarged. No cervical lymphadenopathy. Lungs clear. Heart regular rate and rhythm.  Abdomen is soft, but distended. Bowel sounds are positive. No hepatomegaly.   No tenderness.  No edema to lower extremities.       Assessment:    Constipation of 12 days duration. Digital exam: Stool burden above finger tip. I discussed this case with DR. Rehman.    Plan:     Moviprep given to patient. I advised not to start till she heard from Dr. Karilyn Cota today. DG colon with water sol. CM.

## 2013-09-09 ENCOUNTER — Encounter (INDEPENDENT_AMBULATORY_CARE_PROVIDER_SITE_OTHER): Payer: Self-pay | Admitting: Internal Medicine

## 2013-09-09 ENCOUNTER — Ambulatory Visit (INDEPENDENT_AMBULATORY_CARE_PROVIDER_SITE_OTHER): Payer: Medicare Other | Admitting: Internal Medicine

## 2013-09-09 VITALS — BP 118/80 | HR 72 | Temp 98.1°F | Resp 16 | Ht 64.0 in | Wt 165.0 lb

## 2013-09-09 DIAGNOSIS — K863 Pseudocyst of pancreas: Secondary | ICD-10-CM

## 2013-09-09 DIAGNOSIS — K862 Cyst of pancreas: Secondary | ICD-10-CM

## 2013-09-09 DIAGNOSIS — K5900 Constipation, unspecified: Secondary | ICD-10-CM

## 2013-09-09 MED ORDER — PANTOPRAZOLE SODIUM 40 MG PO TBEC: 40.0000 mg | DELAYED_RELEASE_TABLET | Freq: Every day | ORAL | Status: DC

## 2013-09-09 MED ORDER — POLYETHYLENE GLYCOL 3350 17 GM/SCOOP PO POWD: 8.5000 g | ORAL | Status: DC

## 2013-09-09 NOTE — Patient Instructions (Signed)
Can go back on meloxicam 7.5 to 15 mg dose daily with food. Discontinue probiotic. Can take polyethylene glycol and every other day or an as-needed basis

## 2013-09-09 NOTE — Progress Notes (Signed)
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  Database;  Patient is 73 year old Caucasian female who presents for scheduled visit. She underwent ERCP on 07/18/2013 for biliary stricture. Prior cytology was negative. Biliary stent was placed. Procedure was complicated by pancreatitis clotting hospitalization and TPN. She is back on diet. She was seen last week for lower abdominal pain and constipation. She did not have bowel movement for 12 days. Enema was obtained with water-soluble contrast to rule out distal obstruction or stricture. Her bowels finally started to move. She did not have to take MoviPrep.   Subjective;  Patient feels much better since her bowels have been moving. She's been having one to 2 bowel movements since last seen 5 days ago. She now complains of lower back pain on the right side and to take pain medication last night. She would like to go back on meloxicam. She denies epigastric pain nausea or vomiting. Her appetite is back to normal. She has had occasional chills but no fever.          Current Medications: Outpatient Encounter Prescriptions as of 09/09/2013  Medication Sig  . amLODipine (NORVASC) 5 MG tablet Take 5 mg by mouth daily.  . ARIPiprazole (ABILIFY) 10 MG tablet Take 10 mg by mouth at bedtime.  Marland Kitchen. aspirin EC 81 MG tablet Take 81 mg by mouth daily at 12 noon.   . calcium carbonate (TUMS - DOSED IN MG ELEMENTAL CALCIUM) 500 MG chewable tablet Chew 1 tablet by mouth 2 (two) times daily.  . Cholecalciferol (VITAMIN D) 400 UNITS capsule Take 400 Units by mouth daily.  Marland Kitchen. dicyclomine (BENTYL) 10 MG capsule Take 1 capsule (10 mg total) by mouth 3 (three) times daily as needed for spasms.  Marland Kitchen. Fish Oil-Cholecalciferol (FISH OIL + D3 PO) Take 1,200 mg by mouth 2 (two) times daily.   . Lactobacillus (PROBIOTIC ACIDOPHILUS PO) Take 1 capsule by mouth daily.   Marland Kitchen. lamoTRIgine (LAMICTAL) 200 MG tablet Take 200 mg by mouth daily.   Marland Kitchen.  losartan (COZAAR) 100 MG tablet Take 100 mg by mouth daily.  . meloxicam (MOBIC) 15 MG tablet Take 15 mg by mouth daily.  . metoprolol tartrate (LOPRESSOR) 25 MG tablet Take 12.5 mg by mouth 2 (two) times daily.   . ondansetron (ZOFRAN) 4 MG tablet Take 1 tablet (4 mg total) by mouth every 8 (eight) hours as needed for nausea or vomiting.  . pantoprazole (PROTONIX) 40 MG tablet Take 1 tablet (40 mg total) by mouth 2 (two) times daily before a meal.  . simvastatin (ZOCOR) 20 MG tablet Take 20 mg by mouth every evening.   . vitamin B-12 (CYANOCOBALAMIN) 1000 MCG tablet Take 1,000 mcg by mouth daily.  Marland Kitchen. zolpidem (AMBIEN) 10 MG tablet Take 10 mg by mouth at bedtime.  . [DISCONTINUED] hydrocortisone cream 1 % Apply topically 2 (two) times daily.  . [DISCONTINUED] oxyCODONE (OXY IR/ROXICODONE) 5 MG immediate release tablet Take 1 tablet (5 mg total) by mouth every 4 (four) hours as needed for severe pain.     Objective: Blood pressure 118/80, pulse 72, temperature 98.1 F (36.7 C), temperature source Oral, resp. rate 16, height 5\' 4"  (1.626 m), weight 165 lb (74.844 kg). Patient is alert and in no acute distress. Conjunctiva is pink. Sclera is nonicteric Oropharyngeal mucosa is normal. No neck masses or thyromegaly noted. Cardiac exam with regular rhythm normal S1 and S2. No murmur or gallop noted. Lungs are clear to auscultation. Abdomen is full. On palpation  abdomen is soft with mild tenderness in epigastrium to left of midline. No organomegaly or masses.  No LE edema or clubbing noted.  Labs/studies Results:   Assessment:  #1. 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 She required parenteral nutrition for a few weeks and now back on oral feeding and doing well. Fullness in the upper abdomen has resolved indicating resolution of pancreatic cyst.  #2. CBD stricture. She had biliary stent placed on 07/18/2013 and stent remains in good position. She will need followup ERCP  for stent removal or exchange depending on findings. #3. Constipation. Finally her bowels are moving.    Plan:  Decrease pantoprazole to 40 mg by mouth every morning. Can go back on meloxicam 15 mg by mouth daily with meal. Polyethylene glycol 8.5 g by mouth every other day. Patient can go back on her usual diet. Office visit in 5 weeks.

## 2013-09-12 ENCOUNTER — Ambulatory Visit (INDEPENDENT_AMBULATORY_CARE_PROVIDER_SITE_OTHER): Payer: Medicare Other | Admitting: Internal Medicine

## 2013-09-26 ENCOUNTER — Encounter (INDEPENDENT_AMBULATORY_CARE_PROVIDER_SITE_OTHER): Payer: Self-pay | Admitting: *Deleted

## 2013-10-07 ENCOUNTER — Ambulatory Visit (INDEPENDENT_AMBULATORY_CARE_PROVIDER_SITE_OTHER): Payer: Medicare Other | Admitting: Internal Medicine

## 2013-10-08 ENCOUNTER — Other Ambulatory Visit (INDEPENDENT_AMBULATORY_CARE_PROVIDER_SITE_OTHER): Payer: Self-pay | Admitting: Internal Medicine

## 2013-10-08 ENCOUNTER — Telehealth (INDEPENDENT_AMBULATORY_CARE_PROVIDER_SITE_OTHER): Payer: Self-pay | Admitting: *Deleted

## 2013-10-08 DIAGNOSIS — Z8719 Personal history of other diseases of the digestive system: Secondary | ICD-10-CM

## 2013-10-08 DIAGNOSIS — Z0189 Encounter for other specified special examinations: Secondary | ICD-10-CM

## 2013-10-08 DIAGNOSIS — K863 Pseudocyst of pancreas: Secondary | ICD-10-CM

## 2013-10-08 NOTE — Telephone Encounter (Signed)
Per Dr.Rehman the patient will need to have labs drawn prior to CT. 

## 2013-10-10 LAB — CREATININE, SERUM: CREATININE: 0.78 mg/dL (ref 0.50–1.10)

## 2013-10-15 ENCOUNTER — Other Ambulatory Visit (HOSPITAL_COMMUNITY): Payer: Medicare Other

## 2013-10-15 ENCOUNTER — Ambulatory Visit (HOSPITAL_COMMUNITY)
Admission: RE | Admit: 2013-10-15 | Discharge: 2013-10-15 | Disposition: A | Discharge status: Home / Self Care | Payer: Medicare Other | Source: Ambulatory Visit | Attending: Internal Medicine | Admitting: Internal Medicine

## 2013-10-15 DIAGNOSIS — Z9889 Other specified postprocedural states: Secondary | ICD-10-CM | POA: Diagnosis not present

## 2013-10-15 DIAGNOSIS — K863 Pseudocyst of pancreas: Secondary | ICD-10-CM

## 2013-10-15 DIAGNOSIS — R112 Nausea with vomiting, unspecified: Secondary | ICD-10-CM | POA: Insufficient documentation

## 2013-10-15 DIAGNOSIS — R109 Unspecified abdominal pain: Secondary | ICD-10-CM | POA: Diagnosis present

## 2013-10-15 DIAGNOSIS — Z8719 Personal history of other diseases of the digestive system: Secondary | ICD-10-CM

## 2013-10-15 MED ORDER — IOHEXOL 300 MG/ML  SOLN
100.0000 mL | Freq: Once | INTRAMUSCULAR | Status: AC | PRN
  Administered 2013-10-15: 100 mL via INTRAVENOUS

## 2013-10-16 ENCOUNTER — Other Ambulatory Visit (HOSPITAL_COMMUNITY): Payer: Medicare Other

## 2013-10-20 ENCOUNTER — Encounter (INDEPENDENT_AMBULATORY_CARE_PROVIDER_SITE_OTHER): Payer: Self-pay | Admitting: *Deleted

## 2013-10-20 ENCOUNTER — Other Ambulatory Visit (INDEPENDENT_AMBULATORY_CARE_PROVIDER_SITE_OTHER): Payer: Self-pay | Admitting: *Deleted

## 2013-10-20 ENCOUNTER — Ambulatory Visit (INDEPENDENT_AMBULATORY_CARE_PROVIDER_SITE_OTHER): Payer: Medicare Other | Admitting: Internal Medicine

## 2013-10-20 ENCOUNTER — Encounter (INDEPENDENT_AMBULATORY_CARE_PROVIDER_SITE_OTHER): Payer: Self-pay | Admitting: Internal Medicine

## 2013-10-20 VITALS — BP 112/72 | HR 76 | Temp 98.6°F | Resp 18 | Ht 64.0 in | Wt 164.3 lb

## 2013-10-20 DIAGNOSIS — R600 Localized edema: Secondary | ICD-10-CM

## 2013-10-20 DIAGNOSIS — K831 Obstruction of bile duct: Secondary | ICD-10-CM

## 2013-10-20 DIAGNOSIS — K863 Pseudocyst of pancreas: Secondary | ICD-10-CM

## 2013-10-20 DIAGNOSIS — R609 Edema, unspecified: Secondary | ICD-10-CM

## 2013-10-20 DIAGNOSIS — K5901 Slow transit constipation: Secondary | ICD-10-CM

## 2013-10-20 DIAGNOSIS — K862 Cyst of pancreas: Secondary | ICD-10-CM

## 2013-10-20 MED ORDER — FUROSEMIDE 40 MG PO TABS: 40.0000 mg | ORAL_TABLET | Freq: Every day | ORAL | Status: DC | PRN

## 2013-10-20 MED ORDER — POTASSIUM CHLORIDE ER 10 MEQ PO TBCR: 10.0000 meq | EXTENDED_RELEASE_TABLET | Freq: Every day | ORAL | Status: DC | PRN

## 2013-10-20 NOTE — Progress Notes (Signed)
Presenting complaint;  Followup for pancreatic pseudocyst and constipation.  Subjective:  Patient is 73 year old Caucasian female presents for scheduled visit. She was last seen on 09/09/2013. 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 She also has been having problems with constipation since she developed pancreatitis. She feels she is slowly getting better. She has sporadic epigastric pain. She got sick 3 days ago when she was eating chicken noodle soup she got sick on her stomach. She took ondansetron and pain medication. She did not experience vomiting fever or chills. Her apetite is normal she is staying on a low-fat diet. She is having a bowel movement every 2-3 days she is not taking polyethylene glycol as recommended. She does not like to take this medication when she is out of the house because she is afraid she may have an accident. She denies melena or rectal bleeding. Her weight is stable. She complains of edema around her ankles. She has furosemide and potassium prescription at home. She has not taken these medications lately. She had abdominopelvic CT last week results of which are reviewed below.   Current Medications: Outpatient Encounter Prescriptions as of 10/20/2013  Medication Sig  . amLODipine (NORVASC) 5 MG tablet Take 5 mg by mouth daily.  . ARIPiprazole (ABILIFY) 10 MG tablet Take 10 mg by mouth at bedtime.  Marland Kitchen aspirin EC 81 MG tablet Take 81 mg by mouth daily at 12 noon.   . calcium carbonate (TUMS - DOSED IN MG ELEMENTAL CALCIUM) 500 MG chewable tablet Chew 1 tablet by mouth 2 (two) times daily.  . Cholecalciferol (VITAMIN D) 400 UNITS capsule Take 400 Units by mouth daily.  Marland Kitchen Fish Oil-Cholecalciferol (FISH OIL + D3 PO) Take 1,200 mg by mouth 2 (two) times daily.   Marland Kitchen lamoTRIgine (LAMICTAL) 200 MG tablet Take 200 mg by mouth daily.   Marland Kitchen losartan (COZAAR) 100 MG tablet Take 100 mg by mouth daily.  . meloxicam (MOBIC) 15 MG tablet  Take 15 mg by mouth daily.  . metoprolol tartrate (LOPRESSOR) 25 MG tablet Take 12.5 mg by mouth 2 (two) times daily.   . ondansetron (ZOFRAN) 4 MG tablet Take 1 tablet (4 mg total) by mouth every 8 (eight) hours as needed for nausea or vomiting.  . pantoprazole (PROTONIX) 40 MG tablet Take 1 tablet (40 mg total) by mouth daily.  . polyethylene glycol powder (MIRALAX) powder Take 8.5 g by mouth every other day.  . simvastatin (ZOCOR) 20 MG tablet Take 20 mg by mouth every evening.   . vitamin B-12 (CYANOCOBALAMIN) 1000 MCG tablet Take 1,000 mcg by mouth daily.  Marland Kitchen zolpidem (AMBIEN) 10 MG tablet Take 10 mg by mouth at bedtime.     Objective: Blood pressure 112/72, pulse 76, temperature 98.6 F (37 C), temperature source Oral, resp. rate 18, height  (1.626 m), weight 164 lb 4.8 oz (74.526 kg). Patient is alert and in no acute distress. Conjunctiva is pink. Sclera is nonicteric Oropharyngeal mucosa is normal. No neck masses or thyromegaly noted. Cardiac exam with regular rhythm normal S1 and S2. No murmur or gallop noted. Lungs are clear to auscultation. Abdomen is full. Bowel sounds are normal. Abdomen is soft with mild tenderness at epigastrium to the right of midline. No mass or organomegaly noted. 1+ pitting edema involving both feet and distal one third of legs.  Labs/studies Results:  Ill-defined fluid collection in the transverse mesocolon has decreased measuring 7.2 x 2.8 cm  Fluid collection anterior to  the pancreas has also decreased vision 5.8 x 2.4 cm  Biliary stent is in place and there is pneumobilia. Wall thickening in the region of proximal sigmoid colon with multiple diverticula. Along with trace amount of fluid adjacent to the proximal sigmoid colon.    Assessment:  #1. Post ERCP pancreatitis complicated by pancreatic pseudocyst and fluid collection in transverse mesocolon. Both of these collections appear to be gradually decreasing in size. #2. CBD stricture.  Biliary stent was placed on 07/18/2013. Biliary cytology was negative. Doubt autoimmune process. #3. Constipation. Recent CT shows thickening to proximal colonic wall and scant amount of fluid around it. She has no tenderness in the hypogastrium more left low quadrant. Scant amount of fluid collection may be related to her episode of pancreatitis. I do not believe she needs an antibiotic. #4. Lower extremity edema. She can use diuretic on an as-needed basis.  Plan:  High fiber diet. Patient encouraged to take polyethylene glycol daily and she can titrate the dose. Furosemide 40 mg by mouth daily when necessary. KCl 20 mEq daily on days she takes furosemide Will schedule ERCP with stent removal or exchange week after next. She will have CBC and comprehensive chemistry panel prior to that procedure

## 2013-10-20 NOTE — Patient Instructions (Addendum)
High fiber diet. Take furosemide 20 to 40 mg on an as-needed basis. Take potassium chloride when you Furosemide. ERCP with stent removal or exchange to be scheduled

## 2013-10-22 ENCOUNTER — Encounter (HOSPITAL_COMMUNITY): Payer: Self-pay | Admitting: Pharmacy Technician

## 2013-10-23 ENCOUNTER — Other Ambulatory Visit (INDEPENDENT_AMBULATORY_CARE_PROVIDER_SITE_OTHER): Payer: Self-pay | Admitting: Internal Medicine

## 2013-10-23 ENCOUNTER — Telehealth (INDEPENDENT_AMBULATORY_CARE_PROVIDER_SITE_OTHER): Payer: Self-pay | Admitting: *Deleted

## 2013-10-23 MED ORDER — OMEPRAZOLE 40 MG PO CPDR: 40.0000 mg | DELAYED_RELEASE_CAPSULE | Freq: Every day | ORAL | Status: DC

## 2013-10-23 NOTE — Telephone Encounter (Signed)
Dr.Rehman may we call in the Omeprazole 20 mg? Please advise.

## 2013-10-23 NOTE — Telephone Encounter (Signed)
Patient wants to know if she still needs to take Pantoprazole 40 mg if so can she have something cheaper as this has a high copay -- please advise

## 2013-10-23 NOTE — Telephone Encounter (Signed)
Prescription for omeprazole send to patient's pharmacy

## 2013-10-23 NOTE — Telephone Encounter (Signed)
Lindsay Palmer called his insurance company and the preferred medicine is Omeprazole 20 mg. If there are any questions, his return phone number is [phone].

## 2013-10-24 NOTE — Telephone Encounter (Signed)
Patient was called and made aware. 

## 2013-10-29 ENCOUNTER — Encounter (INDEPENDENT_AMBULATORY_CARE_PROVIDER_SITE_OTHER): Payer: Self-pay | Admitting: *Deleted

## 2013-11-03 ENCOUNTER — Other Ambulatory Visit (HOSPITAL_COMMUNITY): Payer: Medicare Other

## 2013-11-03 NOTE — Patient Instructions (Signed)
Lindsay Palmer  11/03/2013   Your procedure is scheduled on:  11/07/2013  [facility].  Call this number if you have problems the morning of surgery: [phone]   Remember:   Do not eat food or drink liquids after midnight.   Take these medicines the morning of surgery with A SIP OF WATER:   Amlodipine, Abilify, Lamictal, Cozaar, Mobic, Metoprolol, Zofran, Prilosec   Do not wear jewelry, make-up or nail polish.  Do not wear lotions, powders, or perfumes. You may wear deodorant.  Do not shave 48 hours prior to surgery. Men may shave face and neck.  Do not bring valuables to the hospital.  [facility] any belongings or valuables.               Contacts, dentures or bridgework may not be worn into surgery.  Leave suitcase in the car. After surgery it may be brought to your room.  For patients admitted to the hospital, discharge time is determined by your treatment team.               Patients discharged the day of surgery will not be allowed to drive home.  Name and phone number of your driver:   Special Instructions: N/A   Please read over the following fact sheets that you were given: Anesthesia Post-op Instructions   PATIENT INSTRUCTIONS POST-ANESTHESIA  IMMEDIATELY FOLLOWING SURGERY:  Do not drive or operate machinery for the first twenty four hours after surgery.  Do not make any important decisions for twenty four hours after surgery or while taking narcotic pain medications or sedatives.  If you develop intractable nausea and vomiting or a severe headache please notify your doctor immediately.  FOLLOW-UP:  Please make an appointment with your surgeon as instructed. You do not need to follow up with anesthesia unless specifically instructed to do so.  WOUND CARE INSTRUCTIONS (if applicable):  Keep a dry clean dressing on the anesthesia/puncture wound site if there is drainage.  Once the wound has quit draining you may leave it open to  air.  Generally you should leave the bandage intact for twenty four hours unless there is drainage.  If the epidural site drains for more than 36-48 hours please call the anesthesia department.  QUESTIONS?:  Please feel free to call your physician or the hospital operator if you have any questions, and they will be happy to assist you.      Endoscopic Retrograde Cholangiopancreatography (ERCP) Endoscopic retrograde cholangiopancreatography (ERCP) is a procedure used to diagnosis many diseases of the pancreas, bile ducts, liver, and gallbladder. During ERCP a thin, lighted tube (endoscope) is passed through the mouth and down the back of the throat into the first part of the small intestine (duodenum). A small, plastic tube (cannula) is then passed through the endoscope and directed into the bile duct or pancreatic duct. Dye is then injected through the cannula and X-rays are taken to study the biliary and pancreatic passageways.  LET [facility] KNOW ABOUT:   Any allergies you have.   All medicines you are taking, including vitamins, herbs, eyedrops, creams, and over-the-counter medicines.   Previous problems you or members of your family have had with the use of anesthetics.   Any blood disorders you have.   Previous surgeries you have had.   Medical conditions you have. RISKS AND COMPLICATIONS Generally, ERCP is a safe procedure. However, as with any procedure, complications can occur.  A simple removal of gallstones has the lowest rate of complications. Higher rates of complication occur in people who have poorly functioning bile or pancreatic ducts. Possible complications include:   Pancreatitis.  Bleeding.  Accidental punctures in the bowel wall, pancreas, or gall bladder.  Gall bladder or bile duct infection. BEFORE THE PROCEDURE   Do not eat or drink anything, including water, for at least 8 hours before the procedure or as directed by your health care  provider.   Ask your health care provider whether you should stop taking certain medicines prior to your procedure.   Arrange for someone to drive you home. You will not be allowed to drive for 40-98 hours after the procedure. PROCEDURE   You will be given medicine through a vein (intravenously) to make you relaxed and sleepy.   You might have a breathing tube placed to give you medicine that makes you sleep (general anesthetic).   Your throat may be sprayed with medicine that numbs the area and prevents gagging (local anesthetic), or you may gargle this medicine.   You will lie on your left side.   The endoscope will be inserted through your mouth and into the duodenum. The tube will not interfere with your breathing. Gagging is prevented by the anesthesia.   While X-rays are being taken, you may be positioned on your stomach.   A small sample of tissue (biopsy) may be removed for examination. AFTER THE PROCEDURE   You will rest in bed until you are fully conscious.   When you first wake up, your throat may feel slightly sore.   You will not be allowed to eat or drink until numbness subsides.   Once you are able to drink, urinate, and sit on the edge of the bed without feeling sick to your stomach (nauseous) or dizzy, you may be allowed to go home. Document Released: 10/11/2000 Document Revised: 11/06/2012 Document Reviewed: 08/27/2012 [facility] Patient Information 2015 [place], [place]. This information is not intended to replace advice given to you by your health care provider. Make sure you discuss any questions you have with your health care provider.

## 2013-11-04 ENCOUNTER — Encounter (HOSPITAL_COMMUNITY): Payer: Self-pay

## 2013-11-04 ENCOUNTER — Encounter (HOSPITAL_COMMUNITY)
Admission: RE | Admit: 2013-11-04 | Discharge: 2013-11-04 | Disposition: A | Discharge status: Home / Self Care | Payer: Medicare Other | Source: Ambulatory Visit | Attending: Internal Medicine | Admitting: Internal Medicine

## 2013-11-04 VITALS — BP 146/42 | HR 71 | Temp 97.9°F | Resp 20 | Ht 64.0 in | Wt 164.0 lb

## 2013-11-04 DIAGNOSIS — Z7982 Long term (current) use of aspirin: Secondary | ICD-10-CM | POA: Diagnosis not present

## 2013-11-04 DIAGNOSIS — K863 Pseudocyst of pancreas: Secondary | ICD-10-CM

## 2013-11-04 DIAGNOSIS — Z4589 Encounter for adjustment and management of other implanted devices: Secondary | ICD-10-CM | POA: Diagnosis not present

## 2013-11-04 DIAGNOSIS — E780 Pure hypercholesterolemia: Secondary | ICD-10-CM | POA: Diagnosis not present

## 2013-11-04 DIAGNOSIS — F319 Bipolar disorder, unspecified: Secondary | ICD-10-CM | POA: Diagnosis not present

## 2013-11-04 DIAGNOSIS — Z79899 Other long term (current) drug therapy: Secondary | ICD-10-CM | POA: Diagnosis not present

## 2013-11-04 DIAGNOSIS — K831 Obstruction of bile duct: Secondary | ICD-10-CM | POA: Diagnosis not present

## 2013-11-04 DIAGNOSIS — I10 Essential (primary) hypertension: Secondary | ICD-10-CM | POA: Diagnosis not present

## 2013-11-04 DIAGNOSIS — K219 Gastro-esophageal reflux disease without esophagitis: Secondary | ICD-10-CM | POA: Diagnosis not present

## 2013-11-04 LAB — COMPREHENSIVE METABOLIC PANEL
ALBUMIN: 3.7 g/dL (ref 3.5–5.2)
ALT: 20 U/L (ref 0–35)
AST: 23 U/L (ref 0–37)
Alkaline Phosphatase: 131 U/L — ABNORMAL HIGH (ref 39–117)
Anion gap: 13 (ref 5–15)
BILIRUBIN TOTAL: 0.3 mg/dL (ref 0.3–1.2)
BUN: 20 mg/dL (ref 6–23)
CO2: 28 mEq/L (ref 19–32)
Calcium: 9.6 mg/dL (ref 8.4–10.5)
Chloride: 103 mEq/L (ref 96–112)
Creatinine, Ser: 0.84 mg/dL (ref 0.50–1.10)
GFR calc Af Amer: 79 mL/min — ABNORMAL LOW (ref >90)
GFR, EST NON AFRICAN AMERICAN: 68 mL/min — AB (ref >90)
Glucose, Bld: 117 mg/dL — ABNORMAL HIGH (ref 70–99)
Potassium: 4.2 mEq/L (ref 3.7–5.3)
Sodium: 144 mEq/L (ref 137–147)
Total Protein: 7.4 g/dL (ref 6.0–8.3)

## 2013-11-04 LAB — CBC WITH DIFFERENTIAL/PLATELET
Basophils Absolute: 0 10*3/uL (ref 0.0–0.1)
Basophils Relative: 0 % (ref 0–1)
Eosinophils Absolute: 0.2 10*3/uL (ref 0.0–0.7)
Eosinophils Relative: 3 % (ref 0–5)
HCT: 37.4 % (ref 36.0–46.0)
Hemoglobin: 12.4 g/dL (ref 12.0–15.0)
Lymphocytes Relative: 25 % (ref 12–46)
Lymphs Abs: 1.6 10*3/uL (ref 0.7–4.0)
MCH: 29.8 pg (ref 26.0–34.0)
MCHC: 33.2 g/dL (ref 30.0–36.0)
MCV: 89.9 fL (ref 78.0–100.0)
Monocytes Absolute: 0.4 10*3/uL (ref 0.1–1.0)
Monocytes Relative: 6 % (ref 3–12)
Neutro Abs: 4.1 10*3/uL (ref 1.7–7.7)
Neutrophils Relative %: 66 % (ref 43–77)
Platelets: 230 10*3/uL (ref 150–400)
RBC: 4.16 MIL/uL (ref 3.87–5.11)
RDW: 13.7 % (ref 11.5–15.5)
WBC: 6.3 10*3/uL (ref 4.0–10.5)

## 2013-11-07 ENCOUNTER — Ambulatory Visit (HOSPITAL_COMMUNITY): Payer: Medicare Other | Admitting: Anesthesiology

## 2013-11-07 ENCOUNTER — Encounter (HOSPITAL_COMMUNITY)
Admission: RE | Disposition: A | Discharge status: Home / Self Care | Payer: Self-pay | Source: Ambulatory Visit | Attending: Internal Medicine

## 2013-11-07 ENCOUNTER — Ambulatory Visit (HOSPITAL_COMMUNITY)
Admission: RE | Admit: 2013-11-07 | Discharge: 2013-11-07 | Disposition: A | Discharge status: Home / Self Care | Payer: Medicare Other | Source: Ambulatory Visit | Attending: Internal Medicine | Admitting: Internal Medicine

## 2013-11-07 ENCOUNTER — Encounter (HOSPITAL_COMMUNITY): Payer: Medicare Other | Admitting: Anesthesiology

## 2013-11-07 ENCOUNTER — Ambulatory Visit (HOSPITAL_COMMUNITY): Payer: Medicare Other

## 2013-11-07 ENCOUNTER — Encounter (HOSPITAL_COMMUNITY): Payer: Self-pay | Admitting: *Deleted

## 2013-11-07 DIAGNOSIS — K219 Gastro-esophageal reflux disease without esophagitis: Secondary | ICD-10-CM | POA: Insufficient documentation

## 2013-11-07 DIAGNOSIS — Z8719 Personal history of other diseases of the digestive system: Secondary | ICD-10-CM

## 2013-11-07 DIAGNOSIS — K863 Pseudocyst of pancreas: Secondary | ICD-10-CM

## 2013-11-07 DIAGNOSIS — I10 Essential (primary) hypertension: Secondary | ICD-10-CM | POA: Insufficient documentation

## 2013-11-07 DIAGNOSIS — T85590D Other mechanical complication of bile duct prosthesis, subsequent encounter: Secondary | ICD-10-CM

## 2013-11-07 DIAGNOSIS — Z9889 Other specified postprocedural states: Secondary | ICD-10-CM

## 2013-11-07 DIAGNOSIS — Z7982 Long term (current) use of aspirin: Secondary | ICD-10-CM | POA: Insufficient documentation

## 2013-11-07 DIAGNOSIS — F319 Bipolar disorder, unspecified: Secondary | ICD-10-CM | POA: Insufficient documentation

## 2013-11-07 DIAGNOSIS — E780 Pure hypercholesterolemia: Secondary | ICD-10-CM | POA: Insufficient documentation

## 2013-11-07 DIAGNOSIS — K820 Obstruction of gallbladder: Secondary | ICD-10-CM

## 2013-11-07 DIAGNOSIS — Z4589 Encounter for adjustment and management of other implanted devices: Secondary | ICD-10-CM | POA: Insufficient documentation

## 2013-11-07 DIAGNOSIS — K831 Obstruction of bile duct: Secondary | ICD-10-CM | POA: Diagnosis not present

## 2013-11-07 DIAGNOSIS — Z79899 Other long term (current) drug therapy: Secondary | ICD-10-CM | POA: Insufficient documentation

## 2013-11-07 HISTORY — PX: ERCP: SHX5425

## 2013-11-07 HISTORY — PX: STENT REMOVAL: SHX6421

## 2013-11-07 HISTORY — PX: SPHINCTEROTOMY: SHX5544

## 2013-11-07 SURGERY — ERCP, WITH INTERVENTION IF INDICATED
Anesthesia: General

## 2013-11-07 MED ORDER — ONDANSETRON HCL 4 MG/2ML IJ SOLN
4.0000 mg | Freq: Once | INTRAMUSCULAR | Status: AC
  Administered 2013-11-07: 4 mg via INTRAVENOUS

## 2013-11-07 MED ORDER — MIDAZOLAM HCL 2 MG/2ML IJ SOLN
1.0000 mg | INTRAMUSCULAR | Status: DC | PRN
  Administered 2013-11-07: 2 mg via INTRAVENOUS

## 2013-11-07 MED ORDER — GLYCOPYRROLATE 0.2 MG/ML IJ SOLN
0.2000 mg | Freq: Once | INTRAMUSCULAR | Status: AC
  Administered 2013-11-07: 0.2 mg via INTRAVENOUS

## 2013-11-07 MED ORDER — CEFAZOLIN SODIUM-DEXTROSE 2-3 GM-% IV SOLR
INTRAVENOUS | Status: AC
  Filled 2013-11-07: qty 50

## 2013-11-07 MED ORDER — GLUCAGON HCL RDNA (DIAGNOSTIC) 1 MG IJ SOLR
INTRAMUSCULAR | Status: DC | PRN
  Administered 2013-11-07: 0.25 mg via INTRAVENOUS

## 2013-11-07 MED ORDER — SODIUM CHLORIDE 0.9 % IJ SOLN
INTRAMUSCULAR | Status: AC
  Filled 2013-11-07: qty 30

## 2013-11-07 MED ORDER — PHENYLEPHRINE HCL 10 MG/ML IJ SOLN
INTRAMUSCULAR | Status: AC
  Filled 2013-11-07: qty 1

## 2013-11-07 MED ORDER — IOHEXOL 350 MG/ML SOLN
INTRAVENOUS | Status: DC | PRN
  Administered 2013-11-07: 08:00:00

## 2013-11-07 MED ORDER — MIDAZOLAM HCL 2 MG/2ML IJ SOLN
INTRAMUSCULAR | Status: AC
  Filled 2013-11-07: qty 2

## 2013-11-07 MED ORDER — GLUCAGON HCL RDNA (DIAGNOSTIC) 1 MG IJ SOLR
INTRAMUSCULAR | Status: AC
  Filled 2013-11-07: qty 2

## 2013-11-07 MED ORDER — FENTANYL CITRATE 0.05 MG/ML IJ SOLN
INTRAMUSCULAR | Status: AC
  Filled 2013-11-07: qty 2

## 2013-11-07 MED ORDER — SODIUM CHLORIDE 0.9 % IV SOLN
INTRAVENOUS | Status: AC
  Filled 2013-11-07: qty 100

## 2013-11-07 MED ORDER — EPHEDRINE SULFATE 50 MG/ML IJ SOLN
INTRAMUSCULAR | Status: AC
  Filled 2013-11-07: qty 1

## 2013-11-07 MED ORDER — ROCURONIUM BROMIDE 50 MG/5ML IV SOLN
INTRAVENOUS | Status: AC
  Filled 2013-11-07: qty 1

## 2013-11-07 MED ORDER — FENTANYL CITRATE 0.05 MG/ML IJ SOLN: 25.0000 ug | INTRAMUSCULAR | Status: DC | PRN

## 2013-11-07 MED ORDER — ONDANSETRON HCL 4 MG/2ML IJ SOLN
INTRAMUSCULAR | Status: AC
  Filled 2013-11-07: qty 2

## 2013-11-07 MED ORDER — PHENYLEPHRINE HCL 10 MG/ML IJ SOLN
INTRAMUSCULAR | Status: DC | PRN
  Administered 2013-11-07: 100 ug via INTRAVENOUS

## 2013-11-07 MED ORDER — FENTANYL CITRATE 0.05 MG/ML IJ SOLN
INTRAMUSCULAR | Status: DC | PRN
  Administered 2013-11-07 (×2): 50 ug via INTRAVENOUS

## 2013-11-07 MED ORDER — LACTATED RINGERS IV SOLN
INTRAVENOUS | Status: DC | PRN
  Administered 2013-11-07 (×2): via INTRAVENOUS

## 2013-11-07 MED ORDER — SIMETHICONE 40 MG/0.6ML PO SUSP
ORAL | Status: AC
  Filled 2013-11-07: qty 0.6

## 2013-11-07 MED ORDER — PROPOFOL 10 MG/ML IV BOLUS
INTRAVENOUS | Status: DC | PRN
  Administered 2013-11-07: 100 mg via INTRAVENOUS
  Administered 2013-11-07: 20 mg via INTRAVENOUS

## 2013-11-07 MED ORDER — ONDANSETRON HCL 4 MG/2ML IJ SOLN: 4.0000 mg | Freq: Once | INTRAMUSCULAR | Status: AC | PRN

## 2013-11-07 MED ORDER — SUCCINYLCHOLINE CHLORIDE 20 MG/ML IJ SOLN
INTRAMUSCULAR | Status: DC | PRN
  Administered 2013-11-07: 100 mg via INTRAVENOUS

## 2013-11-07 MED ORDER — ARTIFICIAL TEARS OP OINT
TOPICAL_OINTMENT | OPHTHALMIC | Status: AC
  Filled 2013-11-07: qty 3.5

## 2013-11-07 MED ORDER — LIDOCAINE HCL (PF) 1 % IJ SOLN
INTRAMUSCULAR | Status: AC
  Filled 2013-11-07: qty 5

## 2013-11-07 MED ORDER — SUCCINYLCHOLINE CHLORIDE 20 MG/ML IJ SOLN
INTRAMUSCULAR | Status: AC
  Filled 2013-11-07: qty 1

## 2013-11-07 MED ORDER — GLYCOPYRROLATE 0.2 MG/ML IJ SOLN
INTRAMUSCULAR | Status: AC
  Filled 2013-11-07: qty 1

## 2013-11-07 MED ORDER — EPHEDRINE SULFATE 50 MG/ML IJ SOLN
INTRAMUSCULAR | Status: DC | PRN
  Administered 2013-11-07: 5 mg via INTRAVENOUS

## 2013-11-07 MED ORDER — LACTATED RINGERS IV SOLN
INTRAVENOUS | Status: DC
  Administered 2013-11-07: 08:00:00 via INTRAVENOUS

## 2013-11-07 MED ORDER — PROPOFOL 10 MG/ML IV BOLUS
INTRAVENOUS | Status: AC
  Filled 2013-11-07: qty 20

## 2013-11-07 MED ORDER — STERILE WATER FOR IRRIGATION IR SOLN
Status: DC | PRN
  Administered 2013-11-07: 08:00:00

## 2013-11-07 MED ORDER — LIDOCAINE HCL (CARDIAC) 20 MG/ML IV SOLN
INTRAVENOUS | Status: DC | PRN
  Administered 2013-11-07: 50 mg via INTRAVENOUS

## 2013-11-07 MED ORDER — CEFAZOLIN SODIUM-DEXTROSE 2-3 GM-% IV SOLR
2.0000 g | INTRAVENOUS | Status: AC
  Administered 2013-11-07: 2 g via INTRAVENOUS

## 2013-11-07 SURGICAL SUPPLY — 14 items
BAG HAMPER (MISCELLANEOUS) ×3 IMPLANT
BALLN RETRIEVAL 12X15 (BALLOONS) ×1 IMPLANT
BALN RTRVL 200 6-7FR 12-15 (BALLOONS) ×2
DEVICE LOCKING W-BIOPSY CAP (MISCELLANEOUS) ×3 IMPLANT
KIT CLEAN ENDO COMPLIANCE (KITS) ×3 IMPLANT
KIT ROOM TURNOVER APOR (KITS) ×3 IMPLANT
LUBRICANT JELLY 4.5OZ STERILE (MISCELLANEOUS) ×1 IMPLANT
PAD ARMBOARD 7.5X6 YLW CONV (MISCELLANEOUS) ×3 IMPLANT
SCOPE SPY DS DISPOSABLE (MISCELLANEOUS) ×3 IMPLANT
SNARE ROTATE MED OVAL 20MM (MISCELLANEOUS) ×1 IMPLANT
SPHINCTEROTOME HYDRATOME 44 (MISCELLANEOUS) ×3 IMPLANT
SYR 50ML LL SCALE MARK (SYRINGE) ×1 IMPLANT
SYSTEM CONTINUOUS INJECTION (MISCELLANEOUS) ×3 IMPLANT
WATER STERILE IRR 1000ML POUR (IV SOLUTION) ×3 IMPLANT

## 2013-11-07 NOTE — Anesthesia Preprocedure Evaluation (Signed)
Anesthesia Evaluation  Patient identified by MRN, date of birth, ID band Patient awake    Reviewed: Allergy & Precautions, H&P , NPO status , Patient's Chart, lab work & pertinent test results  Airway Mallampati: III TM Distance: <3 FB Neck ROM: Full  Mouth opening: Limited Mouth Opening  Dental  (+) Teeth Intact   Pulmonary sleep apnea ,  breath sounds clear to auscultation        Cardiovascular hypertension, Pt. on medications Rhythm:Regular Rate:Normal     Neuro/Psych  Headaches, PSYCHIATRIC DISORDERS Depression Bipolar Disorder    GI/Hepatic GERD-  Medicated and Controlled,  Endo/Other  goiter  Renal/GU      Musculoskeletal   Abdominal   Peds  Hematology   Anesthesia Other Findings   Reproductive/Obstetrics                          Anesthesia Physical Anesthesia Plan  ASA: III  Anesthesia Plan:    Post-op Pain Management:    Induction: Intravenous  Airway Management Planned: Oral ETT and Video Laryngoscope Planned  Additional Equipment:   Intra-op Plan:   Post-operative Plan: Extubation in OR  Informed Consent: I have reviewed the patients History and Physical, chart, labs and discussed the procedure including the risks, benefits and alternatives for the proposed anesthesia with the patient or authorized representative who has indicated his/her understanding and acceptance.     Plan Discussed with:   Anesthesia Plan Comments:         Anesthesia Quick Evaluation  

## 2013-11-07 NOTE — Progress Notes (Signed)
Patient kept until she voided per dr Karilyn Cotarehman due to last time patient had urinary retention after surgery/endo. Patient did void 150cc

## 2013-11-07 NOTE — Anesthesia Postprocedure Evaluation (Signed)
  Anesthesia Post-op Note  Patient: Lindsay Palmer  Procedure(s) Performed: Procedure(s): ENDOSCOPIC RETROGRADE CHOLANGIOPANCREATOGRAPHY (ERCP) (N/A) STENT REMOVAL (N/A) EXTENSION OF SPHINCTEROTOMY  Patient Location: PACU  Anesthesia Type:General  Level of Consciousness: awake, sedated and patient cooperative  Airway and Oxygen Therapy: Patient Spontanous Breathing and Patient connected to face mask oxygen  Post-op Pain: none  Post-op Assessment: Post-op Vital signs reviewed, Patient's Cardiovascular Status Stable, Respiratory Function Stable, Patent Airway and No signs of Nausea or vomiting  Post-op Vital Signs: Reviewed and stable  Last Vitals:  Filed Vitals:   11/07/13 0740  BP: 143/54  Pulse:   Temp:   Resp: 40    Complications: No apparent anesthesia complications

## 2013-11-07 NOTE — H&P (Signed)
Lindsay LinerHelen Palmer is an 73 y.o. female.   Chief Complaint: Patient is here for ERCP and stent removal and or exchange. HPI: Patient is  73 year old Caucasian female who presented back about 4 months ago with a return of symptoms. She was worked up by Dr. Halina MaidensJack Spainhour of tendon which in and found to have distal CBD stricture. She underwent ERCP with stenting on 07/18/2013. Procedure was complicated by post-ERCP pancreatitis with pseudocyst formation requiring hospitalization and parenteral nutrition but no further intervention. She is to return for repeat ERCP with stent removal unless stricture felt to be very tight. She denies upper abdominal pain. She still has intermittent spells of nausea. She has had 3 in the last 3 weeks the spells are not associated with vomiting fever or pain. Her appetite is normal. She is taking polyethylene glycol daily unless she has to leave home. Her bowels move every other day. She denies lower abdominal pain. Resting from bile duct revealed a reactive epithelial changes but no dysplasia. Patient's lab studies from 11/04/2013 reviewed. CBC is normal. LFTs are normal except alkaline phosphatase of 131 which is better than it was before. Bilirubin and transaminases are normal. Glucose 117 but this was not a fasting specimen.  Past Medical History  Diagnosis Date  . GERD (gastroesophageal reflux disease)   . Bipolar 2 disorder   . Major depressive disorder   . Chronic insomnia   . Non-toxic multinodular goiter   . Osteopenia   . Vitamin D deficiency   . Migraine   . Elevated LFTs   . Diverticulosis of colon   . H/O first degree atrioventricular block   . Fatty liver disease, nonalcoholic   . Hypertension   . Hypercholesteremia   . Sleep apnea     Stop Bang score of 4. Pt said she had sleep apnea at one time, but she has had another sleep study since then and they said she does not have it anymore.  . Pancreatitis     Past Surgical History  Procedure Laterality  Date  . Neck surgery      Stenosis C6-C7  . Cholecystectomy    . Cataract surgery Bilateral 08-2012  . Colonoscopy  2003    Diverticulitis h/o polyps -due next 2015 -gastroscopy 2003  . Sigmoidoscopy  2007    no polyps  . Ercp    . Ercp N/A 07/18/2013    Procedure: ENDOSCOPIC RETROGRADE CHOLANGIOPANCREATOGRAPHY (ERCP) COMMON BILE DUCT BRUSHING;  Surgeon: Malissa HippoNajeeb U Shellye Zandi, MD;  Location: [facility];  Service: Endoscopy;  Laterality: N/A;  . Sphincterotomy N/A 07/18/2013    Procedure: SPHINCTEROTOMY;  Surgeon: Malissa HippoNajeeb U Brigida Scotti, MD;  Location: [facility];  Service: Endoscopy;  Laterality: N/A;  . Biliary stent placement N/A 07/18/2013    Procedure: BILIARY STENT PLACEMENT;  Surgeon: Malissa HippoNajeeb U Raymonda Pell, MD;  Location: [facility];  Service: Endoscopy;  Laterality: N/A;  . Eye surgery Bilateral     cataract removal    Family History  Problem Relation Age of Onset  . Diabetes Mother   . Coronary artery disease Father   . Gout Father   . Breast cancer Sister   . Pancreatic cancer Brother   . Healthy Brother   . Lung cancer Sister   . Healthy Sister   . Hyperlipidemia Daughter   . Hyperlipidemia Son   . Healthy Sister   . Healthy Sister    Social History:  reports that she has never smoked. She has never used smokeless tobacco. She reports that she  does not drink alcohol or use illicit drugs.  Allergies:  Allergies  Allergen Reactions  . Codeine Nausea And Vomiting    Medications Prior to Admission  Medication Sig Dispense Refill  . amLODipine (NORVASC) 5 MG tablet Take 5 mg by mouth daily.      . ARIPiprazole (ABILIFY) 10 MG tablet Take 10 mg by mouth at bedtime.      Marland Kitchen. aspirin EC 81 MG tablet Take 81 mg by mouth daily at 12 noon.       . calcium carbonate (TUMS - DOSED IN MG ELEMENTAL CALCIUM) 500 MG chewable tablet Chew 1 tablet by mouth 2 (two) times daily.      . Cholecalciferol (VITAMIN D) 400 UNITS capsule Take 400 Units by mouth daily.      Marland Kitchen. Fish Oil-Cholecalciferol (FISH OIL + D3 PO)  Take 1,200 mg by mouth 2 (two) times daily.       . furosemide (LASIX) 40 MG tablet Take 1 tablet (40 mg total) by mouth daily as needed.  30 tablet  0  . lamoTRIgine (LAMICTAL) 200 MG tablet Take 200 mg by mouth daily.       Marland Kitchen. losartan (COZAAR) 100 MG tablet Take 100 mg by mouth daily.      . meloxicam (MOBIC) 15 MG tablet Take 15 mg by mouth daily.      . metoprolol tartrate (LOPRESSOR) 25 MG tablet Take 12.5 mg by mouth 2 (two) times daily.       Marland Kitchen. omeprazole (PRILOSEC) 40 MG capsule Take 1 capsule (40 mg total) by mouth daily.  90 capsule  3  . ondansetron (ZOFRAN) 4 MG tablet Take 1 tablet (4 mg total) by mouth every 8 (eight) hours as needed for nausea or vomiting.  30 tablet  1  . polyethylene glycol powder (MIRALAX) powder Take 8.5 g by mouth every other day.  527 g  3  . potassium chloride (K-DUR) 10 MEQ tablet Take 1 tablet (10 mEq total) by mouth daily as needed.  30 tablet  0  . simvastatin (ZOCOR) 20 MG tablet Take 20 mg by mouth every evening.       . vitamin B-12 (CYANOCOBALAMIN) 1000 MCG tablet Take 1,000 mcg by mouth daily.      Marland Kitchen. zolpidem (AMBIEN) 10 MG tablet Take 10 mg by mouth at bedtime.        No results found for this or any previous visit (from the past 48 hour(s)). No results found.  ROS  Blood pressure 155/57, pulse 73, temperature 98.5 F (36.9 C), temperature source Oral, resp. rate 46, SpO2 95.00%. Physical Exam  Constitutional: She appears well-developed and well-nourished.  HENT:  Mouth/Throat: Oropharynx is clear and moist.  Eyes: Conjunctivae are normal. No scleral icterus.  Neck: No thyromegaly present.  Cardiovascular: Normal rate, regular rhythm and normal heart sounds.   No murmur heard. Respiratory: Effort normal and breath sounds normal.  GI: Soft. She exhibits no distension and no mass. There is no tenderness.  Musculoskeletal: She exhibits no edema.  Lymphadenopathy:    She has no cervical adenopathy.  Neurological: She is alert.  Skin:  Skin is warm and dry.     Assessment/Plan  History of distal CBD stricture of benign etiology. Status post restenting on 07/18/2013. ERCP with stent removal or exchange.  Lindsay Palmer U 11/07/2013, 7:20 AM

## 2013-11-07 NOTE — Transfer of Care (Signed)
Immediate Anesthesia Transfer of Care Note  Patient: Romero LinerHelen Arons  Procedure(s) Performed: Procedure(s): ENDOSCOPIC RETROGRADE CHOLANGIOPANCREATOGRAPHY (ERCP) (N/A) STENT REMOVAL (N/A) EXTENSION OF SPHINCTEROTOMY  Patient Location: PACU  Anesthesia Type:General  Level of Consciousness: awake, sedated and patient cooperative  Airway & Oxygen Therapy: Patient Spontanous Breathing and Patient connected to face mask oxygen  Post-op Assessment: Report given to PACU RN and Post -op Vital signs reviewed and stable  Post vital signs: Reviewed and stable  Complications: No apparent anesthesia complications

## 2013-11-07 NOTE — Progress Notes (Signed)
Arouses easily to name. Oriented to place per nurse. Denies pain. Swallowing without difficulty.

## 2013-11-07 NOTE — Discharge Instructions (Signed)
No aspirin or NSAIDs for 3 days. Resume other medications as before. Clear liquids today. Resume usual diet starting in a.m. Office visit in 4 weeks.

## 2013-11-07 NOTE — Op Note (Signed)
[facility] [address] [place], [id number]   ERCP PROCEDURE REPORT        EXAM DATE: 11/07/2013  PATIENT NAME:          Lindsay Palmer, Lindsay Palmer          MR #:        [id number]  BIRTHDATE:       December 17, 1940     VISIT #:     [id number] ATTENDING:     Lionel DecemberNajeeb Rehman, MD     STATUS:     outpatient ASSISTANT:  INDICATIONS:  The patient is a 73 yr old fCaucasian female with history of distal CBD stricture who underwent ERCP with stenting on 07/18/2013.  She developed post ERCP pancreatitis with pseudocyst. She is returning for stent removal and/or exchange.Marland Kitchen. PROCEDURE PERFORMED:     ERCP, diagnostic stent removal and extension of sphincterotomy MEDICATIONS:     Per Anesthesia and Gen.  endotracheal anesthesia   CONSENT: The patient understands the risks and benefits of the procedure and understands that these risks include, but are not limited to: sedation, allergic reaction, infection, perforation and/or bleeding. Alternative means of evaluation and treatment include, among others: physical exam, x-rays, and/or surgical intervention. The patient elects to proceed with this endoscopic procedure.  DESCRIPTION OF PROCEDURE: During intra-op preparation period all mechanical & medical equipment was checked for proper function. Hand hygiene and appropriate measures for infection prevention was taken. After the risks, benefits and alternatives of the procedure were thoroughly explained, Informed was verified, confirmed and timeout was successfully executed by the treatment team. With the patient in left semi-prone position, medications were administered intravenously.The Pentax Ercp Scope [id number] was passed from the mouth into the esophagus and further advanced from the esophagus into the stomach. From stomach scope was directed to the   .  Major papilla was aligned with the duodenoscope. The scope position was confirmed fluoroscopically. Rest of the findings/therapeutics  are given below. The scope was then completely withdrawn from the patient and the procedure completed. The pulse, BP, and O2 saturation were monitored and documented by the physician and the nursing staff throughout the entire procedure. The patient was cared for as planned according to standard protocol. The patient was then discharged to recovery in stable condition and with appropriate post procedure care.  Biliary stent in place.  It was nearly occluded with debris. Stent caught with polypectomy and removed under fluoroscopic control.   There was a dilation of the common hepatic duct and CBD. A stricture was noted in the distal common bile duct. Sphincterotomy was extended. 10 mm balloon passed across CBD and stricture and across ampulla without much resistance. Free flow of contrast noted into the duodenum.   Pancreatic duct was not cannulated or filled with contrast.    ADVERSE EVENT:     There were no complications. IMPRESSIONS:     1. Plastic biliary stent removed. 2. Mildly dilated CHD and CBD  with noncritical distal CBD stricture.   10 mm balloon passed across it.  drainage appeared to be satisfactory. Therefore CBD not restented. 3.Biliary sphincterotomy was extended.  RECOMMENDATIONS:     Portable film in PACU. Clear liquids today. No aspirin or NSAIDs for 3 days. Office visit in 4 weeks. REPEAT EXAM:   ___________________________________ Lionel DecemberNajeeb Rehman, MD eSigned:  Lionel DecemberNajeeb Rehman, MD 11/07/2013 9:05 AM   cc:  CPT CODES: ICD9 CODES:  The ICD and CPT codes recommended by this software are interpretations from the data that the  clinical staff has captured with the software.  The verification of the translation of this report to the ICD and CPT codes and modifiers is the sole responsibility of the health care institution and practicing physician where this report was generated.  [employer] will not be held responsible for the validity of  the ICD and CPT codes included on this report.  AMA assumes no liability for data contained or not contained herein. CPT is a Publishing rights managerregistered trademark of the [employer].   PATIENT NAME:  Lindsay Palmer, Lindsay Palmer MR#: [id number]

## 2013-11-07 NOTE — Anesthesia Procedure Notes (Signed)
Procedure Name: Intubation Date/Time: 11/07/2013 7:52 AM Performed by: Pernell DupreADAMS, AMY A Pre-anesthesia Checklist: Timeout performed, Patient identified, Emergency Drugs available, Suction available and Patient being monitored Patient Re-evaluated:Patient Re-evaluated prior to inductionOxygen Delivery Method: Circle system utilized Preoxygenation: Pre-oxygenation with 100% oxygen Intubation Type: IV induction, Rapid sequence and Cricoid Pressure applied Grade View: Grade I Number of attempts: 1 Airway Equipment and Method: Video-laryngoscopy Placement Confirmation: ETT inserted through vocal cords under direct vision,  positive ETCO2 and breath sounds checked- equal and bilateral Secured at: 20 cm Tube secured with: Tape Dental Injury: Teeth and Oropharynx as per pre-operative assessment

## 2013-11-10 ENCOUNTER — Encounter (HOSPITAL_COMMUNITY): Payer: Self-pay | Admitting: Internal Medicine

## 2013-11-21 ENCOUNTER — Encounter (INDEPENDENT_AMBULATORY_CARE_PROVIDER_SITE_OTHER): Payer: Self-pay | Admitting: *Deleted

## 2013-12-08 ENCOUNTER — Ambulatory Visit (INDEPENDENT_AMBULATORY_CARE_PROVIDER_SITE_OTHER): Payer: Medicare Other | Admitting: Internal Medicine

## 2014-01-01 ENCOUNTER — Encounter (INDEPENDENT_AMBULATORY_CARE_PROVIDER_SITE_OTHER): Payer: Self-pay | Admitting: *Deleted

## 2014-01-12 ENCOUNTER — Ambulatory Visit (HOSPITAL_COMMUNITY)
Admission: RE | Admit: 2014-01-12 | Discharge: 2014-01-12 | Disposition: A | Discharge status: Home / Self Care | Payer: Medicare Other | Source: Ambulatory Visit | Attending: Internal Medicine | Admitting: Internal Medicine

## 2014-01-12 ENCOUNTER — Other Ambulatory Visit (INDEPENDENT_AMBULATORY_CARE_PROVIDER_SITE_OTHER): Payer: Self-pay | Admitting: Internal Medicine

## 2014-01-12 ENCOUNTER — Telehealth (INDEPENDENT_AMBULATORY_CARE_PROVIDER_SITE_OTHER): Payer: Self-pay | Admitting: *Deleted

## 2014-01-12 DIAGNOSIS — K5730 Diverticulosis of large intestine without perforation or abscess without bleeding: Secondary | ICD-10-CM | POA: Insufficient documentation

## 2014-01-12 DIAGNOSIS — M5136 Other intervertebral disc degeneration, lumbar region: Secondary | ICD-10-CM | POA: Insufficient documentation

## 2014-01-12 DIAGNOSIS — R531 Weakness: Secondary | ICD-10-CM

## 2014-01-12 DIAGNOSIS — K859 Acute pancreatitis, unspecified: Secondary | ICD-10-CM

## 2014-01-12 DIAGNOSIS — Z9049 Acquired absence of other specified parts of digestive tract: Secondary | ICD-10-CM | POA: Diagnosis not present

## 2014-01-12 DIAGNOSIS — Z7689 Persons encountering health services in other specified circumstances: Secondary | ICD-10-CM

## 2014-01-12 DIAGNOSIS — K863 Pseudocyst of pancreas: Secondary | ICD-10-CM | POA: Insufficient documentation

## 2014-01-12 DIAGNOSIS — R109 Unspecified abdominal pain: Secondary | ICD-10-CM | POA: Diagnosis present

## 2014-01-12 DIAGNOSIS — R1012 Left upper quadrant pain: Secondary | ICD-10-CM

## 2014-01-12 DIAGNOSIS — I700 Atherosclerosis of aorta: Secondary | ICD-10-CM | POA: Insufficient documentation

## 2014-01-12 LAB — CBC
HEMATOCRIT: 37.3 % (ref 36.0–46.0)
HEMOGLOBIN: 12.2 g/dL (ref 12.0–15.0)
MCH: 29.1 pg (ref 26.0–34.0)
MCHC: 32.7 g/dL (ref 30.0–36.0)
MCV: 89 fL (ref 78.0–100.0)
MPV: 11.3 fL (ref 9.4–12.4)
Platelets: 239 10*3/uL (ref 150–400)
RBC: 4.19 MIL/uL (ref 3.87–5.11)
RDW: 14.3 % (ref 11.5–15.5)
WBC: 6.8 10*3/uL (ref 4.0–10.5)

## 2014-01-12 LAB — LIPASE: Lipase: 14 U/L (ref 11–59)

## 2014-01-12 LAB — AMYLASE: AMYLASE: 57 U/L (ref 0–105)

## 2014-01-12 LAB — HEPATIC FUNCTION PANEL
ALT: 25 U/L (ref 0–35)
AST: 25 U/L (ref 0–37)
Albumin: 3.6 g/dL (ref 3.5–5.2)
Alkaline Phosphatase: 169 U/L — ABNORMAL HIGH (ref 39–117)
Bilirubin, Direct: 0.1 mg/dL (ref 0.0–0.3)
Indirect Bilirubin: 0.3 mg/dL (ref 0.2–1.2)
Total Bilirubin: 0.4 mg/dL (ref 0.3–1.2)
Total Protein: 7.1 g/dL (ref 6.0–8.3)

## 2014-01-12 LAB — CREATININE, SERUM: CREATININE: 0.82 mg/dL (ref 0.50–1.10)

## 2014-01-12 MED ORDER — IOHEXOL 300 MG/ML  SOLN
100.0000 mL | Freq: Once | INTRAMUSCULAR | Status: AC | PRN
  Administered 2014-01-12: 100 mL via INTRAVENOUS

## 2014-01-12 MED ORDER — HYDROCODONE-ACETAMINOPHEN 5-300 MG PO TABS: 1.0000 | ORAL_TABLET | ORAL | Status: DC | PRN

## 2014-01-12 NOTE — Addendum Note (Signed)
Addended by: Malissa HippoEHMAN, NAJEEB U on: 01/12/2014 03:12 PM   Modules accepted: Orders

## 2014-01-12 NOTE — Addendum Note (Signed)
Addended by: Shona NeedlesDD, Saleah Rishel M on: 01/12/2014 03:47 PM   Modules accepted: Orders

## 2014-01-12 NOTE — Telephone Encounter (Signed)
Per Dr.Rehman:Patient needs to have the following labs- CBC,LFT,Amylase,Lipase , Creatinine. CT Abdominal/Pelvic with contrast today or tomorrow. She is to stay on a full liquid diet Use the Miralax as directed,if constipation is so bad this does not help she may use a Fleet Enema. New prescription for the Vicodin 5/300 mg take 1 by mouth every 4 hours prn pain #60 - he ask that Lindsay Palmer write this in his absence. Patient will be seen in the office 01/13/14 @ 1:30 pm.  Mr.Godfrey was called and made aware of all of Dr.Rehman's recommendations and wife's appointment.

## 2014-01-12 NOTE — Telephone Encounter (Addendum)
Mr. Carleene Mainsurpin states that his wife is having same symptoms as she did before the stent was put in. Questions if bowels are backing up. She has had discomfort where it comes and goes,on last Thursday she started having stomach pain,chills,weakness,constipation and trouble urinating.She has had No fever. Mr.Bauza states that she is very sensitive where he has seen Dr.Rehman touch during her examine. He also says that she needs another prescription for Vicodin as this helps a little. He also wants her seen today.  Mr.Knisely was made aware that Dr.Rehman was out of office but he would be made aware and I would call him back.

## 2014-01-13 ENCOUNTER — Ambulatory Visit (INDEPENDENT_AMBULATORY_CARE_PROVIDER_SITE_OTHER): Payer: Medicare Other | Admitting: Internal Medicine

## 2014-01-15 ENCOUNTER — Ambulatory Visit (INDEPENDENT_AMBULATORY_CARE_PROVIDER_SITE_OTHER): Payer: Medicare Other | Admitting: Internal Medicine

## 2014-01-15 ENCOUNTER — Encounter (INDEPENDENT_AMBULATORY_CARE_PROVIDER_SITE_OTHER): Payer: Self-pay | Admitting: Internal Medicine

## 2014-01-15 VITALS — BP 120/84 | HR 66 | Temp 97.6°F | Resp 18 | Ht 64.0 in | Wt 163.0 lb

## 2014-01-15 DIAGNOSIS — K5901 Slow transit constipation: Secondary | ICD-10-CM

## 2014-01-15 DIAGNOSIS — K863 Pseudocyst of pancreas: Secondary | ICD-10-CM

## 2014-01-15 MED ORDER — HYDROCODONE-ACETAMINOPHEN 5-325 MG PO TABS: 1.0000 | ORAL_TABLET | Freq: Four times a day (QID) | ORAL | Status: DC | PRN

## 2014-01-15 NOTE — Progress Notes (Signed)
Presenting complaint;  Abdominal pain and constipation. Patient with pancreatic pseudocysts.  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 First procedure was back in June 2015. Biliary stent was removed on 11/07/2013. Patient's husband Mellody DanceKeith called earlier this week she was having upper and lower abdominal pain also was nauseated. She had chills but no fever. Lab studies were performed along with abdominopelvic CT. Ration has been keeping symptom diary since 10/29/2013. She's had 8 episodes of nausea and chills but no fever. She had 10 episodes of "stomach pain"20 which were severe she got relief with pain medication. All of these episodes of pain were associated with nausea and chills but no fever. 4 days ago she noted pain across lower abdomen that has now resolved. She has not experienced vomiting. Her weight is down by 1 pound since her visit of 10/20/2013. Her appetite is fair. She eats 3 meals a day and her husband states she does not eat fiber rich foods. She is using polyethylene glycol on as-needed basis for constipation. She denies melena or rectal bleeding. When she was constipated she also had difficulty voiding but not having any more problems she did have diarrhea after she drank contrast for CT. Nausea and generally occurs in the mornings and at times made worse with meals. She takes meloxicam after evening meal.   Current Medications: Outpatient Encounter Prescriptions as of 01/15/2014  Medication Sig  . amLODipine (NORVASC) 5 MG tablet Take 5 mg by mouth daily.  . ARIPiprazole (ABILIFY) 10 MG tablet Take 10 mg by mouth at bedtime.  . calcium carbonate (TUMS - DOSED IN MG ELEMENTAL CALCIUM) 500 MG chewable tablet Chew 1 tablet by mouth 2 (two) times daily.  . Cholecalciferol (VITAMIN D) 400 UNITS capsule Take 400 Units by mouth daily.  Marland Kitchen. Fish Oil-Cholecalciferol (FISH OIL + D3 PO) Take 1,200  mg by mouth 2 (two) times daily.   . furosemide (LASIX) 40 MG tablet Take 1 tablet (40 mg total) by mouth daily as needed.  . Hydrocodone-Acetaminophen (VICODIN) 5-300 MG TABS Take 1 tablet by mouth every 4 (four) hours as needed.  . lamoTRIgine (LAMICTAL) 200 MG tablet Take 200 mg by mouth daily.   Marland Kitchen. losartan (COZAAR) 100 MG tablet Take 100 mg by mouth daily.  . meloxicam (MOBIC) 15 MG tablet Take 15 mg by mouth daily.  . metoprolol tartrate (LOPRESSOR) 25 MG tablet Take 12.5 mg by mouth 2 (two) times daily.   Marland Kitchen. omeprazole (PRILOSEC) 40 MG capsule Take 1 capsule (40 mg total) by mouth daily. (Patient taking differently: Take 20 mg by mouth daily. )  . ondansetron (ZOFRAN) 4 MG tablet Take 1 tablet (4 mg total) by mouth every 8 (eight) hours as needed for nausea or vomiting.  . polyethylene glycol powder (MIRALAX) powder Take 8.5 g by mouth every other day.  . potassium chloride (K-DUR) 10 MEQ tablet Take 1 tablet (10 mEq total) by mouth daily as needed.  . simvastatin (ZOCOR) 20 MG tablet Take 20 mg by mouth every evening.   . vitamin B-12 (CYANOCOBALAMIN) 1000 MCG tablet Take 1,000 mcg by mouth daily.  Marland Kitchen. zolpidem (AMBIEN) 10 MG tablet Take 10 mg by mouth at bedtime.     Objective: Blood pressure 120/84, pulse 66, temperature 97.6 F (36.4 C), temperature source Oral, resp. rate 18, height 5\' 4"  (1.626 m), weight 163 lb (73.936 kg). Patient is alert and in no acute distress. Conjunctiva is pink. Sclera  is nonicteric Oropharyngeal mucosa is normal. No neck masses or thyromegaly noted. Cardiac exam with regular rhythm normal S1 and S2. No murmur or gallop noted. Lungs are clear to auscultation. Abdomen is full. Bowel sounds are normal. Abdomen is soft with mild tenderness in that he umbilical region and epigastrium but no mass fullness or organomegaly noted.  No LE edema or clubbing noted.  Labs/studies Results:   Recent Labs  01/12/14 1619  WBC 6.8  HGB 12.2  HCT 37.3  PLT 239     BMET   Recent Labs  01/12/14 1619  CREATININE 0.82    LFT   Recent Labs  01/12/14 1619  PROT 7.1  ALBUMIN 3.6  AST 25  ALT 25  ALKPHOS 169*  BILITOT 0.4  BILIDIR 0.1  IBILI 0.3     abdominopelvic CT from 01/12/2014 reviewed along with patient and her husband and compared with CT of 10/15/2013. Both pancreatic pseudocysts have decreased in size particularly one along transverse colon. CBD is at upper limit of normal but caliber has decreased since CT of 10/15/2013. She has mild sigmoid colon diverticulosis and wall thickening to distal sigmoid and rectum with surrounding inflammatory changes.    Assessment:  #1. 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 Both fluid collections are decreasing in size and there is no indication for further intervention.  #2. History of distal CBD stricture. Bile duct was stented for 3 months. CBD caliber is smaller than was 3 months ago. Transaminases are normal. Mildly elevated alkaline phosphatase is nonspecific and will need to be repeated at the time of next visit. #3. Chronic constipation. Constipation appears to be multifactorial. Diet appears to be low in fiber. #4. Thickening to distal sigmoid colon and rectum most likely due to constipation. She has no symptoms to suggest active colitis. #5. Nausea. This symptom is less likely due to pseudocyst and more likely due to one of her medications or meloxicam.   Plan:  Patient advised to decrease meloxicam dose to 7.5 mg by mouth daily. Will also check with her PCP Dr. Alinda DeemStephen [name]. Fiber choice 2 tablets daily or Benefiber 4 g by mouth daily at bedtime. She should encourage take polyethylene glycol daily. She can use glycerin or Dulcolax suppository or M OM1 as-needed basis. New prescription given for hydrocodone/acetaminophen 5/325 by mouth every 6 when necessary 60 doses without a refill. Patient advised to call office if she has temperature greater than 100.5 F  or if she has pain not relieved with medication. Office visit on 03/02/2014.

## 2014-01-15 NOTE — Patient Instructions (Signed)
Fiber choice 2 tablets daily or Benefiber 4 g by mouth daily at bedtime. Decrease meloxicam dose to 7.5 mg daily if possible. Remember to take polyethylene glycol daily. Can use Dulcolax or glycerin suppository or milk of magnesia on as needed basis for constipation. Call if you have temp greater than 100.5 F

## 2014-01-19 ENCOUNTER — Ambulatory Visit (INDEPENDENT_AMBULATORY_CARE_PROVIDER_SITE_OTHER): Payer: Medicare Other | Admitting: Internal Medicine

## 2014-01-27 ENCOUNTER — Ambulatory Visit (INDEPENDENT_AMBULATORY_CARE_PROVIDER_SITE_OTHER): Payer: Medicare Other | Admitting: Internal Medicine

## 2014-01-28 ENCOUNTER — Encounter (INDEPENDENT_AMBULATORY_CARE_PROVIDER_SITE_OTHER): Payer: Self-pay

## 2014-02-03 ENCOUNTER — Ambulatory Visit (INDEPENDENT_AMBULATORY_CARE_PROVIDER_SITE_OTHER): Payer: Medicare Other | Admitting: Internal Medicine

## 2014-03-02 ENCOUNTER — Ambulatory Visit (INDEPENDENT_AMBULATORY_CARE_PROVIDER_SITE_OTHER): Payer: Medicare Other | Admitting: Internal Medicine

## 2014-03-02 ENCOUNTER — Encounter (INDEPENDENT_AMBULATORY_CARE_PROVIDER_SITE_OTHER): Payer: Self-pay | Admitting: Internal Medicine

## 2014-03-02 VITALS — BP 128/72 | HR 66 | Temp 97.1°F | Resp 18 | Ht 64.0 in | Wt 167.5 lb

## 2014-03-02 DIAGNOSIS — R1011 Right upper quadrant pain: Secondary | ICD-10-CM

## 2014-03-02 DIAGNOSIS — K863 Pseudocyst of pancreas: Secondary | ICD-10-CM

## 2014-03-02 DIAGNOSIS — R110 Nausea: Secondary | ICD-10-CM

## 2014-03-02 MED ORDER — POLYETHYLENE GLYCOL 3350 17 GM/SCOOP PO POWD: 8.5000 g | Freq: Every day | ORAL | Status: DC | PRN

## 2014-03-02 NOTE — Progress Notes (Signed)
Presenting complaint;   intermittent right upper quadrant pain and nausea.  History of pancreatic pseudocyst.  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 She was last seen on 01/15/2014. She has Symptom diary as recommended. She is doing much better as fast constipation is concerned. Since she has been in fiber supplement her bowels to move daily and she has not use polyethylene glycol in one month. She only had single episode of pain in left low quadrant which resolved spontaneously and she wonders if she pulled a muscle. Continues to complain of postprandial nausea and intermittent right upper quadrant pain. Nausea is almost every day she's never vomited. She has relief with ondansetron. She has had intermittent pain eating fried food. She has taken pain medication on 5 different occasions. She has had chills but no fever. She is using furosemide no more than once or twice a week for lower extremity edema. Her appetite is fair and she has gained 4 pounds since last visit. While she does some housework she does not do any exercise.   Current Medications: Outpatient Encounter Prescriptions as of 03/02/2014  Medication Sig  . amLODipine (NORVASC) 5 MG tablet Take 5 mg by mouth daily.  . ARIPiprazole (ABILIFY) 10 MG tablet Take 10 mg by mouth at bedtime.  . calcium carbonate (TUMS - DOSED IN MG ELEMENTAL CALCIUM) 500 MG chewable tablet Chew 1 tablet by mouth 2 (two) times daily.  . Cholecalciferol (VITAMIN D) 400 UNITS capsule Take 400 Units by mouth daily.  Marland Kitchen Fish Oil-Cholecalciferol (FISH OIL + D3 PO) Take 1,200 mg by mouth 2 (two) times daily.   . furosemide (LASIX) 40 MG tablet Take 1 tablet (40 mg total) by mouth daily as needed.  Marland Kitchen HYDROcodone-acetaminophen (LORTAB) 5-325 MG per tablet Take 1 tablet by mouth every 6 (six) hours as needed for  moderate pain.  Marland Kitchen lamoTRIgine (LAMICTAL) 200 MG tablet Take 200 mg by mouth daily.   Marland Kitchen losartan (COZAAR) 100 MG tablet Take 100 mg by mouth daily.  . meloxicam (MOBIC) 15 MG tablet Take 15 mg by mouth daily.  . metoprolol tartrate (LOPRESSOR) 25 MG tablet Take 12.5 mg by mouth 2 (two) times daily.   Marland Kitchen omeprazole (PRILOSEC) 40 MG capsule Take 1 capsule (40 mg total) by mouth daily. (Patient taking differently: Take 20 mg by mouth 2 (two) times daily. )  . ondansetron (ZOFRAN) 4 MG tablet Take 1 tablet (4 mg total) by mouth every 8 (eight) hours as needed for nausea or vomiting.  Marland Kitchen OVER THE COUNTER MEDICATION Fiber well - 5 grams 1 chewable tablet twice daily.  . polyethylene glycol powder (MIRALAX) powder Take 8.5 g by mouth every other day. (Patient taking differently: Take 8.5 g by mouth every other day. PRN per patient)  . potassium chloride (K-DUR) 10 MEQ tablet Take 1 tablet (10 mEq total) by mouth daily as needed.  . simvastatin (ZOCOR) 20 MG tablet Take 20 mg by mouth every evening.   . vitamin B-12 (CYANOCOBALAMIN) 1000 MCG tablet Take 1,000 mcg by mouth daily.  Marland Kitchen zolpidem (AMBIEN) 10 MG tablet Take 10 mg by mouth at bedtime.     Objective: Blood pressure 128/72, pulse 66, temperature 97.1 F (36.2 C), temperature source Oral, resp. rate 18, height  (1.626 m), weight 167 lb 8 oz (75.978 kg).  patient is alert and in no acute distress. Conjunctiva  is pink. Sclera is nonicteric Oropharyngeal mucosa is normal. No neck masses or thyromegaly noted. Cardiac exam with regular rhythm normal S1 and S2. No murmur or gallop noted. Lungs are clear to auscultation. Abdomen is full. Bowel sounds are normal. On palpation abdomen is soft with mild tenderness below the right costal margin. No organomegaly or masses. No LE edema or clubbing noted.    Assessment:  #1.  Intermittent right upper quadrant pain associated with nausea. Symptoms are usually triggered with fried food.  Doubt that  these symptoms are due to relapsing pancreatitis. Symptoms could be due to gastroparesis or dyspepsia. #2.  Pancreatic pseudocysts. Last CT was on 01/12/2014 revealing decrease in size of both pseudocysts. No mass palpated on abdominal exam. #3.  Constipation. She is doing better with dietary measures and has not required polyethylene glycol in 1 month.    Plan:  Patient advised not to eat fried foods. Will check LFTs and serum lipase. If blood work is normal will proceed with solid phase gastric emptying study. Office visit in 2 months.

## 2014-03-02 NOTE — Patient Instructions (Signed)
Physician will call with results of blood tests and further recommendations. Try to exercise daily as discussed.

## 2014-03-03 LAB — LIPASE: Lipase: 10 U/L (ref 0–75)

## 2014-03-03 LAB — HEPATIC FUNCTION PANEL
ALBUMIN: 3.9 g/dL (ref 3.5–5.2)
ALT: 20 U/L (ref 0–35)
AST: 19 U/L (ref 0–37)
Alkaline Phosphatase: 131 U/L — ABNORMAL HIGH (ref 39–117)
BILIRUBIN DIRECT: 0.1 mg/dL (ref 0.0–0.3)
Indirect Bilirubin: 0.2 mg/dL (ref 0.2–1.2)
Total Bilirubin: 0.3 mg/dL (ref 0.2–1.2)
Total Protein: 6.8 g/dL (ref 6.0–8.3)

## 2014-03-31 ENCOUNTER — Telehealth (INDEPENDENT_AMBULATORY_CARE_PROVIDER_SITE_OTHER): Payer: Self-pay | Admitting: *Deleted

## 2014-03-31 DIAGNOSIS — R6883 Chills (without fever): Secondary | ICD-10-CM

## 2014-03-31 DIAGNOSIS — R110 Nausea: Secondary | ICD-10-CM

## 2014-03-31 DIAGNOSIS — R109 Unspecified abdominal pain: Secondary | ICD-10-CM

## 2014-03-31 NOTE — Telephone Encounter (Signed)
Lindsay JacobsonHelen is having nausea, chills and stomach pain. These are the symptoms she is having daily to every other day. Her return phone number is [phone].

## 2014-03-31 NOTE — Telephone Encounter (Signed)
Per Dr.Rehman the patient is to have lab work CBC, Lipase,Amylase,Hepatic Profile. Patient was called and made aware,orders have been faxed to [facility]. She will get them drawn 04/01/14.

## 2014-03-31 NOTE — Telephone Encounter (Signed)
Per Dr.Rehman the patient will need to have labs drawn as noted above. Patient called

## 2014-04-01 LAB — CBC
HCT: 37 % (ref 36.0–46.0)
Hemoglobin: 12.4 g/dL (ref 12.0–15.0)
MCH: 28.7 pg (ref 26.0–34.0)
MCHC: 33.5 g/dL (ref 30.0–36.0)
MCV: 85.6 fL (ref 78.0–100.0)
MPV: 10.6 fL (ref 8.6–12.4)
PLATELETS: 223 10*3/uL (ref 150–400)
RBC: 4.32 MIL/uL (ref 3.87–5.11)
RDW: 14.3 % (ref 11.5–15.5)
WBC: 6.3 10*3/uL (ref 4.0–10.5)

## 2014-04-02 LAB — HEPATIC FUNCTION PANEL
ALBUMIN: 4 g/dL (ref 3.5–5.2)
ALK PHOS: 128 U/L — AB (ref 39–117)
ALT: 15 U/L (ref 0–35)
AST: 16 U/L (ref 0–37)
Bilirubin, Direct: 0.1 mg/dL (ref 0.0–0.3)
Indirect Bilirubin: 0.6 mg/dL (ref 0.2–1.2)
Total Bilirubin: 0.7 mg/dL (ref 0.2–1.2)
Total Protein: 6.7 g/dL (ref 6.0–8.3)

## 2014-04-02 LAB — LIPASE: Lipase: <10 U/L (ref 0–75)

## 2014-04-02 LAB — AMYLASE: Amylase: 37 U/L (ref 0–105)

## 2014-05-04 ENCOUNTER — Ambulatory Visit (INDEPENDENT_AMBULATORY_CARE_PROVIDER_SITE_OTHER): Payer: Medicare Other | Admitting: Internal Medicine

## 2014-05-19 ENCOUNTER — Ambulatory Visit (INDEPENDENT_AMBULATORY_CARE_PROVIDER_SITE_OTHER): Payer: Medicare Other | Admitting: Internal Medicine

## 2014-05-19 ENCOUNTER — Encounter (INDEPENDENT_AMBULATORY_CARE_PROVIDER_SITE_OTHER): Payer: Self-pay | Admitting: Internal Medicine

## 2014-05-19 VITALS — BP 138/72 | HR 76 | Temp 98.1°F | Ht 64.0 in | Wt 173.4 lb

## 2014-05-19 DIAGNOSIS — R748 Abnormal levels of other serum enzymes: Secondary | ICD-10-CM

## 2014-05-19 DIAGNOSIS — K851 Biliary acute pancreatitis without necrosis or infection: Secondary | ICD-10-CM

## 2014-05-19 LAB — HEPATIC FUNCTION PANEL
ALBUMIN: 4.1 g/dL (ref 3.5–5.2)
ALT: 18 U/L (ref 0–35)
AST: 19 U/L (ref 0–37)
Alkaline Phosphatase: 141 U/L — ABNORMAL HIGH (ref 39–117)
BILIRUBIN DIRECT: 0.1 mg/dL (ref 0.0–0.3)
BILIRUBIN TOTAL: 0.3 mg/dL (ref 0.2–1.2)
Indirect Bilirubin: 0.2 mg/dL (ref 0.2–1.2)
Total Protein: 7 g/dL (ref 6.0–8.3)

## 2014-05-19 NOTE — Patient Instructions (Signed)
OV in 3 months with Dr. Rehman 

## 2014-05-19 NOTE — Progress Notes (Signed)
Subjective:    Patient ID: Lindsay Palmer, female    DOB: 06-20-1940, 74 y.o.   MRN: [id number]  HPI Here today for f/u. Last seen in February by Dr. Karilyn Cotaehman. Hx of pancreatic pseudocyst secondary to post ERCP pancreatitits. Stent removed in 10/2014.  She underwent ERCP in June of 2015 for hx of distal CBD stricture. She tells me she has some lower abdominal pain.  She still has some nausea and chills. Her last episode of lower abdominal pain was 05/13/2014.  Chills but no fever. She has the lower abdominal pain off and on since the ERCP. Usually has one stool a day.   No melena or BRRB. The abdominal pain will last about all morning and then resolve. She says she has no energy.  Her appetite is good. No weight loss.    Hepatic Function Panel     Component Value Date/Time   PROT 6.7 03/31/2014 1334   ALBUMIN 4.0 03/31/2014 1334   AST 16 03/31/2014 1334   ALT 15 03/31/2014 1334   ALKPHOS 128* 03/31/2014 1334   BILITOT 0.7 03/31/2014 1334   BILIDIR 0.1 03/31/2014 1334   IBILI 0.6 03/31/2014 1334       01/12/2014 CT abdomen/pelvis with CM:  IMPRESSION: Old pancreatic pseudocyst noted on prior exam are significantly smaller currently. There is no evidence of acute pancreatitis currently.  Mild sigmoid diverticulosis is is again noted. Increased wall thickening of the distal portion of the sigmoid colon and rectum is noted with surrounding inflammatory changes suggesting proctocolitis   Review of Systems Past Medical History  Diagnosis Date  . GERD (gastroesophageal reflux disease)   . Bipolar 2 disorder   . Major depressive disorder   . Chronic insomnia   . Non-toxic multinodular goiter   . Osteopenia   . Vitamin D deficiency   . Migraine   . Elevated LFTs   . Diverticulosis of colon   . H/O first degree atrioventricular block   . Fatty liver disease, nonalcoholic   . Hypertension   . Hypercholesteremia   . Sleep apnea     Stop Bang score of 4. Pt said she had  sleep apnea at one time, but she has had another sleep study since then and they said she does not have it anymore.  . Pancreatitis     Past Surgical History  Procedure Laterality Date  . Neck surgery      Stenosis C6-C7  . Cholecystectomy    . Cataract surgery Bilateral 08-2012  . Colonoscopy  2003    Diverticulitis h/o polyps -due next 2015 -gastroscopy 2003  . Sigmoidoscopy  2007    no polyps  . Ercp    . Ercp N/A 07/18/2013    Procedure: ENDOSCOPIC RETROGRADE CHOLANGIOPANCREATOGRAPHY (ERCP) COMMON BILE DUCT BRUSHING;  Surgeon: Malissa HippoNajeeb U Rehman, MD;  Location: [facility];  Service: Endoscopy;  Laterality: N/A;  . Sphincterotomy N/A 07/18/2013    Procedure: SPHINCTEROTOMY;  Surgeon: Malissa HippoNajeeb U Rehman, MD;  Location: [facility];  Service: Endoscopy;  Laterality: N/A;  . Biliary stent placement N/A 07/18/2013    Procedure: BILIARY STENT PLACEMENT;  Surgeon: Malissa HippoNajeeb U Rehman, MD;  Location: [facility];  Service: Endoscopy;  Laterality: N/A;  . Eye surgery Bilateral     cataract removal  . Ercp N/A 11/07/2013    Procedure: ENDOSCOPIC RETROGRADE CHOLANGIOPANCREATOGRAPHY (ERCP);  Surgeon: Malissa HippoNajeeb U Rehman, MD;  Location: [facility];  Service: Endoscopy;  Laterality: N/A;  . Stent removal N/A 11/07/2013    Procedure: Francine GravenSTENT  REMOVAL;  Surgeon: Malissa Hippo, MD;  Location: [facility];  Service: Endoscopy;  Laterality: N/A;  . Sphincterotomy  11/07/2013    Procedure: EXTENSION OF SPHINCTEROTOMY;  Surgeon: Malissa Hippo, MD;  Location: [facility];  Service: Endoscopy;;    Allergies  Allergen Reactions  . Codeine Nausea And Vomiting  . Oxycodone     Current Outpatient Prescriptions on File Prior to Visit  Medication Sig Dispense Refill  . amLODipine (NORVASC) 5 MG tablet Take 5 mg by mouth daily.    . ARIPiprazole (ABILIFY) 10 MG tablet Take 10 mg by mouth at bedtime.    . calcium carbonate (TUMS - DOSED IN MG ELEMENTAL CALCIUM) 500 MG chewable tablet Chew 1 tablet by mouth 2 (two) times daily.    . Cholecalciferol  (VITAMIN D) 400 UNITS capsule Take 400 Units by mouth daily.    Marland Kitchen Fish Oil-Cholecalciferol (FISH OIL + D3 PO) Take 1,200 mg by mouth 2 (two) times daily.     . furosemide (LASIX) 40 MG tablet Take 1 tablet (40 mg total) by mouth daily as needed. 30 tablet 0  . HYDROcodone-acetaminophen (LORTAB) 5-325 MG per tablet Take 1 tablet by mouth every 6 (six) hours as needed for moderate pain. 60 tablet 0  . lamoTRIgine (LAMICTAL) 200 MG tablet Take 200 mg by mouth daily.     Marland Kitchen losartan (COZAAR) 100 MG tablet Take 100 mg by mouth daily.    . meloxicam (MOBIC) 15 MG tablet Take 15 mg by mouth daily.    . metoprolol tartrate (LOPRESSOR) 25 MG tablet Take 12.5 mg by mouth 2 (two) times daily.     Marland Kitchen omeprazole (PRILOSEC) 40 MG capsule Take 1 capsule (40 mg total) by mouth daily. (Patient taking differently: Take 20 mg by mouth 2 (two) times daily. ) 90 capsule 3  . ondansetron (ZOFRAN) 4 MG tablet Take 1 tablet (4 mg total) by mouth every 8 (eight) hours as needed for nausea or vomiting. 30 tablet 1  . OVER THE COUNTER MEDICATION Fiber well - 5 grams 1 chewable tablet twice daily.    . polyethylene glycol powder (MIRALAX) powder Take 8.5 g by mouth daily as needed. PRN per patient 527 g 3  . potassium chloride (K-DUR) 10 MEQ tablet Take 1 tablet (10 mEq total) by mouth daily as needed. 30 tablet 0  . simvastatin (ZOCOR) 20 MG tablet Take 20 mg by mouth every evening.     . vitamin B-12 (CYANOCOBALAMIN) 1000 MCG tablet Take 1,000 mcg by mouth daily.    Marland Kitchen zolpidem (AMBIEN) 10 MG tablet Take 10 mg by mouth at bedtime.     No current facility-administered medications on file prior to visit.        Objective:   Physical Exam Blood pressure 138/72, pulse 76, temperature 98.1 F (36.7 C), height  (1.626 m), weight 173 lb 6.4 oz (78.654 kg).  Alert and oriented. Skin warm and dry. Oral mucosa is moist.   . Sclera anicteric, conjunctivae is pink. Thyroid not enlarged. No cervical lymphadenopathy. Lungs  clear. Heart regular rate and rhythm.  Abdomen is soft. Bowel sounds are positive. No hepatomegaly. No abdominal masses felt. No tenderness.  No edema to lower extremities. Stool brown and guaiac negative.   Developer: [id number], Exp 9-17 Card: Lot 02-14, Exp 07.18     Assessment & Plan:  Pancreatitis post ERCP resolved. No upper abdominal pain. Pancreatic pseudocyst which is smaller now. Constipation which has improved since taking Miralax.  Hepatic  function today.  I have asked Lupita Leash to locate last colonoscopy report.

## 2014-05-21 ENCOUNTER — Telehealth (HOSPITAL_COMMUNITY): Payer: Self-pay | Admitting: *Deleted

## 2014-05-21 NOTE — Telephone Encounter (Signed)
Received a fax from insurance company for a Quantity Limit Exception for Ambien. This patient is not seen in [facility] outpatient department and therefore can not complete form to fax back. Notified insurance company that patient is not seen at this office.

## 2014-06-18 ENCOUNTER — Encounter (INDEPENDENT_AMBULATORY_CARE_PROVIDER_SITE_OTHER): Payer: Self-pay

## 2014-06-26 ENCOUNTER — Encounter (INDEPENDENT_AMBULATORY_CARE_PROVIDER_SITE_OTHER): Payer: Self-pay

## 2014-07-01 ENCOUNTER — Other Ambulatory Visit (INDEPENDENT_AMBULATORY_CARE_PROVIDER_SITE_OTHER): Payer: Self-pay | Admitting: Internal Medicine

## 2014-07-12 ENCOUNTER — Other Ambulatory Visit (INDEPENDENT_AMBULATORY_CARE_PROVIDER_SITE_OTHER): Payer: Self-pay | Admitting: Internal Medicine

## 2014-07-29 ENCOUNTER — Encounter (HOSPITAL_COMMUNITY): Payer: Self-pay | Admitting: Psychiatry

## 2014-07-29 ENCOUNTER — Ambulatory Visit (INDEPENDENT_AMBULATORY_CARE_PROVIDER_SITE_OTHER): Payer: Medicare Other | Admitting: Psychiatry

## 2014-07-29 VITALS — BP 140/68 | Ht 64.0 in | Wt 175.0 lb

## 2014-07-29 DIAGNOSIS — F3130 Bipolar disorder, current episode depressed, mild or moderate severity, unspecified: Secondary | ICD-10-CM

## 2014-07-29 MED ORDER — ZOLPIDEM TARTRATE 10 MG PO TABS: 10.0000 mg | ORAL_TABLET | Freq: Every day | ORAL | Status: DC

## 2014-07-29 NOTE — Progress Notes (Signed)
Psychiatric Initial Adult Assessment   Patient Identification: Lindsay Palmer MRN:  [id number] Date of Evaluation:  07/29/2014 Referral Source: [facility] Chief Complaint:   Chief Complaint    Depression; Manic Behavior; Establish Care     Visit Diagnosis:    ICD-9-CM ICD-10-CM   1. Bipolar I disorder, most recent episode depressed 296.50 F31.30    Diagnosis:   Patient Active Problem List   Diagnosis Date Noted  . Bipolar I disorder, most recent episode depressed [F31.30] 07/29/2014  . Unspecified constipation [K59.00] 09/05/2013  . 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 Anemia [D64.9] 07/23/2013  . Drug-induced pancreatitis DUE TO CONTRAST [K85.3] 07/19/2013  . Benign essential HTN [I10] 07/19/2013  . Urinary retention [R33.9] 07/19/2013  . Acute pancreatitis [K85.9] 07/19/2013  . Abdominal pain [R10.9] 07/10/2013   History of Present Illness:  This patient is a 74 year old married white female who lives with her husband in [place]. She has a son, daughter and 3 grandchildren. She was working in [employer] prior to her retirement.  The patient was referred by [facility] where she had been receiving treatment. Her physician recently left the practice and they are no longer taking her insurance. She has a long-term history of bipolar disorder.  The patient presents with her husband today. They have been married since she was 74 years old. He states that for much of their marriage she had bouts of severe mood swings. At times she was pleasant and easy to be with the other time she was angry irritable and out of control. She also went through periods of euphoria and spending a lot of money. At one point they went to a family counselor who suspected she had bipolar disorder. She was eventually referred here to this office and saw Dr. Lolly MustacheArfeen. He started her on Abilify and it really helped in terms of decreasing the anger and irritability. Eventually  however she started seeing Dr. Tomasa Randunningham at [facility] because she was still having difficulties with being depressed. He added Lamictal and this seemed to stabilize her mood and she was no longer depressed or manic. The patient has been on this combination of medicines for several years and she seems very stable now. Her only difficulty is that she doesn't sleep well even with the addition of Ambien 10 mg at bedtime.  At times the patient sleeps up to 5 hours at night and sometimes only 2 hours. She's tried almost every medication there is for sleep. Her mood is generally good. However she is often tired in her energy is variable. She had a history of pancreatitis last year after having ERCP and the contrast dye caused the pancreatitis. She still has bouts of nausea and weakness. She's been followed by GI in her laboratories are normal. Her husband thinks she spends too much time in bed. She spends most of her time doing housework. She is stressed because her son and her husband are not getting along . her son is on disability due to a back injury but is trying to work doing Aeronautical engineerlandscaping which worries her  The patient has never had a psychiatric hospitalization. She does not use drugs or alcohol and has never had any psychotic symptoms such as auditory or visual hallucinations or paranoia Elements:  Location:  Global. Quality:  Waxing and waning. Severity:  Low at this point, well controlled on medications. Timing:  Began when she was around 74 years old. Duration:  Years. Context:  Probably biological. Associated Signs/Symptoms: Depression  Symptoms:  depressed mood, insomnia, psychomotor retardation, anxiety, disturbed sleep, (Hypo) Manic Symptoms:  Impulsivity, Irritable Mood,   Past Medical History:  Past Medical History  Diagnosis Date  . GERD (gastroesophageal reflux disease)   . Bipolar 2 disorder   . Major depressive disorder   . Chronic insomnia   . Osteopenia   .  Vitamin D deficiency   . Migraine   . Elevated LFTs   . Diverticulosis of colon   . H/O first degree atrioventricular block   . Fatty liver disease, nonalcoholic   . Hypertension   . Hypercholesteremia   . Sleep apnea     Stop Bang score of 4. Pt said she had sleep apnea at one time, but she has had another sleep study since then and they said she does not have it anymore.  . Pancreatitis     Past Surgical History  Procedure Laterality Date  . Neck surgery      Stenosis C6-C7  . Cholecystectomy    . Cataract surgery Bilateral 08-2012  . Colonoscopy  2003    Diverticulitis h/o polyps -due next 2015 -gastroscopy 2003  . Sigmoidoscopy  2007    no polyps  . Ercp    . Ercp N/A 07/18/2013    Procedure: ENDOSCOPIC RETROGRADE CHOLANGIOPANCREATOGRAPHY (ERCP) COMMON BILE DUCT BRUSHING;  Surgeon: Malissa Hippo, MD;  Location: [facility];  Service: Endoscopy;  Laterality: N/A;  . Sphincterotomy N/A 07/18/2013    Procedure: SPHINCTEROTOMY;  Surgeon: Malissa Hippo, MD;  Location: [facility];  Service: Endoscopy;  Laterality: N/A;  . Biliary stent placement N/A 07/18/2013    Procedure: BILIARY STENT PLACEMENT;  Surgeon: Malissa Hippo, MD;  Location: [facility];  Service: Endoscopy;  Laterality: N/A;  . Eye surgery Bilateral     cataract removal  . Ercp N/A 11/07/2013    Procedure: ENDOSCOPIC RETROGRADE CHOLANGIOPANCREATOGRAPHY (ERCP);  Surgeon: Malissa Hippo, MD;  Location: [facility];  Service: Endoscopy;  Laterality: N/A;  . Stent removal N/A 11/07/2013    Procedure: STENT REMOVAL;  Surgeon: Malissa Hippo, MD;  Location: [facility];  Service: Endoscopy;  Laterality: N/A;  . Sphincterotomy  11/07/2013    Procedure: EXTENSION OF SPHINCTEROTOMY;  Surgeon: Malissa Hippo, MD;  Location: [facility];  Service: Endoscopy;;   Family History:  Family History  Problem Relation Age of Onset  . Diabetes Mother   . Coronary artery disease Father   . Gout Father   . Breast cancer Sister   . Pancreatic cancer Brother    . Alcohol abuse Brother   . Healthy Brother   . Lung cancer Sister   . Healthy Sister   . Depression Sister   . Hyperlipidemia Daughter   . Hyperlipidemia Son   . Bipolar disorder Son   . Healthy Sister   . Healthy Sister   . Seizures Other    Social History:   History   Social History  . Marital Status: Married    Spouse Name: N/A  . Number of Children: N/A  . Years of Education: N/A   Social History Main Topics  . Smoking status: Never Smoker   . Smokeless tobacco: Never Used  . Alcohol Use: No  . Drug Use: No  . Sexual Activity: Not on file   Other Topics Concern  . None   Social History Narrative   Additional Social History: The patient grew up in [place] with both parents. Her parents argued a lot but there was no domestic violence.  The patient completed high school but married very young. 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 She denies any history of abuse. She has few interests or hobbies and does not get any exercise  Musculoskeletal: Strength & Muscle Tone: within normal limits Gait & Station: normal Patient leans: N/A  Psychiatric Specialty Exam: HPI  Review of Systems  Gastrointestinal: Positive for abdominal pain.  Psychiatric/Behavioral: The patient has insomnia.   All other systems reviewed and are negative.   Blood pressure 140/68, height  (1.626 m), weight 175 lb (79.379 kg).Body mass index is 30.02 kg/(m^2).  General Appearance: Casual, Neat and Well Groomed  Eye Contact:  Good  Speech:  Clear and Coherent  Volume:  Normal  Mood:  Euthymic  Affect:  Appropriate and Flat  Thought Process:  Goal Directed  Orientation:  Full (Time, Place, and Person)  Thought Content:  WDL  Suicidal Thoughts:  No  Homicidal Thoughts:  No  Memory:  Immediate;   Fair Recent;   Fair Remote;   Fair  Judgement:  Good  Insight:  Good  Psychomotor Activity:  Normal  Concentration:   Fair  Recall:  Fair  Fund of Knowledge:Good  Language: Good  Akathisia:  No  Handed:  Right  AIMS (if indicated):    Assets:  Communication Skills Desire for Improvement Leisure Time Resilience Social Support  ADL's:  Intact  Cognition: WNL  Sleep:  Irritable but not as good as it should be    Is the patient at risk to self?  No. Has the patient been a risk to self in the past 6 months?  No. Has the patient been a risk to self within the distant past?  No. Is the patient a risk to others?  No. Has the patient been a risk to others in the past 6 months?  No. Has the patient been a risk to others within the distant past?  No.  Allergies:   Allergies  Allergen Reactions  . Codeine Nausea And Vomiting  . Oxycodone    Current Medications: Current Outpatient Prescriptions  Medication Sig Dispense Refill  . amLODipine (NORVASC) 5 MG tablet Take 5 mg by mouth daily.    . ARIPiprazole (ABILIFY) 10 MG tablet Take 10 mg by mouth at bedtime.    . calcium carbonate (TUMS - DOSED IN MG ELEMENTAL CALCIUM) 500 MG chewable tablet Chew 1 tablet by mouth 2 (two) times daily.    . Cholecalciferol (VITAMIN D) 400 UNITS capsule Take 400 Units by mouth daily.    Marland Kitchen Fish Oil-Cholecalciferol (FISH OIL + D3 PO) Take 1,200 mg by mouth 2 (two) times daily.     . furosemide (LASIX) 40 MG tablet Take 1 tablet (40 mg total) by mouth daily as needed. 30 tablet 0  . HYDROcodone-acetaminophen (LORTAB) 5-325 MG per tablet Take 1 tablet by mouth every 6 (six) hours as needed for moderate pain. 60 tablet 0  . lamoTRIgine (LAMICTAL) 200 MG tablet Take 200 mg by mouth daily.     Marland Kitchen losartan (COZAAR) 100 MG tablet Take 100 mg by mouth daily.    . meloxicam (MOBIC) 15 MG tablet Take 15 mg by mouth daily.    . metoprolol tartrate (LOPRESSOR) 25 MG tablet Take 12.5 mg by mouth 2 (two) times daily.     Marland Kitchen omeprazole (PRILOSEC) 20 MG capsule     . omeprazole (PRILOSEC) 40 MG capsule Take 1  capsule (40 mg total) by mouth  daily. (Patient taking differently: Take 20 mg by mouth 2 (two) times daily. ) 90 capsule 3  . ondansetron (ZOFRAN) 4 MG tablet TAKE 1 TABLET BY MOUTH EVERY 8 HOURS AS NEEDED FOR NAUSEA AND VOMITING 30 tablet 0  . OVER THE COUNTER MEDICATION Fiber well - 5 grams 1 chewable tablet twice daily.    . polyethylene glycol powder (GLYCOLAX/MIRALAX) powder TAKE 8.5 GRAMS(1/2 CAPFUL) MIXED IN FLUID AND DRANK EVERY OTHER DAY 1581 g 3  . polyethylene glycol powder (MIRALAX) powder Take 8.5 g by mouth daily as needed. PRN per patient 527 g 3  . potassium chloride (K-DUR) 10 MEQ tablet Take 1 tablet (10 mEq total) by mouth daily as needed. 30 tablet 0  . simvastatin (ZOCOR) 20 MG tablet Take 20 mg by mouth every evening.     . vitamin B-12 (CYANOCOBALAMIN) 1000 MCG tablet Take 1,000 mcg by mouth daily.    Marland Kitchen zolpidem (AMBIEN) 10 MG tablet Take 1 tablet (10 mg total) by mouth at bedtime. 90 tablet 2   No current facility-administered medications for this visit.    Previous Psychotropic Medications: Yes   Substance Abuse History in the last 12 months:  No.  Consequences of Substance Abuse: NA  Medical Decision Making:  Established Problem, Stable/Improving (1), Review or order clinical lab tests (1), Decision to obtain old records (1), Review and summation of old records (2) and Review of Medication Regimen & Side Effects (2)  Treatment Plan Summary: Medication management  The patient seems to be stable on her current combination of Abilify and Lamictal for bipolar disorder. These medications will be continued. The Ambien 10 mg at bedtime helps to some degree for insomnia but is not altogether treated. I've encouraged her to add melatonin at bedtime and also to get 20 minutes of exercise every day. She'll return to see me in 3 months but call if any of her symptoms worsen.    [place], Gavin Pound 6/29/20163:05 PM

## 2014-09-14 ENCOUNTER — Encounter (INDEPENDENT_AMBULATORY_CARE_PROVIDER_SITE_OTHER): Payer: Self-pay | Admitting: Internal Medicine

## 2014-09-14 ENCOUNTER — Ambulatory Visit (INDEPENDENT_AMBULATORY_CARE_PROVIDER_SITE_OTHER): Payer: Medicare Other | Admitting: Internal Medicine

## 2014-09-14 VITALS — BP 120/82 | HR 74 | Temp 97.6°F | Resp 18 | Ht 64.0 in | Wt 173.6 lb

## 2014-09-14 DIAGNOSIS — R110 Nausea: Secondary | ICD-10-CM

## 2014-09-14 DIAGNOSIS — R748 Abnormal levels of other serum enzymes: Secondary | ICD-10-CM

## 2014-09-14 DIAGNOSIS — R531 Weakness: Secondary | ICD-10-CM

## 2014-09-14 NOTE — Patient Instructions (Signed)
Increase physical activity as discussed. Physician will call with results of blood tests. Please keep symptom diary and also check pulse and blood pressure then you're having these spells of nausea chills and weakness.Lindsay Palmer

## 2014-09-14 NOTE — Progress Notes (Signed)
Presenting complaint;  Recurrent spells of nausea weakness and chills.  Subjective:  Patient is 74 year old Caucasian female who is here for scheduled visit. She was last seen on 05/19/2014. She is accompanied by her husband Mellody Dance. 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 She continues to complain of episodic nausea chills and weakness. Her husband states that lately she's had more spells and the spells seem to last longer. She had one spell which lasted 9 days. She did not experience vomiting fever or upper abdominal pain. She has had intermittent pain across lower abdomen but it does not last long. Her bowels move daily or every other day. She denies melena or rectal bleeding. These attacks are not associated with skin rash diaphoreses or shortness of breath. Her appetite is fair and she has not lost any weight. Patient states she went with her sister for 5 or 6 days and had no symptoms whatsoever. She does usual household work but does not do any regular exercise. She has not taken pain medication in several weeks. She states she has been same dose of Abilify and Lamictal over the last few years.   Current Medications: Outpatient Encounter Prescriptions as of 09/14/2014  Medication Sig  . amLODipine (NORVASC) 5 MG tablet Take 5 mg by mouth daily.  . ARIPiprazole (ABILIFY) 10 MG tablet Take 10 mg by mouth at bedtime.  . calcium carbonate (TUMS - DOSED IN MG ELEMENTAL CALCIUM) 500 MG chewable tablet Chew 1 tablet by mouth 2 (two) times daily.  . Cholecalciferol (VITAMIN D) 400 UNITS capsule Take 400 Units by mouth daily.  Marland Kitchen Fish Oil-Cholecalciferol (FISH OIL + D3 PO) Take 1,200 mg by mouth 2 (two) times daily.   . furosemide (LASIX) 40 MG tablet Take 1 tablet (40 mg total) by mouth daily as needed.  Marland Kitchen HYDROcodone-acetaminophen (LORTAB) 5-325 MG per tablet Take 1 tablet by mouth every 6 (six) hours as  needed for moderate pain.  Marland Kitchen lamoTRIgine (LAMICTAL) 200 MG tablet Take 200 mg by mouth daily.   Marland Kitchen losartan (COZAAR) 100 MG tablet Take 100 mg by mouth daily.  . meloxicam (MOBIC) 15 MG tablet Take 15 mg by mouth daily.  . metoprolol tartrate (LOPRESSOR) 25 MG tablet Take 12.5 mg by mouth 2 (two) times daily.   Marland Kitchen omeprazole (PRILOSEC) 20 MG capsule   . omeprazole (PRILOSEC) 40 MG capsule Take 1 capsule (40 mg total) by mouth daily. (Patient taking differently: Take 20 mg by mouth 2 (two) times daily. )  . ondansetron (ZOFRAN) 4 MG tablet TAKE 1 TABLET BY MOUTH EVERY 8 HOURS AS NEEDED FOR NAUSEA AND VOMITING  . OVER THE COUNTER MEDICATION Fiber well - 5 grams 1 chewable tablet twice daily.  . polyethylene glycol powder (GLYCOLAX/MIRALAX) powder TAKE 8.5 GRAMS(1/2 CAPFUL) MIXED IN FLUID AND DRANK EVERY OTHER DAY  . potassium chloride (K-DUR) 10 MEQ tablet Take 1 tablet (10 mEq total) by mouth daily as needed.  . simvastatin (ZOCOR) 20 MG tablet Take 20 mg by mouth every evening.   . vitamin B-12 (CYANOCOBALAMIN) 1000 MCG tablet Take 1,000 mcg by mouth daily.  Marland Kitchen zolpidem (AMBIEN) 10 MG tablet Take 1 tablet (10 mg total) by mouth at bedtime.  . [DISCONTINUED] polyethylene glycol powder (MIRALAX) powder Take 8.5 g by mouth daily as needed. PRN per patient (Patient not taking: Reported on 09/14/2014)   No facility-administered encounter medications on file as of 09/14/2014.  Objective: Blood pressure 120/82, pulse 74, temperature 97.6 F (36.4 C), temperature source Oral, resp. rate 18, height  (1.626 m), weight 173 lb 9.6 oz (78.744 kg). Patient is alert and in no acute distress. Conjunctiva is pink. Sclera is nonicteric Oropharyngeal mucosa is normal. No neck masses or thyromegaly noted. Cardiac exam with regular rhythm normal S1 and S2. No murmur or gallop noted. Lungs are clear to auscultation. Abdomen is full. Bowel sounds are normal. On palpation abdomen is soft and nontender  without organomegaly or masses. No LE edema or clubbing noted. She has slight tremors to both hands slightly more pronounced on the left side.  Labs/studies Results: LFTs from 05/19/2014 Bilirubin 0.3, AP 141, AST 19, ALT 18 and albumin 4.1.    Assessment:  #1. Episodic nausea with chills and weakness do not appear to be due to cholangitis. It is unclear as to the etiology of these symptoms. Differential diagnosis includes side effect to one of her medications or allergy. Thyroid disease needs to be ruled out. #2. History of benign CBD stricture treated with restenting 14 months ago. She developed post-ERCP pancreatitis with pseudocyst formation and appears to have fully recovered #3. Mildly elevated alkaline phosphatase with normal transaminases most likely due to medications.   Plan:  Patient will go to the lab for CBC with differential LFTs, sedimentation rate and TSH. Patient advised to check her vitals when she has the spells and also when she is feeling normal and she will also keep symptom diary for the next few weeks. Patient encouraged to increase physical activity and she should try to exercise at least 3-4 times a week. She has stationary bike and she'll start using it regularly.

## 2014-09-15 LAB — CBC WITH DIFFERENTIAL/PLATELET
BASOS PCT: 0 % (ref 0–1)
Basophils Absolute: 0 10*3/uL (ref 0.0–0.1)
EOS ABS: 0.1 10*3/uL (ref 0.0–0.7)
EOS PCT: 1 % (ref 0–5)
HCT: 37.4 % (ref 36.0–46.0)
Hemoglobin: 12.3 g/dL (ref 12.0–15.0)
Lymphocytes Relative: 21 % (ref 12–46)
Lymphs Abs: 1.4 10*3/uL (ref 0.7–4.0)
MCH: 28.3 pg (ref 26.0–34.0)
MCHC: 32.9 g/dL (ref 30.0–36.0)
MCV: 86 fL (ref 78.0–100.0)
MONO ABS: 0.7 10*3/uL (ref 0.1–1.0)
MONOS PCT: 11 % (ref 3–12)
MPV: 10.7 fL (ref 8.6–12.4)
NEUTROS ABS: 4.5 10*3/uL (ref 1.7–7.7)
Neutrophils Relative %: 67 % (ref 43–77)
PLATELETS: 231 10*3/uL (ref 150–400)
RBC: 4.35 MIL/uL (ref 3.87–5.11)
RDW: 14.7 % (ref 11.5–15.5)
WBC: 6.7 10*3/uL (ref 4.0–10.5)

## 2014-09-15 LAB — HEPATIC FUNCTION PANEL
ALK PHOS: 133 U/L — AB (ref 33–130)
ALT: 24 U/L (ref 6–29)
AST: 23 U/L (ref 10–35)
Albumin: 4 g/dL (ref 3.6–5.1)
BILIRUBIN DIRECT: 0.1 mg/dL (ref <=0.2)
BILIRUBIN INDIRECT: 0.2 mg/dL (ref 0.2–1.2)
Total Bilirubin: 0.3 mg/dL (ref 0.2–1.2)
Total Protein: 7.2 g/dL (ref 6.1–8.1)

## 2014-09-15 LAB — SEDIMENTATION RATE: SED RATE: 42 mm/h — AB (ref 0–30)

## 2014-09-15 LAB — TSH: TSH: 1.213 u[IU]/mL (ref 0.350–4.500)

## 2014-10-07 ENCOUNTER — Other Ambulatory Visit (INDEPENDENT_AMBULATORY_CARE_PROVIDER_SITE_OTHER): Payer: Self-pay | Admitting: Internal Medicine

## 2014-10-09 ENCOUNTER — Telehealth (INDEPENDENT_AMBULATORY_CARE_PROVIDER_SITE_OTHER): Payer: Self-pay | Admitting: *Deleted

## 2014-10-09 NOTE — Telephone Encounter (Signed)
A PA was completed for the Ondansetron 4 mg. The Confirmation # is [id number], and it is approved for 1 year. [facility] in [place] was called and given the information.

## 2014-10-20 ENCOUNTER — Other Ambulatory Visit (INDEPENDENT_AMBULATORY_CARE_PROVIDER_SITE_OTHER): Payer: Self-pay | Admitting: Internal Medicine

## 2014-10-29 ENCOUNTER — Ambulatory Visit (HOSPITAL_COMMUNITY): Payer: Self-pay | Admitting: Psychiatry

## 2014-10-31 IMAGING — CR DG ABDOMEN 2V
2 series · 2 of 2 positions shown · non-contrast
Comparison: CT 08/21/2013

CLINICAL DATA: Constipation.

EXAM:
ABDOMEN - 2 VIEW

[view not recorded (1 of 2)]
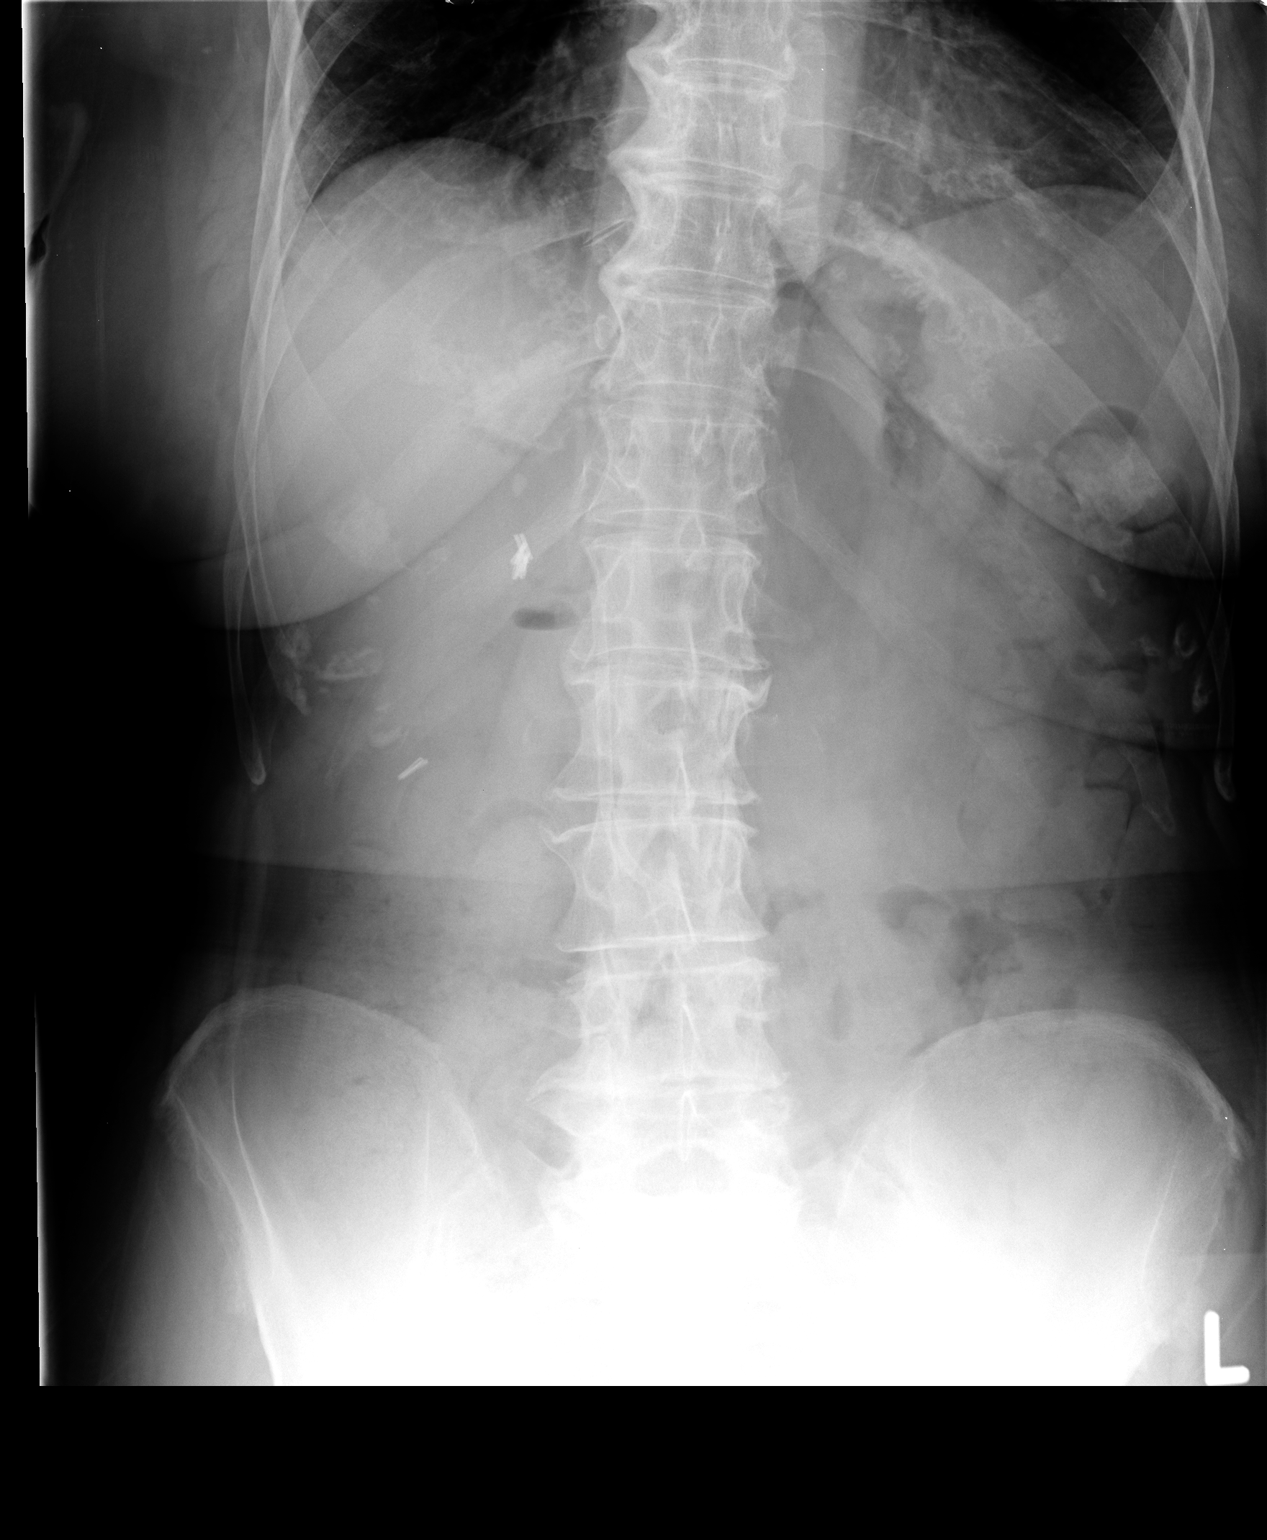

[view not recorded (2 of 2)]
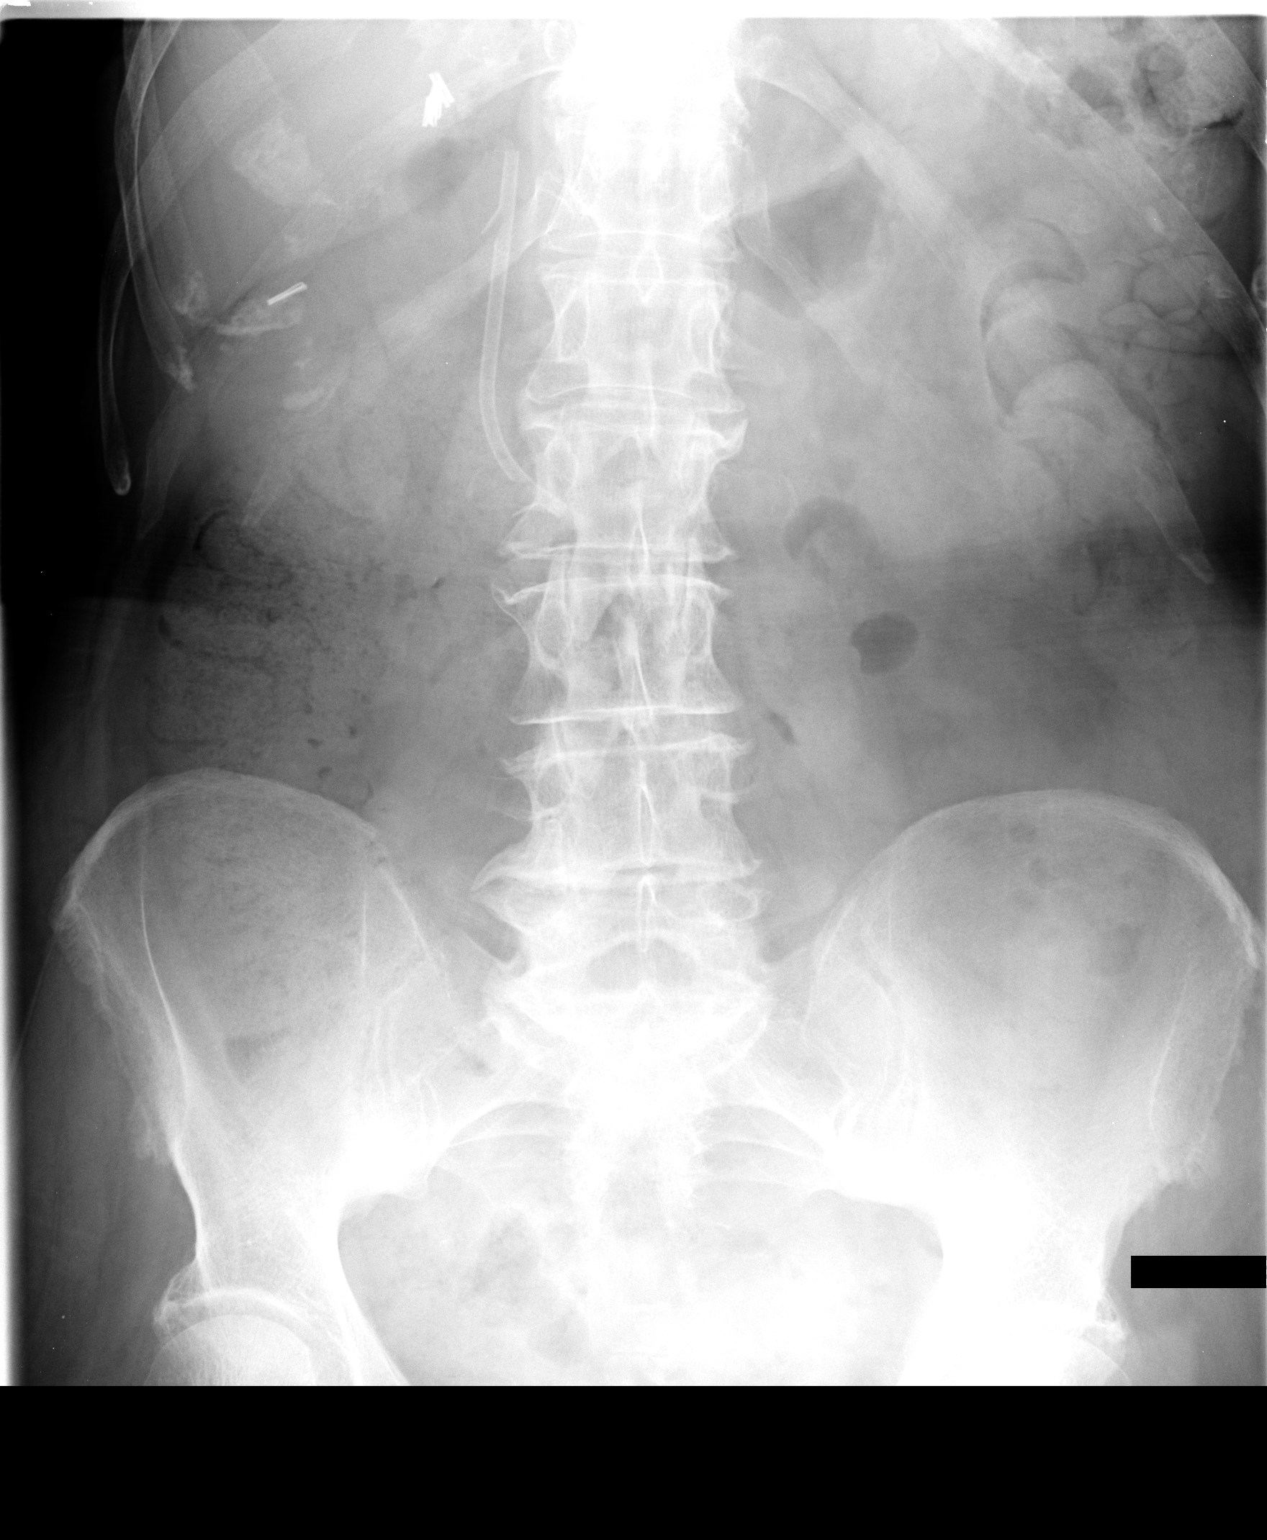

[2 of 2 positions shown; findings below may reference images not displayed]

FINDINGS: Nonobstructive bowel gas pattern. Moderate to large stool burden
throughout the colon. No free air organomegaly. Prior
cholecystectomy. Biliary stent is in place. Small amount of
pneumobilia noted.

No acute bony abnormality.
IMPRESSION: Moderate to large amount of fecal matter within the colon compatible
with constipation.

Biliary stent in expected position.  Small amount of pneumobilia.

## 2014-11-01 IMAGING — CR DG COLON W/ WATER SOL CM
20 of 24 series · 20 of 24 positions shown · non-contrast
Comparison: None.

CLINICAL DATA: Constipation.  Rule out obstruction.

EXAM:
COLON WITH WATER SOLUTION CONTRAST

[run (1 of 20)]
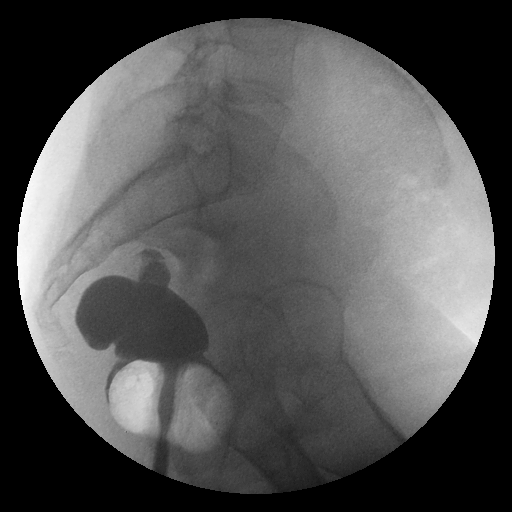

[run (2 of 20)]
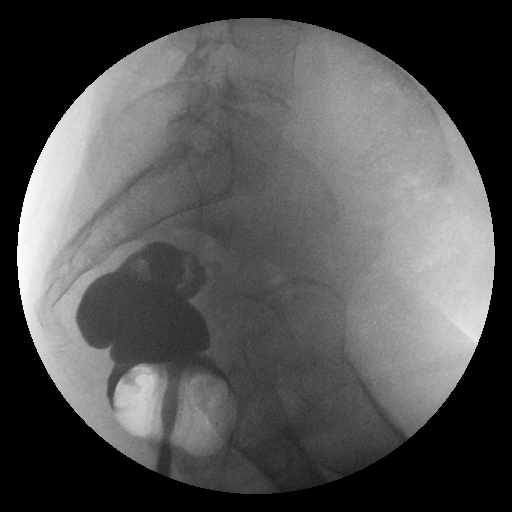

[run (3 of 20)]
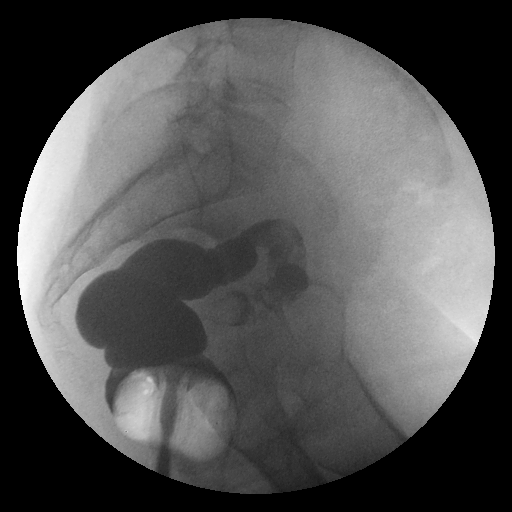

[run (4 of 20)]
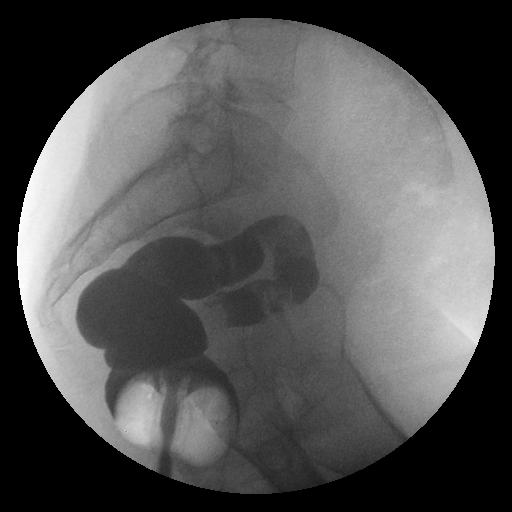

[run (5 of 20)]
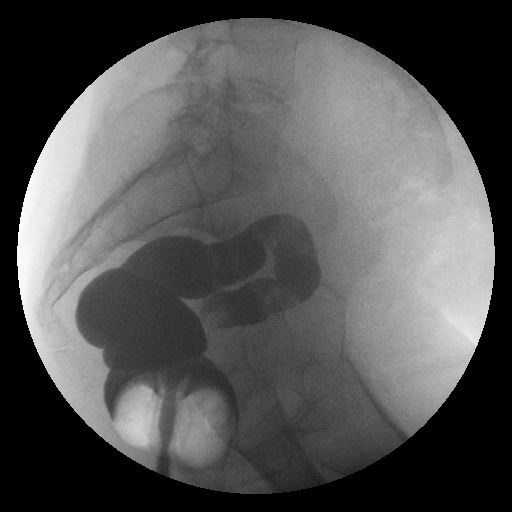

[run (6 of 20)]
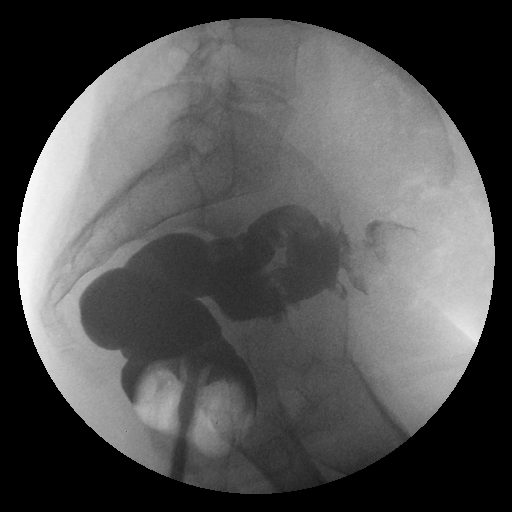

[run (7 of 20)]
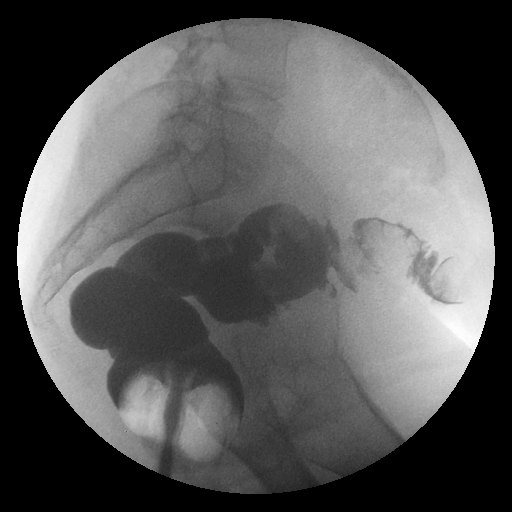

[run (8 of 20)]
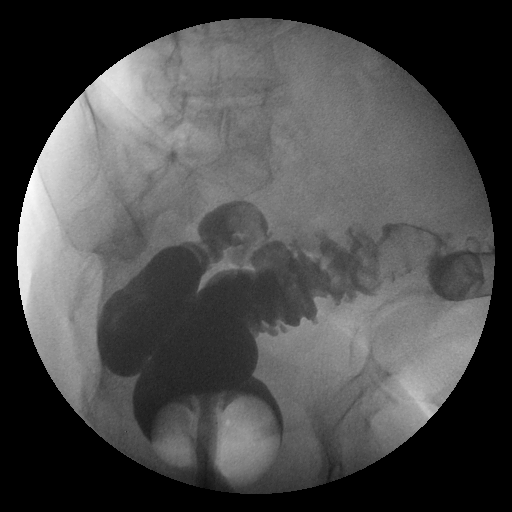

[run (9 of 20)]
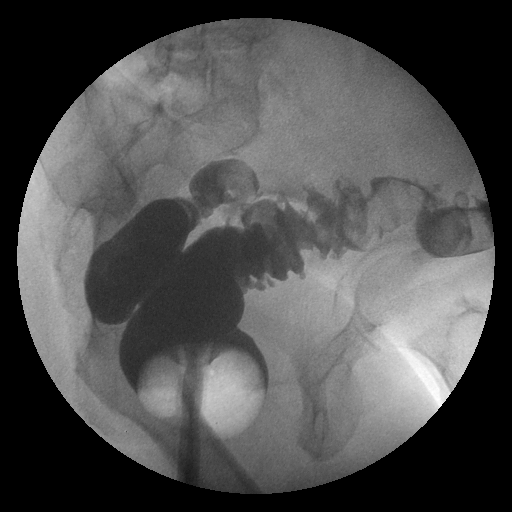

[run (10 of 20)]
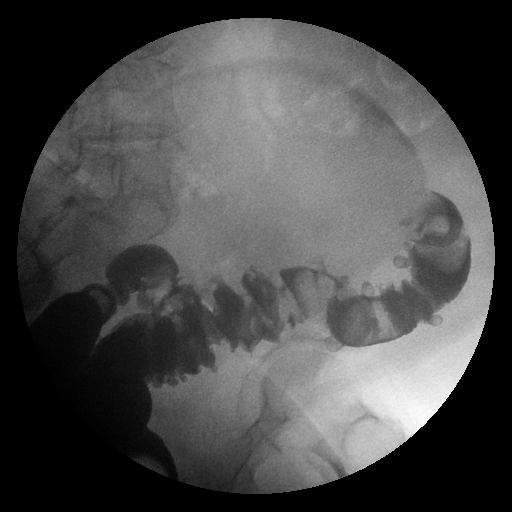

[run (11 of 20)]
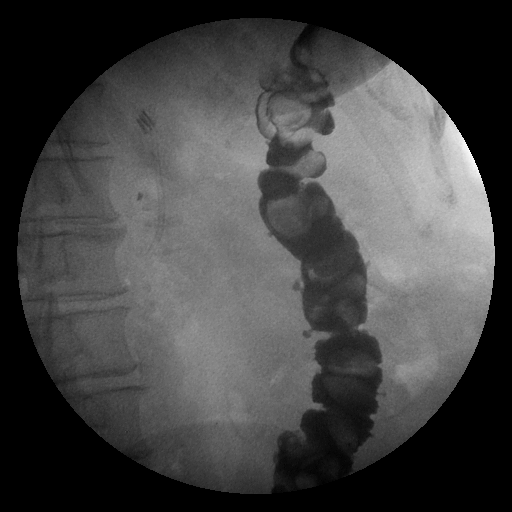

[run (12 of 20)]
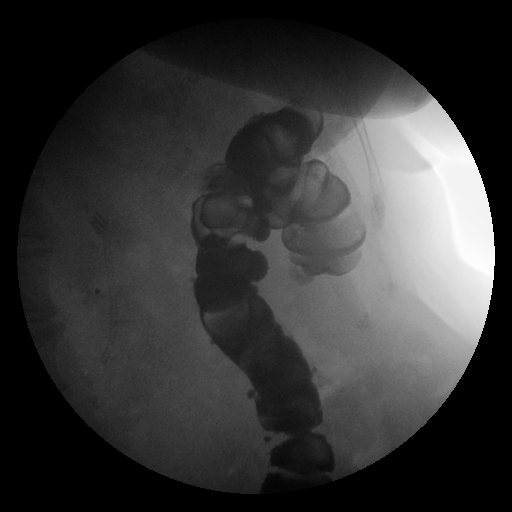

[run (13 of 20)]
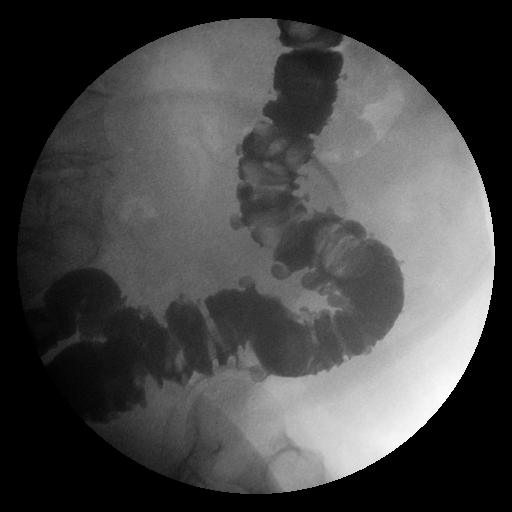

[run (14 of 20)]
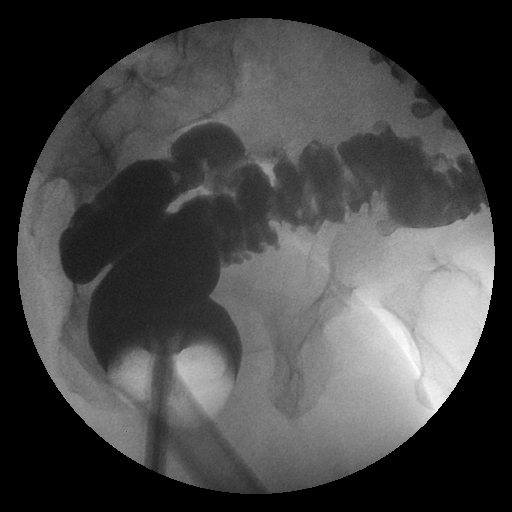

[run (15 of 20)]
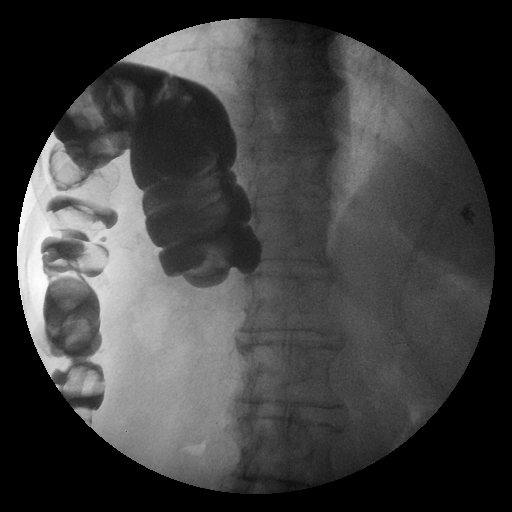

[run (16 of 20)]
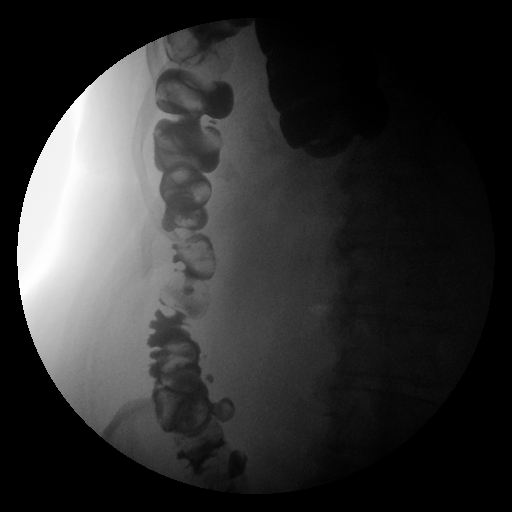

[run (17 of 20)]
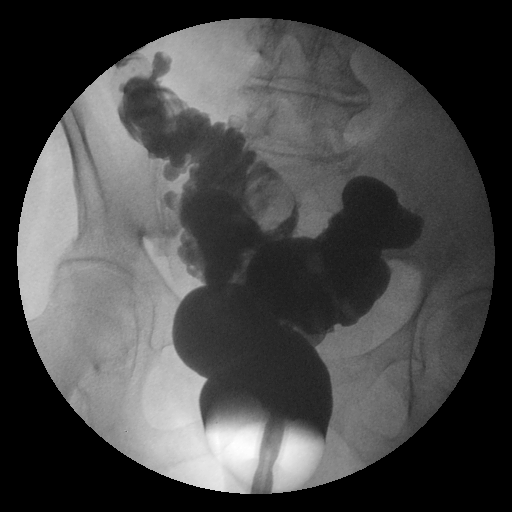

[run (18 of 20)]
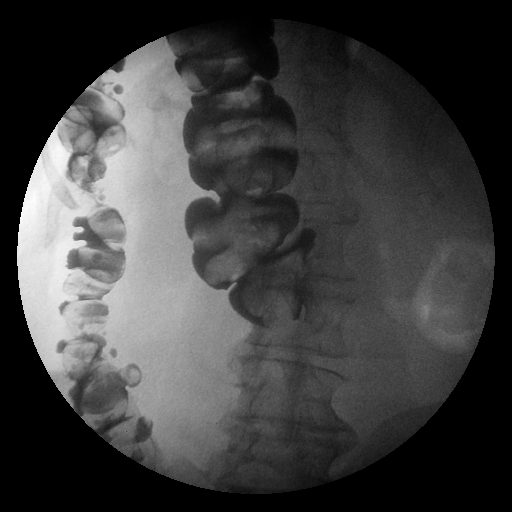

[run (19 of 20)]
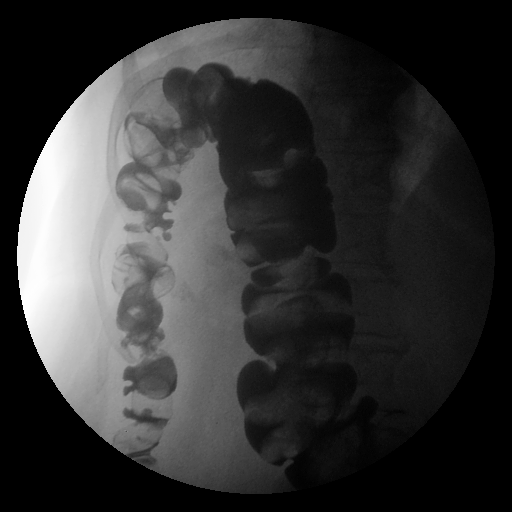

[run (20 of 20)]
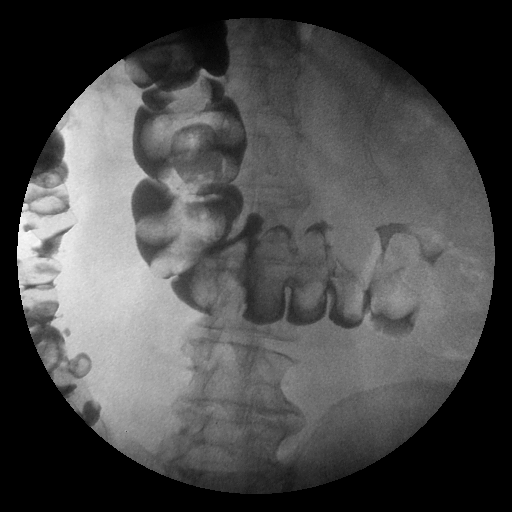

[20 of 24 positions shown; findings below may reference images not displayed]

FINDINGS: Scout film demonstrates moderate to large stool burden throughout
the colon. Biliary stent in place.

Unprepped water-soluble contrast enema demonstrates moderate
descending colonic and sigmoid diverticulosis. No obstruction.
Moderate to large stool burden throughout the colon.
IMPRESSION: No evidence of colonic obstruction.

Descending colonic and sigmoid diverticulosis.

## 2014-11-12 ENCOUNTER — Ambulatory Visit (INDEPENDENT_AMBULATORY_CARE_PROVIDER_SITE_OTHER): Payer: Medicare Other | Admitting: Psychiatry

## 2014-11-12 ENCOUNTER — Encounter (HOSPITAL_COMMUNITY): Payer: Self-pay | Admitting: Psychiatry

## 2014-11-12 VITALS — BP 135/54 | HR 61 | Ht 64.0 in | Wt 173.4 lb

## 2014-11-12 DIAGNOSIS — F3130 Bipolar disorder, current episode depressed, mild or moderate severity, unspecified: Secondary | ICD-10-CM

## 2014-11-12 MED ORDER — ZOLPIDEM TARTRATE 10 MG PO TABS: 10.0000 mg | ORAL_TABLET | Freq: Every day | ORAL | Status: DC

## 2014-11-12 MED ORDER — ARIPIPRAZOLE 10 MG PO TABS: 10.0000 mg | ORAL_TABLET | Freq: Every day | ORAL | Status: DC

## 2014-11-12 MED ORDER — LAMOTRIGINE 200 MG PO TABS: 200.0000 mg | ORAL_TABLET | Freq: Every day | ORAL | Status: DC

## 2014-11-12 NOTE — Progress Notes (Signed)
Patient ID: Lindsay Palmer, female   DOB: August 19, 1940, 74 y.o.   MRN: [id number]  Psychiatric  Adult follow-up  Patient Identification: Lindsay Palmer MRN:  [id number] Date of Evaluation:  11/12/2014 Referral Source: [facility] Chief Complaint:   Chief Complaint    Depression; Manic Behavior; Follow-up     Visit Diagnosis:    ICD-9-CM ICD-10-CM   1. Bipolar I disorder, most recent episode depressed (HCC) 296.50 F31.30    Diagnosis:   Patient Active Problem List   Diagnosis Date Noted  . Bipolar I disorder, most recent episode depressed (HCC) [F31.30] 07/29/2014  . Unspecified constipation [K59.00] 09/05/2013  . 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 Anemia [D64.9] 07/23/2013  . Drug-induced pancreatitis DUE TO CONTRAST [K85.3] 07/19/2013  . Benign essential HTN [I10] 07/19/2013  . Urinary retention [R33.9] 07/19/2013  . Acute pancreatitis [K85.90] 07/19/2013  . Abdominal pain [R10.9] 07/10/2013   History of Present Illness:  This patient is a 74 year old married white female who lives with her husband in [place]. She has a son, daughter and 3 grandchildren. She was working in [employer] prior to her retirement.  The patient was referred by [facility] where she had been receiving treatment. Her physician recently left the practice and they are no longer taking her insurance. She has a long-term history of bipolar disorder.  The patient presents with her husband today. They have been married since she was 74 years old. He states that for much of their marriage she had bouts of severe mood swings. At times she was pleasant and easy to be with the other time she was angry irritable and out of control. She also went through periods of euphoria and spending a lot of money. At one point they went to a family counselor who suspected she had bipolar disorder. She was eventually referred here to this office and saw Dr. Lolly Mustache. He started her on Abilify and it  really helped in terms of decreasing the anger and irritability. Eventually however she started seeing Dr. Tomasa Rand at [facility] because she was still having difficulties with being depressed. He added Lamictal and this seemed to stabilize her mood and she was no longer depressed or manic. The patient has been on this combination of medicines for several years and she seems very stable now. Her only difficulty is that she doesn't sleep well even with the addition of Ambien 10 mg at bedtime.  At times the patient sleeps up to 5 hours at night and sometimes only 2 hours. She's tried almost every medication there is for sleep. Her mood is generally good. However she is often tired in her energy is variable. She had a history of pancreatitis last year after having ERCP and the contrast dye caused the pancreatitis. She still has bouts of nausea and weakness. She's been followed by GI in her laboratories are normal. Her husband thinks she spends too much time in bed. She spends most of her time doing housework. She is stressed because her son and her husband are not getting along . her son is on disability due to a back injury but is trying to work doing Aeronautical engineer which worries her  The patient has never had a psychiatric hospitalization. She does not use drugs or alcohol and has never had any psychotic symptoms such as auditory or visual hallucinations or paranoia  The patient and her husband return after 3 months. For the most part she's been doing well. However she still has nausea and  chills and abdominal pain. She is seeing a GI specialist here but nothing new was being tried because her lab work is trending towards normal other than an elevated sedimentation rate of 42. She has not had fever. Her mood has been stable but she still doesn't sleep all that well with the Ambien and often wakes up at night for 3 or 4 hours. I told her we could try Belsomra  which is a newer medicine Elements:   Location:  Global. Quality:  Waxing and waning. Severity:  Low at this point, well controlled on medications. Timing:  Began when she was around 74 years old. Duration:  Years. Context:  Probably biological. Associated Signs/Symptoms: Depression Symptoms:  depressed mood, insomnia, psychomotor retardation, anxiety, disturbed sleep, (Hypo) Manic Symptoms:  Impulsivity, Irritable Mood,   Past Medical History:  Past Medical History  Diagnosis Date  . GERD (gastroesophageal reflux disease)   . Bipolar 2 disorder (HCC)   . Major depressive disorder (HCC)   . Chronic insomnia   . Osteopenia   . Vitamin D deficiency   . Migraine   . Elevated LFTs   . Diverticulosis of colon   . H/O first degree atrioventricular block   . Fatty liver disease, nonalcoholic   . Hypertension   . Hypercholesteremia   . Sleep apnea     Stop Bang score of 4. Pt said she had sleep apnea at one time, but she has had another sleep study since then and they said she does not have it anymore.  . Pancreatitis     Past Surgical History  Procedure Laterality Date  . Neck surgery      Stenosis C6-C7  . Cholecystectomy    . Cataract surgery Bilateral 08-2012  . Colonoscopy  2003    Diverticulitis h/o polyps -due next 2015 -gastroscopy 2003  . Sigmoidoscopy  2007    no polyps  . Ercp    . Ercp N/A 07/18/2013    Procedure: ENDOSCOPIC RETROGRADE CHOLANGIOPANCREATOGRAPHY (ERCP) COMMON BILE DUCT BRUSHING;  Surgeon: Malissa Hippo, MD;  Location: [facility];  Service: Endoscopy;  Laterality: N/A;  . Sphincterotomy N/A 07/18/2013    Procedure: SPHINCTEROTOMY;  Surgeon: Malissa Hippo, MD;  Location: [facility];  Service: Endoscopy;  Laterality: N/A;  . Biliary stent placement N/A 07/18/2013    Procedure: BILIARY STENT PLACEMENT;  Surgeon: Malissa Hippo, MD;  Location: [facility];  Service: Endoscopy;  Laterality: N/A;  . Eye surgery Bilateral     cataract removal  . Ercp N/A 11/07/2013    Procedure: ENDOSCOPIC  RETROGRADE CHOLANGIOPANCREATOGRAPHY (ERCP);  Surgeon: Malissa Hippo, MD;  Location: [facility];  Service: Endoscopy;  Laterality: N/A;  . Stent removal N/A 11/07/2013    Procedure: STENT REMOVAL;  Surgeon: Malissa Hippo, MD;  Location: [facility];  Service: Endoscopy;  Laterality: N/A;  . Sphincterotomy  11/07/2013    Procedure: EXTENSION OF SPHINCTEROTOMY;  Surgeon: Malissa Hippo, MD;  Location: [facility];  Service: Endoscopy;;   Family History:  Family History  Problem Relation Age of Onset  . Diabetes Mother   . Coronary artery disease Father   . Gout Father   . Breast cancer Sister   . Pancreatic cancer Brother   . Alcohol abuse Brother   . Healthy Brother   . Lung cancer Sister   . Healthy Sister   . Depression Sister   . Hyperlipidemia Daughter   . Hyperlipidemia Son   . Bipolar disorder Son   . Healthy  Sister   . Healthy Sister   . Seizures Other    Social History:   Social History   Social History  . Marital Status: Married    Spouse Name: N/A  . Number of Children: N/A  . Years of Education: N/A   Social History Main Topics  . Smoking status: Never Smoker   . Smokeless tobacco: Never Used  . Alcohol Use: No  . Drug Use: No  . Sexual Activity: Not Asked   Other Topics Concern  . None   Social History Narrative   Additional Social History: The patient grew up in [place] with both parents. Her parents argued a lot but there was no domestic violence. The patient completed high school but married very young. 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 She denies any history of abuse. She has few interests or hobbies and does not get any exercise  Musculoskeletal: Strength & Muscle Tone: within normal limits Gait & Station: normal Patient leans: N/A  Psychiatric Specialty Exam: Depression        Associated symptoms include insomnia.   Review of Systems  Gastrointestinal: Positive for abdominal  pain.  Psychiatric/Behavioral: Positive for depression. The patient has insomnia.   All other systems reviewed and are negative.   Blood pressure 135/54, pulse 61, height 5\' 4"  (1.626 m), weight 173 lb 6.4 oz (78.654 kg), SpO2 96 %.Body mass index is 29.75 kg/(m^2).  General Appearance: Casual, Neat and Well Groomed  Eye Contact:  Good  Speech:  Clear and Coherent  Volume:  Normal  Mood:  Euthymic  Affect:  Appropriate and Flat  Thought Process:  Goal Directed  Orientation:  Full (Time, Place, and Person)  Thought Content:  WDL  Suicidal Thoughts:  No  Homicidal Thoughts:  No  Memory:  Immediate;   Fair Recent;   Fair Remote;   Fair  Judgement:  Good  Insight:  Good  Psychomotor Activity:  Normal  Concentration:  Fair  Recall:  Fair  Fund of Knowledge:Good  Language: Good  Akathisia:  No  Handed:  Right  AIMS (if indicated):    Assets:  Communication Skills Desire for Improvement Leisure Time Resilience Social Support  ADL's:  Intact  Cognition: WNL  Sleep:  Irritable but not as good as it should be    Is the patient at risk to self?  No. Has the patient been a risk to self in the past 6 months?  No. Has the patient been a risk to self within the distant past?  No. Is the patient a risk to others?  No. Has the patient been a risk to others in the past 6 months?  No. Has the patient been a risk to others within the distant past?  No.  Allergies:   Allergies  Allergen Reactions  . Codeine Nausea And Vomiting  . Oxycodone    Current Medications: Current Outpatient Prescriptions  Medication Sig Dispense Refill  . amLODipine (NORVASC) 5 MG tablet Take 5 mg by mouth daily.    . ARIPiprazole (ABILIFY) 10 MG tablet Take 1 tablet (10 mg total) by mouth at bedtime. 100 tablet 2  . calcium carbonate (TUMS - DOSED IN MG ELEMENTAL CALCIUM) 500 MG chewable tablet Chew 1 tablet by mouth 2 (two) times daily.    . Cholecalciferol (VITAMIN D) 400 UNITS capsule Take 400 Units by  mouth daily.    Marland Kitchen. Fish Oil-Cholecalciferol (FISH  OIL + D3 PO) Take 1,200 mg by mouth 2 (two) times daily.     . furosemide (LASIX) 40 MG tablet Take 1 tablet (40 mg total) by mouth daily as needed. 30 tablet 0  . HYDROcodone-acetaminophen (LORTAB) 5-325 MG per tablet Take 1 tablet by mouth every 6 (six) hours as needed for moderate pain. 60 tablet 0  . lamoTRIgine (LAMICTAL) 200 MG tablet Take 1 tablet (200 mg total) by mouth daily. 90 tablet 2  . losartan (COZAAR) 100 MG tablet Take 100 mg by mouth daily.    . meloxicam (MOBIC) 15 MG tablet Take 15 mg by mouth daily.    . metoprolol tartrate (LOPRESSOR) 25 MG tablet Take 12.5 mg by mouth 2 (two) times daily.     Marland Kitchen omeprazole (PRILOSEC) 20 MG capsule Take 20 mg by mouth 2 (two) times daily before a meal.     . omeprazole (PRILOSEC) 20 MG capsule TAKE 2 CAPSULES BY MOUTH EVERY DAY 180 capsule 3  . ondansetron (ZOFRAN) 4 MG tablet TAKE 1 TABLET BY MOUTH EVERY 8 HOURS AS NEEDED FOR NAUSEA OR VOMITING 30 tablet 0  . OVER THE COUNTER MEDICATION Fiber well - 5 grams 1 chewable tablet twice daily.    . polyethylene glycol powder (GLYCOLAX/MIRALAX) powder TAKE 8.5 GRAMS(1/2 CAPFUL) MIXED IN FLUID AND DRANK EVERY OTHER DAY 1581 g 3  . potassium chloride (K-DUR) 10 MEQ tablet TAKE 1 TABLET BY MOUTH EVERY DAY AS NEEDED 30 tablet 4  . simvastatin (ZOCOR) 20 MG tablet Take 20 mg by mouth every evening.     . vitamin B-12 (CYANOCOBALAMIN) 1000 MCG tablet Take 1,000 mcg by mouth daily.    Marland Kitchen zolpidem (AMBIEN) 10 MG tablet Take 1 tablet (10 mg total) by mouth at bedtime. 90 tablet 2   No current facility-administered medications for this visit.    Previous Psychotropic Medications: Yes   Substance Abuse History in the last 12 months:  No.  Consequences of Substance Abuse: NA  Medical Decision Making:  Established Problem, Stable/Improving (1), Review or order clinical lab tests (1), Decision to obtain old records (1), Review and summation of old records  (2) and Review of Medication Regimen & Side Effects (2)  Treatment Plan Summary: Medication management  The patient seems to be stable on her current combination of Abilify and Lamictal for bipolar disorder. These medications will be continued. The Ambien 10 mg at bedtime helps to some degree for insomnia but is not altogether treated. I have given her Norberto Sorenson Summerall 15 mg samples to try. She'll return to see me in 3 months but call if any of her symptoms worsen.    Keinan Brouillet, [facility] 10/13/20169:09 AM

## 2014-11-28 ENCOUNTER — Other Ambulatory Visit (INDEPENDENT_AMBULATORY_CARE_PROVIDER_SITE_OTHER): Payer: Self-pay | Admitting: Internal Medicine

## 2015-01-19 ENCOUNTER — Encounter (INDEPENDENT_AMBULATORY_CARE_PROVIDER_SITE_OTHER): Payer: Self-pay | Admitting: *Deleted

## 2015-01-19 ENCOUNTER — Encounter (INDEPENDENT_AMBULATORY_CARE_PROVIDER_SITE_OTHER): Payer: Self-pay | Admitting: Internal Medicine

## 2015-01-19 ENCOUNTER — Other Ambulatory Visit (INDEPENDENT_AMBULATORY_CARE_PROVIDER_SITE_OTHER): Payer: Self-pay | Admitting: Internal Medicine

## 2015-01-19 ENCOUNTER — Ambulatory Visit (INDEPENDENT_AMBULATORY_CARE_PROVIDER_SITE_OTHER): Payer: Medicare Other | Admitting: Internal Medicine

## 2015-01-19 VITALS — BP 130/70 | HR 68 | Temp 98.2°F | Resp 18 | Ht 64.0 in | Wt 177.0 lb

## 2015-01-19 DIAGNOSIS — R112 Nausea with vomiting, unspecified: Secondary | ICD-10-CM | POA: Diagnosis not present

## 2015-01-19 DIAGNOSIS — R1030 Lower abdominal pain, unspecified: Secondary | ICD-10-CM

## 2015-01-19 NOTE — Progress Notes (Signed)
Presenting complaint;  Follow-up for nausea vomiting and lower abdominal pain. History of CBD stricture and post ERCP pancreatitis(June 2015)  Subjective:  Patient is 74 year old Caucasian female who is here for scheduled visit accompanied by her husband. She was last seen on 09/14/2014. As discussed she has kept a diary of her symptoms on most days. She has intermittent nausea with chills. These symptoms tend to occur about once a week. However she went 3 weeks without any symptoms. Spells of nausea and chills at times associated with headache or weakness and she had 2 episodes of emesis. Her bowels are moving more or less regularly. She uses polyethylene glycol on as-needed basis. She is had sporadic lower abdominal pain for which she second main medication. When she has nausea she slacks off her eating and usually gets better within a couple of bars. Since blood pressure medication which was adjusted she has had less episodes of headache. Her temperature has always been normal when she's had these spells.   Current Medications: Outpatient Encounter Prescriptions as of 01/19/2015  Medication Sig  . amLODipine (NORVASC) 5 MG tablet Take 5 mg by mouth daily.  . ARIPiprazole (ABILIFY) 10 MG tablet Take 1 tablet (10 mg total) by mouth at bedtime.  . calcium carbonate (TUMS - DOSED IN MG ELEMENTAL CALCIUM) 500 MG chewable tablet Chew 1 tablet by mouth 2 (two) times daily.  . Cholecalciferol (VITAMIN D) 400 UNITS capsule Take 400 Units by mouth daily.  Marland Kitchen. Fish Oil-Cholecalciferol (FISH OIL + D3 PO) Take 1,200 mg by mouth 2 (two) times daily.   Marland Kitchen. FLUZONE HIGH-DOSE 0.5 ML SUSY ADM 0.5ML IM UTD  . furosemide (LASIX) 40 MG tablet TAKE 1 TABLET BY MOUTH EVERY DAY AS NEEDED  . HYDROcodone-acetaminophen (LORTAB) 5-325 MG per tablet Take 1 tablet by mouth every 6 (six) hours as needed for moderate pain.  Marland Kitchen. lamoTRIgine (LAMICTAL) 200 MG tablet Take 1 tablet (200 mg total) by mouth daily.  Marland Kitchen. losartan (COZAAR)  100 MG tablet Take 100 mg by mouth daily.  . meloxicam (MOBIC) 15 MG tablet Take 15 mg by mouth daily.  . metoprolol tartrate (LOPRESSOR) 25 MG tablet Take 12.5 mg by mouth 2 (two) times daily.   Marland Kitchen. omeprazole (PRILOSEC) 20 MG capsule TAKE 2 CAPSULES BY MOUTH EVERY DAY  . ondansetron (ZOFRAN) 4 MG tablet TAKE 1 TABLET BY MOUTH EVERY 8 HOURS AS NEEDED FOR NAUSEA OR VOMITING  . OVER THE COUNTER MEDICATION Fiber well - 5 grams 1 chewable tablet twice daily.  . polyethylene glycol powder (GLYCOLAX/MIRALAX) powder TAKE 8.5 GRAMS(1/2 CAPFUL) MIXED IN FLUID AND DRANK EVERY OTHER DAY  . potassium chloride (K-DUR) 10 MEQ tablet TAKE 1 TABLET BY MOUTH EVERY DAY AS NEEDED  . simvastatin (ZOCOR) 20 MG tablet Take 20 mg by mouth every evening.   . vitamin B-12 (CYANOCOBALAMIN) 1000 MCG tablet Take 1,000 mcg by mouth daily.  Marland Kitchen. zolpidem (AMBIEN) 10 MG tablet Take 1 tablet (10 mg total) by mouth at bedtime.  . [DISCONTINUED] omeprazole (PRILOSEC) 20 MG capsule Take 20 mg by mouth 2 (two) times daily before a meal. Reported on 01/19/2015   No facility-administered encounter medications on file as of 01/19/2015.    Objective: Blood pressure 130/70, pulse 68, temperature 98.2 F (36.8 C), temperature source Oral, resp. rate 18, height 5\' 4"  (1.626 m), weight 177 lb (80.287 kg). Patient is alert and in no acute distress. Conjunctiva is pink. Sclera is nonicteric Oropharyngeal mucosa is normal. No neck masses or thyromegaly  noted. Cardiac exam with regular rhythm normal S1 and S2. No murmur or gallop noted. Lungs are clear to auscultation. Abdomen is full. Bowel sounds are normal. On palpation abdomen is soft and nontender without organomegaly or masses. No LE edema or clubbing noted.  Labs/studies Results: LFTs from 09/14/2014  Bilirubin 0.3, AP 133, AST 23, ALT 24, total protein 7.2 and albumin 4.0.   Assessment:  #1. Frequent nausea with sporadic vomiting possibly due to multiple reasons. There does  not appear to be any pattern. Will rule out gastroparesis. #2. Intermittent lower abdominal pain possibly do to IBS given history of constipation. #3. History of CBD stricture treated with biliary stenting last year. LFTs 4 months ago were normal except borderline AP. She is due for blood work next month.    Plan:  Patient encouraged to incorporate regular exercise into her lifestyle. She has stationary bike at home which she can start using on regular basis. Solid-phase gastric emptying study. She will have CBC and comprehensive chemistry panel next month.

## 2015-01-19 NOTE — Patient Instructions (Signed)
Solid-phase gastric emptying study to be scheduled after the holidays. Try decreasing meloxicam dose to 7.5 mg daily if possible.

## 2015-02-02 ENCOUNTER — Encounter (HOSPITAL_COMMUNITY): Payer: Medicare Other

## 2015-02-11 ENCOUNTER — Encounter (HOSPITAL_COMMUNITY): Payer: Self-pay | Admitting: Psychiatry

## 2015-02-11 ENCOUNTER — Ambulatory Visit (INDEPENDENT_AMBULATORY_CARE_PROVIDER_SITE_OTHER): Payer: Medicare Other | Admitting: Psychiatry

## 2015-02-11 VITALS — BP 137/67 | HR 70 | Ht 64.0 in | Wt 178.8 lb

## 2015-02-11 DIAGNOSIS — F3130 Bipolar disorder, current episode depressed, mild or moderate severity, unspecified: Secondary | ICD-10-CM

## 2015-02-11 MED ORDER — LAMOTRIGINE 200 MG PO TABS: 200.0000 mg | ORAL_TABLET | Freq: Every day | ORAL | Status: DC

## 2015-02-11 MED ORDER — ZOLPIDEM TARTRATE ER 12.5 MG PO TBCR: 12.5000 mg | EXTENDED_RELEASE_TABLET | Freq: Every evening | ORAL | Status: DC | PRN

## 2015-02-11 MED ORDER — ARIPIPRAZOLE 10 MG PO TABS: 10.0000 mg | ORAL_TABLET | Freq: Every day | ORAL | Status: DC

## 2015-02-11 NOTE — Progress Notes (Signed)
Patient ID: Lindsay Palmer, female   DOB: 06-Nov-1940, 75 y.o.   MRN: [id number] Patient ID: Lindsay LinerHelen Palmer, female   DOB: 06-Nov-1940, 75 y.o.   MRN: [id number]  Psychiatric  Adult follow-up  Patient Identification: Lindsay Palmer MRN:  [id number] Date of Evaluation:  02/11/2015 Referral Source: [facility] Chief Complaint:   Chief Complaint    Manic Behavior; Depression; Follow-up     Visit Diagnosis:    ICD-9-CM ICD-10-CM   1. Bipolar I disorder, most recent episode depressed (HCC) 296.50 F31.30    Diagnosis:   Patient Active Problem List   Diagnosis Date Noted  . Bipolar I disorder, most recent episode depressed (HCC) [F31.30] 07/29/2014  . Unspecified constipation [K59.00] 09/05/2013  . 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 Anemia [D64.9] 07/23/2013  . Drug-induced pancreatitis DUE TO CONTRAST [K85.3] 07/19/2013  . Benign essential HTN [I10] 07/19/2013  . Urinary retention [R33.9] 07/19/2013  . Acute pancreatitis [K85.90] 07/19/2013  . Abdominal pain [R10.9] 07/10/2013   History of Present Illness:  This patient is a 75 year old married white female who lives with her husband in [place]. She has a son, daughter and 3 grandchildren. She was working in [employer] prior to her retirement.  The patient was referred by [facility] where she had been receiving treatment. Her physician recently left the practice and they are no longer taking her insurance. She has a long-term history of bipolar disorder.  The patient presents with her husband today. They have been married since she was 75 years old. He states that for much of their marriage she had bouts of severe mood swings. At times she was pleasant and easy to be with the other time she was angry irritable and out of control. She also went through periods of euphoria and spending a lot of money. At one point they went to a family counselor who suspected she had bipolar disorder. She was eventually  referred here to this office and saw Dr. Lolly MustacheArfeen. He started her on Abilify and it really helped in terms of decreasing the anger and irritability. Eventually however she started seeing Dr. Tomasa Randunningham at [facility] because she was still having difficulties with being depressed. He added Lamictal and this seemed to stabilize her mood and she was no longer depressed or manic. The patient has been on this combination of medicines for several years and she seems very stable now. Her only difficulty is that she doesn't sleep well even with the addition of Ambien 10 mg at bedtime.  At times the patient sleeps up to 5 hours at night and sometimes only 2 hours. She's tried almost every medication there is for sleep. Her mood is generally good. However she is often tired in her energy is variable. She had a history of pancreatitis last year after having ERCP and the contrast dye caused the pancreatitis. She still has bouts of nausea and weakness. She's been followed by GI in her laboratories are normal. Her husband thinks she spends too much time in bed. She spends most of her time doing housework. She is stressed because her son and her husband are not getting along . her son is on disability due to a back injury but is trying to work doing Aeronautical engineerlandscaping which worries her  The patient has never had a psychiatric hospitalization. She does not use drugs or alcohol and has never had any psychotic symptoms such as auditory or visual hallucinations or paranoia  The patient and her husband return after 3  months. For the most part she's been doing well. However she is not sleeping all that well. She goes to bed about 9:30 and then wakes up at 3 or 4 in the morning and can't get back to sleep. She states that her mood is good and her abdominal pain has gotten a lot better. She's supposed to have a gastric emptying study. She still not getting any exercise and explained that this would really help with her sleep. We  tried Belsomra but it made her feel sick. Ambien CR thing that helps her suggested we try the CR form and it may help her stay asleep longer. Elements:  Location:  Global. Quality:  Waxing and waning. Severity:  Low at this point, well controlled on medications. Timing:  Began when she was around 76 years old. Duration:  Years. Context:  Probably biological. Associated Signs/Symptoms: Depression Symptoms:  depressed mood, insomnia, psychomotor retardation, anxiety, disturbed sleep, (Hypo) Manic Symptoms:  Impulsivity, Irritable Mood,   Past Medical History:  Past Medical History  Diagnosis Date  . GERD (gastroesophageal reflux disease)   . Bipolar 2 disorder (HCC)   . Major depressive disorder (HCC)   . Chronic insomnia   . Osteopenia   . Vitamin D deficiency   . Migraine   . Elevated LFTs   . Diverticulosis of colon   . H/O first degree atrioventricular block   . Fatty liver disease, nonalcoholic   . Hypertension   . Hypercholesteremia   . Sleep apnea     Stop Bang score of 4. Pt said she had sleep apnea at one time, but she has had another sleep study since then and they said she does not have it anymore.  . Pancreatitis     Past Surgical History  Procedure Laterality Date  . Neck surgery      Stenosis C6-C7  . Cholecystectomy    . Cataract surgery Bilateral 08-2012  . Colonoscopy  2003    Diverticulitis h/o polyps -due next 2015 -gastroscopy 2003  . Sigmoidoscopy  2007    no polyps  . Ercp    . Ercp N/A 07/18/2013    Procedure: ENDOSCOPIC RETROGRADE CHOLANGIOPANCREATOGRAPHY (ERCP) COMMON BILE DUCT BRUSHING;  Surgeon: Malissa Hippo, MD;  Location: [facility];  Service: Endoscopy;  Laterality: N/A;  . Sphincterotomy N/A 07/18/2013    Procedure: SPHINCTEROTOMY;  Surgeon: Malissa Hippo, MD;  Location: [facility];  Service: Endoscopy;  Laterality: N/A;  . Biliary stent placement N/A 07/18/2013    Procedure: BILIARY STENT PLACEMENT;  Surgeon: Malissa Hippo, MD;  Location:  [facility];  Service: Endoscopy;  Laterality: N/A;  . Eye surgery Bilateral     cataract removal  . Ercp N/A 11/07/2013    Procedure: ENDOSCOPIC RETROGRADE CHOLANGIOPANCREATOGRAPHY (ERCP);  Surgeon: Malissa Hippo, MD;  Location: [facility];  Service: Endoscopy;  Laterality: N/A;  . Stent removal N/A 11/07/2013    Procedure: STENT REMOVAL;  Surgeon: Malissa Hippo, MD;  Location: [facility];  Service: Endoscopy;  Laterality: N/A;  . Sphincterotomy  11/07/2013    Procedure: EXTENSION OF SPHINCTEROTOMY;  Surgeon: Malissa Hippo, MD;  Location: [facility];  Service: Endoscopy;;   Family History:  Family History  Problem Relation Age of Onset  . Diabetes Mother   . Coronary artery disease Father   . Gout Father   . Breast cancer Sister   . Pancreatic cancer Brother   . Alcohol abuse Brother   . Healthy Brother   . Lung cancer  Sister   . Healthy Sister   . Depression Sister   . Hyperlipidemia Daughter   . Hyperlipidemia Son   . Bipolar disorder Son   . Healthy Sister   . Healthy Sister   . Seizures Other    Social History:   Social History   Social History  . Marital Status: Married    Spouse Name: N/A  . Number of Children: N/A  . Years of Education: N/A   Social History Main Topics  . Smoking status: Never Smoker   . Smokeless tobacco: Never Used  . Alcohol Use: No  . Drug Use: No  . Sexual Activity: Not Asked   Other Topics Concern  . None   Social History Narrative   Additional Social History: The patient grew up in [place] with both parents. Her parents argued a lot but there was no domestic violence. The patient completed high school but married very young. 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 She denies any history of abuse. She has few interests or hobbies and does not get any exercise  Musculoskeletal: Strength & Muscle Tone: within normal limits Gait & Station: normal Patient leans: N/A  Psychiatric  Specialty Exam: Depression        Associated symptoms include insomnia.   Review of Systems  Gastrointestinal: Positive for abdominal pain.  Psychiatric/Behavioral: Positive for depression. The patient has insomnia.   All other systems reviewed and are negative.   Blood pressure 137/67, pulse 70, height 5\' 4"  (1.626 m), weight 178 lb 12.8 oz (81.103 kg), SpO2 97 %.Body mass index is 30.68 kg/(m^2).  General Appearance: Casual, Neat and Well Groomed  Eye Contact:  Good  Speech:  Clear and Coherent  Volume:  Normal  Mood:  Euthymic  Affect:  Appropriate and Flat  Thought Process:  Goal Directed  Orientation:  Full (Time, Place, and Person)  Thought Content:  WDL  Suicidal Thoughts:  No  Homicidal Thoughts:  No  Memory:  Immediate;   Fair Recent;   Fair Remote;   Fair  Judgement:  Good  Insight:  Good  Psychomotor Activity:  Normal  Concentration:  Fair  Recall:  Fair  Fund of Knowledge:Good  Language: Good  Akathisia:  No  Handed:  Right  AIMS (if indicated):    Assets:  Communication Skills Desire for Improvement Leisure Time Resilience Social Support  ADL's:  Intact  Cognition: WNL  Sleep:  Irritable but not as good as it should be    Is the patient at risk to self?  No. Has the patient been a risk to self in the past 6 months?  No. Has the patient been a risk to self within the distant past?  No. Is the patient a risk to others?  No. Has the patient been a risk to others in the past 6 months?  No. Has the patient been a risk to others within the distant past?  No.  Allergies:   Allergies  Allergen Reactions  . Codeine Nausea And Vomiting  . Oxycodone    Current Medications: Current Outpatient Prescriptions  Medication Sig Dispense Refill  . amLODipine (NORVASC) 5 MG tablet Take by mouth.     . ARIPiprazole (ABILIFY) 10 MG tablet Take 1 tablet (10 mg total) by mouth at bedtime. 100 tablet 2  . calcium carbonate (TUMS - DOSED IN MG ELEMENTAL CALCIUM) 500 MG  chewable tablet Chew 1 tablet  by mouth 2 (two) times daily.    . Cholecalciferol (VITAMIN D) 400 UNITS capsule Take 400 Units by mouth daily.    Marland Kitchen Fish Oil-Cholecalciferol (FISH OIL + D3 PO) Take 1,200 mg by mouth 2 (two) times daily.     Marland Kitchen FLUZONE HIGH-DOSE 0.5 ML SUSY ADM 0.5ML IM UTD  0  . furosemide (LASIX) 40 MG tablet TAKE 1 TABLET BY MOUTH EVERY DAY AS NEEDED 30 tablet 0  . HYDROcodone-acetaminophen (LORTAB) 5-325 MG per tablet Take 1 tablet by mouth every 6 (six) hours as needed for moderate pain. 60 tablet 0  . lamoTRIgine (LAMICTAL) 200 MG tablet Take 1 tablet (200 mg total) by mouth daily. 90 tablet 2  . losartan (COZAAR) 100 MG tablet Take 100 mg by mouth daily.    . meloxicam (MOBIC) 15 MG tablet Take 15 mg by mouth daily.    . metoprolol tartrate (LOPRESSOR) 25 MG tablet Take 12.5 mg by mouth 2 (two) times daily.     Marland Kitchen omeprazole (PRILOSEC) 20 MG capsule TAKE 2 CAPSULES BY MOUTH EVERY DAY 180 capsule 3  . ondansetron (ZOFRAN) 4 MG tablet TAKE 1 TABLET BY MOUTH EVERY 8 HOURS AS NEEDED FOR NAUSEA OR VOMITING 30 tablet 0  . OVER THE COUNTER MEDICATION Fiber well - 5 grams 1 chewable tablet twice daily.    . polyethylene glycol powder (GLYCOLAX/MIRALAX) powder TAKE 8.5 GRAMS(1/2 CAPFUL) MIXED IN FLUID AND DRANK EVERY OTHER DAY 1581 g 3  . potassium chloride (K-DUR) 10 MEQ tablet TAKE 1 TABLET BY MOUTH EVERY DAY AS NEEDED 30 tablet 4  . simvastatin (ZOCOR) 20 MG tablet Take 20 mg by mouth every evening.     . vitamin B-12 (CYANOCOBALAMIN) 1000 MCG tablet Take 1,000 mcg by mouth daily.    Marland Kitchen zolpidem (AMBIEN) 10 MG tablet Take 1 tablet (10 mg total) by mouth at bedtime. 90 tablet 2  . zolpidem (AMBIEN CR) 12.5 MG CR tablet Take 1 tablet (12.5 mg total) by mouth at bedtime as needed for sleep. 30 tablet 2   No current facility-administered medications for this visit.    Previous Psychotropic Medications: Yes   Substance Abuse History in the last 12 months:  No.  Consequences of  Substance Abuse: NA  Medical Decision Making:  Established Problem, Stable/Improving (1), Review or order clinical lab tests (1), Decision to obtain old records (1), Review and summation of old records (2) and Review of Medication Regimen & Side Effects (2)  Treatment Plan Summary: Medication management  The patient seems to be stable on her current combination of Abilify and Lamictal for bipolar disorder. These medications will be continued. The Ambien will be changed to 12.5 mg of the CR form at bedtime. She'll return to see me in 3 months but call if any of her symptoms worsen.    [place], [facility] 1/12/20173:39 PM

## 2015-02-15 ENCOUNTER — Encounter (HOSPITAL_COMMUNITY)
Admission: RE | Admit: 2015-02-15 | Discharge: 2015-02-15 | Disposition: A | Discharge status: Home / Self Care | Payer: Medicare Other | Source: Ambulatory Visit | Attending: Internal Medicine | Admitting: Internal Medicine

## 2015-02-15 ENCOUNTER — Encounter (HOSPITAL_COMMUNITY): Payer: Self-pay

## 2015-02-15 DIAGNOSIS — R112 Nausea with vomiting, unspecified: Secondary | ICD-10-CM | POA: Diagnosis present

## 2015-02-15 MED ORDER — TECHNETIUM TC 99M SULFUR COLLOID
2.0000 | Freq: Once | INTRAVENOUS | Status: AC | PRN
  Administered 2015-02-15: 2.2 via ORAL

## 2015-02-19 ENCOUNTER — Telehealth (HOSPITAL_COMMUNITY): Payer: Self-pay | Admitting: *Deleted

## 2015-02-19 NOTE — Telephone Encounter (Signed)
Called [phone] and spoke to Elita Quick' with patients insurance to give more information that was requested for prior authorization of Zolpidem. Was told a decision would be made within 24 hours and faxed to office.

## 2015-02-22 NOTE — Telephone Encounter (Signed)
Denial came via fax. Will be sent to [facility] to start appeal process

## 2015-02-26 ENCOUNTER — Telehealth (HOSPITAL_COMMUNITY): Payer: Self-pay | Admitting: *Deleted

## 2015-02-26 NOTE — Telephone Encounter (Signed)
She has tried regular [place] also and it did not help. This authorization is for [employer]

## 2015-02-26 NOTE — Telephone Encounter (Signed)
Prior authorization for Zolpidem denied due to only trying Trazodone and no other formulary medication.

## 2015-03-05 ENCOUNTER — Telehealth (HOSPITAL_COMMUNITY): Payer: Self-pay | Admitting: *Deleted

## 2015-03-05 NOTE — Telephone Encounter (Signed)
noted 

## 2015-03-05 NOTE — Telephone Encounter (Signed)
Called [phone] was told to complete appeal online. Appeal process for Ambien CR completed online with [email or web address]. Confirmation given and decision will be faxed to office

## 2015-03-08 ENCOUNTER — Other Ambulatory Visit (HOSPITAL_COMMUNITY): Payer: Self-pay | Admitting: Psychiatry

## 2015-03-08 ENCOUNTER — Encounter (HOSPITAL_COMMUNITY): Payer: Self-pay | Admitting: *Deleted

## 2015-03-08 NOTE — Progress Notes (Signed)
Prior Auth Approval for Zolpidem Tartrate ER Tab 12.5 mg. Ref number is [id number] #30 tablets for 30 days Approved from 02-22-15 until further notice per fax Contact number [phone]

## 2015-03-18 ENCOUNTER — Other Ambulatory Visit (INDEPENDENT_AMBULATORY_CARE_PROVIDER_SITE_OTHER): Payer: Self-pay | Admitting: Internal Medicine

## 2015-04-27 ENCOUNTER — Telehealth (INDEPENDENT_AMBULATORY_CARE_PROVIDER_SITE_OTHER): Payer: Self-pay | Admitting: Internal Medicine

## 2015-04-27 NOTE — Telephone Encounter (Signed)
Fayrene FearingJames called in regards to Ms. Jardin. He'd like a return phone call from Tammy. He said Ms. Carleene Mainsurpin has been doing pretty badly this month and he'd like to inform Tammy of her symptoms. He wouldn't speak of them over the phone to me.  Pt's ph# [phone] Thank you.

## 2015-04-28 NOTE — Telephone Encounter (Signed)
Talked with the patient's husband. He says that his wife has been sick on and off consistent. The month of March it has been everyday. She has been nauseated , vomiting some days , weak , abdominal pain. No fever. Feet and Legs are swollen but she takes lasix and Potassium for this.  Patient's husband says they cannot understand why the cause cannot be found. Patient has an appointment 07/20/2015 , he feels that she may need to be seen sooner than this.  He says that his wife has said that she feels better today. [phone]. To discuss with Dr.Rehman.

## 2015-04-29 ENCOUNTER — Encounter (INDEPENDENT_AMBULATORY_CARE_PROVIDER_SITE_OTHER): Payer: Self-pay | Admitting: Internal Medicine

## 2015-04-29 NOTE — Telephone Encounter (Signed)
Ms. Lindsay Palmer is scheduled with Terri on April 18th, 2017 at 9:30am. She was made aware of this. Thank you.

## 2015-04-29 NOTE — Telephone Encounter (Signed)
Noted  

## 2015-04-29 NOTE — Telephone Encounter (Signed)
Discussed with Lindsay Palmer , Print production planneroffice manager. We will bring the patient in on a Tuesday to see Terri, while Dr.Rehman is in the office to go over and discuss visit with her. Patient was called and made aware that she will get a call from receptionist with that appointment.

## 2015-05-11 ENCOUNTER — Ambulatory Visit (HOSPITAL_COMMUNITY): Payer: Self-pay | Admitting: Psychiatry

## 2015-05-12 ENCOUNTER — Ambulatory Visit (HOSPITAL_COMMUNITY): Payer: Self-pay | Admitting: Psychiatry

## 2015-05-18 ENCOUNTER — Encounter (INDEPENDENT_AMBULATORY_CARE_PROVIDER_SITE_OTHER): Payer: Self-pay | Admitting: *Deleted

## 2015-05-18 ENCOUNTER — Encounter (INDEPENDENT_AMBULATORY_CARE_PROVIDER_SITE_OTHER): Payer: Self-pay | Admitting: Internal Medicine

## 2015-05-18 ENCOUNTER — Ambulatory Visit (INDEPENDENT_AMBULATORY_CARE_PROVIDER_SITE_OTHER): Payer: Medicare Other | Admitting: Internal Medicine

## 2015-05-18 VITALS — BP 170/60 | HR 60 | Temp 98.6°F | Ht 64.0 in | Wt 178.7 lb

## 2015-05-18 DIAGNOSIS — R1084 Generalized abdominal pain: Secondary | ICD-10-CM | POA: Diagnosis not present

## 2015-05-18 DIAGNOSIS — R110 Nausea: Secondary | ICD-10-CM

## 2015-05-18 LAB — HEPATIC FUNCTION PANEL
ALBUMIN: 4.2 g/dL (ref 3.6–5.1)
ALT: 23 U/L (ref 6–29)
AST: 22 U/L (ref 10–35)
Alkaline Phosphatase: 135 U/L — ABNORMAL HIGH (ref 33–130)
BILIRUBIN TOTAL: 0.3 mg/dL (ref 0.2–1.2)
Bilirubin, Direct: 0.1 mg/dL (ref <=0.2)
Indirect Bilirubin: 0.2 mg/dL (ref 0.2–1.2)
TOTAL PROTEIN: 7 g/dL (ref 6.1–8.1)

## 2015-05-18 LAB — CBC WITH DIFFERENTIAL/PLATELET
BASOS ABS: 0 {cells}/uL (ref 0–200)
BASOS PCT: 0 %
EOS ABS: 80 {cells}/uL (ref 15–500)
Eosinophils Relative: 1 %
HCT: 35.1 % (ref 35.0–45.0)
HEMOGLOBIN: 11.4 g/dL — AB (ref 11.7–15.5)
LYMPHS ABS: 1280 {cells}/uL (ref 850–3900)
Lymphocytes Relative: 16 %
MCH: 28.4 pg (ref 27.0–33.0)
MCHC: 32.5 g/dL (ref 32.0–36.0)
MCV: 87.5 fL (ref 80.0–100.0)
MONO ABS: 800 {cells}/uL (ref 200–950)
MPV: 10.6 fL (ref 7.5–12.5)
Monocytes Relative: 10 %
NEUTROS ABS: 5840 {cells}/uL (ref 1500–7800)
Neutrophils Relative %: 73 %
Platelets: 223 10*3/uL (ref 140–400)
RBC: 4.01 MIL/uL (ref 3.80–5.10)
RDW: 15.2 % — ABNORMAL HIGH (ref 11.0–15.0)
WBC: 8 10*3/uL (ref 3.8–10.8)

## 2015-05-18 LAB — AMYLASE: AMYLASE: 39 U/L (ref 0–105)

## 2015-05-18 NOTE — Patient Instructions (Signed)
Labs. CT abdomen/pelvis with CM.

## 2015-05-18 NOTE — Progress Notes (Signed)
   Subjective:    Patient ID: Lindsay Palmer, female    DOB: 16-Feb-1940, 75 y.o.   MRN: [id number]  HPI Here today for f/u for nausea. She was last seen by Dr. Karilyn Cotaehman in December. Hx of CBD stricture and post ERcP pancreatitis (June of 2015). She tells me she continues to have nausea and some abdominal pain. Her appetite is good. She has gained almost 2 pounds since her last visit.  The nausea is sporadic.  She c/o lower abdominal pain which is not related to having a BM.  Her husband says she is sick almost every day. When she is sick she will take a pain pill, nausea pill and go to bed. She has no energy.  She has BM every other day.  Chills but no fever. Today she feels fine. She is not exercising.   Her last colonoscopy was in 2008 by Dr. Aleene DavidsonSpainhour.  Indications: Prior ascending colon polyp per Dr. Allena KatzPatel in 2000. Findings: Diverticulosis of sigmoid colon. No further polyhpoid disease.   02/15/2015 NM Gastric emptying study: nausea  IMPRESSION: Normal gastric emptying study. Slightly more residual within the stomach at 4 hours.   01/12/2014 CT abdomen/pelvis with CM:  IMPRESSION: Old pancreatic pseudocyst noted on prior exam are significantly smaller currently. There is no evidence of acute pancreatitis currently.   Mild sigmoid diverticulosis is is again noted. Increased wall thickening of the distal portion of the sigmoid colon and rectum is noted with surrounding inflammatory changes suggesting proctocolitis    Review of Systems     Objective:   Physical Exam Blood pressure 170/60, pulse 60, temperature 98.6 F (37 C), height 5\' 4"  (1.626 m), weight 178 lb 11.2 oz (81.058 kg).  Alert and oriented. Skin warm and dry. Oral mucosa is moist.   . Sclera anicteric, conjunctivae is pink. Thyroid not enlarged. No cervical lymphadenopathy. Lungs clear. Heart regular rate and rhythm.  Abdomen is soft. Bowel sounds are positive. No hepatomegaly. No abdominal masses felt. No  tenderness.  1 + edema to lower extremities.         Assessment & Plan:  Nausea for greater than a year. Ct abdomen/pelvis with CM.  CBC, Hepatic, amylase.

## 2015-05-19 LAB — CREATININE, SERUM: Creat: 0.97 mg/dL — ABNORMAL HIGH (ref 0.60–0.93)

## 2015-05-21 ENCOUNTER — Telehealth (INDEPENDENT_AMBULATORY_CARE_PROVIDER_SITE_OTHER): Payer: Self-pay | Admitting: Internal Medicine

## 2015-05-21 NOTE — Telephone Encounter (Signed)
Patient called stating she was returning a mans call.  She thinks it was to give her lab results.  She was seen by Terri on Tuesday, 4/18 and stated that she had labs done that day as well.  [phone]

## 2015-05-21 NOTE — Telephone Encounter (Signed)
Forwarded to Dr.Rehman as Terri noted that Dr.Rehman was going to call the patient with her results.

## 2015-05-23 NOTE — Telephone Encounter (Signed)
Results reviewed with patient, Amylase is normal, CBC is normal except hemoglobin is mildly decreased at 11.4, LFTs normal except borderline alkaline phosphatase which remains stable. CT is pending. Report to PCP.

## 2015-05-25 ENCOUNTER — Ambulatory Visit (HOSPITAL_COMMUNITY)
Admission: RE | Admit: 2015-05-25 | Discharge: 2015-05-25 | Disposition: A | Discharge status: Home / Self Care | Payer: Medicare Other | Source: Ambulatory Visit | Attending: Internal Medicine | Admitting: Internal Medicine

## 2015-05-25 DIAGNOSIS — K5730 Diverticulosis of large intestine without perforation or abscess without bleeding: Secondary | ICD-10-CM | POA: Diagnosis not present

## 2015-05-25 DIAGNOSIS — R110 Nausea: Secondary | ICD-10-CM | POA: Diagnosis present

## 2015-05-25 DIAGNOSIS — R1084 Generalized abdominal pain: Secondary | ICD-10-CM | POA: Diagnosis present

## 2015-05-25 MED ORDER — IOPAMIDOL (ISOVUE-300) INJECTION 61%
100.0000 mL | Freq: Once | INTRAVENOUS | Status: AC | PRN
  Administered 2015-05-25: 100 mL via INTRAVENOUS

## 2015-05-25 NOTE — Telephone Encounter (Signed)
Lab report faxed to PCP ? ?

## 2015-05-28 ENCOUNTER — Other Ambulatory Visit (INDEPENDENT_AMBULATORY_CARE_PROVIDER_SITE_OTHER): Payer: Self-pay | Admitting: Internal Medicine

## 2015-05-28 ENCOUNTER — Telehealth (INDEPENDENT_AMBULATORY_CARE_PROVIDER_SITE_OTHER): Payer: Self-pay | Admitting: Internal Medicine

## 2015-05-28 MED ORDER — DICYCLOMINE HCL 10 MG PO CAPS: 10.0000 mg | ORAL_CAPSULE | Freq: Two times a day (BID) | ORAL | Status: DC

## 2015-05-28 NOTE — Telephone Encounter (Signed)
I called patient and whenever CT findings with her. No evidence of pancreatitis. Bile duct is nondilated and pseudocysts of resolved. Diverticulosis without diverticulitis. Will treat with dicyclomine 10 mg before breakfast and lunch. Patient will keep symptom diary and call with progress report in 2 weeks.

## 2015-06-03 ENCOUNTER — Other Ambulatory Visit (HOSPITAL_COMMUNITY): Payer: Self-pay | Admitting: Psychiatry

## 2015-06-07 ENCOUNTER — Telehealth (HOSPITAL_COMMUNITY): Payer: Self-pay | Admitting: *Deleted

## 2015-06-07 NOTE — Telephone Encounter (Signed)
noted 

## 2015-06-07 NOTE — Telephone Encounter (Signed)
Pt pharmacy requesting refills for pt Ambien 10 mg. Pt medication last printed on 11-12-14. Pt f/u appt is schedule for 06-08-15. Pt pharmacy number is [phone]

## 2015-06-07 NOTE — Telephone Encounter (Signed)
She can get it at appt tomorrow

## 2015-06-08 ENCOUNTER — Ambulatory Visit (INDEPENDENT_AMBULATORY_CARE_PROVIDER_SITE_OTHER): Payer: Medicare Other | Admitting: Psychiatry

## 2015-06-08 ENCOUNTER — Encounter (HOSPITAL_COMMUNITY): Payer: Self-pay | Admitting: Psychiatry

## 2015-06-08 VITALS — BP 162/70 | HR 71 | Ht 64.0 in | Wt 177.6 lb

## 2015-06-08 DIAGNOSIS — F3130 Bipolar disorder, current episode depressed, mild or moderate severity, unspecified: Secondary | ICD-10-CM | POA: Diagnosis not present

## 2015-06-08 MED ORDER — ZOLPIDEM TARTRATE 10 MG PO TABS: 10.0000 mg | ORAL_TABLET | Freq: Every day | ORAL | Status: DC

## 2015-06-08 MED ORDER — LAMOTRIGINE 200 MG PO TABS
200.0000 mg | ORAL_TABLET | Freq: Every day | ORAL | Status: DC
Start: 2015-06-08 — End: 2015-10-07

## 2015-06-08 NOTE — Progress Notes (Signed)
Patient ID: Fradel Baldonado, female   DOB: 31-Jul-1940, 75 y.o.   MRN: [id number] Patient ID: Ligaya Cormier, female   DOB: 03/19/1940, 75 y.o.   MRN: [id number] Patient ID: Valbona Slabach, female   DOB: 07/29/1940, 75 y.o.   MRN: [id number]  Psychiatric  Adult follow-up  Patient Identification: Lindsay Palmer MRN:  [id number] Date of Evaluation:  06/08/2015 Referral Source: [facility] Chief Complaint:   Chief Complaint    Depression; Anxiety; Follow-up     Visit Diagnosis:    ICD-9-CM ICD-10-CM   1. Bipolar I disorder, most recent episode depressed (HCC) 296.50 F31.30    Diagnosis:   Patient Active Problem List   Diagnosis Date Noted  . Bipolar I disorder, most recent episode depressed (HCC) [F31.30] 07/29/2014  . Unspecified constipation [K59.00] 09/05/2013  . 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 Anemia [D64.9] 07/23/2013  . Drug-induced pancreatitis DUE TO CONTRAST [K85.3] 07/19/2013  . Benign essential HTN [I10] 07/19/2013  . Urinary retention [R33.9] 07/19/2013  . Acute pancreatitis [K85.90] 07/19/2013  . Abdominal pain [R10.9] 07/10/2013   History of Present Illness:  This patient is a 75 year old married white female who lives with her husband in [place]. She has a son, daughter and 3 grandchildren. She was working in [employer] prior to her retirement.  The patient was referred by [facility] where she had been receiving treatment. Her physician recently left the practice and they are no longer taking her insurance. She has a long-term history of bipolar disorder.  The patient presents with her husband today. They have been married since she was 75 years old. He states that for much of their marriage she had bouts of severe mood swings. At times she was pleasant and easy to be with the other time she was angry irritable and out of control. She also went through periods of euphoria and spending a lot of money. At one point they went to a family  counselor who suspected she had bipolar disorder. She was eventually referred here to this office and saw Dr. Lolly Mustache. He started her on Abilify and it really helped in terms of decreasing the anger and irritability. Eventually however she started seeing Dr. Tomasa Rand at [facility] because she was still having difficulties with being depressed. He added Lamictal and this seemed to stabilize her mood and she was no longer depressed or manic. The patient has been on this combination of medicines for several years and she seems very stable now. Her only difficulty is that she doesn't sleep well even with the addition of Ambien 10 mg at bedtime.  At times the patient sleeps up to 5 hours at night and sometimes only 2 hours. She's tried almost every medication there is for sleep. Her mood is generally good. However she is often tired in her energy is variable. She had a history of pancreatitis last year after having ERCP and the contrast dye caused the pancreatitis. She still has bouts of nausea and weakness. She's been followed by GI in her laboratories are normal. Her husband thinks she spends too much time in bed. She spends most of her time doing housework. She is stressed because her son and her husband are not getting along . her son is on disability due to a back injury but is trying to work doing Aeronautical engineer which worries her  The patient has never had a psychiatric hospitalization. She does not use drugs or alcohol and has never had any psychotic symptoms such as auditory  or visual hallucinations or paranoia  The patient and her husband return after 4 months. She states that Dr.Rehman recently put her on dicyclomine for stomach after having primarily negative studies. The medicine has helped her stomach a lot and she is no longer having nausea and she is eating normally. Her mood is been stable and she's neither been manic nor depressed. Her husband didn't want to pay the exorbitant fee for the  Ambien CR so she is stayed on Ambien 10 mg and for the most part she is sleeping well Elements:  Location:  Global. Quality:  Waxing and waning. Severity:  Low at this point, well controlled on medications. Timing:  Began when she was around 75 years old. Duration:  Years. Context:  Probably biological. Associated Signs/Symptoms: Depression Symptoms:  depressed mood, insomnia, psychomotor retardation, anxiety, disturbed sleep, (Hypo) Manic Symptoms:  Impulsivity, Irritable Mood,   Past Medical History:  Past Medical History  Diagnosis Date  . GERD (gastroesophageal reflux disease)   . Bipolar 2 disorder (HCC)   . Major depressive disorder (HCC)   . Chronic insomnia   . Osteopenia   . Vitamin D deficiency   . Migraine   . Elevated LFTs   . Diverticulosis of colon   . H/O first degree atrioventricular block   . Fatty liver disease, nonalcoholic   . Hypertension   . Hypercholesteremia   . Sleep apnea     Stop Bang score of 4. Pt said she had sleep apnea at one time, but she has had another sleep study since then and they said she does not have it anymore.  . Pancreatitis     Past Surgical History  Procedure Laterality Date  . Neck surgery      Stenosis C6-C7  . Cholecystectomy    . Cataract surgery Bilateral 08-2012  . Colonoscopy  2003    Diverticulitis h/o polyps -due next 2015 -gastroscopy 2003  . Sigmoidoscopy  2007    no polyps  . Ercp    . Ercp N/A 07/18/2013    Procedure: ENDOSCOPIC RETROGRADE CHOLANGIOPANCREATOGRAPHY (ERCP) COMMON BILE DUCT BRUSHING;  Surgeon: Malissa Hippo, MD;  Location: [facility];  Service: Endoscopy;  Laterality: N/A;  . Sphincterotomy N/A 07/18/2013    Procedure: SPHINCTEROTOMY;  Surgeon: Malissa Hippo, MD;  Location: [facility];  Service: Endoscopy;  Laterality: N/A;  . Biliary stent placement N/A 07/18/2013    Procedure: BILIARY STENT PLACEMENT;  Surgeon: Malissa Hippo, MD;  Location: [facility];  Service: Endoscopy;  Laterality: N/A;  . Eye  surgery Bilateral     cataract removal  . Ercp N/A 11/07/2013    Procedure: ENDOSCOPIC RETROGRADE CHOLANGIOPANCREATOGRAPHY (ERCP);  Surgeon: Malissa Hippo, MD;  Location: [facility];  Service: Endoscopy;  Laterality: N/A;  . Stent removal N/A 11/07/2013    Procedure: STENT REMOVAL;  Surgeon: Malissa Hippo, MD;  Location: [facility];  Service: Endoscopy;  Laterality: N/A;  . Sphincterotomy  11/07/2013    Procedure: EXTENSION OF SPHINCTEROTOMY;  Surgeon: Malissa Hippo, MD;  Location: [facility];  Service: Endoscopy;;   Family History:  Family History  Problem Relation Age of Onset  . Diabetes Mother   . Coronary artery disease Father   . Gout Father   . Breast cancer Sister   . Pancreatic cancer Brother   . Alcohol abuse Brother   . Healthy Brother   . Lung cancer Sister   . Healthy Sister   . Depression Sister   . Hyperlipidemia Daughter   .  Hyperlipidemia Son   . Bipolar disorder Son   . Healthy Sister   . Healthy Sister   . Seizures Other    Social History:   Social History   Social History  . Marital Status: Married    Spouse Name: N/A  . Number of Children: N/A  . Years of Education: N/A   Social History Main Topics  . Smoking status: Never Smoker   . Smokeless tobacco: Never Used  . Alcohol Use: No  . Drug Use: No  . Sexual Activity: Not Asked   Other Topics Concern  . None   Social History Narrative   Additional Social History: The patient grew up in [place] with [place] parents. Her parents argued a lot but there was no domestic violence. The patient completed high school but married very young. 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 She denies any history of abuse. She has few interests or hobbies and does not get any exercise  Musculoskeletal: Strength & Muscle Tone: within normal limits Gait & Station: normal Patient leans: N/A  Psychiatric Specialty Exam: Depression        Associated  symptoms include insomnia.  Past medical history includes anxiety.   Anxiety Symptoms include insomnia.      Review of Systems  Gastrointestinal: Positive for abdominal pain.  Psychiatric/Behavioral: Positive for depression. The patient has insomnia.   All other systems reviewed and are negative.   Blood pressure 162/70, pulse 71, height 5\' 4"  (1.626 m), weight 177 lb 9.6 oz (80.559 kg).Body mass index is 30.47 kg/(m^2).  General Appearance: Casual, Neat and Well Groomed  Eye Contact:  Good  Speech:  Clear and Coherent  Volume:  Normal  Mood:  Euthymic  Affect: Brighter, less blunted   Thought Process:  Goal Directed  Orientation:  Full (Time, Place, and Person)  Thought Content:  WDL  Suicidal Thoughts:  No  Homicidal Thoughts:  No  Memory:  Immediate;   Fair Recent;   Fair Remote;   Fair  Judgement:  Good  Insight:  Good  Psychomotor Activity:  Normal  Concentration:  Fair  Recall:  Fair  Fund of Knowledge:Good  Language: Good  Akathisia:  No  Handed:  Right  AIMS (if indicated):    Assets:  Communication Skills Desire for Improvement Leisure Time Resilience Social Support  ADL's:  Intact  Cognition: WNL  Sleep:  Irritable but not as good as it should be    Is the patient at risk to self?  No. Has the patient been a risk to self in the past 6 months?  No. Has the patient been a risk to self within the distant past?  No. Is the patient a risk to others?  No. Has the patient been a risk to others in the past 6 months?  No. Has the patient been a risk to others within the distant past?  No.  Allergies:   Allergies  Allergen Reactions  . Codeine Nausea And Vomiting  . Oxycodone    Current Medications: Current Outpatient Prescriptions  Medication Sig Dispense Refill  . amLODipine (NORVASC) 5 MG tablet Take by mouth. 1  1/2 tab daily.    . ARIPiprazole (ABILIFY) 10 MG tablet Take 1 tablet (10 mg total) by mouth at bedtime. 100 tablet 2  . calcium carbonate  (TUMS - DOSED IN MG ELEMENTAL CALCIUM) 500 MG chewable tablet Chew 1 tablet by mouth 2 (  two) times daily.    . Cholecalciferol (VITAMIN D) 400 UNITS capsule Take 400 Units by mouth daily.    Marland Kitchen dicyclomine (BENTYL) 10 MG capsule Take 1 capsule (10 mg total) by mouth 2 (two) times daily with breakfast and lunch. 60 capsule 2  . Fish Oil-Cholecalciferol (FISH OIL + D3 PO) Take 1,200 mg by mouth 2 (two) times daily.     . furosemide (LASIX) 40 MG tablet TAKE 1 TABLET BY MOUTH EVERY DAY AS NEEDED 30 tablet 0  . HYDROcodone-acetaminophen (LORTAB) 5-325 MG per tablet Take 1 tablet by mouth every 6 (six) hours as needed for moderate pain. 60 tablet 0  . lamoTRIgine (LAMICTAL) 200 MG tablet Take 1 tablet (200 mg total) by mouth daily. 90 tablet 2  . losartan (COZAAR) 100 MG tablet Take 100 mg by mouth daily.    . meloxicam (MOBIC) 15 MG tablet Take 15 mg by mouth daily.    . metoprolol tartrate (LOPRESSOR) 25 MG tablet Take 12.5 mg by mouth 2 (two) times daily.     . ondansetron (ZOFRAN) 4 MG tablet TAKE 1 TABLET BY MOUTH EVERY 8 HOURS AS NEEDED FOR NAUSEA OR VOMITING 30 tablet 0  . OVER THE COUNTER MEDICATION Fiber well - 5 grams 1 chewable tablet twice daily.    . polyethylene glycol powder (GLYCOLAX/MIRALAX) powder TAKE 8.5 GRAMS(1/2 CAPFUL) MIXED IN FLUID AND DRANK EVERY OTHER DAY 1581 g 3  . potassium chloride (K-DUR) 10 MEQ tablet TAKE 1 TABLET BY MOUTH EVERY DAY AS NEEDED 30 tablet 4  . simvastatin (ZOCOR) 20 MG tablet Take 20 mg by mouth every evening.     . vitamin B-12 (CYANOCOBALAMIN) 1000 MCG tablet Take 1,000 mcg by mouth daily.    Marland Kitchen zolpidem (AMBIEN) 10 MG tablet Take 1 tablet (10 mg total) by mouth at bedtime. 90 tablet 2   No current facility-administered medications for this visit.    Previous Psychotropic Medications: Yes   Substance Abuse History in the last 12 months:  No.  Consequences of Substance Abuse: NA  Medical Decision Making:  Established Problem, Stable/Improving (1),  Review or order clinical lab tests (1), Decision to obtain old records (1), Review and summation of old records (2) and Review of Medication Regimen & Side Effects (2)  Treatment Plan Summary: Medication management  The patient seems to be stable on her current combination of Abilify and Lamictal for bipolar disorder. These medications will be continued. The Ambien 10 mg will be continued at bedtime for sleep She'll return to see me in 4 months but call if any of her symptoms worsen.    Lindsay Palmer 5/9/20171:20 PM

## 2015-06-11 ENCOUNTER — Telehealth (HOSPITAL_COMMUNITY): Payer: Self-pay | Admitting: *Deleted

## 2015-06-11 NOTE — Telephone Encounter (Signed)
Prior authorization for Ambien received. Called [phone] spoke with Zack who states a decision will be faxed. Call reference #[id number].

## 2015-06-11 NOTE — Telephone Encounter (Signed)
Per Verbal permission from pt, pt stated office could communicate with her husband. Informed pt husband the status of pt Prior Auth Request. Also provided pt husband the ref number and the name of the person office spoke with.

## 2015-06-15 ENCOUNTER — Telehealth (INDEPENDENT_AMBULATORY_CARE_PROVIDER_SITE_OTHER): Payer: Self-pay | Admitting: Internal Medicine

## 2015-06-15 ENCOUNTER — Telehealth (HOSPITAL_COMMUNITY): Payer: Self-pay | Admitting: *Deleted

## 2015-06-15 NOTE — Telephone Encounter (Signed)
Prior authorization for Ambien 10mg  was denied due to diagnosis of Bipolar disorder and not Insomnia.

## 2015-06-15 NOTE — Telephone Encounter (Signed)
F51.01

## 2015-06-15 NOTE — Telephone Encounter (Signed)
Dr.Rehman was made aware. 

## 2015-06-15 NOTE — Telephone Encounter (Signed)
Patient called, left a message and stated that the new medication Dr. Karilyn Cotaehman put her on is working fine.  She stated that she doesn't have anymore stomach pain.  She wanted to make sure he knew.  [phone]

## 2015-06-16 ENCOUNTER — Telehealth (HOSPITAL_COMMUNITY): Payer: Self-pay | Admitting: *Deleted

## 2015-06-16 NOTE — Telephone Encounter (Signed)
Phone call from [place] regarding an appeal for meds.  she will fax .

## 2015-06-16 NOTE — Telephone Encounter (Signed)
[facility] regarding an appeal for meds.  she will fax .

## 2015-06-16 NOTE — Telephone Encounter (Signed)
Called pt insurance to start an expedited request for her Zolpidem. Spoke with Dorothyann GibbsFinch and he stated that an expedited request takes 72 hours and a normal request takes 7 days. Reference number is [id number] and number was repeated back and [employer] approved.

## 2015-06-17 ENCOUNTER — Telehealth (HOSPITAL_COMMUNITY): Payer: Self-pay | Admitting: *Deleted

## 2015-06-17 NOTE — Telephone Encounter (Signed)
Called for appeal of Ambien but noticed it had been done on 06/16/15. No further action required at this time.

## 2015-06-18 NOTE — Telephone Encounter (Signed)
Received approval from insurance

## 2015-06-20 ENCOUNTER — Other Ambulatory Visit (INDEPENDENT_AMBULATORY_CARE_PROVIDER_SITE_OTHER): Payer: Self-pay | Admitting: Internal Medicine

## 2015-07-20 ENCOUNTER — Ambulatory Visit (INDEPENDENT_AMBULATORY_CARE_PROVIDER_SITE_OTHER): Payer: Self-pay | Admitting: Internal Medicine

## 2015-07-28 ENCOUNTER — Encounter (INDEPENDENT_AMBULATORY_CARE_PROVIDER_SITE_OTHER): Payer: Self-pay | Admitting: Internal Medicine

## 2015-07-28 ENCOUNTER — Ambulatory Visit (INDEPENDENT_AMBULATORY_CARE_PROVIDER_SITE_OTHER): Payer: Medicare Other | Admitting: Internal Medicine

## 2015-07-28 VITALS — BP 156/48 | HR 60 | Temp 97.3°F | Ht 64.0 in | Wt 179.1 lb

## 2015-07-28 DIAGNOSIS — R110 Nausea: Secondary | ICD-10-CM | POA: Diagnosis not present

## 2015-07-28 NOTE — Progress Notes (Signed)
Subjective:    Patient ID: Lindsay LinerHelen Palmer, female    DOB: [birth year], 75 y.o.   MRN: [id number]  HPI Here today for f/u. She was last seen by me in April of this year. Hx of nausea. Hx of CBC stricture and post ERCP pancreatitis (June of 2015).  She was started on Dicyclomine. She tells me she does have some weak spells at times.  She has not had any nausea in the past couple of weeks. She is having a BM almost daily. Stools are normal. No melena or BRRB.  Marland Kitchen.appetite is good. She has gained one pound since her visit in April.  Hx of CBD stricture and post ERcP pancreatitis (June of 2015).  CBC    Component Value Date/Time   WBC 8.0 05/18/2015 1002   RBC 4.01 05/18/2015 1002   HGB 11.4* 05/18/2015 1002   HCT 35.1 05/18/2015 1002   PLT 223 05/18/2015 1002   MCV 87.5 05/18/2015 1002   MCH 28.4 05/18/2015 1002   MCHC 32.5 05/18/2015 1002   RDW 15.2* 05/18/2015 1002   LYMPHSABS 1280 05/18/2015 1002   MONOABS 800 05/18/2015 1002   EOSABS 80 05/18/2015 1002   BASOSABS 0 05/18/2015 1002    Amylase    Component Value Date/Time   AMYLASE 39 05/18/2015 1002    Hepatic Function Panel     Component Value Date/Time   PROT 7.0 05/18/2015 1002   ALBUMIN 4.2 05/18/2015 1002   AST 22 05/18/2015 1002   ALT 23 05/18/2015 1002   ALKPHOS 135* 05/18/2015 1002   BILITOT 0.3 05/18/2015 1002   BILIDIR 0.1 05/18/2015 1002   IBILI 0.2 05/18/2015 1002      05/25/2015 CT abdomen/pelvis with CM: Nausea; abdominal pain IMPRESSION: Left colonic diverticulosis. No active diverticulitis. Moderate stool burden in the colon.  No acute findings in the abdomen or pelvis.  02/15/2015 Gastric emptying study: normal.     Her last colonoscopy was in 2008 by Dr. Aleene DavidsonSpainhour. Indications: Prior ascending colon polyp per Dr. Allena KatzPatel in 2000. Findings: Diverticulosis of sigmoid colon. No further polyhpoid disease.   02/15/2015 NM Gastric emptying study: nausea  IMPRESSION: Normal gastric emptying  study. Slightly more residual within the stomach at 4 hours.   01/12/2014 CT abdomen/pelvis with CM:  IMPRESSION: Old pancreatic pseudocyst noted on prior exam are significantly smaller currently. There is no evidence of acute pancreatitis currently.  Mild sigmoid diverticulosis is is again noted. Increased wall thickening of the distal portion of the sigmoid colon and rectum is noted with surrounding inflammatory changes suggesting proctocolitis    Review of Systems Past Medical History  Diagnosis Date  . GERD (gastroesophageal reflux disease)   . Bipolar 2 disorder (HCC)   . Major depressive disorder (HCC)   . Chronic insomnia   . Osteopenia   . Vitamin D deficiency   . Migraine   . Elevated LFTs   . Diverticulosis of colon   . H/O first degree atrioventricular block   . Fatty liver disease, nonalcoholic   . Hypertension   . Hypercholesteremia   . Sleep apnea     Stop Bang score of 4. Pt said she had sleep apnea at one time, but she has had another sleep study since then and they said she does not have it anymore.  . Pancreatitis     Past Surgical History  Procedure Laterality Date  . Neck surgery      Stenosis C6-C7  . Cholecystectomy    . Cataract surgery Bilateral  08-2012  . Colonoscopy  2003    Diverticulitis h/o polyps -due next 2015 -gastroscopy 2003  . Sigmoidoscopy  2007    no polyps  . Ercp    . Ercp N/A 07/18/2013    Procedure: ENDOSCOPIC RETROGRADE CHOLANGIOPANCREATOGRAPHY (ERCP) COMMON BILE DUCT BRUSHING;  Surgeon: Malissa Hippo, MD;  Location: [facility];  Service: Endoscopy;  Laterality: N/A;  . Sphincterotomy N/A 07/18/2013    Procedure: SPHINCTEROTOMY;  Surgeon: Malissa Hippo, MD;  Location: [facility];  Service: Endoscopy;  Laterality: N/A;  . Biliary stent placement N/A 07/18/2013    Procedure: BILIARY STENT PLACEMENT;  Surgeon: Malissa Hippo, MD;  Location: [facility];  Service: Endoscopy;  Laterality: N/A;  . Eye surgery Bilateral     cataract  removal  . Ercp N/A 11/07/2013    Procedure: ENDOSCOPIC RETROGRADE CHOLANGIOPANCREATOGRAPHY (ERCP);  Surgeon: Malissa Hippo, MD;  Location: [facility];  Service: Endoscopy;  Laterality: N/A;  . Stent removal N/A 11/07/2013    Procedure: STENT REMOVAL;  Surgeon: Malissa Hippo, MD;  Location: [facility];  Service: Endoscopy;  Laterality: N/A;  . Sphincterotomy  11/07/2013    Procedure: EXTENSION OF SPHINCTEROTOMY;  Surgeon: Malissa Hippo, MD;  Location: [facility];  Service: Endoscopy;;    Allergies  Allergen Reactions  . Codeine Nausea And Vomiting  . Oxycodone     Current Outpatient Prescriptions on File Prior to Visit  Medication Sig Dispense Refill  . amLODipine (NORVASC) 5 MG tablet Take by mouth. 1  1/2 tab daily.    . ARIPiprazole (ABILIFY) 10 MG tablet Take 1 tablet (10 mg total) by mouth at bedtime. 100 tablet 2  . calcium carbonate (TUMS - DOSED IN MG ELEMENTAL CALCIUM) 500 MG chewable tablet Chew 1 tablet by mouth 2 (two) times daily.    . Cholecalciferol (VITAMIN D) 400 UNITS capsule Take 400 Units by mouth daily.    Marland Kitchen dicyclomine (BENTYL) 10 MG capsule Take 1 capsule (10 mg total) by mouth 2 (two) times daily with breakfast and lunch. 60 capsule 2  . Fish Oil-Cholecalciferol (FISH OIL + D3 PO) Take 1,200 mg by mouth 2 (two) times daily.     . furosemide (LASIX) 40 MG tablet TAKE 1 TABLET BY MOUTH EVERY DAY AS NEEDED 30 tablet 1  . HYDROcodone-acetaminophen (LORTAB) 5-325 MG per tablet Take 1 tablet by mouth every 6 (six) hours as needed for moderate pain. 60 tablet 0  . losartan (COZAAR) 100 MG tablet Take 100 mg by mouth daily.    . meloxicam (MOBIC) 15 MG tablet Take 15 mg by mouth daily.    . metoprolol tartrate (LOPRESSOR) 25 MG tablet Take 12.5 mg by mouth 2 (two) times daily.     . ondansetron (ZOFRAN) 4 MG tablet TAKE 1 TABLET BY MOUTH EVERY 8 HOURS AS NEEDED FOR NAUSEA OR VOMITING 30 tablet 0  . OVER THE COUNTER MEDICATION Fiber well - 5 grams 1 chewable tablet twice daily.      . polyethylene glycol powder (GLYCOLAX/MIRALAX) powder TAKE 8.5 GRAMS(1/2 CAPFUL) MIXED IN FLUID AND DRANK EVERY OTHER DAY 1581 g 3  . potassium chloride (K-DUR) 10 MEQ tablet TAKE 1 TABLET BY MOUTH EVERY DAY AS NEEDED 30 tablet 4  . simvastatin (ZOCOR) 20 MG tablet Take 20 mg by mouth every evening.     . vitamin B-12 (CYANOCOBALAMIN) 1000 MCG tablet Take 1,000 mcg by mouth daily.    Marland Kitchen zolpidem (AMBIEN) 10 MG tablet Take 1 tablet (10 mg total)  by mouth at bedtime. 90 tablet 2  . lamoTRIgine (LAMICTAL) 200 MG tablet Take 1 tablet (200 mg total) by mouth daily. (Patient not taking: Reported on 07/28/2015) 90 tablet 2   No current facility-administered medications on file prior to visit.        Objective:   Physical Exam Blood pressure 156/48, pulse 60, temperature 97.3 F (36.3 C), height 5\' 4"  (1.626 m), weight 179 lb 1.6 oz (81.239 kg). Alert and oriented. Skin warm and dry. Oral mucosa is moist.   . Sclera anicteric, conjunctivae is pink. Thyroid not enlarged. No cervical lymphadenopathy. Lungs clear. Heart regular rate and rhythm.  Abdomen is soft. Bowel sounds are positive. No hepatomegaly. No abdominal masses felt. No tenderness.  No edema to lower extremities.          Assessment & Plan:  Nausea which has resolved since starting the Dicyclomine. She is doing a lot better.

## 2015-07-28 NOTE — Patient Instructions (Signed)
Continue the Dicyclomine. OV in 6 months.

## 2015-09-06 ENCOUNTER — Other Ambulatory Visit (INDEPENDENT_AMBULATORY_CARE_PROVIDER_SITE_OTHER): Payer: Self-pay | Admitting: Internal Medicine

## 2015-09-15 ENCOUNTER — Other Ambulatory Visit (INDEPENDENT_AMBULATORY_CARE_PROVIDER_SITE_OTHER): Payer: Self-pay | Admitting: Internal Medicine

## 2015-10-07 ENCOUNTER — Encounter (HOSPITAL_COMMUNITY): Payer: Self-pay | Admitting: Psychiatry

## 2015-10-07 ENCOUNTER — Ambulatory Visit (INDEPENDENT_AMBULATORY_CARE_PROVIDER_SITE_OTHER): Payer: Medicare Other | Admitting: Psychiatry

## 2015-10-07 VITALS — BP 162/69 | HR 55 | Ht 64.0 in | Wt 179.2 lb

## 2015-10-07 DIAGNOSIS — F3130 Bipolar disorder, current episode depressed, mild or moderate severity, unspecified: Secondary | ICD-10-CM

## 2015-10-07 MED ORDER — ARIPIPRAZOLE 10 MG PO TABS: 10.0000 mg | ORAL_TABLET | Freq: Every day | ORAL | 2 refills | Status: DC

## 2015-10-07 MED ORDER — LAMOTRIGINE 200 MG PO TABS: 200.0000 mg | ORAL_TABLET | Freq: Every day | ORAL | 2 refills | Status: DC

## 2015-10-07 MED ORDER — ZOLPIDEM TARTRATE 10 MG PO TABS: 10.0000 mg | ORAL_TABLET | Freq: Every day | ORAL | 2 refills | Status: DC

## 2015-10-07 NOTE — Progress Notes (Signed)
Patient ID: Lindsay Palmer, female   DOB: 08-May-1940, 75 y.o.   MRN: [id number] Patient ID: Lindsay Palmer, female   DOB: [birth year], 75 y.o.   MRN: [id number] Patient ID: Lindsay Palmer, female   DOB: 1940-07-04, 75 y.o.   MRN: [id number]  Psychiatric  Adult follow-up  Patient Identification: Lindsay Palmer MRN:  [id number] Date of Evaluation:  10/07/2015 Referral Source: [facility] Chief Complaint:   Chief Complaint    Anxiety; Depression; Follow-up     Visit Diagnosis:    ICD-9-CM ICD-10-CM   1. Bipolar I disorder, most recent episode depressed (HCC) 296.50 F31.30    Diagnosis:   Patient Active Problem List   Diagnosis Date Noted  . Bipolar I disorder, most recent episode depressed (HCC) [F31.30] 07/29/2014  . Unspecified constipation [K59.00] 09/05/2013  . 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 Anemia [D64.9] 07/23/2013  . Drug-induced pancreatitis DUE TO CONTRAST [K85.3] 07/19/2013  . Benign essential HTN [I10] 07/19/2013  . Urinary retention [R33.9] 07/19/2013  . Acute pancreatitis [K85.90] 07/19/2013  . Abdominal pain [R10.9] 07/10/2013   History of Present Illness:  This patient is a 75 year old married white female who lives with her husband in [place]. She has a son, daughter and 3 grandchildren. She was working in [employer] prior to her retirement.  The patient was referred by [facility] where she had been receiving treatment. Her physician recently left the practice and they are no longer taking her insurance. She has a long-term history of bipolar disorder.  The patient presents with her husband today. They have been married since she was 75 years old. He states that for much of their marriage she had bouts of severe mood swings. At times she was pleasant and easy to be with the other time she was angry irritable and out of control. She also went through periods of euphoria and spending a lot of money. At one point they went to a family  counselor who suspected she had bipolar disorder. She was eventually referred here to this office and saw Dr. Lolly Mustache. He started her on Abilify and it really helped in terms of decreasing the anger and irritability. Eventually however she started seeing Dr. Tomasa Rand at [facility] because she was still having difficulties with being depressed. He added Lamictal and this seemed to stabilize her mood and she was no longer depressed or manic. The patient has been on this combination of medicines for several years and she seems very stable now. Her only difficulty is that she doesn't sleep well even with the addition of Ambien 10 mg at bedtime.  At times the patient sleeps up to 5 hours at night and sometimes only 2 hours. She's tried almost every medication there is for sleep. Her mood is generally good. However she is often tired in her energy is variable. She had a history of pancreatitis last year after having ERCP and the contrast dye caused the pancreatitis. She still has bouts of nausea and weakness. She's been followed by GI in her laboratories are normal. Her husband thinks she spends too much time in bed. She spends most of her time doing housework. She is stressed because her son and her husband are not getting along . her son is on disability due to a back injury but is trying to work doing Aeronautical engineer which worries her  The patient has never had a psychiatric hospitalization. She does not use drugs or alcohol and has never had any psychotic symptoms such as auditory  or visual hallucinations or paranoia  The patient and her husband return after 4 months. For the most part she is doing okay. She still feels weak at times and not like doing very much. All of her recent labs have been negative. She's going to see a cardiologist soon to assess this. Her feet swell up at times and she has to use Lasix. She's not having other symptoms of heart failure. She states that her mood is in good although  her energy is not the best. She sleeps fairly well with Ambien but some nights doesn't sleep very well Elements:  Location:  Global. Quality:  Waxing and waning. Severity:  Low at this point, well controlled on medications. Timing:  Began when she was around 75 years old. Duration:  Years. Context:  Probably biological. Associated Signs/Symptoms: Depression Symptoms:  depressed mood, insomnia, psychomotor retardation, anxiety, disturbed sleep, (Hypo) Manic Symptoms:  Impulsivity, Irritable Mood,   Past Medical History:  Past Medical History:  Diagnosis Date  . Bipolar 2 disorder (HCC)   . Chronic insomnia   . Diverticulosis of colon   . Elevated LFTs   . Fatty liver disease, nonalcoholic   . GERD (gastroesophageal reflux disease)   . H/O first degree atrioventricular block   . Hypercholesteremia   . Hypertension   . Major depressive disorder (HCC)   . Migraine   . Osteopenia   . Pancreatitis   . Sleep apnea    Stop Bang score of 4. Pt said she had sleep apnea at one time, but she has had another sleep study since then and they said she does not have it anymore.  . Vitamin D deficiency     Past Surgical History:  Procedure Laterality Date  . BILIARY STENT PLACEMENT N/A 07/18/2013   Procedure: BILIARY STENT PLACEMENT;  Surgeon: Malissa HippoNajeeb U Rehman, MD;  Location: [facility];  Service: Endoscopy;  Laterality: N/A;  . Cataract Surgery Bilateral 08-2012  . CHOLECYSTECTOMY    . COLONOSCOPY  2003   Diverticulitis h/o polyps -due next 2015 -gastroscopy 2003  . ERCP    . ERCP N/A 07/18/2013   Procedure: ENDOSCOPIC RETROGRADE CHOLANGIOPANCREATOGRAPHY (ERCP) COMMON BILE DUCT BRUSHING;  Surgeon: Malissa HippoNajeeb U Rehman, MD;  Location: [facility];  Service: Endoscopy;  Laterality: N/A;  . ERCP N/A 11/07/2013   Procedure: ENDOSCOPIC RETROGRADE CHOLANGIOPANCREATOGRAPHY (ERCP);  Surgeon: Malissa HippoNajeeb U Rehman, MD;  Location: [facility];  Service: Endoscopy;  Laterality: N/A;  . EYE SURGERY Bilateral    cataract  removal  . NECK SURGERY     Stenosis C6-C7  . SIGMOIDOSCOPY  2007   no polyps  . SPHINCTEROTOMY N/A 07/18/2013   Procedure: SPHINCTEROTOMY;  Surgeon: Malissa HippoNajeeb U Rehman, MD;  Location: [facility];  Service: Endoscopy;  Laterality: N/A;  . SPHINCTEROTOMY  11/07/2013   Procedure: EXTENSION OF SPHINCTEROTOMY;  Surgeon: Malissa HippoNajeeb U Rehman, MD;  Location: [facility];  Service: Endoscopy;;  . STENT REMOVAL N/A 11/07/2013   Procedure: STENT REMOVAL;  Surgeon: Malissa HippoNajeeb U Rehman, MD;  Location: [facility];  Service: Endoscopy;  Laterality: N/A;   Family History:  Family History  Problem Relation Age of Onset  . Diabetes Mother   . Coronary artery disease Father   . Gout Father   . Breast cancer Sister   . Pancreatic cancer Brother   . Alcohol abuse Brother   . Healthy Brother   . Lung cancer Sister   . Healthy Sister   . Depression Sister   . Hyperlipidemia Daughter   .  Hyperlipidemia Son   . Bipolar disorder Son   . Healthy Sister   . Healthy Sister   . Seizures Other    Social History:   Social History   Social History  . Marital status: Married    Spouse name: N/A  . Number of children: N/A  . Years of education: N/A   Social History Main Topics  . Smoking status: Never Smoker  . Smokeless tobacco: Never Used  . Alcohol use No  . Drug use: No  . Sexual activity: Not Asked   Other Topics Concern  . None   Social History Narrative  . None   Additional Social History: The patient grew up in [place] with both parents. Her parents argued a lot but there was no domestic violence. The patient completed high school but married very young. 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 She denies any history of abuse. She has few interests or hobbies and does not get any exercise  Musculoskeletal: Strength & Muscle Tone: within normal limits Gait & Station: normal Patient leans: N/A  Psychiatric Specialty Exam: Depression          Associated symptoms include insomnia.  Past medical history includes anxiety.   Anxiety  Symptoms include insomnia.      Review of Systems  Gastrointestinal: Positive for abdominal pain.  Psychiatric/Behavioral: Positive for depression. The patient has insomnia.   All other systems reviewed and are negative.   Blood pressure (!) 162/69, pulse (!) 55, height 5\' 4"  (1.626 m), weight 179 lb 3.2 oz (81.3 kg).Body mass index is 30.76 kg/m.  General Appearance: Casual, Neat and Well Groomed  Eye Contact:  Good  Speech:  Clear and Coherent  Volume:  Normal  Mood:  Euthymic  Affect:Fairly bright   Thought Process:  Goal Directed  Orientation:  Full (Time, Place, and Person)  Thought Content:  WDL  Suicidal Thoughts:  No  Homicidal Thoughts:  No  Memory:  Immediate;   Fair Recent;   Fair Remote;   Fair  Judgement:  Good  Insight:  Good  Psychomotor Activity:  Normal  Concentration:  Fair  Recall:  Fair  Fund of Knowledge:Good  Language: Good  Akathisia:  No  Handed:  Right  AIMS (if indicated):    Assets:  Communication Skills Desire for Improvement Leisure Time Resilience Social Support  ADL's:  Intact  Cognition: WNL  Sleep:  Irritable but not as good as it should be    Is the patient at risk to self?  No. Has the patient been a risk to self in the past 6 months?  No. Has the patient been a risk to self within the distant past?  No. Is the patient a risk to others?  No. Has the patient been a risk to others in the past 6 months?  No. Has the patient been a risk to others within the distant past?  No.  Allergies:   Allergies  Allergen Reactions  . Codeine Nausea And Vomiting  . Oxycodone    Current Medications: Current Outpatient Prescriptions  Medication Sig Dispense Refill  . amLODipine (NORVASC) 5 MG tablet Take by mouth. 1  1/2 tab daily.    . ARIPiprazole (ABILIFY) 10 MG tablet Take 1 tablet (10 mg total) by mouth at bedtime. 100 tablet 2  . calcium  carbonate (TUMS - DOSED IN MG ELEMENTAL CALCIUM) 500 MG chewable tablet Chew 1  tablet by mouth 2 (two) times daily.    . Cholecalciferol (VITAMIN D) 400 UNITS capsule Take 400 Units by mouth daily.    Marland Kitchen dicyclomine (BENTYL) 10 MG capsule TAKE ONE CAPSULE BY MOUTH TWICE DAILY WITH BREAKFAST AND LUNCH 60 capsule 5  . Fish Oil-Cholecalciferol (FISH OIL + D3 PO) Take 1,200 mg by mouth 2 (two) times daily.     . furosemide (LASIX) 40 MG tablet TAKE 1 TABLET BY MOUTH EVERY DAY AS NEEDED 30 tablet 1  . HYDROcodone-acetaminophen (LORTAB) 5-325 MG per tablet Take 1 tablet by mouth every 6 (six) hours as needed for moderate pain. 60 tablet 0  . lamoTRIgine (LAMICTAL) 200 MG tablet Take 1 tablet (200 mg total) by mouth daily. 90 tablet 2  . losartan (COZAAR) 100 MG tablet Take 100 mg by mouth daily.    . meloxicam (MOBIC) 15 MG tablet Take 15 mg by mouth daily.    . metoprolol tartrate (LOPRESSOR) 25 MG tablet Take 12.5 mg by mouth 2 (two) times daily.     Marland Kitchen omeprazole (PRILOSEC) 20 MG capsule Take 20 mg by mouth daily.    . ondansetron (ZOFRAN) 4 MG tablet TAKE 1 TABLET BY MOUTH EVERY 8 HOURS AS NEEDED FOR NAUSEA OR VOMITING 30 tablet 0  . OVER THE COUNTER MEDICATION Fiber well - 5 grams 1 chewable tablet twice daily.    . polyethylene glycol powder (GLYCOLAX/MIRALAX) powder TAKE 8.5 GRAMS(1/2 CAPFUL) MIXED IN FLUID AND DRANK EVERY OTHER DAY 1581 g 3  . potassium chloride (K-DUR) 10 MEQ tablet TAKE 1 TABLET BY MOUTH EVERY DAY AS NEEDED 30 tablet 4  . simvastatin (ZOCOR) 20 MG tablet Take 20 mg by mouth every evening.     . vitamin B-12 (CYANOCOBALAMIN) 1000 MCG tablet Take 1,000 mcg by mouth daily.    Marland Kitchen zolpidem (AMBIEN) 10 MG tablet Take 1 tablet (10 mg total) by mouth at bedtime. 90 tablet 2   No current facility-administered medications for this visit.     Previous Psychotropic Medications: Yes   Substance Abuse History in the last 12 months:  No.  Consequences of Substance Abuse: NA  Medical  Decision Making:  Established Problem, Stable/Improving (1), Review or order clinical lab tests (1), Decision to obtain old records (1), Review and summation of old records (2) and Review of Medication Regimen & Side Effects (2)  Treatment Plan Summary: Medication management  The patient seems to be stable on her current combination of Abilify and Lamictal for bipolar disorder. These medications will be continued. The Ambien 10 mg will be continued at bedtime for sleep She'll return to see me in 3 months but call if any of her symptoms worsen.    [place], Gavin Pound 9/7/20173:35 PM

## 2015-10-12 ENCOUNTER — Other Ambulatory Visit (INDEPENDENT_AMBULATORY_CARE_PROVIDER_SITE_OTHER): Payer: Self-pay | Admitting: Internal Medicine

## 2015-11-04 ENCOUNTER — Other Ambulatory Visit (INDEPENDENT_AMBULATORY_CARE_PROVIDER_SITE_OTHER): Payer: Self-pay | Admitting: Internal Medicine

## 2016-01-06 ENCOUNTER — Ambulatory Visit (INDEPENDENT_AMBULATORY_CARE_PROVIDER_SITE_OTHER): Payer: Medicare Other | Admitting: Psychiatry

## 2016-01-06 ENCOUNTER — Encounter (HOSPITAL_COMMUNITY): Payer: Self-pay | Admitting: Psychiatry

## 2016-01-06 VITALS — BP 150/52 | HR 63 | Ht 64.0 in | Wt 179.8 lb

## 2016-01-06 DIAGNOSIS — Z808 Family history of malignant neoplasm of other organs or systems: Secondary | ICD-10-CM | POA: Diagnosis not present

## 2016-01-06 DIAGNOSIS — Z818 Family history of other mental and behavioral disorders: Secondary | ICD-10-CM

## 2016-01-06 DIAGNOSIS — Z803 Family history of malignant neoplasm of breast: Secondary | ICD-10-CM

## 2016-01-06 DIAGNOSIS — Z8349 Family history of other endocrine, nutritional and metabolic diseases: Secondary | ICD-10-CM

## 2016-01-06 DIAGNOSIS — F3130 Bipolar disorder, current episode depressed, mild or moderate severity, unspecified: Secondary | ICD-10-CM

## 2016-01-06 DIAGNOSIS — Z833 Family history of diabetes mellitus: Secondary | ICD-10-CM

## 2016-01-06 DIAGNOSIS — Z8489 Family history of other specified conditions: Secondary | ICD-10-CM

## 2016-01-06 MED ORDER — LAMOTRIGINE 200 MG PO TABS: 200.0000 mg | ORAL_TABLET | Freq: Every day | ORAL | 2 refills | Status: DC

## 2016-01-06 MED ORDER — ARIPIPRAZOLE 10 MG PO TABS: 10.0000 mg | ORAL_TABLET | Freq: Every day | ORAL | 2 refills | Status: DC

## 2016-01-06 NOTE — Progress Notes (Signed)
Patient ID: Lindsay Palmer, female   DOB: 28-Jul-1940, 75 y.o.   MRN: [id number] Patient ID: Lindsay Palmer, female   DOB: 01/29/1941, 75 y.o.   MRN: [id number] Patient ID: Lindsay Palmer, female   DOB: 1941-01-29, 75 y.o.   MRN: [id number]  Psychiatric  Adult follow-up  Patient Identification: Lindsay Palmer MRN:  [id number] Date of Evaluation:  01/06/2016 Referral Source: [facility] Chief Complaint:   Chief Complaint    Depression; Anxiety; Follow-up     Visit Diagnosis:    ICD-9-CM ICD-10-CM   1. Bipolar I disorder, most recent episode depressed (HCC) 296.50 F31.30    Diagnosis:   Patient Active Problem List   Diagnosis Date Noted  . Bipolar I disorder, most recent episode depressed (HCC) [F31.30] 07/29/2014  . Unspecified constipation [K59.00] 09/05/2013  . 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 Anemia [D64.9] 07/23/2013  . Drug-induced pancreatitis DUE TO CONTRAST [K85.30] 07/19/2013  . Benign essential HTN [I10] 07/19/2013  . Urinary retention [R33.9] 07/19/2013  . Acute pancreatitis [K85.90] 07/19/2013  . Abdominal pain [R10.9] 07/10/2013   History of Present Illness:  This patient is a 75 year old married white female who lives with her husband in [place]. She has a son, daughter and 3 grandchildren. She was working in [employer] prior to her retirement.  The patient was [facility] by [facility] where she had been receiving treatment. Her physician recently left the practice and they are no longer taking her insurance. She has a long-term history of bipolar disorder.  The patient presents with her husband today. They have been married since she was 75 years old. He states that for much of their marriage she had bouts of severe mood swings. At times she was pleasant and easy to be with the other time she was angry irritable and out of control. She also went through periods of euphoria and spending a lot of money. At one point they went to a family  counselor who suspected she had bipolar disorder. She was eventually referred here to this office and saw Dr. Lolly Mustache. He started her on Abilify and it really helped in terms of decreasing the anger and irritability. Eventually however she started seeing Dr. Tomasa Rand at [facility] because she was still having difficulties with being depressed. He added Lamictal and this seemed to stabilize her mood and she was no longer depressed or manic. The patient has been on this combination of medicines for several years and she seems very stable now. Her only difficulty is that she doesn't sleep well even with the addition of Ambien 10 mg at bedtime.  At times the patient sleeps up to 5 hours at night and sometimes only 2 hours. She's tried almost every medication there is for sleep. Her mood is generally good. However she is often tired in her energy is variable. She had a history of pancreatitis last year after having ERCP and the contrast dye caused the pancreatitis. She still has bouts of nausea and weakness. She's been followed by GI in her laboratories are normal. Her husband thinks she spends too much time in bed. She spends most of her time doing housework. She is stressed because her son and her husband are not getting along . her son is on disability due to a back injury but is trying to work doing Aeronautical engineer which worries her  The patient has never had a psychiatric hospitalization. She does not use drugs or alcohol and has never had any psychotic symptoms such as auditory  or visual hallucinations or paranoia  The patient and her husband return after 3 months. She notes that she is hearing a "hissing noise" at night and it's been difficult for her to go to sleep her husband has searched the room and can't find anything causing the noise. She's been sleeping out on the couch. Interestingly when her husband is with her she doesn't hear the noise. The family has been through more stress because her  daughter was diagnosed with a meningioma and had brain surgery a couple of months ago and now is going through radiation. She denies auditory or visual hallucinations or mood swings and states that her mood is good and denies being depressed or suicidal. She thinks that her medications are generally helpful and once she goes to sleep she sleeps with the whole night Elements:  Location:  Global. Quality:  Waxing and waning. Severity:  Low at this point, well controlled on medications. Timing:  Began when she was around 75 years old. Duration:  Years. Context:  Probably biological. Associated Signs/Symptoms: Depression Symptoms:  depressed mood, insomnia, psychomotor retardation, anxiety, disturbed sleep, (Hypo) Manic Symptoms:  Impulsivity, Irritable Mood,   Past Medical History:  Past Medical History:  Diagnosis Date  . Bipolar 2 disorder (HCC)   . Chronic insomnia   . Diverticulosis of colon   . Elevated LFTs   . Fatty liver disease, nonalcoholic   . GERD (gastroesophageal reflux disease)   . H/O first degree atrioventricular block   . Hypercholesteremia   . Hypertension   . Major depressive disorder   . Migraine   . Osteopenia   . Pancreatitis   . Sleep apnea    Stop Bang score of 4. Pt said she had sleep apnea at one time, but she has had another sleep study since then and they said she does not have it anymore.  . Vitamin D deficiency     Past Surgical History:  Procedure Laterality Date  . BILIARY STENT PLACEMENT N/A 07/18/2013   Procedure: BILIARY STENT PLACEMENT;  Surgeon: Malissa HippoNajeeb U Rehman, MD;  Location: [facility];  Service: Endoscopy;  Laterality: N/A;  . Cataract Surgery Bilateral 08-2012  . CHOLECYSTECTOMY    . COLONOSCOPY  2003   Diverticulitis h/o polyps -due next 2015 -gastroscopy 2003  . ERCP    . ERCP N/A 07/18/2013   Procedure: ENDOSCOPIC RETROGRADE CHOLANGIOPANCREATOGRAPHY (ERCP) COMMON BILE DUCT BRUSHING;  Surgeon: Malissa HippoNajeeb U Rehman, MD;  Location: [facility];   Service: Endoscopy;  Laterality: N/A;  . ERCP N/A 11/07/2013   Procedure: ENDOSCOPIC RETROGRADE CHOLANGIOPANCREATOGRAPHY (ERCP);  Surgeon: Malissa HippoNajeeb U Rehman, MD;  Location: [facility];  Service: Endoscopy;  Laterality: N/A;  . EYE SURGERY Bilateral    cataract removal  . NECK SURGERY     Stenosis C6-C7  . SIGMOIDOSCOPY  2007   no polyps  . SPHINCTEROTOMY N/A 07/18/2013   Procedure: SPHINCTEROTOMY;  Surgeon: Malissa HippoNajeeb U Rehman, MD;  Location: [facility];  Service: Endoscopy;  Laterality: N/A;  . SPHINCTEROTOMY  11/07/2013   Procedure: EXTENSION OF SPHINCTEROTOMY;  Surgeon: Malissa HippoNajeeb U Rehman, MD;  Location: [facility];  Service: Endoscopy;;  . STENT REMOVAL N/A 11/07/2013   Procedure: STENT REMOVAL;  Surgeon: Malissa HippoNajeeb U Rehman, MD;  Location: [facility];  Service: Endoscopy;  Laterality: N/A;   Family History:  Family History  Problem Relation Age of Onset  . Diabetes Mother   . Coronary artery disease Father   . Gout Father   . Breast cancer Sister   .  Pancreatic cancer Brother   . Alcohol abuse Brother   . Healthy Brother   . Lung cancer Sister   . Healthy Sister   . Depression Sister   . Hyperlipidemia Daughter   . Hyperlipidemia Son   . Bipolar disorder Son   . Healthy Sister   . Healthy Sister   . Seizures Other    Social History:   Social History   Social History  . Marital status: Married    Spouse name: N/A  . Number of children: N/A  . Years of education: N/A   Social History Main Topics  . Smoking status: Never Smoker  . Smokeless tobacco: Never Used  . Alcohol use No  . Drug use: No  . Sexual activity: Not Asked   Other Topics Concern  . None   Social History Narrative  . None   Additional Social History: The patient grew up in [place] with both parents. Her parents argued a lot but there was no domestic violence. The patient completed high school but married very young. 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  She denies any history of abuse. She has few interests or hobbies and does not get any exercise  Musculoskeletal: Strength & Muscle Tone: within normal limits Gait & Station: normal Patient leans: N/A  Psychiatric Specialty Exam: Anxiety  Symptoms include insomnia.    Depression         Associated symptoms include insomnia.  Past medical history includes anxiety.     Review of Systems  Gastrointestinal: Positive for abdominal pain.  Psychiatric/Behavioral: Positive for depression. The patient has insomnia.   All other systems reviewed and are negative.   Blood pressure (!) 150/52, pulse 63, height 5\' 4"  (1.626 m), weight 179 lb 12.8 oz (81.6 kg).Body mass index is 30.86 kg/m.  General Appearance: Casual, Neat and Well Groomed  Eye Contact:  Good  Speech:  Clear and Coherent  Volume:  Normal  Mood:  Euthymic little anxious   Affect:Fairly bright   Thought Process:  Goal Directed  Orientation:  Full (Time, Place, and Person)  Thought Content:  WDL  Suicidal Thoughts:  No  Homicidal Thoughts:  No  Memory:  Immediate;   Fair Recent;   Fair Remote;   Fair  Judgement:  Good  Insight:  Good  Psychomotor Activity:  Normal  Concentration:  Fair  Recall:  Fair  Fund of Knowledge:Good  Language: Good  Akathisia:  No  Handed:  Right  AIMS (if indicated):    Assets:  Communication Skills Desire for Improvement Leisure Time Resilience Social Support  ADL's:  Intact  Cognition: WNL  Sleep:  Irritable but not as good as it should be    Is the patient at risk to self?  No. Has the patient been a risk to self in the past 6 months?  No. Has the patient been a risk to self within the distant past?  No. Is the patient a risk to others?  No. Has the patient been a risk to others in the past 6 months?  No. Has the patient been a risk to others within the distant past?  No.  Allergies:   Allergies  Allergen Reactions  . Codeine Nausea And Vomiting  . Oxycodone    Current  Medications: Current Outpatient Prescriptions  Medication Sig Dispense Refill  . amLODipine (NORVASC) 5 MG tablet Take by mouth. 1  1/2 tab  daily.    . ARIPiprazole (ABILIFY) 10 MG tablet Take 1 tablet (10 mg total) by mouth at bedtime. 100 tablet 2  . Cholecalciferol (VITAMIN D) 400 UNITS capsule Take 400 Units by mouth daily.    Marland Kitchen dicyclomine (BENTYL) 10 MG capsule TAKE ONE CAPSULE BY MOUTH TWICE DAILY WITH BREAKFAST AND LUNCH 60 capsule 5  . Fish Oil-Cholecalciferol (FISH OIL + D3 PO) Take 1,200 mg by mouth 2 (two) times daily.     . furosemide (LASIX) 40 MG tablet TAKE 1 TABLET BY MOUTH EVERY DAY AS NEEDED 30 tablet 1  . HYDROcodone-acetaminophen (LORTAB) 5-325 MG per tablet Take 1 tablet by mouth every 6 (six) hours as needed for moderate pain. 60 tablet 0  . lamoTRIgine (LAMICTAL) 200 MG tablet Take 1 tablet (200 mg total) by mouth daily. 90 tablet 2  . losartan (COZAAR) 100 MG tablet Take 100 mg by mouth daily.    . meloxicam (MOBIC) 15 MG tablet Take 15 mg by mouth daily.    . metoprolol tartrate (LOPRESSOR) 25 MG tablet Take 12.5 mg by mouth 2 (two) times daily.     Marland Kitchen omeprazole (PRILOSEC) 20 MG capsule Take 20 mg by mouth daily.    . ondansetron (ZOFRAN) 4 MG tablet TAKE 1 TABLET BY MOUTH EVERY 8 HOURS AS NEEDED FOR NAUSEA OR VOMITING 30 tablet 0  . OVER THE COUNTER MEDICATION Fiber well - 5 grams 1 chewable tablet twice daily.    . polyethylene glycol powder (GLYCOLAX/MIRALAX) powder TAKE 8.5 GRAMS(1/2 CAPFUL) MIXED IN FLUID AND DRANK EVERY OTHER DAY 1581 g 3  . potassium chloride (K-DUR) 10 MEQ tablet TAKE 1 TABLET BY MOUTH EVERY DAY AS NEEDED 30 tablet 3  . simvastatin (ZOCOR) 20 MG tablet Take 20 mg by mouth every evening.     . vitamin B-12 (CYANOCOBALAMIN) 1000 MCG tablet Take 1,000 mcg by mouth daily.    Marland Kitchen zolpidem (AMBIEN) 10 MG tablet Take 1 tablet (10 mg total) by mouth at bedtime. 90 tablet 2  . calcium carbonate (TUMS - DOSED IN MG ELEMENTAL CALCIUM) 500 MG chewable tablet  Chew 1 tablet by mouth 2 (two) times daily.     No current facility-administered medications for this visit.     Previous Psychotropic Medications: Yes   Substance Abuse History in the last 12 months:  No.  Consequences of Substance Abuse: NA  Medical Decision Making:  Established Problem, Stable/Improving (1), Review or order clinical lab tests (1), Decision to obtain old records (1), Review and summation of old records (2) and Review of Medication Regimen & Side Effects (2)  Treatment Plan Summary: Medication management  The patient seems to be stable on her current combination of Abilify and Lamictal for bipolar disorder. These medications will be continued. The Ambien 10 mg will be continued at bedtime for sleep She'll return to see me in 3 months but call if any of her symptoms worsen.I think the noise she hears is most likely secondary to anxiety and doesn't represent any sort of psychotic event    Tenny Craw [facility] 12/7/20173:31 PM Patient ID: Ikia Cincotta, female   DOB: [birth year], 75 y.o.   MRN: [id number]

## 2016-01-17 ENCOUNTER — Telehealth (INDEPENDENT_AMBULATORY_CARE_PROVIDER_SITE_OTHER): Payer: Self-pay | Admitting: Internal Medicine

## 2016-01-17 NOTE — Telephone Encounter (Signed)
Patient called, she would like a refill on Omeprazole called to [facility] [address] in [place].  [phone]

## 2016-01-18 ENCOUNTER — Telehealth (INDEPENDENT_AMBULATORY_CARE_PROVIDER_SITE_OTHER): Payer: Self-pay | Admitting: Internal Medicine

## 2016-01-18 MED ORDER — OMEPRAZOLE 20 MG PO CPDR: 20.0000 mg | DELAYED_RELEASE_CAPSULE | Freq: Every day | ORAL | 3 refills | Status: DC

## 2016-01-18 NOTE — Telephone Encounter (Signed)
error 

## 2016-01-18 NOTE — Telephone Encounter (Signed)
I called the patient and advised her. °

## 2016-01-18 NOTE — Telephone Encounter (Signed)
Let patient know I have sent Rx 

## 2016-01-18 NOTE — Telephone Encounter (Signed)
Rx for Omeprazole sent to her pharmacy 

## 2016-01-19 ENCOUNTER — Telehealth (INDEPENDENT_AMBULATORY_CARE_PROVIDER_SITE_OTHER): Payer: Self-pay | Admitting: Internal Medicine

## 2016-01-19 DIAGNOSIS — K219 Gastro-esophageal reflux disease without esophagitis: Secondary | ICD-10-CM

## 2016-01-19 MED ORDER — OMEPRAZOLE 20 MG PO CPDR: 20.0000 mg | DELAYED_RELEASE_CAPSULE | Freq: Two times a day (BID) | ORAL | 3 refills | Status: DC

## 2016-01-19 NOTE — Telephone Encounter (Signed)
Rx sent to her phamacy 

## 2016-01-27 ENCOUNTER — Ambulatory Visit (INDEPENDENT_AMBULATORY_CARE_PROVIDER_SITE_OTHER): Payer: Self-pay | Admitting: Internal Medicine

## 2016-01-31 HISTORY — PX: PARTIAL COLECTOMY: SHX5273

## 2016-02-02 ENCOUNTER — Ambulatory Visit (INDEPENDENT_AMBULATORY_CARE_PROVIDER_SITE_OTHER): Payer: Medicare Other | Admitting: Internal Medicine

## 2016-02-02 ENCOUNTER — Encounter (INDEPENDENT_AMBULATORY_CARE_PROVIDER_SITE_OTHER): Payer: Self-pay | Admitting: Internal Medicine

## 2016-02-02 VITALS — BP 134/70 | HR 76 | Temp 98.0°F | Ht 64.0 in | Wt 177.6 lb

## 2016-02-02 DIAGNOSIS — R110 Nausea: Secondary | ICD-10-CM | POA: Diagnosis not present

## 2016-02-02 NOTE — Patient Instructions (Signed)
Labs today. OV in 6 months.  

## 2016-02-02 NOTE — Progress Notes (Signed)
Subjective:    Patient ID: Lindsay Palmer, female    DOB: 1940/12/07, 76 y.o.   MRN: [id number]  HPI Here today for f/u. She was last seen in June of this by me. Hx of nausea. Hx of CBD stricture and post ERCP pancreatitis in June of 2015. She tells me she was sick for about 5 days. She was lightheaded, nausea. She had diarrhea and abdominal pain. She was having 3 loose stools a dayl.  There was no fever. She says she feels better today.  There is no abdominal pain.  No nausea.  Appetite is good. She has a BM x 1 a day.  She feels good today.   05/25/2015 CT abdomen/pelvis with CM: Nausea; abdominal pain IMPRESSION: Left colonic diverticulosis. No active diverticulitis. Moderate stool burden in the colon.  No acute findings in the abdomen or pelvis.  02/15/2015 Gastric emptying study: normal.     Her last colonoscopy was in 2008 by Dr. West Carbo. Indications: Prior ascending colon polyp per Dr. Posey Pronto in 2000. Findings: Diverticulosis of sigmoid colon. No further polyhpoid disease.   02/15/2015 NM Gastric emptying study: nausea  IMPRESSION: Normal gastric emptying study. Slightly more residual within the stomach at 4 hours.  Hepatic Function Latest Ref Rng & Units 05/18/2015 09/14/2014 05/19/2014  Total Protein 6.1 - 8.1 g/dL 7.0 7.2 7.0  Albumin 3.6 - 5.1 g/dL 4.2 4.0 4.1  AST 10 - 35 U/L '22 23 19  '$ ALT 6 - 29 U/L '23 24 18  '$ Alk Phosphatase 33 - 130 U/L 135(H) 133(H) 141(H)  Total Bilirubin 0.2 - 1.2 mg/dL 0.3 0.3 0.3  Bilirubin, Direct <=0.2 mg/dL 0.1 0.1 0.1       Review of Systems   Past Medical History:  Diagnosis Date  . Bipolar 2 disorder ([place])   . Chronic insomnia   . Diverticulosis of colon   . Elevated LFTs   . Fatty liver disease, nonalcoholic   . GERD (gastroesophageal reflux disease)   . H/O first degree atrioventricular block   . Hypercholesteremia   . Hypertension   . Major depressive disorder   . Migraine   . Osteopenia   . Pancreatitis   .  Sleep apnea    Stop Bang score of 4. Pt said she had sleep apnea at one time, but she has had another sleep study since then and they said she does not have it anymore.  . Vitamin D deficiency     Past Surgical History:  Procedure Laterality Date  . BILIARY STENT PLACEMENT N/A 07/18/2013   Procedure: BILIARY STENT PLACEMENT;  Surgeon: Rogene Houston, MD;  Location: [facility];  Service: Endoscopy;  Laterality: N/A;  . Cataract Surgery Bilateral 08-2012  . CHOLECYSTECTOMY    . COLONOSCOPY  2003   Diverticulitis h/o polyps -due next 2015 -gastroscopy 2003  . ERCP    . ERCP N/A 07/18/2013   Procedure: ENDOSCOPIC RETROGRADE CHOLANGIOPANCREATOGRAPHY (ERCP) COMMON BILE DUCT BRUSHING;  Surgeon: Rogene Houston, MD;  Location: [facility];  Service: Endoscopy;  Laterality: N/A;  . ERCP N/A 11/07/2013   Procedure: ENDOSCOPIC RETROGRADE CHOLANGIOPANCREATOGRAPHY (ERCP);  Surgeon: Rogene Houston, MD;  Location: [facility];  Service: Endoscopy;  Laterality: N/A;  . EYE SURGERY Bilateral    cataract removal  . NECK SURGERY     Stenosis C6-C7  . SIGMOIDOSCOPY  2007   no polyps  . SPHINCTEROTOMY N/A 07/18/2013   Procedure: SPHINCTEROTOMY;  Surgeon: Rogene Houston, MD;  Location: [facility];  Service:  Endoscopy;  Laterality: N/A;  . SPHINCTEROTOMY  11/07/2013   Procedure: EXTENSION OF SPHINCTEROTOMY;  Surgeon: Rogene Houston, MD;  Location: [facility];  Service: Endoscopy;;  . STENT REMOVAL N/A 11/07/2013   Procedure: STENT REMOVAL;  Surgeon: Rogene Houston, MD;  Location: [facility];  Service: Endoscopy;  Laterality: N/A;    Allergies  Allergen Reactions  . Codeine Nausea And Vomiting  . Oxycodone     Current Outpatient Prescriptions on File Prior to Visit  Medication Sig Dispense Refill  . amLODipine (NORVASC) 5 MG tablet Take by mouth. 1  1/2 tab daily.    . ARIPiprazole (ABILIFY) 10 MG tablet Take 1 tablet (10 mg total) by mouth at bedtime. 100 tablet 2  . calcium carbonate (TUMS - DOSED IN MG ELEMENTAL CALCIUM)  500 MG chewable tablet Chew 1 tablet by mouth 2 (two) times daily.    . Cholecalciferol (VITAMIN D) 400 UNITS capsule Take 400 Units by mouth daily.    Marland Kitchen dicyclomine (BENTYL) 10 MG capsule TAKE ONE CAPSULE BY MOUTH TWICE DAILY WITH BREAKFAST AND LUNCH 60 capsule 5  . Fish Oil-Cholecalciferol (FISH OIL + D3 PO) Take 1,200 mg by mouth 2 (two) times daily.     . furosemide (LASIX) 40 MG tablet TAKE 1 TABLET BY MOUTH EVERY DAY AS NEEDED 30 tablet 1  . HYDROcodone-acetaminophen (LORTAB) 5-325 MG per tablet Take 1 tablet by mouth every 6 (six) hours as needed for moderate pain. 60 tablet 0  . lamoTRIgine (LAMICTAL) 200 MG tablet Take 1 tablet (200 mg total) by mouth daily. 90 tablet 2  . losartan (COZAAR) 100 MG tablet Take 100 mg by mouth daily.    . meloxicam (MOBIC) 15 MG tablet Take 15 mg by mouth daily.    . metoprolol tartrate (LOPRESSOR) 25 MG tablet Take 12.5 mg by mouth 2 (two) times daily.     Marland Kitchen omeprazole (PRILOSEC) 20 MG capsule Take 1 capsule (20 mg total) by mouth 2 (two) times daily before a meal. 180 capsule 3  . ondansetron (ZOFRAN) 4 MG tablet TAKE 1 TABLET BY MOUTH EVERY 8 HOURS AS NEEDED FOR NAUSEA OR VOMITING 30 tablet 0  . OVER THE COUNTER MEDICATION Fiber well - 5 grams 1 chewable tablet twice daily.    . polyethylene glycol powder (GLYCOLAX/MIRALAX) powder TAKE 8.5 GRAMS(1/2 CAPFUL) MIXED IN FLUID AND DRANK EVERY OTHER DAY 1581 g 3  . potassium chloride (K-DUR) 10 MEQ tablet TAKE 1 TABLET BY MOUTH EVERY DAY AS NEEDED 30 tablet 3  . simvastatin (ZOCOR) 20 MG tablet Take 20 mg by mouth every evening.     . vitamin B-12 (CYANOCOBALAMIN) 1000 MCG tablet Take 1,000 mcg by mouth daily.    Marland Kitchen zolpidem (AMBIEN) 10 MG tablet Take 1 tablet (10 mg total) by mouth at bedtime. 90 tablet 2   No current facility-administered medications on file prior to visit.        Objective:   Physical Exam Blood pressure 134/70, pulse 76, temperature 98 F (36.7 C), height '5\' 4"'$  (1.626 m), weight 177  lb 9.6 oz (80.6 kg).  Alert and oriented. Skin warm and dry. Oral mucosa is moist.   . Sclera anicteric, conjunctivae is pink. Thyroid not enlarged. No cervical lymphadenopathy. Lungs clear. Heart regular rate and rhythm.  Abdomen is soft. Bowel sounds are positive. No hepatomegaly. No abdominal masses felt. No tenderness.  No edema to lower extremities.         Assessment & Plan:  Chronic nausea.  She has not had nausea for a few days. She feels good today. ? Viral gastroenteritis last week. Will get a CBC and Hepatic function today OV in 6 months.

## 2016-02-03 ENCOUNTER — Telehealth (INDEPENDENT_AMBULATORY_CARE_PROVIDER_SITE_OTHER): Payer: Self-pay | Admitting: Internal Medicine

## 2016-02-03 LAB — CBC WITH DIFFERENTIAL/PLATELET
Basophils Absolute: 0 cells/uL (ref 0–200)
Basophils Relative: 0 %
EOS PCT: 2 %
Eosinophils Absolute: 108 cells/uL (ref 15–500)
HEMATOCRIT: 36.6 % (ref 35.0–45.0)
Hemoglobin: 11.7 g/dL (ref 11.7–15.5)
LYMPHS PCT: 33 %
Lymphs Abs: 1782 cells/uL (ref 850–3900)
MCH: 28.1 pg (ref 27.0–33.0)
MCHC: 32 g/dL (ref 32.0–36.0)
MCV: 88 fL (ref 80.0–100.0)
MONO ABS: 648 {cells}/uL (ref 200–950)
MONOS PCT: 12 %
MPV: 11 fL (ref 7.5–12.5)
NEUTROS PCT: 53 %
Neutro Abs: 2862 cells/uL (ref 1500–7800)
Platelets: 237 10*3/uL (ref 140–400)
RBC: 4.16 MIL/uL (ref 3.80–5.10)
RDW: 14.6 % (ref 11.0–15.0)
WBC: 5.4 10*3/uL (ref 3.8–10.8)

## 2016-02-03 LAB — HEPATIC FUNCTION PANEL
ALT: 18 U/L (ref 6–29)
AST: 17 U/L (ref 10–35)
Albumin: 3.9 g/dL (ref 3.6–5.1)
Alkaline Phosphatase: 108 U/L (ref 33–130)
Bilirubin, Direct: 0.1 mg/dL (ref <=0.2)
Indirect Bilirubin: 0.2 mg/dL (ref 0.2–1.2)
Total Bilirubin: 0.3 mg/dL (ref 0.2–1.2)
Total Protein: 6.5 g/dL (ref 6.1–8.1)

## 2016-02-03 NOTE — Telephone Encounter (Signed)
error 

## 2016-02-10 ENCOUNTER — Telehealth (HOSPITAL_COMMUNITY): Payer: Self-pay | Admitting: *Deleted

## 2016-02-10 NOTE — Telephone Encounter (Signed)
Voice message regarding prior auth. for patient.   Please call.  name was not provided.

## 2016-02-15 ENCOUNTER — Telehealth (INDEPENDENT_AMBULATORY_CARE_PROVIDER_SITE_OTHER): Payer: Self-pay | Admitting: Internal Medicine

## 2016-02-15 NOTE — Telephone Encounter (Signed)
Called caller back that lm for pt which was at [phone]. When RMA called number, automatic system needed pt [employer] medicare information but office do not have any record of pt being on this insurance. Called pt mobile on file but it was d/c. Called pt home number on file and spoke with pt and she wanted RMA to to speak with her husband due to him being taking care of all of her information. Spoke with pt husband and informed him office received information from the insurance company but no information was left other then their number. Per pt husband he did not want pt to run out of her generic Ambien so he started a PA for her. Pt husband then stated at the beginning of the year they switched prescription insurance and now they have [employer] Medicare. Husband then provided RMA with insurance ID number which is [id number] and Rx Bin number [id number]. Called insurance number on previous call and spoke with Alcario DroughtErica at 10:05am. Per Alcario DroughtErica, pt is already approved for her Zolpidem and it will last until 01-29-2017. Per Alcario DroughtErica she will fax office authorization approval letter shortly after call. RMA agreed and called pt husband back and informed him with information. RMA then informed pt husband to call pt pharmacy and let them know. Per pt husband he will and verbalized understanding.

## 2016-02-15 NOTE — Telephone Encounter (Signed)
Patient's spouse, Fayrene FearingJames called and is requesting a refill on Hydrocodone for his wife.  He is requesting we mail the prescription to them, because they live in [place].  [phone]

## 2016-02-18 ENCOUNTER — Other Ambulatory Visit (INDEPENDENT_AMBULATORY_CARE_PROVIDER_SITE_OTHER): Payer: Self-pay | Admitting: *Deleted

## 2016-02-18 DIAGNOSIS — K863 Pseudocyst of pancreas: Secondary | ICD-10-CM

## 2016-02-18 MED ORDER — HYDROCODONE-ACETAMINOPHEN 5-325 MG PO TABS: 1.0000 | ORAL_TABLET | Freq: Four times a day (QID) | ORAL | 0 refills | Status: DC | PRN

## 2016-02-18 NOTE — Telephone Encounter (Signed)
Rx written upon Dr.Rehman's approval. Patient will be called Monday ,02/21/2016.

## 2016-02-18 NOTE — Telephone Encounter (Signed)
Patient's husband called and he is asking that her pain medication be filled. It was last written 01/15/2014. Per Dr.Rehman we may write this Rx. Patient will be made aware.

## 2016-02-28 ENCOUNTER — Telehealth (INDEPENDENT_AMBULATORY_CARE_PROVIDER_SITE_OTHER): Payer: Self-pay | Admitting: *Deleted

## 2016-02-28 NOTE — Telephone Encounter (Signed)
A PA /Appeal has been done,again, for Omeprazole 20 mg - take 1 by mouth twice a day,number 60/30 days. Per Vincente LibertySherry H in [place].  Whom I did the appeal with, this is being done as expedited and we will have a answer in 72 hours. Patient was called and given this update.

## 2016-03-15 ENCOUNTER — Telehealth (INDEPENDENT_AMBULATORY_CARE_PROVIDER_SITE_OTHER): Payer: Self-pay | Admitting: *Deleted

## 2016-03-15 NOTE — Telephone Encounter (Signed)
Danae from [facility] in [place] called and ask for a new Rx for the Zofran as the patient has changed pharmacy. Per Dorene Arerri Setzer, NP may call in the Zofran 4 mg take 1 by mouth every 8 hours prn for nausea or vomiting. #30 2 refills. This was called back to [place].

## 2016-04-03 ENCOUNTER — Other Ambulatory Visit (INDEPENDENT_AMBULATORY_CARE_PROVIDER_SITE_OTHER): Payer: Self-pay | Admitting: Internal Medicine

## 2016-04-05 ENCOUNTER — Ambulatory Visit (INDEPENDENT_AMBULATORY_CARE_PROVIDER_SITE_OTHER): Payer: Medicare Other | Admitting: Psychiatry

## 2016-04-05 ENCOUNTER — Encounter (HOSPITAL_COMMUNITY): Payer: Self-pay | Admitting: Psychiatry

## 2016-04-05 VITALS — BP 166/50 | HR 66 | Ht 64.0 in | Wt 180.2 lb

## 2016-04-05 DIAGNOSIS — Z811 Family history of alcohol abuse and dependence: Secondary | ICD-10-CM | POA: Diagnosis not present

## 2016-04-05 DIAGNOSIS — F3130 Bipolar disorder, current episode depressed, mild or moderate severity, unspecified: Secondary | ICD-10-CM

## 2016-04-05 DIAGNOSIS — Z888 Allergy status to other drugs, medicaments and biological substances status: Secondary | ICD-10-CM

## 2016-04-05 DIAGNOSIS — Z818 Family history of other mental and behavioral disorders: Secondary | ICD-10-CM | POA: Diagnosis not present

## 2016-04-05 DIAGNOSIS — Z79899 Other long term (current) drug therapy: Secondary | ICD-10-CM

## 2016-04-05 MED ORDER — ARIPIPRAZOLE 10 MG PO TABS: 10.0000 mg | ORAL_TABLET | Freq: Every day | ORAL | 2 refills | Status: DC

## 2016-04-05 MED ORDER — LAMOTRIGINE 200 MG PO TABS: 200.0000 mg | ORAL_TABLET | Freq: Every day | ORAL | 2 refills | Status: DC

## 2016-04-05 MED ORDER — ZOLPIDEM TARTRATE 10 MG PO TABS: 10.0000 mg | ORAL_TABLET | Freq: Every day | ORAL | 2 refills | Status: DC

## 2016-04-05 NOTE — Progress Notes (Signed)
Patient ID: Lindsay Palmer, female   DOB: 09/23/1940, 76 y.o.   MRN: [id number] Patient ID: Lindsay Palmer, female   DOB: 06/21/1940, 76 y.o.   MRN: [id number] Patient ID: Lindsay Palmer, female   DOB: 1940-04-06, 76 y.o.   MRN: [id number]  Psychiatric  Adult follow-up  Patient Identification: Lindsay Palmer MRN:  [id number] Date of Evaluation:  04/05/2016 Referral Source: [facility] Chief Complaint:   Chief Complaint    Depression; Anxiety; Follow-up     Visit Diagnosis:    ICD-9-CM ICD-10-CM   1. Bipolar I disorder, most recent episode depressed (HCC) 296.50 F31.30    Diagnosis:   Patient Active Problem List   Diagnosis Date Noted  . Bipolar I disorder, most recent episode depressed (HCC) [F31.30] 07/29/2014  . Unspecified constipation [K59.00] 09/05/2013  . 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 Anemia [D64.9] 07/23/2013  . Drug-induced pancreatitis DUE TO CONTRAST [K85.30] 07/19/2013  . Benign essential HTN [I10] 07/19/2013  . Urinary retention [R33.9] 07/19/2013  . Acute pancreatitis [K85.90] 07/19/2013  . Abdominal pain [R10.9] 07/10/2013   History of Present Illness:  This patient is a 76 year old married white female who lives with her husband in [place]. She has a son, daughter and 3 grandchildren. She was working in [employer] prior to her retirement.  The patient was referred by [facility] where she had been receiving treatment. Her physician recently left the practice and they are no longer taking her insurance. She has a long-term history of bipolar disorder.  The patient presents with her husband today. They have been married since she was 76 years old. He states that for much of their marriage she had bouts of severe mood swings. At times she was pleasant and easy to be with the other time she was angry irritable and out of control. She also went through periods of euphoria and spending a lot of money. At one point they went to a family  counselor who suspected she had bipolar disorder. She was eventually referred here to this office and saw Dr. Lolly Mustache. He started her on Abilify and it really helped in terms of decreasing the anger and irritability. Eventually however she started seeing Dr. Tomasa Rand at [facility] because she was still having difficulties with being depressed. He added Lamictal and this seemed to stabilize her mood and she was no longer depressed or manic. The patient has been on this combination of medicines for several years and she seems very stable now. Her only difficulty is that she doesn't sleep well even with the addition of Ambien 10 mg at bedtime.  At times the patient sleeps up to 5 hours at night and sometimes only 2 hours. She's tried almost every medication there is for sleep. Her mood is generally good. However she is often tired in her energy is variable. She had a history of pancreatitis last year after having ERCP and the contrast dye caused the pancreatitis. She still has bouts of nausea and weakness. She's been followed by GI in her laboratories are normal. Her husband thinks she spends too much time in bed. She spends most of her time doing housework. She is stressed because her son and her husband are not getting along . her son is on disability due to a back injury but is trying to work doing Aeronautical engineer which worries her  The patient has never had a psychiatric hospitalization. She does not use drugs or alcohol and has never had any psychotic symptoms such as auditory  or visual hallucinations or paranoia  The patient and her husband return after 3 months. She states that she is doing better no longer hearing any noises at night. Her daughter went through her treatment from meningioma and is totally cancer free now and this is brought her a great deal of relief. Her mood is good but she still doesn't really like to get out and get much fresh air and exercise her husband and I both encouraged  her to do so. She denies any symptoms of depression or mania Elements:  Location:  Global. Quality:  Waxing and waning. Severity:  Low at this point, well controlled on medications. Timing:  Began when she was around 76 years old. Duration:  Years. Context:  Probably biological. Associated Signs/Symptoms: Depression Symptoms:  depressed mood, insomnia, psychomotor retardation, anxiety, disturbed sleep, (Hypo) Manic Symptoms:  Impulsivity, Irritable Mood,   Past Medical History:  Past Medical History:  Diagnosis Date  . Bipolar 2 disorder (HCC)   . Chronic insomnia   . Diverticulosis of colon   . Elevated LFTs   . Fatty liver disease, nonalcoholic   . GERD (gastroesophageal reflux disease)   . H/O first degree atrioventricular block   . Hypercholesteremia   . Hypertension   . Major depressive disorder   . Migraine   . Osteopenia   . Pancreatitis   . Sleep apnea    Stop Bang score of 4. Pt said she had sleep apnea at one time, but she has had another sleep study since then and they said she does not have it anymore.  . Vitamin D deficiency     Past Surgical History:  Procedure Laterality Date  . BILIARY STENT PLACEMENT N/A 07/18/2013   Procedure: BILIARY STENT PLACEMENT;  Surgeon: Malissa Hippo, MD;  Location: [facility];  Service: Endoscopy;  Laterality: N/A;  . Cataract Surgery Bilateral 08-2012  . CHOLECYSTECTOMY    . COLONOSCOPY  2003   Diverticulitis h/o polyps -due next 2015 -gastroscopy 2003  . ERCP    . ERCP N/A 07/18/2013   Procedure: ENDOSCOPIC RETROGRADE CHOLANGIOPANCREATOGRAPHY (ERCP) COMMON BILE DUCT BRUSHING;  Surgeon: Malissa Hippo, MD;  Location: [facility];  Service: Endoscopy;  Laterality: N/A;  . ERCP N/A 11/07/2013   Procedure: ENDOSCOPIC RETROGRADE CHOLANGIOPANCREATOGRAPHY (ERCP);  Surgeon: Malissa Hippo, MD;  Location: [facility];  Service: Endoscopy;  Laterality: N/A;  . EYE SURGERY Bilateral    cataract removal  . NECK SURGERY     Stenosis C6-C7  .  SIGMOIDOSCOPY  2007   no polyps  . SPHINCTEROTOMY N/A 07/18/2013   Procedure: SPHINCTEROTOMY;  Surgeon: Malissa Hippo, MD;  Location: [facility];  Service: Endoscopy;  Laterality: N/A;  . SPHINCTEROTOMY  11/07/2013   Procedure: EXTENSION OF SPHINCTEROTOMY;  Surgeon: Malissa Hippo, MD;  Location: [facility];  Service: Endoscopy;;  . STENT REMOVAL N/A 11/07/2013   Procedure: STENT REMOVAL;  Surgeon: Malissa Hippo, MD;  Location: [facility];  Service: Endoscopy;  Laterality: N/A;   Family History:  Family History  Problem Relation Age of Onset  . Diabetes Mother   . Coronary artery disease Father   . Gout Father   . Breast cancer Sister   . Pancreatic cancer Brother   . Alcohol abuse Brother   . Healthy Brother   . Lung cancer Sister   . Healthy Sister   . Depression Sister   . Hyperlipidemia Daughter   . Hyperlipidemia Son   . Bipolar disorder Son   .  Healthy Sister   . Healthy Sister   . Seizures Other    Social History:   Social History   Social History  . Marital status: Married    Spouse name: N/A  . Number of children: N/A  . Years of education: N/A   Social History Main Topics  . Smoking status: Never Smoker  . Smokeless tobacco: Never Used  . Alcohol use No  . Drug use: No  . Sexual activity: Not Asked   Other Topics Concern  . None   Social History Narrative  . None   Additional Social History: The patient grew up in [place] with both parents. Her parents argued a lot but there was no domestic violence. The patient completed high school but married very young. 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 She denies any history of abuse. She has few interests or hobbies and does not get any exercise  Musculoskeletal: Strength & Muscle Tone: within normal limits Gait & Station: normal Patient leans: N/A  Psychiatric Specialty Exam: Depression         Associated symptoms include insomnia.  Past  medical history includes anxiety.   Anxiety  Symptoms include insomnia.      Review of Systems  Gastrointestinal: Positive for abdominal pain.  Psychiatric/Behavioral: Positive for depression. The patient has insomnia.   All other systems reviewed and are negative.   Blood pressure (!) 166/50, pulse 66, height 5\' 4"  (1.626 m), weight 180 lb 3.2 oz (81.7 kg).Body mass index is 30.93 kg/m.  General Appearance: Casual, Neat and Well Groomed  Eye Contact:  Good  Speech:  Clear and Coherent  Volume:  Normal  Mood:  Euthymic   Affect:Fairly bright   Thought Process:  Goal Directed  Orientation:  Full (Time, Place, and Person)  Thought Content:  WDL  Suicidal Thoughts:  No  Homicidal Thoughts:  No  Memory:  Immediate;   Fair Recent;   Fair Remote;   Fair  Judgement:  Good  Insight:  Good  Psychomotor Activity:  Normal  Concentration:  Fair  Recall:  Fair  Fund of Knowledge:Good  Language: Good  Akathisia:  No  Handed:  Right  AIMS (if indicated):    Assets:  Communication Skills Desire for Improvement Leisure Time Resilience Social Support  ADL's:  Intact  Cognition: WNL  Sleep:  Irritable but not as good as it should be    Is the patient at risk to self?  No. Has the patient been a risk to self in the past 6 months?  No. Has the patient been a risk to self within the distant past?  No. Is the patient a risk to others?  No. Has the patient been a risk to others in the past 6 months?  No. Has the patient been a risk to others within the distant past?  No.  Allergies:   Allergies  Allergen Reactions  . Codeine Nausea And Vomiting  . Oxycodone    Current Medications: Current Outpatient Prescriptions  Medication Sig Dispense Refill  . amLODipine (NORVASC) 5 MG tablet Take by mouth. 1  1/2 tab daily.    . ARIPiprazole (ABILIFY) 10 MG tablet Take 1 tablet (10 mg total) by mouth at bedtime. 100 tablet 2  . calcium carbonate (TUMS - DOSED IN MG ELEMENTAL CALCIUM) 500  MG chewable tablet Chew 1 tablet by mouth 2 (two) times daily.    Marland Kitchen  Cholecalciferol (VITAMIN D) 400 UNITS capsule Take 400 Units by mouth daily.    Marland Kitchen. dicyclomine (BENTYL) 10 MG capsule TAKE ONE CAPSULE BY MOUTH TWICE A DAY WITH BREAKFAST AND LUNCH 60 capsule 1  . Fish Oil-Cholecalciferol (FISH OIL + D3 PO) Take 1,200 mg by mouth 2 (two) times daily.     . furosemide (LASIX) 40 MG tablet TAKE 1 TABLET BY MOUTH EVERY DAY AS NEEDED 30 tablet 1  . HYDROcodone-acetaminophen (LORTAB) 5-325 MG tablet Take 1 tablet by mouth every 6 (six) hours as needed for moderate pain. 60 tablet 0  . lamoTRIgine (LAMICTAL) 200 MG tablet Take 1 tablet (200 mg total) by mouth daily. 90 tablet 2  . losartan (COZAAR) 100 MG tablet Take 100 mg by mouth daily.    . meloxicam (MOBIC) 15 MG tablet Take 15 mg by mouth daily.    . metoprolol tartrate (LOPRESSOR) 25 MG tablet Take 12.5 mg by mouth 2 (two) times daily.     Marland Kitchen. omeprazole (PRILOSEC) 20 MG capsule Take 1 capsule (20 mg total) by mouth 2 (two) times daily before a meal. 180 capsule 3  . ondansetron (ZOFRAN) 4 MG tablet TAKE 1 TABLET BY MOUTH EVERY 8 HOURS AS NEEDED FOR NAUSEA OR VOMITING 30 tablet 0  . OVER THE COUNTER MEDICATION Fiber well - 5 grams 1 chewable tablet twice daily.    . polyethylene glycol powder (GLYCOLAX/MIRALAX) powder TAKE 8.5 GRAMS(1/2 CAPFUL) MIXED IN FLUID AND DRANK EVERY OTHER DAY 1581 g 3  . potassium chloride (K-DUR) 10 MEQ tablet TAKE 1 TABLET BY MOUTH EVERY DAY AS NEEDED 30 tablet 3  . simvastatin (ZOCOR) 20 MG tablet Take 20 mg by mouth every evening.     . vitamin B-12 (CYANOCOBALAMIN) 1000 MCG tablet Take 1,000 mcg by mouth daily.    Marland Kitchen. zolpidem (AMBIEN) 10 MG tablet Take 1 tablet (10 mg total) by mouth at bedtime. 90 tablet 2   No current facility-administered medications for this visit.     Previous Psychotropic Medications: Yes   Substance Abuse History in the last 12 months:  No.  Consequences of Substance Abuse: NA  Medical  Decision Making:  Established Problem, Stable/Improving (1), Review or order clinical lab tests (1), Decision to obtain old records (1), Review and summation of old records (2) and Review of Medication Regimen & Side Effects (2)  Treatment Plan Summary: Medication management  The patient seems to be stable on her current combination of Abilify and Lamictal for bipolar disorder. These medications will be continued. The Ambien 10 mg will be continued at bedtime for sleep She'll return to see me in 3 months but call if any of her symptoms worsen.    [place]folk[place], Smoke Ranch Surgery CenterDEBORAH 3/7/20182:35 PM Patient ID: Lindsay LinerHelen Palmer, female   DOB: [birth year], 76 y.o.   MRN: [id number]

## 2016-04-25 ENCOUNTER — Telehealth (INDEPENDENT_AMBULATORY_CARE_PROVIDER_SITE_OTHER): Payer: Self-pay | Admitting: Internal Medicine

## 2016-04-25 NOTE — Telephone Encounter (Signed)
Hope, Im not sure how to get lab to go to her house. May ask tammy. If she is that sick she may need to go to the ED

## 2016-04-25 NOTE — Telephone Encounter (Signed)
Patient's spouse, Fayrene FearingJames called and state that the patient has been sick with her stomach for about a week, pain, weakness and vomiting.  In the past Camelia Engerri has wanted to order labs to check for a specific enzyme.

## 2016-04-25 NOTE — Telephone Encounter (Signed)
I'm sorry Terri, I got locked out of my message.  The patient's husband stated she's too sick to go to the lab but he has found out they will come to the house and draw the lab, wants us to coordinate this.  He would like you to call him to discuss with him.  [phone]

## 2016-04-27 ENCOUNTER — Telehealth (INDEPENDENT_AMBULATORY_CARE_PROVIDER_SITE_OTHER): Payer: Self-pay | Admitting: Internal Medicine

## 2016-04-27 ENCOUNTER — Other Ambulatory Visit (INDEPENDENT_AMBULATORY_CARE_PROVIDER_SITE_OTHER): Payer: Self-pay | Admitting: *Deleted

## 2016-04-27 MED ORDER — POLYETHYLENE GLYCOL 3350 17 GM/SCOOP PO POWD: ORAL | 3 refills | Status: DC

## 2016-04-27 NOTE — Telephone Encounter (Signed)
Patient's spouse, Fayrene FearingJames called and stated that the patient needs a refill on her Polyethylene Glycol Powder 8.5 grams, the drug store will not refill or fax it to us because it's expired.  [phone]

## 2016-04-27 NOTE — Telephone Encounter (Signed)
Tammy, please see Terri's note. °

## 2016-04-27 NOTE — Telephone Encounter (Signed)
I talked with the patient . She states that she his feeling much better , and does not feel that the lab work in needed at this time. She provided a telephone number for us to have on hand if she is to sick to go to to the lab. It is [phone]. She thinks it is [facility]? Lab.

## 2016-05-01 MED ORDER — POLYETHYLENE GLYCOL 3350 17 GM/SCOOP PO POWD: ORAL | 5 refills | Status: DC

## 2016-05-01 NOTE — Telephone Encounter (Signed)
Rx for MIralax sent to her pharmacy 

## 2016-05-01 NOTE — Telephone Encounter (Signed)
Rx sent to her pharmacy 

## 2016-05-04 ENCOUNTER — Other Ambulatory Visit (INDEPENDENT_AMBULATORY_CARE_PROVIDER_SITE_OTHER): Payer: Self-pay | Admitting: *Deleted

## 2016-05-04 ENCOUNTER — Telehealth (INDEPENDENT_AMBULATORY_CARE_PROVIDER_SITE_OTHER): Payer: Self-pay | Admitting: Internal Medicine

## 2016-05-04 MED ORDER — POTASSIUM CHLORIDE ER 10 MEQ PO TBCR: 20.0000 meq | EXTENDED_RELEASE_TABLET | ORAL | 3 refills | Status: DC | PRN

## 2016-05-04 MED ORDER — TORSEMIDE 20 MG PO TABS: 20.0000 mg | ORAL_TABLET | Freq: Every day | ORAL | 0 refills | Status: DC

## 2016-05-04 NOTE — Telephone Encounter (Signed)
Patient called, stated that she's out of one of her medications.  The pharmacy told her that she would need to get this approved by her doctor, the medication is Furosemide  tablets.  She stated that this really hasn't been helping with the swelling in her feet and legs, she'd like Dr. Karilyn Cota to call something else in.  [facility] [address] in [place].  [phone]

## 2016-05-04 NOTE — Telephone Encounter (Signed)
Two new Rx was called to the patient's pharmacy. Demadex 20 mg every day prn for sweeling. KCL 20 mEq  - patient is to take on the days that she takes the Demadex. Pharmacy will contact the patient.

## 2016-06-01 ENCOUNTER — Telehealth (INDEPENDENT_AMBULATORY_CARE_PROVIDER_SITE_OTHER): Payer: Self-pay | Admitting: Internal Medicine

## 2016-06-01 DIAGNOSIS — K219 Gastro-esophageal reflux disease without esophagitis: Secondary | ICD-10-CM

## 2016-06-01 MED ORDER — OMEPRAZOLE 20 MG PO CPDR: 20.0000 mg | DELAYED_RELEASE_CAPSULE | Freq: Two times a day (BID) | ORAL | 3 refills | Status: DC

## 2016-06-01 NOTE — Telephone Encounter (Signed)
Omeprazole reordered

## 2016-06-03 ENCOUNTER — Other Ambulatory Visit (INDEPENDENT_AMBULATORY_CARE_PROVIDER_SITE_OTHER): Payer: Self-pay | Admitting: Internal Medicine

## 2016-06-05 ENCOUNTER — Other Ambulatory Visit (INDEPENDENT_AMBULATORY_CARE_PROVIDER_SITE_OTHER): Payer: Self-pay | Admitting: *Deleted

## 2016-06-05 ENCOUNTER — Other Ambulatory Visit (HOSPITAL_COMMUNITY): Payer: Self-pay | Admitting: Psychiatry

## 2016-06-05 MED ORDER — DICYCLOMINE HCL 10 MG PO CAPS: 10.0000 mg | ORAL_CAPSULE | Freq: Two times a day (BID) | ORAL | 0 refills | Status: DC

## 2016-06-06 ENCOUNTER — Other Ambulatory Visit (HOSPITAL_COMMUNITY): Payer: Self-pay | Admitting: Psychiatry

## 2016-07-05 ENCOUNTER — Ambulatory Visit (INDEPENDENT_AMBULATORY_CARE_PROVIDER_SITE_OTHER): Payer: Medicare Other | Admitting: Psychiatry

## 2016-07-05 ENCOUNTER — Encounter (HOSPITAL_COMMUNITY): Payer: Self-pay | Admitting: Psychiatry

## 2016-07-05 VITALS — BP 158/60 | Ht 64.0 in | Wt 180.0 lb

## 2016-07-05 DIAGNOSIS — Z818 Family history of other mental and behavioral disorders: Secondary | ICD-10-CM | POA: Diagnosis not present

## 2016-07-05 DIAGNOSIS — F3130 Bipolar disorder, current episode depressed, mild or moderate severity, unspecified: Secondary | ICD-10-CM

## 2016-07-05 DIAGNOSIS — Z811 Family history of alcohol abuse and dependence: Secondary | ICD-10-CM

## 2016-07-05 NOTE — Progress Notes (Signed)
Patient ID: Lindsay Palmer, female   DOB: 08-Aug-1940, 76 y.o.   MRN: [id number] Patient ID: Lindsay Palmer, female   DOB: 07-Apr-1940, 76 y.o.   MRN: [id number] Patient ID: Lindsay Palmer, female   DOB: [birth year], 76 y.o.   MRN: [id number]  Psychiatric  Adult follow-up  Patient Identification: Lindsay Palmer MRN:  [id number] Date of Evaluation:  07/05/2016 Referral Source: [facility] Chief Complaint:   Chief Complaint    Depression; Anxiety; Manic Behavior     Visit Diagnosis:    ICD-10-CM   1. Bipolar I disorder, most recent episode depressed (HCC) F31.30    Diagnosis:   Patient Active Problem List   Diagnosis Date Noted  . Bipolar I disorder, most recent episode depressed (HCC) [F31.30] 07/29/2014  . Unspecified constipation [K59.00] 09/05/2013  . 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 Anemia [D64.9] 07/23/2013  . Drug-induced pancreatitis DUE TO CONTRAST [K85.30] 07/19/2013  . Benign essential HTN [I10] 07/19/2013  . Urinary retention [R33.9] 07/19/2013  . Acute pancreatitis [K85.90] 07/19/2013  . Abdominal pain [R10.9] 07/10/2013   History of Present Illness:  This patient is a 76 year old married white female who lives with her husband in [place]. She has a son, daughter and 3 grandchildren. She was working in [employer] prior to her retirement.  The patient was referred by [facility] where she had been receiving treatment. Her physician recently left the practice and they are no longer taking her insurance. She has a long-term history of bipolar disorder.  The patient presents with her husband today. They have been married since she was 76 years old. He states that for much of their marriage she had bouts of severe mood swings. At times she was pleasant and easy to be with the other time she was angry irritable and out of control. She also went through periods of euphoria and spending a lot of money. At one point they went to a family counselor who  suspected she had bipolar disorder. She was eventually referred here to this office and saw Dr. Lolly Mustache. He started her on Abilify and it really helped in terms of decreasing the anger and irritability. Eventually however she started seeing Dr. Tomasa Rand at [facility] because she was still having difficulties with being depressed. He added Lamictal and this seemed to stabilize her mood and she was no longer depressed or manic. The patient has been on this combination of medicines for several years and she seems very stable now. Her only difficulty is that she doesn't sleep well even with the addition of Ambien 10 mg at bedtime.  At times the patient sleeps up to 5 hours at night and sometimes only 2 hours. She's tried almost every medication there is for sleep. Her mood is generally good. However she is often tired in her energy is variable. She had a history of pancreatitis last year after having ERCP and the contrast dye caused the pancreatitis. She still has bouts of nausea and weakness. She's been followed by GI in her laboratories are normal. Her husband thinks she spends too much time in bed. She spends most of her time doing housework. She is stressed because her son and her husband are not getting along . her son is on disability due to a back injury but is trying to work doing Aeronautical engineer which worries her  The patient has never had a psychiatric hospitalization. She does not use drugs or alcohol and has never had any psychotic symptoms such as auditory or  visual hallucinations or paranoia  The patient and her husband return after 3 months. She states that she is doing well mentally. Her feet are swollen and she also has frequent bouts of diarrhea but in terms of her mental status she feels good. She denies any symptoms of depression or anxiety. She is sleeping well most of the time with the Ambien. She denies any manic symptoms such as racing thoughts hallucinations or delusions. Her  husband complains that she doesn't get enough exercise and I again encouraged her to walk a little bit every day Elements:  Location:  Global. Quality:  Waxing and waning. Severity:  Low at this point, well controlled on medications. Timing:  Began when she was around 76 years old. Duration:  Years. Context:  Probably biological. Associated Signs/Symptoms: Depression Symptoms:  depressed mood, insomnia, psychomotor retardation, anxiety, disturbed sleep, (Hypo) Manic Symptoms:  Impulsivity, Irritable Mood,   Past Medical History:  Past Medical History:  Diagnosis Date  . Bipolar 2 disorder (HCC)   . Chronic insomnia   . Diverticulosis of colon   . Elevated LFTs   . Fatty liver disease, nonalcoholic   . GERD (gastroesophageal reflux disease)   . H/O first degree atrioventricular block   . Hypercholesteremia   . Hypertension   . Major depressive disorder   . Migraine   . Osteopenia   . Pancreatitis   . Sleep apnea    Stop Bang score of 4. Pt said she had sleep apnea at one time, but she has had another sleep study since then and they said she does not have it anymore.  . Vitamin D deficiency     Past Surgical History:  Procedure Laterality Date  . BILIARY STENT PLACEMENT N/A 07/18/2013   Procedure: BILIARY STENT PLACEMENT;  Surgeon: Malissa HippoNajeeb U Rehman, MD;  Location: [facility];  Service: Endoscopy;  Laterality: N/A;  . Cataract Surgery Bilateral 08-2012  . CHOLECYSTECTOMY    . COLONOSCOPY  2003   Diverticulitis h/o polyps -due next 2015 -gastroscopy 2003  . ERCP    . ERCP N/A 07/18/2013   Procedure: ENDOSCOPIC RETROGRADE CHOLANGIOPANCREATOGRAPHY (ERCP) COMMON BILE DUCT BRUSHING;  Surgeon: Malissa HippoNajeeb U Rehman, MD;  Location: [facility];  Service: Endoscopy;  Laterality: N/A;  . ERCP N/A 11/07/2013   Procedure: ENDOSCOPIC RETROGRADE CHOLANGIOPANCREATOGRAPHY (ERCP);  Surgeon: Malissa HippoNajeeb U Rehman, MD;  Location: [facility];  Service: Endoscopy;  Laterality: N/A;  . EYE SURGERY Bilateral     cataract removal  . NECK SURGERY     Stenosis C6-C7  . SIGMOIDOSCOPY  2007   no polyps  . SPHINCTEROTOMY N/A 07/18/2013   Procedure: SPHINCTEROTOMY;  Surgeon: Malissa HippoNajeeb U Rehman, MD;  Location: [facility];  Service: Endoscopy;  Laterality: N/A;  . SPHINCTEROTOMY  11/07/2013   Procedure: EXTENSION OF SPHINCTEROTOMY;  Surgeon: Malissa HippoNajeeb U Rehman, MD;  Location: [facility];  Service: Endoscopy;;  . STENT REMOVAL N/A 11/07/2013   Procedure: STENT REMOVAL;  Surgeon: Malissa HippoNajeeb U Rehman, MD;  Location: [facility];  Service: Endoscopy;  Laterality: N/A;   Family History:  Family History  Problem Relation Age of Onset  . Diabetes Mother   . Coronary artery disease Father   . Gout Father   . Breast cancer Sister   . Pancreatic cancer Brother   . Alcohol abuse Brother   . Healthy Brother   . Lung cancer Sister   . Healthy Sister   . Depression Sister   . Hyperlipidemia Daughter   . Hyperlipidemia Son   . Bipolar  disorder Son   . Healthy Sister   . Healthy Sister   . Seizures Other    Social History:   Social History   Social History  . Marital status: Married    Spouse name: N/A  . Number of children: N/A  . Years of education: N/A   Social History Main Topics  . Smoking status: Never Smoker  . Smokeless tobacco: Never Used  . Alcohol use No  . Drug use: No  . Sexual activity: Not Asked   Other Topics Concern  . None   Social History Narrative  . None   Additional Social History: The patient grew up in [place] with both parents. Her parents argued a lot but there was no domestic violence. The patient completed high school but married very young. 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 She denies any history of abuse. She has few interests or hobbies and does not get any exercise  Musculoskeletal: Strength & Muscle Tone: within normal limits Gait & Station: normal Patient leans: N/A  Psychiatric Specialty Exam: Depression          Associated symptoms include insomnia.  Past medical history includes anxiety.   Anxiety  Symptoms include insomnia.      Review of Systems  Gastrointestinal: Positive for abdominal pain.  Psychiatric/Behavioral: Positive for depression. The patient has insomnia.   All other systems reviewed and are negative.   Blood pressure (!) 158/60, height 5\' 4"  (1.626 m), weight 180 lb (81.6 kg).Body mass index is 30.9 kg/m.  General Appearance: Casual, Neat and Well Groomed  Eye Contact:  Good  Speech:  Clear and Coherent  Volume:  Normal  Mood:  Euthymic   Affect: bright   Thought Process:  Goal Directed  Orientation:  Full (Time, Place, and Person)  Thought Content:  WDL  Suicidal Thoughts:  No  Homicidal Thoughts:  No  Memory:  Immediate;   Fair Recent;   Fair Remote;   Fair  Judgement:  Good  Insight:  Good  Psychomotor Activity:  Normal  Concentration:  Fair  Recall:  Fair  Fund of Knowledge:Good  Language: Good  Akathisia:  No  Handed:  Right  AIMS (if indicated):    Assets:  Communication Skills Desire for Improvement Leisure Time Resilience Social Support  ADL's:  Intact  Cognition: WNL  Sleep:  Irritable but not as good as it should be    Is the patient at risk to self?  No. Has the patient been a risk to self in the past 6 months?  No. Has the patient been a risk to self within the distant past?  No. Is the patient a risk to others?  No. Has the patient been a risk to others in the past 6 months?  No. Has the patient been a risk to others within the distant past?  No.  Allergies:   Allergies  Allergen Reactions  . Codeine Nausea And Vomiting  . Oxycodone    Current Medications: Current Outpatient Prescriptions  Medication Sig Dispense Refill  . amLODipine (NORVASC) 5 MG tablet Take by mouth. 1  1/2 tab daily.    . ARIPiprazole (ABILIFY) 10 MG tablet Take 1 tablet (10 mg total) by mouth at bedtime. 100 tablet 2  . calcium carbonate (TUMS - DOSED  IN MG ELEMENTAL CALCIUM) 500 MG chewable tablet Chew 1 tablet by mouth 2 (two) times daily.    Marland Kitchen  Cholecalciferol (VITAMIN D) 400 UNITS capsule Take 400 Units by mouth daily.    Marland Kitchen dicyclomine (BENTYL) 10 MG capsule Take 1 capsule (10 mg total) by mouth 2 (two) times daily. before breakfast and lunch. 180 capsule 0  . Fish Oil-Cholecalciferol (FISH OIL + D3 PO) Take 1,200 mg by mouth 2 (two) times daily.     . furosemide (LASIX) 40 MG tablet TAKE 1 TABLET BY MOUTH EVERY DAY AS NEEDED 30 tablet 1  . HYDROcodone-acetaminophen (LORTAB) 5-325 MG tablet Take 1 tablet by mouth every 6 (six) hours as needed for moderate pain. 60 tablet 0  . lamoTRIgine (LAMICTAL) 200 MG tablet Take 1 tablet (200 mg total) by mouth daily. 90 tablet 2  . losartan (COZAAR) 100 MG tablet Take 100 mg by mouth daily.    . meloxicam (MOBIC) 15 MG tablet Take 15 mg by mouth daily.    . metoprolol tartrate (LOPRESSOR) 25 MG tablet Take 12.5 mg by mouth 2 (two) times daily.     Marland Kitchen omeprazole (PRILOSEC) 20 MG capsule Take 1 capsule (20 mg total) by mouth 2 (two) times daily before a meal. 180 capsule 3  . ondansetron (ZOFRAN) 4 MG tablet TAKE 1 TABLET BY MOUTH EVERY 8 HOURS AS NEEDED FOR NAUSEA OR VOMITING 30 tablet 0  . OVER THE COUNTER MEDICATION Fiber well - 5 grams 1 chewable tablet twice daily.    . polyethylene glycol powder (GLYCOLAX/MIRALAX) powder TAKE 8.5 GRAMS(1/2 CAPFUL) MIXED IN FLUID AND DRANK EVERY OTHER DAY 1581 g 5  . potassium chloride (K-DUR) 10 MEQ tablet Take 2 tablets (20 mEq total) by mouth as needed. Patient is to take this on the days she takes the Demadex. 30 tablet 3  . simvastatin (ZOCOR) 20 MG tablet Take 20 mg by mouth every evening.     . torsemide (DEMADEX) 20 MG tablet TAKE 1 TABLET (20 MG TOTAL) BY MOUTH DAILY. AS NEEDED FOR SWELLING. 30 tablet 0  . vitamin B-12 (CYANOCOBALAMIN) 1000 MCG tablet Take 1,000 mcg by mouth daily.    Marland Kitchen zolpidem (AMBIEN) 10 MG tablet Take 1 tablet (10 mg total) by mouth at  bedtime. 90 tablet 2   No current facility-administered medications for this visit.     Previous Psychotropic Medications: Yes   Substance Abuse History in the last 12 months:  No.  Consequences of Substance Abuse: NA  Medical Decision Making:  Established Problem, Stable/Improving (1), Review or order clinical lab tests (1), Decision to obtain old records (1), Review and summation of old records (2) and Review of Medication Regimen & Side Effects (2)  Treatment Plan Summary: Medication management  The patient seems to be stable on her current combination of Abilify and Lamictal for bipolar disorder. These medications will be continued. The Ambien 10 mg will be continued at bedtime for sleep She'll return to see me in 3 months but call if any of her symptoms worsen.    Garner, [facility] 6/6/20182:10 PM Patient ID: Tajha Sammarco, female   DOB: March 31, 1940, 76 y.o.   MRN: [id number]

## 2016-07-27 ENCOUNTER — Other Ambulatory Visit (INDEPENDENT_AMBULATORY_CARE_PROVIDER_SITE_OTHER): Payer: Self-pay | Admitting: Internal Medicine

## 2016-08-08 ENCOUNTER — Other Ambulatory Visit (HOSPITAL_COMMUNITY)
Admission: RE | Admit: 2016-08-08 | Discharge: 2016-08-08 | Disposition: A | Discharge status: Home / Self Care | Payer: Medicare Other | Source: Ambulatory Visit | Attending: Internal Medicine | Admitting: Internal Medicine

## 2016-08-08 ENCOUNTER — Encounter (INDEPENDENT_AMBULATORY_CARE_PROVIDER_SITE_OTHER): Payer: Self-pay | Admitting: Internal Medicine

## 2016-08-08 ENCOUNTER — Ambulatory Visit (INDEPENDENT_AMBULATORY_CARE_PROVIDER_SITE_OTHER): Payer: Medicare Other | Admitting: Internal Medicine

## 2016-08-08 VITALS — BP 120/70 | HR 67 | Temp 98.0°F | Resp 18 | Ht 64.0 in | Wt 182.4 lb

## 2016-08-08 DIAGNOSIS — K580 Irritable bowel syndrome with diarrhea: Secondary | ICD-10-CM | POA: Diagnosis not present

## 2016-08-08 DIAGNOSIS — R1030 Lower abdominal pain, unspecified: Secondary | ICD-10-CM | POA: Diagnosis not present

## 2016-08-08 DIAGNOSIS — R110 Nausea: Secondary | ICD-10-CM | POA: Insufficient documentation

## 2016-08-08 DIAGNOSIS — R1310 Dysphagia, unspecified: Secondary | ICD-10-CM | POA: Diagnosis not present

## 2016-08-08 DIAGNOSIS — R1319 Other dysphagia: Secondary | ICD-10-CM

## 2016-08-08 LAB — CBC WITH DIFFERENTIAL/PLATELET
BASOS PCT: 0 %
Basophils Absolute: 0 10*3/uL (ref 0.0–0.1)
EOS ABS: 0.1 10*3/uL (ref 0.0–0.7)
Eosinophils Relative: 1 %
HCT: 36.5 % (ref 36.0–46.0)
Hemoglobin: 12.1 g/dL (ref 12.0–15.0)
Lymphocytes Relative: 22 %
Lymphs Abs: 2.1 10*3/uL (ref 0.7–4.0)
MCH: 30 pg (ref 26.0–34.0)
MCHC: 33.2 g/dL (ref 30.0–36.0)
MCV: 90.6 fL (ref 78.0–100.0)
MONO ABS: 0.8 10*3/uL (ref 0.1–1.0)
Monocytes Relative: 9 %
Neutro Abs: 6.6 10*3/uL (ref 1.7–7.7)
Neutrophils Relative %: 68 %
Platelets: 238 10*3/uL (ref 150–400)
RBC: 4.03 MIL/uL (ref 3.87–5.11)
RDW: 14.7 % (ref 11.5–15.5)
WBC: 9.7 10*3/uL (ref 4.0–10.5)

## 2016-08-08 LAB — LIPASE, BLOOD: LIPASE: 22 U/L (ref 11–51)

## 2016-08-08 MED ORDER — HYDROCODONE-ACETAMINOPHEN 5-325 MG PO TABS: 1.0000 | ORAL_TABLET | Freq: Two times a day (BID) | ORAL | 0 refills | Status: DC | PRN

## 2016-08-08 MED ORDER — HYDROCODONE-ACETAMINOPHEN 5-325 MG PO TABS
1.0000 | ORAL_TABLET | Freq: Four times a day (QID) | ORAL | 0 refills | Status: DC | PRN
Start: 2016-08-08 — End: 2016-08-08

## 2016-08-08 NOTE — Patient Instructions (Signed)
Barium pill esophagogram to be scheduled. Physician will call with results of blood tests when completed. Take dicyclomine 30 minutes before lunch and evening meal. Fiber supplement 4-6 g per day.

## 2016-08-08 NOTE — Progress Notes (Signed)
Presenting complaint;  Nausea abdominal pain and dysphagia.  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 She also had post ERCP pancreatitis with full recovery. She was last seen in January for chronic nausea. Her LFTs are unremarkable. Now she returns with multiple complaints. She is accompanied by her husband Mellody DanceKeith. She is having daily nausea with poor intake. She also has chills and low-grade fever. She also has intermittent spells of lower abdominal pain. She had an episode 2 weeks ago and another one yesterday. She is having postprandial diarrhea. She denies melena or rectal bleeding. Her husband states she does not do anything other than staying in bed and/or chair. He has wanted her to come to emergency room for evaluation but she has declined. She generally feels better within a matter of a few hours. She is using pain pill on as-needed basis but not daily for abdominal pain. She needs a refill. Patient does not feel depressed. She has gained 5 pounds since her last visit 6 months ago. She feels weight gain may be due to lower extremity edema. She is on Lasix which she does not take daily. She has taken MiraLAX in the past for constipation but not in the last 1 months since she's had postprandial diarrhea. She denies recent antibiotic use. She is not having nocturnal bowel movement or accidents.    Current Medications: Outpatient Encounter Prescriptions as of 08/08/2016  Medication Sig  . amLODipine (NORVASC) 5 MG tablet Take by mouth. 1  1/2 tab daily.  . ARIPiprazole (ABILIFY) 10 MG tablet Take 1 tablet (10 mg total) by mouth at bedtime.  . calcium carbonate (TUMS - DOSED IN MG ELEMENTAL CALCIUM) 500 MG chewable tablet Chew 1 tablet by mouth 2 (two) times daily.  Marland Kitchen. dicyclomine (BENTYL) 10 MG capsule Take 1 capsule (10 mg total) by mouth 2 (two) times  daily. before breakfast and lunch.  Marland Kitchen. Fish Oil-Cholecalciferol (FISH OIL + D3 PO) Take 1,200 mg by mouth 2 (two) times daily.   . furosemide (LASIX) 40 MG tablet TAKE 1 TABLET BY MOUTH EVERY DAY AS NEEDED  . HYDROcodone-acetaminophen (LORTAB) 5-325 MG tablet Take 1 tablet by mouth every 6 (six) hours as needed for moderate pain.  Marland Kitchen. lamoTRIgine (LAMICTAL) 200 MG tablet Take 1 tablet (200 mg total) by mouth daily.  Marland Kitchen. losartan (COZAAR) 100 MG tablet Take 100 mg by mouth daily.  . meloxicam (MOBIC) 15 MG tablet Take 15 mg by mouth daily.  . metoprolol tartrate (LOPRESSOR) 25 MG tablet Take 12.5 mg by mouth 2 (two) times daily.   Marland Kitchen. omeprazole (PRILOSEC) 20 MG capsule Take 1 capsule (20 mg total) by mouth 2 (two) times daily before a meal.  . ondansetron (ZOFRAN) 4 MG tablet TAKE 1 TABLET BY MOUTH EVERY 8 HOURS AS NEEDED FOR NAUSEA OR VOMITING  . OVER THE COUNTER MEDICATION Fiber well - 5 grams 1 chewable tablet twice daily.  . polyethylene glycol powder (GLYCOLAX/MIRALAX) powder TAKE 8.5 GRAMS(1/2 CAPFUL) MIXED IN FLUID AND DRANK EVERY OTHER DAY  . potassium chloride (K-DUR) 10 MEQ tablet Take 2 tablets (20 mEq total) by mouth as needed. Patient is to take this on the days she takes the Demadex.  . simvastatin (ZOCOR) 20 MG tablet Take 20 mg by mouth every evening.   . torsemide (DEMADEX) 20 MG tablet TAKE 1 TABLET (20 MG TOTAL) BY MOUTH DAILY. AS NEEDED FOR  SWELLING.  . vitamin B-12 (CYANOCOBALAMIN) 1000 MCG tablet Take 1,000 mcg by mouth daily.  Marland Kitchen zolpidem (AMBIEN) 10 MG tablet Take 1 tablet (10 mg total) by mouth at bedtime.  . Cholecalciferol (VITAMIN D) 400 UNITS capsule Take 400 Units by mouth daily.   No facility-administered encounter medications on file as of 08/08/2016.      Objective: Blood pressure 120/70, pulse 67, temperature 98 F (36.7 C), temperature source Oral, resp. rate 18, height 5\' 4"  (1.626 m), weight 182 lb 6.4 oz (82.7 kg). Patient is alert and in no acute distress. She  is well dressed as always. Conjunctiva is pink. Sclera is nonicteric Oropharyngeal mucosa is normal. No neck masses or thyromegaly noted. Cardiac exam with regular rhythm normal S1 and S2. No murmur or gallop noted. Lungs are clear to auscultation. Abdomen is full. Bowel sounds are normal. On palpation abdomen is soft with mild tenderness at LLQ. No organomegaly or masses. No LE edema or clubbing noted.  Labs/studies Results: LFTs from 02/02/2016  Bilirubin 0.3, AP 108, AST 17, ALT 18 and albumin 3.9.    Assessment:  #1. Nausea without vomiting. It remains to be seen of this symptom is due to her medications are GI disorder. She has history of CBD stricture treated with biliary stenting few years ago as well as history of post ERCP pancreatitis which resolved completely on follow-up imaging studies. I am concerned her nausea is central in origin and may be related to her medication and or depression(she does not feel that she is depressed).  #2. Lower abdominal pain. She is having intermittent spells. Clinical course not consistent with diverticulitis. If she does not respond to anti-spasmodic will consider abdominopelvic CT.  #3. Esophageal dysphagia.  #4. Diarrhea felt to be due to IBS.  Plan:  Patient will go to the lab for CBC LFTs and serum lipase. Barium pill esophagogram. Take dicyclomine 30 minutes before lunch and 30 m before evening meal. Fiber supplement dose not to exceed 6 g per day. Prescription given for hydrocodone/acetaminophen 5/325 by mouth twice a day when necessary 60 doses without a refill. Patient encouraged to increase physical activity. She will call office if she has abdominal pain associated with fever of greater than 101. Office visit in 3 months.

## 2016-08-09 LAB — HEPATIC FUNCTION PANEL
ALK PHOS: 139 U/L — AB (ref 38–126)
ALT: 27 U/L (ref 14–54)
AST: 24 U/L (ref 15–41)
Albumin: 4 g/dL (ref 3.5–5.0)
BILIRUBIN TOTAL: 0.8 mg/dL (ref 0.3–1.2)
Total Protein: 7.9 g/dL (ref 6.5–8.1)

## 2016-08-10 ENCOUNTER — Encounter (INDEPENDENT_AMBULATORY_CARE_PROVIDER_SITE_OTHER): Payer: Self-pay | Admitting: *Deleted

## 2016-08-10 ENCOUNTER — Other Ambulatory Visit (INDEPENDENT_AMBULATORY_CARE_PROVIDER_SITE_OTHER): Payer: Self-pay | Admitting: *Deleted

## 2016-08-10 DIAGNOSIS — R1319 Other dysphagia: Secondary | ICD-10-CM | POA: Insufficient documentation

## 2016-08-14 ENCOUNTER — Ambulatory Visit (HOSPITAL_COMMUNITY): Payer: Medicare Other

## 2016-08-23 ENCOUNTER — Encounter (HOSPITAL_COMMUNITY)
Admission: RE | Disposition: A | Discharge status: Home / Self Care | Payer: Self-pay | Source: Ambulatory Visit | Attending: Internal Medicine

## 2016-08-23 ENCOUNTER — Ambulatory Visit (HOSPITAL_COMMUNITY)
Admission: RE | Admit: 2016-08-23 | Discharge: 2016-08-23 | Disposition: A | Discharge status: Home / Self Care | Payer: Medicare Other | Source: Ambulatory Visit | Attending: Internal Medicine | Admitting: Internal Medicine

## 2016-08-23 ENCOUNTER — Encounter (HOSPITAL_COMMUNITY): Payer: Self-pay | Admitting: *Deleted

## 2016-08-23 DIAGNOSIS — Q394 Esophageal web: Secondary | ICD-10-CM | POA: Insufficient documentation

## 2016-08-23 DIAGNOSIS — E7800 Pure hypercholesterolemia, unspecified: Secondary | ICD-10-CM | POA: Insufficient documentation

## 2016-08-23 DIAGNOSIS — R1010 Upper abdominal pain, unspecified: Secondary | ICD-10-CM | POA: Diagnosis not present

## 2016-08-23 DIAGNOSIS — R1314 Dysphagia, pharyngoesophageal phase: Secondary | ICD-10-CM | POA: Diagnosis not present

## 2016-08-23 DIAGNOSIS — F329 Major depressive disorder, single episode, unspecified: Secondary | ICD-10-CM | POA: Diagnosis not present

## 2016-08-23 DIAGNOSIS — K859 Acute pancreatitis without necrosis or infection, unspecified: Secondary | ICD-10-CM | POA: Diagnosis not present

## 2016-08-23 DIAGNOSIS — I10 Essential (primary) hypertension: Secondary | ICD-10-CM | POA: Insufficient documentation

## 2016-08-23 DIAGNOSIS — R112 Nausea with vomiting, unspecified: Secondary | ICD-10-CM | POA: Diagnosis not present

## 2016-08-23 DIAGNOSIS — G4730 Sleep apnea, unspecified: Secondary | ICD-10-CM | POA: Insufficient documentation

## 2016-08-23 DIAGNOSIS — E559 Vitamin D deficiency, unspecified: Secondary | ICD-10-CM | POA: Insufficient documentation

## 2016-08-23 DIAGNOSIS — K2970 Gastritis, unspecified, without bleeding: Secondary | ICD-10-CM | POA: Diagnosis not present

## 2016-08-23 DIAGNOSIS — K219 Gastro-esophageal reflux disease without esophagitis: Secondary | ICD-10-CM | POA: Insufficient documentation

## 2016-08-23 DIAGNOSIS — Z79899 Other long term (current) drug therapy: Secondary | ICD-10-CM | POA: Diagnosis not present

## 2016-08-23 DIAGNOSIS — K760 Fatty (change of) liver, not elsewhere classified: Secondary | ICD-10-CM | POA: Diagnosis not present

## 2016-08-23 DIAGNOSIS — R1319 Other dysphagia: Secondary | ICD-10-CM

## 2016-08-23 HISTORY — PX: ESOPHAGEAL DILATION: SHX303

## 2016-08-23 HISTORY — PX: ESOPHAGOGASTRODUODENOSCOPY: SHX5428

## 2016-08-23 SURGERY — EGD (ESOPHAGOGASTRODUODENOSCOPY)
Anesthesia: Moderate Sedation

## 2016-08-23 MED ORDER — MIDAZOLAM HCL 5 MG/5ML IJ SOLN
INTRAMUSCULAR | Status: DC | PRN
  Administered 2016-08-23: 2 mg via INTRAVENOUS
  Administered 2016-08-23: 1 mg via INTRAVENOUS
  Administered 2016-08-23 (×2): 2 mg via INTRAVENOUS

## 2016-08-23 MED ORDER — SODIUM CHLORIDE 0.9 % IV SOLN
INTRAVENOUS | Status: DC
  Administered 2016-08-23: 1000 mL via INTRAVENOUS

## 2016-08-23 MED ORDER — MEPERIDINE HCL 50 MG/ML IJ SOLN
INTRAMUSCULAR | Status: AC
  Filled 2016-08-23: qty 1

## 2016-08-23 MED ORDER — LIDOCAINE VISCOUS 2 % MT SOLN
OROMUCOSAL | Status: AC
  Filled 2016-08-23: qty 15

## 2016-08-23 MED ORDER — LIDOCAINE VISCOUS 2 % MT SOLN: 15.0000 mL | Freq: Once | OROMUCOSAL | Status: DC

## 2016-08-23 MED ORDER — MIDAZOLAM HCL 5 MG/5ML IJ SOLN
INTRAMUSCULAR | Status: AC
  Filled 2016-08-23: qty 10

## 2016-08-23 MED ORDER — BUTAMBEN-TETRACAINE-BENZOCAINE 2-2-14 % EX AERO: INHALATION_SPRAY | CUTANEOUS | Status: DC | PRN

## 2016-08-23 MED ORDER — MEPERIDINE HCL 50 MG/ML IJ SOLN
INTRAMUSCULAR | Status: DC | PRN
  Administered 2016-08-23 (×2): 25 mg via INTRAVENOUS

## 2016-08-23 NOTE — Op Note (Signed)
[facility] Patient Name: Lindsay LinerHelen Palmer Procedure Date: 08/23/2016 1:01 PM MRN: [id number] Date of Birth: [birth year] Attending MD: Lionel DecemberNajeeb Rehman , MD CSN: [id number] Age: [age] Admit Type: Outpatient Procedure:                Upper GI endoscopy Indications:              Upper abdominal pain, Esophageal dysphagia, Nausea                            with vomiting Providers:                Lionel DecemberNajeeb Rehman, MD, Armando Gangracy Greene, RN, Dyann Ruddleonya Wilson Referring MD:             Angelique Holmianne E. Elliot, NP Medicines:                Lidocaine spray, Meperidine 50 mg IV, Midazolam 7                            mg IV Complications:            No immediate complications. Estimated Blood Loss:     Estimated blood loss: none. Procedure:                Pre-Anesthesia Assessment:                           - Prior to the procedure, a History and Physical                            was performed, and patient medications and                            allergies were reviewed. The patient's tolerance of                            previous anesthesia was also reviewed. The risks                            and benefits of the procedure and the sedation                            options and risks were discussed with the patient.                            All questions were answered, and informed consent                            was obtained. Prior Anticoagulants: The patient                            last took previous NSAID medication 1 day prior to                            the procedure. ASA Grade Assessment: II - A patient  with mild systemic disease. After reviewing the                            risks and benefits, the patient was deemed in                            satisfactory condition to undergo the procedure.                           After obtaining informed consent, the endoscope was                            passed under direct vision. Throughout the   procedure, the patient's blood pressure, pulse, and                            oxygen saturations were monitored continuously. The                            EG-[id number] ([id number]) scope was introduced through the                            mouth, and advanced to the second part of duodenum.                            The upper GI endoscopy was accomplished without                            difficulty. The patient tolerated the procedure                            well. Scope In: 1:58:03 PM Scope Out: 2:08:37 PM Total Procedure Duration: 0 hours 10 minutes 34 seconds  Findings:      A web was found in the proximal esophagus. The scope was withdrawn.       Dilation was performed with a Maloney dilator with no resistance at 54       Fr. The dilation site was examined following endoscope reinsertion and       showed small linear mucosal disruption. Estimated blood loss: none.      The Z-line was regular and was found 39 cm from the incisors.      Patchy mild inflammation characterized by erythema was found in the       gastric antrum. Biopsies were taken with a cold forceps for histology.      The exam of the stomach was otherwise normal.      The duodenal bulb and second portion of the duodenum were normal. Impression:               - Web in the proximal esophagus. Dilated.                           - Z-line regular, 39 cm from the incisors.                           - Gastritis. Biopsied.                           -  Normal duodenal bulb and second portion of the                            duodenum. Moderate Sedation:      Moderate (conscious) sedation was administered by the endoscopy nurse       and supervised by the endoscopist. The following parameters were       monitored: oxygen saturation, heart rate, blood pressure, CO2       capnography and response to care. Total physician intraservice time was       15 minutes. Recommendation:           - Patient has a contact number available  for                            emergencies. The signs and symptoms of potential                            delayed complications were discussed with the                            patient. Return to normal activities tomorrow.                            Written discharge instructions were provided to the                            patient.                           - Resume previous diet today.                           - Continue present medications.                           - Await pathology results. Procedure Code(s):        --- Professional ---                           [phone], Esophagogastroduodenoscopy, flexible,                            transoral; with biopsy, single or multiple                           43450, Dilation of esophagus, by unguided sound or                            bougie, single or multiple passes                           99152, Moderate sedation services provided by the                            same physician or other qualified health care  professional performing the diagnostic or                            therapeutic service that the sedation supports,                            requiring the presence of an independent trained                            observer to assist in the monitoring of the                            patient's level of consciousness and physiological                            status; initial 15 minutes of intraservice time,                            patient age 44 years or older Diagnosis Code(s):        --- Professional ---                           Q39.4, Esophageal web                           K29.70, Gastritis, unspecified, without bleeding                           R10.10, Upper abdominal pain, unspecified                           R13.14, Dysphagia, pharyngoesophageal phase                           R11.2, Nausea with vomiting, unspecified CPT copyright 2016 American Medical Association. All rights  reserved. The codes documented in this report are preliminary and upon coder review may  be revised to meet current compliance requirements. Lionel DecemberNajeeb Rehman, MD Lionel DecemberNajeeb Rehman, MD 08/23/2016 2:19:57 PM This report has been signed electronically. Number of Addenda: 0

## 2016-08-23 NOTE — Discharge Instructions (Signed)
Resume usual medications and diet. No driving for 24 hours. Physician will call with biopsy results.  Esophagogastroduodenoscopy, Care After Refer to this sheet in the next few weeks. These instructions provide you with information about caring for yourself after your procedure. Your health care provider may also give you more specific instructions. Your treatment has been planned according to current medical practices, but problems sometimes occur. Call your health care provider if you have any problems or questions after your procedure. What can I expect after the procedure? After the procedure, it is common to have:  A sore throat.  Nausea.  Bloating.  Dizziness.  Fatigue.  Follow these instructions at home:  Do not eat or drink anything until the numbing medicine (local anesthetic) has worn off and your gag reflex has returned. You will know that the local anesthetic has worn off when you can swallow comfortably.  Do not drive for 24 hours if you received a medicine to help you relax (sedative).  If your health care provider took a tissue sample for testing during the procedure, make sure to get your test results. This is your responsibility. Ask your health care provider or the department performing the test when your results will be ready.  Keep all follow-up visits as told by your health care provider. This is important. Contact a health care provider if:  You cannot stop coughing.  You are not urinating.  You are urinating less than usual. Get help right away if:  You have trouble swallowing.  You cannot eat or drink.  You have throat or chest pain that gets worse.  You are dizzy or light-headed.  You faint.  You have nausea or vomiting.  You have chills.  You have a fever.  You have severe abdominal pain.  You have black, tarry, or bloody stools. This information is not intended to replace advice given to you by your health care provider. Make sure you  discuss any questions you have with your health care provider. Document Released: 01/03/2012 Document Revised: 06/24/2015 Document Reviewed: 12/10/2014 Elsevier Interactive Patient Education  [employer]2018 Elsevier Inc.

## 2016-08-23 NOTE — H&P (Signed)
Lindsay LinerHelen Palmer is an 76 y.o. female.   Chief Complaint: Patient is here for EGD and possible ED. HPI: Patient is 76 year old Caucasian female with multiple medical problems who presents with few week history of dysphagia to solids. She points to suprasternal area site of bolus obstruction with food eventually goes down.  She also has intermittent episodes of nausea vomiting and upper abdominal pain. She has not responded therapy. She has history of post ERCP pancreatitis for which she is fully recovered. Recent LFTs were normal. She denies melena.  Past Medical History:  Diagnosis Date  . Bipolar 2 disorder (HCC)   . Chronic insomnia   . Diverticulosis of colon    History of CBD stricture treated with biliary stenting.    . Fatty liver disease, nonalcoholic   . GERD (gastroesophageal reflux disease)   . H/O first degree atrioventricular block   . Hypercholesteremia   . Hypertension   . Major depressive disorder   . Migraine   . Osteopenia    History of post ERCP pancreatitis.    . Sleep apnea    Stop Bang score of 4. Pt said she had sleep apnea at one time, but she has had another sleep study since then and they said she does not have it anymore.  . Vitamin D deficiency     Past Surgical History:  Procedure Laterality Date  . BILIARY STENT PLACEMENT N/A 07/18/2013   Procedure: BILIARY STENT PLACEMENT;  Surgeon: Malissa HippoNajeeb U Duvid Smalls, MD;  Location: [facility];  Service: Endoscopy;  Laterality: N/A;  . Cataract Surgery Bilateral 08-2012  . CHOLECYSTECTOMY    . COLONOSCOPY  2003   Diverticulitis h/o polyps -due next 2015 -gastroscopy 2003  . ERCP    . ERCP N/A 07/18/2013   Procedure: ENDOSCOPIC RETROGRADE CHOLANGIOPANCREATOGRAPHY (ERCP) COMMON BILE DUCT BRUSHING;  Surgeon: Malissa HippoNajeeb U Kassey Laforest, MD;  Location: [facility];  Service: Endoscopy;  Laterality: N/A;  . ERCP N/A 11/07/2013   Procedure: ENDOSCOPIC RETROGRADE CHOLANGIOPANCREATOGRAPHY (ERCP);  Surgeon: Malissa HippoNajeeb U Ottilia Pippenger, MD;  Location: [facility];   Service: Endoscopy;  Laterality: N/A;  . EYE SURGERY Bilateral    cataract removal  . NECK SURGERY     Stenosis C6-C7  . SIGMOIDOSCOPY  2007   no polyps  . SPHINCTEROTOMY N/A 07/18/2013   Procedure: SPHINCTEROTOMY;  Surgeon: Malissa HippoNajeeb U Filbert Craze, MD;  Location: [facility];  Service: Endoscopy;  Laterality: N/A;  . SPHINCTEROTOMY  11/07/2013   Procedure: EXTENSION OF SPHINCTEROTOMY;  Surgeon: Malissa HippoNajeeb U Augustino Savastano, MD;  Location: [facility];  Service: Endoscopy;;  . STENT REMOVAL N/A 11/07/2013   Procedure: STENT REMOVAL;  Surgeon: Malissa HippoNajeeb U Traveion Ruddock, MD;  Location: [facility];  Service: Endoscopy;  Laterality: N/A;    Family History  Problem Relation Age of Onset  . Diabetes Mother   . Coronary artery disease Father   . Gout Father   . Breast cancer Sister   . Pancreatic cancer Brother   . Alcohol abuse Brother   . Healthy Brother   . Lung cancer Sister   . Healthy Sister   . Depression Sister   . Hyperlipidemia Daughter   . Hyperlipidemia Son   . Bipolar disorder Son   . Healthy Sister   . Healthy Sister   . Seizures Other    Social History:  reports that she has never smoked. She has never used smokeless tobacco. She reports that she does not drink alcohol or use drugs.  Allergies:  Allergies  Allergen Reactions  . Codeine Nausea And  Vomiting  . Oxycodone Nausea And Vomiting    Medications Prior to Admission  Medication Sig Dispense Refill  . acetaminophen (TYLENOL) 500 MG tablet Take 500 mg by mouth daily as needed for moderate pain or headache.    Marland Kitchen amLODipine (NORVASC) 10 MG tablet Take 15 mg by mouth daily.    . ARIPiprazole (ABILIFY) 10 MG tablet Take 1 tablet (10 mg total) by mouth at bedtime. 100 tablet 2  . Cholecalciferol (VITAMIN D) 400 UNITS capsule Take 400 Units by mouth daily.    Marland Kitchen dicyclomine (BENTYL) 10 MG capsule Take 1 capsule (10 mg total) by mouth 2 (two) times daily. before breakfast and lunch. (Patient taking differently: Take 10 mg by mouth 2 (two) times daily. At lunch  and dinner) 180 capsule 0  . Fish Oil-Cholecalciferol (FISH OIL + D3 PO) Take 1 capsule by mouth 2 (two) times daily.     Marland Kitchen HYDROcodone-acetaminophen (NORCO/VICODIN) 5-325 MG tablet Take 1 tablet by mouth 2 (two) times daily as needed for moderate pain. 60 tablet 0  . lamoTRIgine (LAMICTAL) 200 MG tablet Take 1 tablet (200 mg total) by mouth daily. 90 tablet 2  . losartan (COZAAR) 100 MG tablet Take 100 mg by mouth daily.    . meloxicam (MOBIC) 15 MG tablet Take 15 mg by mouth daily with supper.     . metoprolol tartrate (LOPRESSOR) 25 MG tablet Take 12.5 mg by mouth 2 (two) times daily.     Marland Kitchen omeprazole (PRILOSEC) 20 MG capsule Take 1 capsule (20 mg total) by mouth 2 (two) times daily before a meal. 180 capsule 3  . ondansetron (ZOFRAN) 4 MG tablet TAKE 1 TABLET BY MOUTH EVERY 8 HOURS AS NEEDED FOR NAUSEA OR VOMITING 30 tablet 0  . potassium chloride (K-DUR) 10 MEQ tablet Take 2 tablets (20 mEq total) by mouth as needed. Patient is to take this on the days she takes the Demadex. 30 tablet 3  . simvastatin (ZOCOR) 20 MG tablet Take 20 mg by mouth every evening.     . furosemide (LASIX) 40 MG tablet TAKE 1 TABLET BY MOUTH EVERY DAY AS NEEDED (Patient not taking: Reported on 08/22/2016) 30 tablet 1  . OVER THE COUNTER MEDICATION Fiber well - 5 grams 1 chewable tablet twice daily.    . polyethylene glycol powder (GLYCOLAX/MIRALAX) powder TAKE 8.5 GRAMS(1/2 CAPFUL) MIXED IN FLUID AND DRANK EVERY OTHER DAY (Patient taking differently: TAKE 17 G MIXED IN FLUID AND DRINK AS NEEDED FOR CONSTIPATION) 1581 g 5  . torsemide (DEMADEX) 20 MG tablet TAKE 1 TABLET (20 MG TOTAL) BY MOUTH DAILY. AS NEEDED FOR SWELLING. (Patient taking differently: Take 20 mg by mouth daily as needed (swelling). ) 30 tablet 0  . vitamin B-12 (CYANOCOBALAMIN) 1000 MCG tablet Take 1,000 mcg by mouth daily.    Marland Kitchen zolpidem (AMBIEN) 10 MG tablet Take 1 tablet (10 mg total) by mouth at bedtime. 90 tablet 2    No results found for this or  any previous visit (from the past 48 hour(s)). No results found.  ROS  Blood pressure (!) 158/48, pulse 75, temperature (!) 97.1 F (36.2 C), temperature source Oral, resp. rate 18, height 5\' 4"  (1.626 m), weight 182 lb (82.6 kg), SpO2 99 %. Physical Exam  Constitutional: She appears well-developed and well-nourished.  HENT:  Mouth/Throat: Oropharynx is clear and moist.  Eyes: Conjunctivae are normal. No scleral icterus.  Neck: No thyromegaly present.  Cardiovascular: Normal rate and regular rhythm.   Murmur (faint  systolic ejection murmur best heard at left sternal border.) heard. Respiratory: Effort normal and breath sounds normal.  GI:  Abdomen is full but soft with mild tenderness in mid epigastrium and below the right costal margin. No organomegaly or masses.  Musculoskeletal: She exhibits no edema.  Lymphadenopathy:    She has no cervical adenopathy.  Neurological: She is alert.  Skin: Skin is warm and dry.     Assessment/Plan Solid food dysphagia. Intermittent nausea vomiting and abdomen upper abdominal pain. Diagnostic EGD and possible ED.  Lionel DecemberNajeeb Aanvi Voyles, MD 08/23/2016, 1:43 PM

## 2016-08-24 ENCOUNTER — Encounter (INDEPENDENT_AMBULATORY_CARE_PROVIDER_SITE_OTHER): Payer: Self-pay | Admitting: Internal Medicine

## 2016-08-25 ENCOUNTER — Other Ambulatory Visit (INDEPENDENT_AMBULATORY_CARE_PROVIDER_SITE_OTHER): Payer: Self-pay | Admitting: Internal Medicine

## 2016-08-29 ENCOUNTER — Other Ambulatory Visit (INDEPENDENT_AMBULATORY_CARE_PROVIDER_SITE_OTHER): Payer: Self-pay | Admitting: Internal Medicine

## 2016-08-29 ENCOUNTER — Encounter (HOSPITAL_COMMUNITY): Payer: Self-pay | Admitting: Internal Medicine

## 2016-08-29 MED ORDER — SUCRALFATE 1 G PO TABS: 1.0000 g | ORAL_TABLET | Freq: Three times a day (TID) | ORAL | 1 refills | Status: DC

## 2016-09-07 ENCOUNTER — Encounter (INDEPENDENT_AMBULATORY_CARE_PROVIDER_SITE_OTHER): Payer: Self-pay | Admitting: Internal Medicine

## 2016-09-11 ENCOUNTER — Telehealth (INDEPENDENT_AMBULATORY_CARE_PROVIDER_SITE_OTHER): Payer: Self-pay | Admitting: *Deleted

## 2016-09-11 NOTE — Telephone Encounter (Signed)
Patient is questioning the possibility of one of two medications may be making her sick. Sucralfate she takes 2 each evening with her Dinner , the Dicyclomine she is taking 1 before lunch and dinner. She says that she has stomach trouble but has noticed that it is worse after taking the mediations.  She complains of being sick on her stomach , and nausea.  [id number] [phone].

## 2016-09-13 NOTE — Telephone Encounter (Signed)
This was discussed with Dr.Rehman. He says that the patient may stop the Carafate,and Dicyclomine. Patient was called and this message was left on her home voicemail.

## 2016-09-22 ENCOUNTER — Other Ambulatory Visit (INDEPENDENT_AMBULATORY_CARE_PROVIDER_SITE_OTHER): Payer: Self-pay | Admitting: Internal Medicine

## 2016-10-05 ENCOUNTER — Ambulatory Visit (HOSPITAL_COMMUNITY): Payer: Medicare Other | Admitting: Psychiatry

## 2016-10-11 ENCOUNTER — Other Ambulatory Visit (INDEPENDENT_AMBULATORY_CARE_PROVIDER_SITE_OTHER): Payer: Self-pay | Admitting: *Deleted

## 2016-10-11 MED ORDER — SUCRALFATE 1 G PO TABS: 1.0000 g | ORAL_TABLET | Freq: Three times a day (TID) | ORAL | 1 refills | Status: DC

## 2016-10-17 ENCOUNTER — Ambulatory Visit (HOSPITAL_COMMUNITY): Payer: Medicare Other | Admitting: Psychiatry

## 2016-10-22 ENCOUNTER — Other Ambulatory Visit (INDEPENDENT_AMBULATORY_CARE_PROVIDER_SITE_OTHER): Payer: Self-pay | Admitting: Internal Medicine

## 2016-11-03 ENCOUNTER — Other Ambulatory Visit (INDEPENDENT_AMBULATORY_CARE_PROVIDER_SITE_OTHER): Payer: Self-pay | Admitting: Internal Medicine

## 2016-11-06 ENCOUNTER — Encounter (HOSPITAL_COMMUNITY): Payer: Self-pay | Admitting: Psychiatry

## 2016-11-06 ENCOUNTER — Ambulatory Visit (INDEPENDENT_AMBULATORY_CARE_PROVIDER_SITE_OTHER): Payer: Medicare Other | Admitting: Psychiatry

## 2016-11-06 ENCOUNTER — Telehealth (INDEPENDENT_AMBULATORY_CARE_PROVIDER_SITE_OTHER): Payer: Self-pay | Admitting: *Deleted

## 2016-11-06 VITALS — BP 130/80 | Ht 64.0 in | Wt 184.0 lb

## 2016-11-06 DIAGNOSIS — Z818 Family history of other mental and behavioral disorders: Secondary | ICD-10-CM | POA: Diagnosis not present

## 2016-11-06 DIAGNOSIS — Z79899 Other long term (current) drug therapy: Secondary | ICD-10-CM

## 2016-11-06 DIAGNOSIS — F3130 Bipolar disorder, current episode depressed, mild or moderate severity, unspecified: Secondary | ICD-10-CM | POA: Diagnosis not present

## 2016-11-06 DIAGNOSIS — G479 Sleep disorder, unspecified: Secondary | ICD-10-CM | POA: Diagnosis not present

## 2016-11-06 MED ORDER — ARIPIPRAZOLE 10 MG PO TABS: 10.0000 mg | ORAL_TABLET | Freq: Every day | ORAL | 2 refills | Status: DC

## 2016-11-06 MED ORDER — ZOLPIDEM TARTRATE 10 MG PO TABS: 10.0000 mg | ORAL_TABLET | Freq: Every day | ORAL | 2 refills | Status: DC

## 2016-11-06 MED ORDER — LAMOTRIGINE 200 MG PO TABS: 200.0000 mg | ORAL_TABLET | Freq: Every day | ORAL | 2 refills | Status: DC

## 2016-11-06 NOTE — Progress Notes (Signed)
Patient ID: Lindsay Palmer, female   DOB: [birth year], 76 y.o.   MRN: [id number] Patient ID: Lindsay Palmer, female   DOB: 11-22-1940, 76 y.o.   MRN: [id number] Patient ID: Lindsay Palmer, female   DOB: Jun 12, 1940, 76 y.o.   MRN: [id number]  Psychiatric  Adult follow-up  Patient Identification: Lindsay Palmer MRN:  [id number] Date of Evaluation:  11/06/2016 Referral Source: [facility] Chief Complaint:   Chief Complaint    Depression; Anxiety; Follow-up     Visit Diagnosis:    ICD-10-CM   1. Bipolar I disorder, most recent episode depressed (HCC) F31.30    Diagnosis:   Patient Active Problem List   Diagnosis Date Noted  . Other dysphagia [R13.19] 08/10/2016  . Bipolar I disorder, most recent episode depressed (HCC) [F31.30] 07/29/2014  . Unspecified constipation [K59.00] 09/05/2013  . 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 Anemia [D64.9] 07/23/2013  . Drug-induced pancreatitis DUE TO CONTRAST [K85.30] 07/19/2013  . Benign essential HTN [I10] 07/19/2013  . Urinary retention [R33.9] 07/19/2013  . Acute pancreatitis [K85.90] 07/19/2013  . Abdominal pain [R10.9] 07/10/2013   History of Present Illness:  This patient is a 76 year old married white female who lives with her husband in [place]. She has a son, daughter and 3 grandchildren. She was working in [employer] prior to her retirement.  The patient was referred by [facility] where she had been receiving treatment. Her physician recently left the practice and they are no longer taking her insurance. She has a long-term history of bipolar disorder.  The patient presents with her husband today. They have been married since she was 76 years old. He states that for much of their marriage she had bouts of severe mood swings. At times she was pleasant and easy to be with the other time she was angry irritable and out of control. She also went through periods of euphoria and spending a lot of money. At one point  they went to a family counselor who suspected she had bipolar disorder. She was eventually referred here to this office and saw Dr. Lolly Mustache. He started her on Abilify and it really helped in terms of decreasing the anger and irritability. Eventually however she started seeing Dr. Tomasa Rand at [facility] because she was still having difficulties with being depressed. He added Lamictal and this seemed to stabilize her mood and she was no longer depressed or manic. The patient has been on this combination of medicines for several years and she seems very stable now. Her only difficulty is that she doesn't sleep well even with the addition of Ambien 10 mg at bedtime.  At times the patient sleeps up to 5 hours at night and sometimes only 2 hours. She's tried almost every medication there is for sleep. Her mood is generally good. However she is often tired in her energy is variable. She had a history of pancreatitis last year after having ERCP and the contrast dye caused the pancreatitis. She still has bouts of nausea and weakness. She's been followed by GI in her laboratories are normal. Her husband thinks she spends too much time in bed. She spends most of her time doing housework. She is stressed because her son and her husband are not getting along . her son is on disability due to a back injury but is trying to work doing Aeronautical engineer which worries her  The patient has never had a psychiatric hospitalization. She does not use drugs or alcohol and has never had any psychotic  symptoms such as auditory or visual hallucinations or paranoia  The patient and her husband return after 3 months. She states that she is doing well mentally. She still having a lot of stomach problems. She intermittently just feels sick to her stomach. She recently had endoscopy that showed an esophageal lab that was dilated and some chronic irritation. Dr. Dionicia Abler is tried numerous medications but nothing seems to be helping. She  denies any manic or depressive symptoms. She sleeping well with Ambien and her mood is good. She still has little energy and does not have any incentive to exercise even for 5 minutes a day. A lot of this started with the pancreatitis but still was not resolved Elements:  Location:  Global. Quality:  Waxing and waning. Severity:  Low at this point, well controlled on medications. Timing:  Began when she was around 76 years old. Duration:  Years. Context:  Probably biological. Associated Signs/Symptoms: Depression Symptoms:  depressed mood, insomnia, psychomotor retardation, anxiety, disturbed sleep, (Hypo) Manic Symptoms:  Impulsivity, Irritable Mood,   Past Medical History:  Past Medical History:  Diagnosis Date  . Bipolar 2 disorder (HCC)   . Chronic insomnia   . Diverticulosis of colon   . Elevated LFTs   . Fatty liver disease, nonalcoholic   . GERD (gastroesophageal reflux disease)   . H/O first degree atrioventricular block   . Hypercholesteremia   . Hypertension   . Major depressive disorder   . Migraine   . Osteopenia   . Pancreatitis   . Sleep apnea    Stop Bang score of 4. Pt said she had sleep apnea at one time, but she has had another sleep study since then and they said she does not have it anymore.  . Vitamin D deficiency     Past Surgical History:  Procedure Laterality Date  . BILIARY STENT PLACEMENT N/A 07/18/2013   Procedure: BILIARY STENT PLACEMENT;  Surgeon: Malissa Hippo, MD;  Location: [facility];  Service: Endoscopy;  Laterality: N/A;  . Cataract Surgery Bilateral 08-2012  . CHOLECYSTECTOMY    . COLONOSCOPY  2003   Diverticulitis h/o polyps -due next 2015 -gastroscopy 2003  . ERCP    . ERCP N/A 07/18/2013   Procedure: ENDOSCOPIC RETROGRADE CHOLANGIOPANCREATOGRAPHY (ERCP) COMMON BILE DUCT BRUSHING;  Surgeon: Malissa Hippo, MD;  Location: [facility];  Service: Endoscopy;  Laterality: N/A;  . ERCP N/A 11/07/2013   Procedure: ENDOSCOPIC RETROGRADE  CHOLANGIOPANCREATOGRAPHY (ERCP);  Surgeon: Malissa Hippo, MD;  Location: [facility];  Service: Endoscopy;  Laterality: N/A;  . ESOPHAGEAL DILATION N/A 08/23/2016   Procedure: ESOPHAGEAL DILATION;  Surgeon: Malissa Hippo, MD;  Location: [facility];  Service: Endoscopy;  Laterality: N/A;  . ESOPHAGOGASTRODUODENOSCOPY N/A 08/23/2016   Procedure: ESOPHAGOGASTRODUODENOSCOPY (EGD);  Surgeon: Malissa Hippo, MD;  [facility]: [facility];  Service: Endoscopy;  Laterality: N/A;  245-moved up to 100 per [name]  . EYE SURGERY Bilateral    cataract removal  . NECK SURGERY     Stenosis C6-C7  . SIGMOIDOSCOPY  2007   no polyps  . SPHINCTEROTOMY N/A 07/18/2013   Procedure: SPHINCTEROTOMY;  Surgeon: Malissa Hippo, MD;  Location: [facility];  Service: Endoscopy;  Laterality: N/A;  . SPHINCTEROTOMY  11/07/2013   Procedure: EXTENSION OF SPHINCTEROTOMY;  Surgeon: Malissa Hippo, MD;  Location: [facility];  Service: Endoscopy;;  . STENT REMOVAL N/A 11/07/2013   Procedure: STENT REMOVAL;  Surgeon: Malissa Hippo, MD;  Location: [facility];  Service:  Endoscopy;  Laterality: N/A;   Family History:  Family History  Problem Relation Age of Onset  . Diabetes Mother   . Coronary artery disease Father   . Gout Father   . Breast cancer Sister   . Pancreatic cancer Brother   . Alcohol abuse Brother   . Healthy Brother   . Lung cancer Sister   . Healthy Sister   . Depression Sister   . Hyperlipidemia Daughter   . Hyperlipidemia Son   . Bipolar disorder Son   . Healthy Sister   . Healthy Sister   . Seizures Other    Social History:   Social History   Social History  . Marital status: Married    Spouse name: N/A  . Number of children: N/A  . Years of education: N/A   Social History Main Topics  . Smoking status: Never Smoker  . Smokeless tobacco: Never Used  . Alcohol use No  . Drug use: No  . Sexual activity: Not Asked   Other Topics Concern  . None   Social History Narrative  . None   Additional  Social History: The patient grew up in [place] with both parents. Her parents argued a lot but there was no domestic violence. The patient completed high school but married very young. 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 She denies any history of abuse. She has few interests or hobbies and does not get any exercise  Musculoskeletal: Strength & Muscle Tone: within normal limits Gait & Station: normal Patient leans: N/A  Psychiatric Specialty Exam: Depression         Past medical history includes anxiety.   Anxiety       Review of Systems  Gastrointestinal: Positive for abdominal pain.  Psychiatric/Behavioral: Positive for depression.  All other systems reviewed and are negative.   Blood pressure 130/80, height  (1.626 m), weight 184 lb (83.5 kg).Body mass index is 31.58 kg/m.  General Appearance: Casual, Neat and Well Groomed  Eye Contact:  Good  Speech:  Clear and Coherent  Volume:  Normal  Mood:  Euthymic   Affect: bright ,Little anxious   Thought Process:  Goal Directed  Orientation:  Full (Time, Place, and Person)  Thought Content:  WDL  Suicidal Thoughts:  No  Homicidal Thoughts:  No  Memory:  Immediate;   Fair Recent;   Fair Remote;   Fair  Judgement:  Good  Insight:  Good  Psychomotor Activity:  Normal  Concentration:  Fair  Recall:  Fair  Fund of Knowledge:Good  Language: Good  Akathisia:  No  Handed:  Right  AIMS (if indicated):    Assets:  Communication Skills Desire for Improvement Leisure Time Resilience Social Support  ADL's:  Intact  Cognition: WNL  Sleep:  Irritable but not as good as it should be    Is the patient at risk to self?  No. Has the patient been a risk to self in the past 6 months?  No. Has the patient been a risk to self within the distant past?  No. Is the patient a risk to others?  No. Has the patient been a risk to others in the past 6 months?   No. Has the patient been a risk to others within the distant past?  No.  Allergies:   Allergies  Allergen Reactions  . Codeine Nausea And Vomiting  . Oxycodone Nausea And Vomiting  Current Medications: Current Outpatient Prescriptions  Medication Sig Dispense Refill  . acetaminophen (TYLENOL) 500 MG tablet Take 500 mg by mouth daily as needed for moderate pain or headache.    Marland Kitchen amLODipine (NORVASC) 10 MG tablet Take 15 mg by mouth daily.    . ARIPiprazole (ABILIFY) 10 MG tablet Take 1 tablet (10 mg total) by mouth at bedtime. 100 tablet 2  . Cholecalciferol (VITAMIN D) 400 UNITS capsule Take 400 Units by mouth daily.    Marland Kitchen dicyclomine (BENTYL) 10 MG capsule TAKE 1 CAPSULE (10 MG TOTAL) BY MOUTH 2 (TWO) TIMES DAILY. BEFORE BREAKFAST AND LUNCH. 180 capsule 0  . Fish Oil-Cholecalciferol (FISH OIL + D3 PO) Take 1 capsule by mouth 2 (two) times daily.     Marland Kitchen HYDROcodone-acetaminophen (NORCO/VICODIN) 5-325 MG tablet Take 1 tablet by mouth 2 (two) times daily as needed for moderate pain. 60 tablet 0  . lamoTRIgine (LAMICTAL) 200 MG tablet Take 1 tablet (200 mg total) by mouth daily. 90 tablet 2  . losartan (COZAAR) 100 MG tablet Take 100 mg by mouth daily.    . meloxicam (MOBIC) 15 MG tablet Take 15 mg by mouth daily with supper.     . metoprolol tartrate (LOPRESSOR) 25 MG tablet Take 12.5 mg by mouth 2 (two) times daily.     Marland Kitchen omeprazole (PRILOSEC) 20 MG capsule Take 1 capsule (20 mg total) by mouth 2 (two) times daily before a meal. 180 capsule 3  . ondansetron (ZOFRAN) 4 MG tablet TAKE 1 TABLET BY MOUTH EVERY 8 HOURS AS NEEDED FOR NAUSEA OR VOMITING 30 tablet 0  . OVER THE COUNTER MEDICATION Fiber well - 5 grams 1 chewable tablet twice daily.    . polyethylene glycol powder (GLYCOLAX/MIRALAX) powder TAKE 8.5 GRAMS(1/2 CAPFUL) MIXED IN FLUID AND DRANK EVERY OTHER DAY (Patient taking differently: TAKE 17 G MIXED IN FLUID AND DRINK AS NEEDED FOR CONSTIPATION) 1581 g 5  . potassium chloride (K-DUR)  10 MEQ tablet Take 2 tablets (20 mEq total) by mouth as needed. Patient is to take this on the days she takes the Demadex. 30 tablet 3  . simvastatin (ZOCOR) 20 MG tablet Take 20 mg by mouth every evening.     . sucralfate (CARAFATE) 1 g tablet Take 1 tablet (1 g total) by mouth 4 (four) times daily -  with meals and at bedtime. 120 tablet 1  . torsemide (DEMADEX) 20 MG tablet TAKE 1 TABLET BY MOUTH DAILY AS NEEDED FOR SWELLING 30 tablet 3  . vitamin B-12 (CYANOCOBALAMIN) 1000 MCG tablet Take 1,000 mcg by mouth daily.    Marland Kitchen zolpidem (AMBIEN) 10 MG tablet Take 1 tablet (10 mg total) by mouth at bedtime. 90 tablet 2   No current facility-administered medications for this visit.     Previous Psychotropic Medications: Yes   Substance Abuse History in the last 12 months:  No.  Consequences of Substance Abuse: NA  Medical Decision Making:  Established Problem, Stable/Improving (1), Review or order clinical lab tests (1), Decision to obtain old records (1), Review and summation of old records (2) and Review of Medication Regimen & Side Effects (2)  Treatment Plan Summary: Medication management  The patient seems to be stable on her current combination of Abilify and Lamictal for bipolar disorder. These medications will be continued. The Ambien 10 mg will be continued at bedtime for sleep She'll return to see me in 3 months but call if any of her symptoms worsen.    [facility], [facility]  10/8/20183:31 PM Patient ID: Lindsay Palmer, female   DOB: 12/08/1940, 76 y.o.   MRN: [id number]

## 2016-11-06 NOTE — Telephone Encounter (Signed)
Rx for Dicyclomine 10 mg capsule was called in ,then we rec'd  A Response Requested - Product backordered/Unavailable. Per Dr. Karilyn Cota may call in the Levbid 1/2 tablet - patient is to take everyday #15 with 5 refills. This was called to her pharmacy and the verbal was given to [place].

## 2016-11-07 ENCOUNTER — Encounter (INDEPENDENT_AMBULATORY_CARE_PROVIDER_SITE_OTHER): Payer: Self-pay | Admitting: Internal Medicine

## 2016-11-07 ENCOUNTER — Ambulatory Visit (INDEPENDENT_AMBULATORY_CARE_PROVIDER_SITE_OTHER): Payer: Medicare Other | Admitting: Internal Medicine

## 2016-11-07 VITALS — BP 118/60 | HR 63 | Temp 97.9°F | Resp 18 | Ht 64.0 in | Wt 182.1 lb

## 2016-11-07 DIAGNOSIS — R110 Nausea: Secondary | ICD-10-CM | POA: Diagnosis not present

## 2016-11-07 DIAGNOSIS — K589 Irritable bowel syndrome without diarrhea: Secondary | ICD-10-CM | POA: Diagnosis not present

## 2016-11-07 DIAGNOSIS — K3184 Gastroparesis: Secondary | ICD-10-CM | POA: Diagnosis not present

## 2016-11-07 NOTE — Progress Notes (Signed)
Presenting complaint;  Follow-up for nausea abdominal pain/IBS.  Subjective:  Patient is 76 year old Caucasian female who is here for scheduled visit. She was last seen about 3 months ago. She is accompanied by her husband Mellody Dance. She states she is having less episodes of abdominal pain and nausea. Nausea generally occurs in late afternoon or evening. It is never associated with vomiting. She is taking ondansetron every now and then it and it helps. She took a dose 2 days ago. She is not having any side effects with this medication. She is also having less abdominal pain. She states she still has pain medication left from her last prescription which was given 90 days ago. She may take a pain medication once or twice a week or even less often. Bowels move 4-5 times a week. She uses polyethylene glycol on as-needed basis. She is having to use dicyclomine less often. She decided to stop meloxicam last week to see what would help improve GI symptoms. She is not experiencing joint pains. He continues to complain of feeling tired and weak. According to her husband she does not do much physical activity. Patient states she had blood work few weeks ago by Dr. Earna Coder was normal.      Current Medications: Outpatient Encounter Prescriptions as of 11/07/2016  Medication Sig  . acetaminophen (TYLENOL) 500 MG tablet Take 500 mg by mouth daily as needed for moderate pain or headache.  Marland Kitchen amLODipine (NORVASC) 10 MG tablet Take 15 mg by mouth daily.  . ARIPiprazole (ABILIFY) 10 MG tablet Take 1 tablet (10 mg total) by mouth at bedtime.  . Cholecalciferol (VITAMIN D) 400 UNITS capsule Take 400 Units by mouth daily.  . Fish Oil-Cholecalciferol (FISH OIL + D3 PO) Take 1 capsule by mouth 2 (two) times daily.   Marland Kitchen HYDROcodone-acetaminophen (NORCO/VICODIN) 5-325 MG tablet Take 1 tablet by mouth 2 (two) times daily as needed for moderate pain.  Marland Kitchen lamoTRIgine (LAMICTAL) 200 MG tablet Take 1 tablet (200 mg total) by mouth  daily.  Marland Kitchen losartan (COZAAR) 100 MG tablet Take 100 mg by mouth daily.  . meloxicam (MOBIC) 15 MG tablet Take 15 mg by mouth daily with supper.   . metoprolol tartrate (LOPRESSOR) 25 MG tablet Take 12.5 mg by mouth 2 (two) times daily.   Marland Kitchen omeprazole (PRILOSEC) 20 MG capsule Take 1 capsule (20 mg total) by mouth 2 (two) times daily before a meal.  . ondansetron (ZOFRAN) 4 MG tablet TAKE 1 TABLET BY MOUTH EVERY 8 HOURS AS NEEDED FOR NAUSEA OR VOMITING  . OVER THE COUNTER MEDICATION Fiber well - 5 grams 1 chewable tablet twice daily.  . polyethylene glycol powder (GLYCOLAX/MIRALAX) powder TAKE 8.5 GRAMS(1/2 CAPFUL) MIXED IN FLUID AND DRANK EVERY OTHER DAY (Patient taking differently: TAKE 17 G MIXED IN FLUID AND DRINK AS NEEDED FOR CONSTIPATION)  . potassium chloride (K-DUR) 10 MEQ tablet Take 2 tablets (20 mEq total) by mouth as needed. Patient is to take this on the days she takes the Demadex.  . simvastatin (ZOCOR) 20 MG tablet Take 20 mg by mouth every evening.   . torsemide (DEMADEX) 20 MG tablet TAKE 1 TABLET BY MOUTH DAILY AS NEEDED FOR SWELLING  . vitamin B-12 (CYANOCOBALAMIN) 1000 MCG tablet Take 1,000 mcg by mouth daily.  Marland Kitchen zolpidem (AMBIEN) 10 MG tablet Take 1 tablet (10 mg total) by mouth at bedtime.  . dicyclomine (BENTYL) 10 MG capsule TAKE 1 CAPSULE (10 MG TOTAL) BY MOUTH 2 (TWO) TIMES DAILY. BEFORE BREAKFAST AND  LUNCH. (Patient not taking: Reported on 11/07/2016)  . [DISCONTINUED] sucralfate (CARAFATE) 1 g tablet Take 1 tablet (1 g total) by mouth 4 (four) times daily -  with meals and at bedtime. (Patient not taking: Reported on 11/07/2016)   No facility-administered encounter medications on file as of 11/07/2016.      Objective: Blood pressure 118/60, pulse 63, temperature 97.9 F (36.6 C), temperature source Oral, resp. rate 18, height  (1.626 m), weight 182 lb 1.6 oz (82.6 kg). Patient is alert and in no acute distress. Conjunctiva is pink. Sclera is  nonicteric Oropharyngeal mucosa is normal. No neck masses or thyromegaly noted. Cardiac exam with regular rhythm normal S1 and S2. No murmur or gallop noted. Lungs are clear to auscultation. Abdomen is full. Bowel sounds are normal. No bruits noted. Abdomen is soft and nontender without organomegaly or masses.  She has 1+ pitting edema around her ankles. She does not have clubbing.  Labs/studies Results:   LFTs from 08/08/2016 Bilirubin 0.8, AP 139, AST 24, ALT 27, total protein 7.9 and albumin 4.0.  Assessment:  #1. Nausea without vomiting. She had EGD about 10 weeks ago and it was negative for H. pylori infection or peptic ulcer disease. She had gastric emptying study in January 2017 which revealed very mild gastroparesis. I do not believe nausea is due to gastroparesis. I believe her nausea is central in origin. She can use ondansetron more frequently and see if she could prevent these spells. Keeping symptom diary would help.  #2. Irritable bowel syndrome. She is having less abdominal pain now that she is having less problems with constipation. She is not using dicyclomine often which is reassuring.  #3. 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 She has very mildly elevated alkaline phosphatase most likely due to her medications. Transaminases remain normal. No further workup at this time.   Plan:  Decrease omeprazole to 40 mg alternate with 20 mg every other day. I let she starts having heartburn or regurgitation she can drop the dose to 20 mg daily after 1 month. Patient encouraged to use ondansetron more often. Once again patient counseled but need for her to do exercise on regular basis. Office visit in 6 months.

## 2016-11-07 NOTE — Patient Instructions (Signed)
Notify if nausea gets worse. Decrease omeprazole to 40 mg( 20 mg twice a day) alternating with 20 mg every other day. Decrease omeprazole dose further to 20 mg every morning after 1 month. Remember to exercise daily. Start with few minutes that time and gradually increase duration to 30 minutes per day.

## 2016-11-28 ENCOUNTER — Ambulatory Visit (INDEPENDENT_AMBULATORY_CARE_PROVIDER_SITE_OTHER): Payer: Self-pay | Admitting: Internal Medicine

## 2017-01-10 ENCOUNTER — Emergency Department (HOSPITAL_COMMUNITY): Payer: Medicare Other

## 2017-01-10 ENCOUNTER — Inpatient Hospital Stay (HOSPITAL_COMMUNITY)
Admission: EM | Admit: 2017-01-10 | Discharge: 2017-01-13 | DRG: 392 | Disposition: A | Discharge status: Home / Self Care | Payer: Medicare Other | Attending: Family Medicine | Admitting: Family Medicine

## 2017-01-10 ENCOUNTER — Encounter (HOSPITAL_COMMUNITY): Payer: Self-pay | Admitting: *Deleted

## 2017-01-10 ENCOUNTER — Other Ambulatory Visit: Payer: Self-pay

## 2017-01-10 ENCOUNTER — Telehealth (INDEPENDENT_AMBULATORY_CARE_PROVIDER_SITE_OTHER): Payer: Self-pay | Admitting: *Deleted

## 2017-01-10 DIAGNOSIS — Z818 Family history of other mental and behavioral disorders: Secondary | ICD-10-CM

## 2017-01-10 DIAGNOSIS — Z811 Family history of alcohol abuse and dependence: Secondary | ICD-10-CM

## 2017-01-10 DIAGNOSIS — I10 Essential (primary) hypertension: Secondary | ICD-10-CM | POA: Diagnosis present

## 2017-01-10 DIAGNOSIS — I440 Atrioventricular block, first degree: Secondary | ICD-10-CM | POA: Diagnosis present

## 2017-01-10 DIAGNOSIS — Z9841 Cataract extraction status, right eye: Secondary | ICD-10-CM

## 2017-01-10 DIAGNOSIS — K5720 Diverticulitis of large intestine with perforation and abscess without bleeding: Secondary | ICD-10-CM | POA: Diagnosis not present

## 2017-01-10 DIAGNOSIS — Z9842 Cataract extraction status, left eye: Secondary | ICD-10-CM | POA: Diagnosis not present

## 2017-01-10 DIAGNOSIS — F3130 Bipolar disorder, current episode depressed, mild or moderate severity, unspecified: Secondary | ICD-10-CM | POA: Diagnosis not present

## 2017-01-10 DIAGNOSIS — K5792 Diverticulitis of intestine, part unspecified, without perforation or abscess without bleeding: Secondary | ICD-10-CM | POA: Diagnosis not present

## 2017-01-10 DIAGNOSIS — M8580 Other specified disorders of bone density and structure, unspecified site: Secondary | ICD-10-CM | POA: Diagnosis present

## 2017-01-10 DIAGNOSIS — Z8249 Family history of ischemic heart disease and other diseases of the circulatory system: Secondary | ICD-10-CM | POA: Diagnosis not present

## 2017-01-10 DIAGNOSIS — Z885 Allergy status to narcotic agent status: Secondary | ICD-10-CM

## 2017-01-10 DIAGNOSIS — Z801 Family history of malignant neoplasm of trachea, bronchus and lung: Secondary | ICD-10-CM

## 2017-01-10 DIAGNOSIS — Z833 Family history of diabetes mellitus: Secondary | ICD-10-CM | POA: Diagnosis not present

## 2017-01-10 DIAGNOSIS — Z791 Long term (current) use of non-steroidal anti-inflammatories (NSAID): Secondary | ICD-10-CM

## 2017-01-10 DIAGNOSIS — E860 Dehydration: Secondary | ICD-10-CM | POA: Diagnosis present

## 2017-01-10 DIAGNOSIS — Z79891 Long term (current) use of opiate analgesic: Secondary | ICD-10-CM

## 2017-01-10 DIAGNOSIS — K219 Gastro-esophageal reflux disease without esophagitis: Secondary | ICD-10-CM | POA: Diagnosis present

## 2017-01-10 DIAGNOSIS — F5104 Psychophysiologic insomnia: Secondary | ICD-10-CM | POA: Diagnosis present

## 2017-01-10 DIAGNOSIS — E559 Vitamin D deficiency, unspecified: Secondary | ICD-10-CM | POA: Diagnosis present

## 2017-01-10 DIAGNOSIS — Z803 Family history of malignant neoplasm of breast: Secondary | ICD-10-CM

## 2017-01-10 DIAGNOSIS — Z9049 Acquired absence of other specified parts of digestive tract: Secondary | ICD-10-CM | POA: Diagnosis not present

## 2017-01-10 DIAGNOSIS — G4730 Sleep apnea, unspecified: Secondary | ICD-10-CM | POA: Diagnosis present

## 2017-01-10 DIAGNOSIS — F3181 Bipolar II disorder: Secondary | ICD-10-CM | POA: Diagnosis present

## 2017-01-10 DIAGNOSIS — K5900 Constipation, unspecified: Secondary | ICD-10-CM | POA: Diagnosis not present

## 2017-01-10 DIAGNOSIS — Z79899 Other long term (current) drug therapy: Secondary | ICD-10-CM

## 2017-01-10 DIAGNOSIS — K760 Fatty (change of) liver, not elsewhere classified: Secondary | ICD-10-CM | POA: Diagnosis present

## 2017-01-10 DIAGNOSIS — Z86010 Personal history of colonic polyps: Secondary | ICD-10-CM

## 2017-01-10 DIAGNOSIS — Z800 Family history of malignant neoplasm of digestive organs: Secondary | ICD-10-CM

## 2017-01-10 DIAGNOSIS — D649 Anemia, unspecified: Secondary | ICD-10-CM | POA: Diagnosis present

## 2017-01-10 DIAGNOSIS — M25512 Pain in left shoulder: Secondary | ICD-10-CM | POA: Diagnosis present

## 2017-01-10 DIAGNOSIS — K581 Irritable bowel syndrome with constipation: Secondary | ICD-10-CM | POA: Diagnosis present

## 2017-01-10 DIAGNOSIS — R339 Retention of urine, unspecified: Secondary | ICD-10-CM | POA: Diagnosis present

## 2017-01-10 DIAGNOSIS — E7800 Pure hypercholesterolemia, unspecified: Secondary | ICD-10-CM | POA: Diagnosis present

## 2017-01-10 LAB — URINALYSIS, ROUTINE W REFLEX MICROSCOPIC
Bilirubin Urine: NEGATIVE
Glucose, UA: NEGATIVE mg/dL
Hgb urine dipstick: NEGATIVE
KETONES UR: 20 mg/dL — AB
Nitrite: NEGATIVE
PROTEIN: 30 mg/dL — AB
Specific Gravity, Urine: 1.029 (ref 1.005–1.030)
pH: 5 (ref 5.0–8.0)

## 2017-01-10 LAB — CBC
HEMATOCRIT: 35.9 % — AB (ref 36.0–46.0)
HEMOGLOBIN: 11 g/dL — AB (ref 12.0–15.0)
MCH: 27.8 pg (ref 26.0–34.0)
MCHC: 30.6 g/dL (ref 30.0–36.0)
MCV: 90.9 fL (ref 78.0–100.0)
Platelets: 316 10*3/uL (ref 150–400)
RBC: 3.95 MIL/uL (ref 3.87–5.11)
RDW: 14.3 % (ref 11.5–15.5)
WBC: 10.4 10*3/uL (ref 4.0–10.5)

## 2017-01-10 LAB — COMPREHENSIVE METABOLIC PANEL
ALBUMIN: 3.1 g/dL — AB (ref 3.5–5.0)
ALK PHOS: 294 U/L — AB (ref 38–126)
ALT: 21 U/L (ref 14–54)
ANION GAP: 10 (ref 5–15)
AST: 24 U/L (ref 15–41)
BUN: 17 mg/dL (ref 6–20)
CALCIUM: 9.3 mg/dL (ref 8.9–10.3)
CHLORIDE: 99 mmol/L — AB (ref 101–111)
CO2: 28 mmol/L (ref 22–32)
Creatinine, Ser: 0.9 mg/dL (ref 0.44–1.00)
GFR calc Af Amer: >60 mL/min (ref >60)
GFR calc non Af Amer: >60 mL/min (ref >60)
GLUCOSE: 105 mg/dL — AB (ref 65–99)
Potassium: 4.1 mmol/L (ref 3.5–5.1)
SODIUM: 137 mmol/L (ref 135–145)
Total Bilirubin: 0.6 mg/dL (ref 0.3–1.2)
Total Protein: 7.7 g/dL (ref 6.5–8.1)

## 2017-01-10 LAB — LIPASE, BLOOD: Lipase: 15 U/L (ref 11–51)

## 2017-01-10 MED ORDER — CEFTRIAXONE SODIUM 2 G IJ SOLR
2.0000 g | Freq: Once | INTRAMUSCULAR | Status: AC
  Administered 2017-01-10: 2 g via INTRAVENOUS
  Filled 2017-01-10: qty 2

## 2017-01-10 MED ORDER — PIPERACILLIN-TAZOBACTAM 3.375 G IVPB
3.3750 g | Freq: Three times a day (TID) | INTRAVENOUS | Status: DC
  Administered 2017-01-10 – 2017-01-13 (×8): 3.375 g via INTRAVENOUS
  Filled 2017-01-10 (×8): qty 50

## 2017-01-10 MED ORDER — ONDANSETRON HCL 4 MG/2ML IJ SOLN
4.0000 mg | Freq: Once | INTRAMUSCULAR | Status: AC
  Administered 2017-01-10: 4 mg via INTRAVENOUS
  Filled 2017-01-10: qty 2

## 2017-01-10 MED ORDER — MORPHINE SULFATE (PF) 2 MG/ML IV SOLN
1.0000 mg | INTRAVENOUS | Status: DC | PRN
  Administered 2017-01-11 – 2017-01-12 (×4): 1 mg via INTRAVENOUS
  Filled 2017-01-10 (×4): qty 1

## 2017-01-10 MED ORDER — PANTOPRAZOLE SODIUM 40 MG PO TBEC
40.0000 mg | DELAYED_RELEASE_TABLET | Freq: Every day | ORAL | Status: DC
  Administered 2017-01-10 – 2017-01-13 (×4): 40 mg via ORAL
  Filled 2017-01-10 (×4): qty 1

## 2017-01-10 MED ORDER — ZOLPIDEM TARTRATE 5 MG PO TABS
5.0000 mg | ORAL_TABLET | Freq: Every day | ORAL | Status: DC
  Administered 2017-01-10 – 2017-01-12 (×3): 5 mg via ORAL
  Filled 2017-01-10 (×3): qty 1

## 2017-01-10 MED ORDER — LACTATED RINGERS IV SOLN
INTRAVENOUS | Status: AC
  Administered 2017-01-10: 20:00:00 via INTRAVENOUS

## 2017-01-10 MED ORDER — ENOXAPARIN SODIUM 40 MG/0.4ML ~~LOC~~ SOLN
40.0000 mg | SUBCUTANEOUS | Status: DC
  Administered 2017-01-10 – 2017-01-12 (×3): 40 mg via SUBCUTANEOUS
  Filled 2017-01-10 (×3): qty 0.4

## 2017-01-10 MED ORDER — BISACODYL 10 MG RE SUPP: 10.0000 mg | Freq: Every day | RECTAL | Status: DC | PRN

## 2017-01-10 MED ORDER — ARIPIPRAZOLE 10 MG PO TABS
10.0000 mg | ORAL_TABLET | Freq: Every day | ORAL | Status: DC
  Administered 2017-01-10 – 2017-01-12 (×3): 10 mg via ORAL
  Filled 2017-01-10 (×3): qty 1

## 2017-01-10 MED ORDER — MORPHINE SULFATE (PF) 4 MG/ML IV SOLN
4.0000 mg | Freq: Once | INTRAVENOUS | Status: AC
  Administered 2017-01-10: 4 mg via INTRAVENOUS
  Filled 2017-01-10: qty 1

## 2017-01-10 MED ORDER — AMLODIPINE BESYLATE 5 MG PO TABS
15.0000 mg | ORAL_TABLET | Freq: Every day | ORAL | Status: DC
  Administered 2017-01-10 – 2017-01-13 (×4): 15 mg via ORAL
  Filled 2017-01-10 (×4): qty 3

## 2017-01-10 MED ORDER — METRONIDAZOLE IN NACL 5-0.79 MG/ML-% IV SOLN
500.0000 mg | Freq: Once | INTRAVENOUS | Status: AC
  Administered 2017-01-10: 500 mg via INTRAVENOUS
  Filled 2017-01-10: qty 100

## 2017-01-10 MED ORDER — IOPAMIDOL (ISOVUE-300) INJECTION 61%
100.0000 mL | Freq: Once | INTRAVENOUS | Status: AC | PRN
  Administered 2017-01-10: 100 mL via INTRAVENOUS

## 2017-01-10 MED ORDER — ACETAMINOPHEN 325 MG PO TABS: 650.0000 mg | ORAL_TABLET | Freq: Four times a day (QID) | ORAL | Status: DC | PRN

## 2017-01-10 MED ORDER — METOPROLOL TARTRATE 25 MG PO TABS
12.5000 mg | ORAL_TABLET | Freq: Two times a day (BID) | ORAL | Status: DC
  Administered 2017-01-11 – 2017-01-13 (×5): 12.5 mg via ORAL
  Filled 2017-01-10 (×6): qty 1

## 2017-01-10 MED ORDER — ACETAMINOPHEN 500 MG PO TABS
1000.0000 mg | ORAL_TABLET | Freq: Once | ORAL | Status: AC
  Administered 2017-01-10: 1000 mg via ORAL
  Filled 2017-01-10: qty 2

## 2017-01-10 MED ORDER — ONDANSETRON HCL 4 MG/2ML IJ SOLN
4.0000 mg | Freq: Four times a day (QID) | INTRAMUSCULAR | Status: DC | PRN
  Administered 2017-01-11: 4 mg via INTRAVENOUS
  Filled 2017-01-10: qty 2

## 2017-01-10 MED ORDER — ACETAMINOPHEN 650 MG RE SUPP: 650.0000 mg | Freq: Four times a day (QID) | RECTAL | Status: DC | PRN

## 2017-01-10 MED ORDER — CHOLECALCIFEROL 10 MCG (400 UNIT) PO TABS
400.0000 [IU] | ORAL_TABLET | Freq: Every day | ORAL | Status: DC
  Administered 2017-01-10 – 2017-01-13 (×4): 400 [IU] via ORAL
  Filled 2017-01-10 (×4): qty 1

## 2017-01-10 MED ORDER — ACETAMINOPHEN 500 MG PO TABS
500.0000 mg | ORAL_TABLET | Freq: Every day | ORAL | Status: DC | PRN
  Administered 2017-01-11: 500 mg via ORAL
  Filled 2017-01-10: qty 1

## 2017-01-10 MED ORDER — ONDANSETRON HCL 4 MG PO TABS: 4.0000 mg | ORAL_TABLET | Freq: Four times a day (QID) | ORAL | Status: DC | PRN

## 2017-01-10 MED ORDER — SODIUM CHLORIDE 0.9 % IV BOLUS (SEPSIS)
1000.0000 mL | Freq: Once | INTRAVENOUS | Status: AC
  Administered 2017-01-10: 1000 mL via INTRAVENOUS

## 2017-01-10 MED ORDER — LOSARTAN POTASSIUM 50 MG PO TABS
100.0000 mg | ORAL_TABLET | Freq: Every day | ORAL | Status: DC
  Administered 2017-01-11 – 2017-01-13 (×3): 100 mg via ORAL
  Filled 2017-01-10 (×6): qty 2

## 2017-01-10 MED ORDER — LAMOTRIGINE 100 MG PO TABS
200.0000 mg | ORAL_TABLET | Freq: Every day | ORAL | Status: DC
  Administered 2017-01-10 – 2017-01-13 (×4): 200 mg via ORAL
  Filled 2017-01-10 (×4): qty 2

## 2017-01-10 MED ORDER — DICYCLOMINE HCL 10 MG PO CAPS: 10.0000 mg | ORAL_CAPSULE | Freq: Two times a day (BID) | ORAL | Status: DC | PRN

## 2017-01-10 NOTE — ED Triage Notes (Signed)
Abdominal pain for 2 weeks 

## 2017-01-10 NOTE — H&P (Signed)
History and Physical    Lindsay Palmer ENI:[id number] DOB: 02-21-1940 DOA: 01/10/2017  PCP: Ephriam Jenkins E   Patient coming from: Home  Chief Complaint: Periumbilical and lower abdominal pain x 2 weeks  HPI: Lindsay Palmer is a 76 y.o. female with medical history significant for diverticulosis, nonalcoholic fatty liver disease, prior pancreatitis with biliary stricture, and prior cholecystectomy who presents to the emergency department at the urging of her gastroenterologist Dr. Laural Golden on account of the periumbilical as well as lower abdominal pain that has been persistent for the last 2 weeks.  She typically has periods of intermittent abdominal pain that is similar nature and she states that this is been ongoing for quite some time even after an ERCP that she had 2 years ago.  She has had quite a poor appetite and has been persistently nauseated, and therefore she has not been eating much at all.  She denies any actual vomiting or diarrhea.  She has been able to take her usual medications as prescribed.   ED Course: Her vital signs are noted to be stable but she is febrile with a temperature of 101.2 Fahrenheit.  Her labs do not demonstrate any significant abnormalities aside from some mild anemia and alkaline phosphatase elevation (this appears to be persistent per conversation with Dr. Laural Golden).  CT of her abdomen and pelvis with contrast demonstrates diverticulosis with pericolonic inflammation and a small amount of free fluid likely suggestive of diverticulitis.  She has been given some Rocephin as well as Flagyl for empiric treatment as well as some IV fluid.  She states that she received some morphine which gave her relief of her pain.  Review of Systems: As per HPI otherwise 10 point review of systems negative.   Past Medical History:  Diagnosis Date  . Bipolar 2 disorder ([place])   . Chronic insomnia   . Diverticulosis of colon   . Elevated LFTs   . Fatty liver disease, nonalcoholic   .  GERD (gastroesophageal reflux disease)   . H/O first degree atrioventricular block   . Hypercholesteremia   . Hypertension   . Major depressive disorder   . Migraine   . Osteopenia   . Pancreatitis   . Sleep apnea    Stop Bang score of 4. Pt said she had sleep apnea at one time, but she has had another sleep study since then and they said she does not have it anymore.  . Vitamin D deficiency     Past Surgical History:  Procedure Laterality Date  . BILIARY STENT PLACEMENT N/A 07/18/2013   Procedure: BILIARY STENT PLACEMENT;  Surgeon: Rogene Houston, MD;  Location: [facility];  Service: Endoscopy;  Laterality: N/A;  . Cataract Surgery Bilateral 08-2012  . CHOLECYSTECTOMY    . COLONOSCOPY  2003   Diverticulitis h/o polyps -due next 2015 -gastroscopy 2003  . ERCP    . ERCP N/A 07/18/2013   Procedure: ENDOSCOPIC RETROGRADE CHOLANGIOPANCREATOGRAPHY (ERCP) COMMON BILE DUCT BRUSHING;  Surgeon: Rogene Houston, MD;  Location: [facility];  Service: Endoscopy;  Laterality: N/A;  . ERCP N/A 11/07/2013   Procedure: ENDOSCOPIC RETROGRADE CHOLANGIOPANCREATOGRAPHY (ERCP);  Surgeon: Rogene Houston, MD;  Location: [facility];  Service: Endoscopy;  Laterality: N/A;  . ESOPHAGEAL DILATION N/A 08/23/2016   Procedure: ESOPHAGEAL DILATION;  Surgeon: Rogene Houston, MD;  Location: [facility];  Service: Endoscopy;  Laterality: N/A;  . ESOPHAGOGASTRODUODENOSCOPY N/A 08/23/2016   Procedure: ESOPHAGOGASTRODUODENOSCOPY (EGD);  Surgeon: Rogene Houston, MD;  Location: [facility];  Service: Endoscopy;  Laterality: N/A;  245-moved up to 100 per Lelon Frohlich  . EYE SURGERY Bilateral    cataract removal  . NECK SURGERY     Stenosis C6-C7  . SIGMOIDOSCOPY  2007   no polyps  . SPHINCTEROTOMY N/A 07/18/2013   Procedure: SPHINCTEROTOMY;  Surgeon: Rogene Houston, MD;  Location: [facility];  Service: Endoscopy;  Laterality: N/A;  . SPHINCTEROTOMY  11/07/2013   Procedure: EXTENSION OF SPHINCTEROTOMY;  Surgeon: Rogene Houston, MD;   Location: [facility];  Service: Endoscopy;;  . STENT REMOVAL N/A 11/07/2013   Procedure: STENT REMOVAL;  Surgeon: Rogene Houston, MD;  Location: [facility];  Service: Endoscopy;  Laterality: N/A;     reports that  has never smoked. she has never used smokeless tobacco. She reports that she does not drink alcohol or use drugs.  Allergies  Allergen Reactions  . Codeine Nausea And Vomiting  . Oxycodone Nausea And Vomiting    Family History  Problem Relation Age of Onset  . Diabetes Mother   . Coronary artery disease Father   . Gout Father   . Breast cancer Sister   . Pancreatic cancer Brother   . Alcohol abuse Brother   . Healthy Brother   . Lung cancer Sister   . Healthy Sister   . Depression Sister   . Hyperlipidemia Daughter   . Hyperlipidemia Son   . Bipolar disorder Son   . Healthy Sister   . Healthy Sister   . Seizures Other     Prior to Admission medications   Medication Sig Start Date End Date Taking? Authorizing Provider  acetaminophen (TYLENOL) 500 MG tablet Take 500 mg by mouth daily as needed for moderate pain or headache.    [provider]  amLODipine (NORVASC) 10 MG tablet Take 15 mg by mouth daily.    [provider]  ARIPiprazole (ABILIFY) 10 MG tablet Take 1 tablet (10 mg total) by mouth at bedtime. 11/06/16   Cloria Spring, MD  Cholecalciferol (VITAMIN D) 400 UNITS capsule Take 400 Units by mouth daily.    [provider]  dicyclomine (BENTYL) 10 MG capsule Take 1 capsule (10 mg total) by mouth 2 (two) times daily as needed for spasms. before breakfast and lunch. 11/07/16   Rehman, Mechele Dawley, MD  Fish Oil-Cholecalciferol (FISH OIL + D3 PO) Take 1 capsule by mouth 2 (two) times daily.     [provider]  HYDROcodone-acetaminophen (NORCO/VICODIN) 5-325 MG tablet Take 1 tablet by mouth 2 (two) times daily as needed for moderate pain. 08/08/16   Rogene Houston, MD  lamoTRIgine (LAMICTAL) 200 MG tablet Take 1 tablet (200 mg total) by  mouth daily. 11/06/16   Cloria Spring, MD  losartan (COZAAR) 100 MG tablet Take 100 mg by mouth daily.    [provider]  meloxicam (MOBIC) 15 MG tablet Take 15 mg by mouth daily with supper.  06/29/13   [provider]  metoprolol tartrate (LOPRESSOR) 25 MG tablet Take 12.5 mg by mouth 2 (two) times daily.     [provider]  omeprazole (PRILOSEC) 20 MG capsule Take 1 capsule (20 mg total) by mouth 2 (two) times daily before a meal. 06/01/16   Setzer, Terri L, NP  ondansetron (ZOFRAN) 4 MG tablet TAKE 1 TABLET BY MOUTH EVERY 8 HOURS AS NEEDED FOR NAUSEA OR VOMITING 09/15/15   Setzer, Rona Ravens, NP  OVER THE COUNTER MEDICATION Fiber well - 5 grams  1 chewable tablet twice daily.    [provider]  polyethylene glycol powder (GLYCOLAX/MIRALAX) powder TAKE 8.5 GRAMS(1/2 CAPFUL) MIXED IN FLUID AND DRANK EVERY OTHER DAY Patient taking differently: TAKE 17 G MIXED IN FLUID AND DRINK AS NEEDED FOR CONSTIPATION 05/01/16   Setzer, Terri L, NP  potassium chloride (K-DUR) 10 MEQ tablet Take 2 tablets (20 mEq total) by mouth as needed. Patient is to take this on the days she takes the Demadex. 05/04/16   Rehman, Mechele Dawley, MD  simvastatin (ZOCOR) 20 MG tablet Take 20 mg by mouth every evening.     [provider]  torsemide (DEMADEX) 20 MG tablet TAKE 1 TABLET BY MOUTH DAILY AS NEEDED FOR SWELLING 11/03/16   Setzer, Rona Ravens, NP  vitamin B-12 (CYANOCOBALAMIN) 1000 MCG tablet Take 1,000 mcg by mouth daily.    [provider]  zolpidem (AMBIEN) 10 MG tablet Take 1 tablet (10 mg total) by mouth at bedtime. 11/06/16   Cloria Spring, MD    Physical Exam: Vitals:   01/10/17 1700 01/10/17 1731 01/10/17 1800 01/10/17 1810  BP: (!) 146/57  (!) 162/55   Pulse: 84  87   Resp:      Temp:  (!) 101.2 F (38.4 C)  99.8 F (37.7 C)  TempSrc:  Oral  Oral  SpO2: 92% 93% 90%   Weight:      Height:        Constitutional: NAD, calm, comfortable Vitals:   01/10/17 1700  01/10/17 1731 01/10/17 1800 01/10/17 1810  BP: (!) 146/57  (!) 162/55   Pulse: 84  87   Resp:      Temp:  (!) 101.2 F (38.4 C)  99.8 F (37.7 C)  TempSrc:  Oral  Oral  SpO2: 92% 93% 90%   Weight:      Height:       Eyes: lids and conjunctivae normal ENMT: Mucous membranes are moist.  Neck: normal, supple Respiratory: clear to auscultation bilaterally. Normal respiratory effort. No accessory muscle use.  Cardiovascular: Regular rate and rhythm, no murmurs. No extremity edema. Abdomen: minimal tenderness to lower quadrants, no distention. Bowel sounds positive.  Musculoskeletal:  No joint deformity upper and lower extremities.   Skin: no rashes, lesions, ulcers.  Psychiatric: Normal judgment and insight. Alert and oriented x 3. Normal mood.   Labs on Admission: I have personally reviewed following labs and imaging studies  CBC: Recent Labs  Lab 01/10/17 1309  WBC 10.4  HGB 11.0*  HCT 35.9*  MCV 90.9  PLT 277   Basic Metabolic Panel: Recent Labs  Lab 01/10/17 1309  NA 137  K 4.1  CL 99*  CO2 28  GLUCOSE 105*  BUN 17  CREATININE 0.90  CALCIUM 9.3   GFR: Estimated Creatinine Clearance: 55.2 mL/min (by C-G formula based on SCr of 0.9 mg/dL). Liver Function Tests: Recent Labs  Lab 01/10/17 1309  AST 24  ALT 21  ALKPHOS 294*  BILITOT 0.6  PROT 7.7  ALBUMIN 3.1*   Recent Labs  Lab 01/10/17 1309  LIPASE 15   No results for input(s): AMMONIA in the last 168 hours. Coagulation Profile: No results for input(s): INR, PROTIME in the last 168 hours. Cardiac Enzymes: No results for input(s): CKTOTAL, CKMB, CKMBINDEX, TROPONINI in the last 168 hours. BNP (last 3 results) No results for input(s): PROBNP in the last 8760 hours. HbA1C: No results for input(s): HGBA1C in the last 72 hours. CBG: No results for input(s): GLUCAP in  the last 168 hours. Lipid Profile: No results for input(s): CHOL, HDL, LDLCALC, TRIG, CHOLHDL, LDLDIRECT in the last 72  hours. Thyroid Function Tests: No results for input(s): TSH, T4TOTAL, FREET4, T3FREE, THYROIDAB in the last 72 hours. Anemia Panel: No results for input(s): VITAMINB12, FOLATE, FERRITIN, TIBC, IRON, RETICCTPCT in the last 72 hours. Urine analysis:    Component Value Date/Time   COLORURINE AMBER (A) 01/10/2017 1256   APPEARANCEUR HAZY (A) 01/10/2017 1256   LABSPEC 1.029 01/10/2017 1256   PHURINE 5.0 01/10/2017 1256   GLUCOSEU NEGATIVE 01/10/2017 1256   HGBUR NEGATIVE 01/10/2017 1256   BILIRUBINUR NEGATIVE 01/10/2017 1256   KETONESUR 20 (A) 01/10/2017 1256   PROTEINUR 30 (A) 01/10/2017 1256   UROBILINOGEN 0.2 07/20/2013 1740   NITRITE NEGATIVE 01/10/2017 1256   LEUKOCYTESUR SMALL (A) 01/10/2017 1256    Radiological Exams on Admission: Ct Abdomen Pelvis W Contrast  Result Date: 01/10/2017 CLINICAL DATA:  Nausea and abdominal pain for 14 days. EXAM: CT ABDOMEN AND PELVIS WITH CONTRAST TECHNIQUE: Multidetector CT imaging of the abdomen and pelvis was performed using the standard protocol following bolus administration of intravenous contrast. CONTRAST:  116m ISOVUE-300 IOPAMIDOL (ISOVUE-300) INJECTION 61% COMPARISON:  05/25/2015 FINDINGS: Lower chest: No acute abnormality. Hepatobiliary: No focal liver abnormality is seen. Status post cholecystectomy. No biliary dilatation. Pancreas: Unremarkable. No pancreatic ductal dilatation or surrounding inflammatory changes. Spleen: Normal in size without focal abnormality. Adrenals/Urinary Tract: Adrenal glands are unremarkable. Kidneys are normal, without renal calculi, focal lesion, or hydronephrosis. Bladder is unremarkable. Stomach/Bowel: Normal appearance of the stomach and small bowel. Relatively symmetric mucosal thickening of the sigmoid colon, image 68/87, sequence 2, with pericolonic inflammatory changes and small amount of free fluid in the left pericolic gutter. Rim enhancing localized fluid collection just posterior to the abnormal portion  of the sigmoid colon measures 2.6 x 2.3 x 2.7 cm. Background of diffuse diverticulosis. Vascular/Lymphatic: Aortic atherosclerosis. No enlarged abdominal or pelvic lymph nodes. Reproductive: Uterus and bilateral adnexa are unremarkable. Other: Small fat containing periumbilical anterior abdominal wall hernia. No abdominopelvic ascites. Musculoskeletal: Osteoarthritic changes of the lumbosacral spine. IMPRESSION: Rib collectively symmetric circumferential mucosal thickening of the sigmoid colon, with pericolonic inflammatory changes, small amount of free fluid on the background of diffuse left colonic diverticulosis. This may represent acute diverticulitis, however underlying malignancy cannot be excluded. 2.7 cm rim enhancing fluid collection just posterior to the abnormal portion of the sigmoid colon may represent an abscess formation. Correlation to colonoscopy results, after resolution of the acute symptoms is recommended to exclude underlying malignancy. Electronically Signed   By: DFidela SalisburyM.D.   On: 01/10/2017 17:03    EKG: Independently reviewed.   Assessment/Plan Principal Problem:   Diverticulitis Active Problems:   Benign essential HTN   Urinary retention   Anemia   Constipation   Bipolar I disorder, [place]    Acute diverticulitis Placed on IV Zosyn for empiric treatment Stool cultures GI consultation to Dr. RLaural Goldenin AM NPO except medications IVF with LR Morphine prn abdominal pain Zofran prn nausea Monitor alk phos with CMP in AM  Constipation -No bowel movements noted over the last 4 days Will try suppository as MiraLAX has not worked  Hypertension Continue home medications and monitor  Anemia No signs of overt bleeding Monitor CBC in a.m.  Bipolar I Disorder Continue home medications   DVT prophylaxis: Lovenox Code Status: Full Family Communication: Husband at bedside Disposition Plan:Home in 1-2 days Consults  called:GI-Dr. RLaural GoldenAdmission status:  Inpatient   Terran Klinke Darleen Crocker DO [facility] Pager [phone]  If 7PM-7AM, please contact night-coverage [email or web address] Password [email or web address]  01/10/2017, 7:01 PM

## 2017-01-10 NOTE — Plan of Care (Signed)
Pt arrived to unit alert and oriented x4. No complaint of pain or distress at this time. 20G to left hand LR at 75. Able to make needs known. Able to make needs known. Call light within reach. Bed in lowest position. Will continue to monitor.

## 2017-01-10 NOTE — Telephone Encounter (Signed)
Patient's husband states that the patient has been sick for 14 days straight. Symptoms are: Pain in stomach , Nausea , Chills , fever 100.3 and she has not been able to eat anything. The abdomen is painful all over with tenderness over where her pancreas is per her husband. Discussed with Dr.Rehman. Due to the patient 's history and her having fever ,and if she is in agreement , she should come to ED for evaluation. If she does not want to come to the ED , we will get CBC, Amylase ,Lipase and Chemistry Panel.  Talked with the patient's husband and he states as soon as the patient gets dressed [facility].Rehman was made aware.

## 2017-01-10 NOTE — ED Provider Notes (Signed)
[facility] EMERGENCY DEPARTMENT Provider Note   CSN: [id number] Arrival date & time: 01/10/17  1133     History   Chief Complaint Chief Complaint  Patient presents with  . Abdominal Pain    HPI Lindsay Palmer is a 76 y.o. female.  Pt presents to the ED today with abdominal pain for 2 weeks.  The pt said it feels like her prior attacks of pancreatitis.  She said her lipase usually does not elevate.  The pt has not had any fevers.  No vomiting, but nausea.        Past Medical History:  Diagnosis Date  . Bipolar 2 disorder (HCC)   . Chronic insomnia   . Diverticulosis of colon   . Elevated LFTs   . Fatty liver disease, nonalcoholic   . GERD (gastroesophageal reflux disease)   . H/O first degree atrioventricular block   . Hypercholesteremia   . Hypertension   . Major depressive disorder   . Migraine   . Osteopenia   . Pancreatitis   . Sleep apnea    Stop Bang score of 4. Pt said she had sleep apnea at one time, but she has had another sleep study since then and they said she does not have it anymore.  . Vitamin D deficiency     Patient Active Problem List   Diagnosis Date Noted  . Other dysphagia 08/10/2016  . Bipolar I disorder, most recent episode depressed (HCC) 07/29/2014  . Unspecified constipation 09/05/2013  . Pancreatic pseudocyst 08/21/2013  . Anemia 07/23/2013  . Drug-induced pancreatitis DUE TO CONTRAST 07/19/2013  . Benign essential HTN 07/19/2013  . Urinary retention 07/19/2013  . Acute pancreatitis 07/19/2013  . Abdominal pain 07/10/2013    Past Surgical History:  Procedure Laterality Date  . BILIARY STENT PLACEMENT N/A 07/18/2013   Procedure: BILIARY STENT PLACEMENT;  Surgeon: Malissa Hippo, MD;  Location: [facility];  Service: Endoscopy;  Laterality: N/A;  . Cataract Surgery Bilateral 08-2012  . CHOLECYSTECTOMY    . COLONOSCOPY  2003   Diverticulitis h/o polyps -due next 2015 -gastroscopy 2003  . ERCP    . ERCP N/A 07/18/2013   Procedure:  ENDOSCOPIC RETROGRADE CHOLANGIOPANCREATOGRAPHY (ERCP) COMMON BILE DUCT BRUSHING;  Surgeon: Malissa Hippo, MD;  Location: [facility];  Service: Endoscopy;  Laterality: N/A;  . ERCP N/A 11/07/2013   Procedure: ENDOSCOPIC RETROGRADE CHOLANGIOPANCREATOGRAPHY (ERCP);  Surgeon: Malissa Hippo, MD;  Location: [facility];  Service: Endoscopy;  Laterality: N/A;  . ESOPHAGEAL DILATION N/A 08/23/2016   Procedure: ESOPHAGEAL DILATION;  Surgeon: Malissa Hippo, MD;  Location: [facility];  Service: Endoscopy;  Laterality: N/A;  . ESOPHAGOGASTRODUODENOSCOPY N/A 08/23/2016   Procedure: ESOPHAGOGASTRODUODENOSCOPY (EGD);  Surgeon: Malissa Hippo, MD;  Location: [facility];  Service: Endoscopy;  Laterality: N/A;  245-moved up to 100 per [name]  . EYE SURGERY Bilateral    cataract removal  . NECK SURGERY     Stenosis C6-C7  . SIGMOIDOSCOPY  2007   no polyps  . SPHINCTEROTOMY N/A 07/18/2013   Procedure: SPHINCTEROTOMY;  Surgeon: Malissa Hippo, MD;  Location: [facility];  Service: Endoscopy;  Laterality: N/A;  . SPHINCTEROTOMY  11/07/2013   Procedure: EXTENSION OF SPHINCTEROTOMY;  Surgeon: Malissa Hippo, MD;  Location: [facility];  Service: Endoscopy;;  . STENT REMOVAL N/A 11/07/2013   Procedure: STENT REMOVAL;  Surgeon: Malissa Hippo, MD;  Location: [facility];  Service: Endoscopy;  Laterality: N/A;    OB History  No data available       Home Medications    Prior to Admission medications   Medication Sig Start Date End Date Taking? Authorizing Provider  acetaminophen (TYLENOL) 500 MG tablet Take 500 mg by mouth daily as needed for moderate pain or headache.    [provider]  amLODipine (NORVASC) 10 MG tablet Take 15 mg by mouth daily.    [provider]  ARIPiprazole (ABILIFY) 10 MG tablet Take 1 tablet (10 mg total) by mouth at bedtime. 11/06/16   Myrlene Broker, MD  Cholecalciferol (VITAMIN D) 400 UNITS capsule Take 400 Units by mouth daily.    [provider]  dicyclomine  (BENTYL) 10 MG capsule Take 1 capsule (10 mg total) by mouth 2 (two) times daily as needed for spasms. before breakfast and lunch. 11/07/16   Rehman, Joline Maxcy, MD  Fish Oil-Cholecalciferol (FISH OIL + D3 PO) Take 1 capsule by mouth 2 (two) times daily.     [provider]  HYDROcodone-acetaminophen (NORCO/VICODIN) 5-325 MG tablet Take 1 tablet by mouth 2 (two) times daily as needed for moderate pain. 08/08/16   Malissa Hippo, MD  lamoTRIgine (LAMICTAL) 200 MG tablet Take 1 tablet (200 mg total) by mouth daily. 11/06/16   Myrlene Broker, MD  losartan (COZAAR) 100 MG tablet Take 100 mg by mouth daily.    [provider]  meloxicam (MOBIC) 15 MG tablet Take 15 mg by mouth daily with supper.  06/29/13   [provider]  metoprolol tartrate (LOPRESSOR) 25 MG tablet Take 12.5 mg by mouth 2 (two) times daily.     [provider]  omeprazole (PRILOSEC) 20 MG capsule Take 1 capsule (20 mg total) by mouth 2 (two) times daily before a meal. 06/01/16   Setzer, Terri L, NP  ondansetron (ZOFRAN) 4 MG tablet TAKE 1 TABLET BY MOUTH EVERY 8 HOURS AS NEEDED FOR NAUSEA OR VOMITING 09/15/15   Setzer, Brand Males, NP  OVER THE COUNTER MEDICATION Fiber well - 5 grams 1 chewable tablet twice daily.    [provider]  polyethylene glycol powder (GLYCOLAX/MIRALAX) powder TAKE 8.5 GRAMS(1/2 CAPFUL) MIXED IN FLUID AND DRANK EVERY OTHER DAY Patient taking differently: TAKE 17 G MIXED IN FLUID AND DRINK AS NEEDED FOR CONSTIPATION 05/01/16   Setzer, Terri L, NP  potassium chloride (K-DUR) 10 MEQ tablet Take 2 tablets (20 mEq total) by mouth as needed. Patient is to take this on the days she takes the Demadex. 05/04/16   Rehman, Joline Maxcy, MD  simvastatin (ZOCOR) 20 MG tablet Take 20 mg by mouth every evening.     [provider]  torsemide (DEMADEX) 20 MG tablet TAKE 1 TABLET BY MOUTH DAILY AS NEEDED FOR SWELLING 11/03/16   Setzer, Brand Males, NP  vitamin B-12 (CYANOCOBALAMIN) 1000 MCG tablet  Take 1,000 mcg by mouth daily.    [provider]  zolpidem (AMBIEN) 10 MG tablet Take 1 tablet (10 mg total) by mouth at bedtime. 11/06/16   Myrlene Broker, MD    Family History Family History  Problem Relation Age of Onset  . Diabetes Mother   . Coronary artery disease Father   . Gout Father   . Breast cancer Sister   . Pancreatic cancer Brother   . Alcohol abuse Brother   . Healthy Brother   . Lung cancer Sister   . Healthy Sister   . Depression Sister   . Hyperlipidemia Daughter   . Hyperlipidemia Son   .  Bipolar disorder Son   . Healthy Sister   . Healthy Sister   . Seizures Other     Social History Social History   Tobacco Use  . Smoking status: Never Smoker  . Smokeless tobacco: Never Used  Substance Use Topics  . Alcohol use: No    Alcohol/week: 0.0 oz  . Drug use: No     Allergies   Codeine and Oxycodone   Review of Systems Review of Systems  Gastrointestinal: Positive for abdominal pain and nausea.  All other systems reviewed and are negative.    Physical Exam Updated Vital Signs BP (!) 146/57   Pulse 84   Temp (!) 101.2 F (38.4 C) (Oral)   Resp 18   Ht 5\' 4"  (1.626 m)   Wt 79.8 kg (176 lb)   SpO2 93%   BMI 30.21 kg/m   Physical Exam  Constitutional: She is oriented to person, place, and time. She appears well-developed and well-nourished.  HENT:  Head: Normocephalic and atraumatic.  Mouth/Throat: Oropharynx is clear and moist.  Eyes: EOM are normal. Pupils are equal, round, and reactive to light.  Cardiovascular: Normal rate, regular rhythm and normal heart sounds.  Pulmonary/Chest: Effort normal and breath sounds normal.  Abdominal: Soft. Normal appearance and bowel sounds are normal. There is tenderness in the epigastric area and left lower quadrant.  Neurological: She is alert and oriented to person, place, and time.  Skin: Skin is warm. Capillary refill takes less than 2 seconds.  Psychiatric: She has a normal mood and  affect. Her behavior is normal.  Nursing note and vitals reviewed.    ED Treatments / Results  Labs (all labs ordered are listed, but only abnormal results are displayed) Labs Reviewed  COMPREHENSIVE METABOLIC PANEL - Abnormal; Notable for the following components:      Result Value   Chloride 99 (*)    Glucose, Bld 105 (*)    Albumin 3.1 (*)    Alkaline Phosphatase 294 (*)    All other components within normal limits  CBC - Abnormal; Notable for the following components:   Hemoglobin 11.0 (*)    HCT 35.9 (*)    All other components within normal limits  URINALYSIS, ROUTINE W REFLEX MICROSCOPIC - Abnormal; Notable for the following components:   Color, Urine AMBER (*)    APPearance HAZY (*)    Ketones, ur 20 (*)    Protein, ur 30 (*)    Leukocytes, UA SMALL (*)    Bacteria, UA RARE (*)    Squamous Epithelial / LPF 0-5 (*)    All other components within normal limits  LIPASE, BLOOD    EKG  EKG Interpretation None       Radiology Ct Abdomen Pelvis W Contrast  Result Date: 01/10/2017 CLINICAL DATA:  Nausea and abdominal pain for 14 days. EXAM: CT ABDOMEN AND PELVIS WITH CONTRAST TECHNIQUE: Multidetector CT imaging of the abdomen and pelvis was performed using the standard protocol following bolus administration of intravenous contrast. CONTRAST:  100mL ISOVUE-300 IOPAMIDOL (ISOVUE-300) INJECTION 61% COMPARISON:  05/25/2015 FINDINGS: Lower chest: No acute abnormality. Hepatobiliary: No focal liver abnormality is seen. Status post cholecystectomy. No biliary dilatation. Pancreas: Unremarkable. No pancreatic ductal dilatation or surrounding inflammatory changes. Spleen: Normal in size without focal abnormality. Adrenals/Urinary Tract: Adrenal glands are unremarkable. Kidneys are normal, without renal calculi, focal lesion, or hydronephrosis. Bladder is unremarkable. Stomach/Bowel: Normal appearance of the stomach and small bowel. Relatively symmetric mucosal thickening of the  sigmoid colon, image  68/87, sequence 2, with pericolonic inflammatory changes and small amount of free fluid in the left pericolic gutter. Rim enhancing localized fluid collection just posterior to the abnormal portion of the sigmoid colon measures 2.6 x 2.3 x 2.7 cm. Background of diffuse diverticulosis. Vascular/Lymphatic: Aortic atherosclerosis. No enlarged abdominal or pelvic lymph nodes. Reproductive: Uterus and bilateral adnexa are unremarkable. Other: Small fat containing periumbilical anterior abdominal wall hernia. No abdominopelvic ascites. Musculoskeletal: Osteoarthritic changes of the lumbosacral spine. IMPRESSION: Rib collectively symmetric circumferential mucosal thickening of the sigmoid colon, with pericolonic inflammatory changes, small amount of free fluid on the background of diffuse left colonic diverticulosis. This may represent acute diverticulitis, however underlying malignancy cannot be excluded. 2.7 cm rim enhancing fluid collection just posterior to the abnormal portion of the sigmoid colon may represent an abscess formation. Correlation to colonoscopy results, after resolution of the acute symptoms is recommended to exclude underlying malignancy. Electronically Signed   By: Ted Mcalpineobrinka  Dimitrova M.D.   On: 01/10/2017 17:03    Procedures Procedures (including critical care time)  Medications Ordered in ED Medications  cefTRIAXone (ROCEPHIN) 2 g in dextrose 5 % 50 mL IVPB (2 g Intravenous New Bag/Given 01/10/17 1730)    And  metroNIDAZOLE (FLAGYL) IVPB 500 mg (not administered)  acetaminophen (TYLENOL) tablet 1,000 mg (not administered)  morphine 4 MG/ML injection 4 mg (not administered)  ondansetron (ZOFRAN) injection 4 mg (4 mg Intravenous Given 01/10/17 1623)  sodium chloride 0.9 % bolus 1,000 mL (1,000 mLs Intravenous New Bag/Given 01/10/17 1623)  morphine 4 MG/ML injection 4 mg (4 mg Intravenous Given 01/10/17 1623)  iopamidol (ISOVUE-300) 61 % injection 100 mL (100 mLs  Intravenous Contrast Given 01/10/17 1641)     Initial Impression / Assessment and Plan / ED Course  I have reviewed the triage vital signs and the nursing notes.  Pertinent labs & imaging results that were available during my care of the patient were reviewed by me and considered in my medical decision making (see chart for details).   Ct scan results shared with patient.  Sx more c/w acute diverticulitis as she is febrile and tender, but she is aware that she will need a colonoscopy after she is feeling better.    Pt d/w Dr. Sherryll BurgerShah ([facility]) who will admit.   Final Clinical Impressions(s) / ED Diagnoses   Final diagnoses:  Colonic diverticular abscess  Dehydration    ED Discharge Orders    None       Jacalyn LefevreHaviland, Daysean Tinkham, MD 01/10/17 [phone]

## 2017-01-11 ENCOUNTER — Other Ambulatory Visit: Payer: Self-pay

## 2017-01-11 DIAGNOSIS — K5900 Constipation, unspecified: Secondary | ICD-10-CM

## 2017-01-11 DIAGNOSIS — E860 Dehydration: Secondary | ICD-10-CM

## 2017-01-11 DIAGNOSIS — K5792 Diverticulitis of intestine, part unspecified, without perforation or abscess without bleeding: Secondary | ICD-10-CM

## 2017-01-11 DIAGNOSIS — D649 Anemia, unspecified: Secondary | ICD-10-CM

## 2017-01-11 DIAGNOSIS — F3130 Bipolar disorder, current episode depressed, mild or moderate severity, unspecified: Secondary | ICD-10-CM

## 2017-01-11 DIAGNOSIS — K5720 Diverticulitis of large intestine with perforation and abscess without bleeding: Principal | ICD-10-CM

## 2017-01-11 LAB — COMPREHENSIVE METABOLIC PANEL
ALK PHOS: 333 U/L — AB (ref 38–126)
ALT: 21 U/L (ref 14–54)
AST: 22 U/L (ref 15–41)
Albumin: 2.5 g/dL — ABNORMAL LOW (ref 3.5–5.0)
Anion gap: 12 (ref 5–15)
BUN: 17 mg/dL (ref 6–20)
CHLORIDE: 102 mmol/L (ref 101–111)
CO2: 23 mmol/L (ref 22–32)
Calcium: 8.6 mg/dL — ABNORMAL LOW (ref 8.9–10.3)
Creatinine, Ser: 0.79 mg/dL (ref 0.44–1.00)
GFR calc Af Amer: >60 mL/min (ref >60)
GLUCOSE: 102 mg/dL — AB (ref 65–99)
POTASSIUM: 3.6 mmol/L (ref 3.5–5.1)
Sodium: 137 mmol/L (ref 135–145)
Total Bilirubin: 0.8 mg/dL (ref 0.3–1.2)
Total Protein: 6.5 g/dL (ref 6.5–8.1)

## 2017-01-11 LAB — CBC
HCT: 31.6 % — ABNORMAL LOW (ref 36.0–46.0)
HEMOGLOBIN: 9.7 g/dL — AB (ref 12.0–15.0)
MCH: 27.8 pg (ref 26.0–34.0)
MCHC: 30.7 g/dL (ref 30.0–36.0)
MCV: 90.5 fL (ref 78.0–100.0)
Platelets: 286 10*3/uL (ref 150–400)
RBC: 3.49 MIL/uL — AB (ref 3.87–5.11)
RDW: 14.4 % (ref 11.5–15.5)
WBC: 11.2 10*3/uL — ABNORMAL HIGH (ref 4.0–10.5)

## 2017-01-11 LAB — GLUCOSE, CAPILLARY: Glucose-Capillary: 88 mg/dL (ref 65–99)

## 2017-01-11 MED ORDER — POLYETHYLENE GLYCOL 3350 17 G PO PACK: 8.5000 g | PACK | Freq: Every evening | ORAL | Status: DC | PRN

## 2017-01-11 NOTE — Progress Notes (Addendum)
Pt has not had any BMs last night, unsure of if or when we will get a stool sample. Patient is on enteric precautions per MD order.   Also patient told me that she has been constipated for 4 days.

## 2017-01-11 NOTE — Progress Notes (Signed)
PROGRESS NOTE    Lindsay LinerHelen Kritikos  WUX:[id number] DOB: 11-30-1940 DOA: 01/10/2017 PCP: Garald BraverElliott, Dianne E    Brief Narrative:  Lindsay Palmer is a 76 y.o. female with medical history significant for diverticulosis, nonalcoholic fatty liver disease, prior pancreatitis with biliary stricture, and prior cholecystectomy who presents to the emergency department at the urging of her gastroenterologist Dr. Karilyn Cotaehman on account of the periumbilical as well as lower abdominal pain that has been persistent for the last 2 weeks.  She typically has periods of intermittent abdominal pain that is similar nature and she states that this is been ongoing for quite some time even after an ERCP that she had 2 years ago.  She has had quite a poor appetite and has been persistently nauseated, and therefore she has not been eating much at all.  She denies any actual vomiting or diarrhea.  She has been able to take her usual medications as prescribed.   ED Course: Her vital signs are noted to be stable but she is febrile with a temperature of 101.2 Fahrenheit.  Her labs do not demonstrate any significant abnormalities aside from some mild anemia and alkaline phosphatase elevation (this appears to be persistent per conversation with Dr. Karilyn Cotaehman).  CT of her abdomen and pelvis with contrast demonstrates diverticulosis with pericolonic inflammation and a small amount of free fluid likely suggestive of diverticulitis.  She has been given some Rocephin as well as Flagyl for empiric treatment as well as some IV fluid.  She states that she received some morphine which gave her relief of her pain.     Assessment & Plan:   Principal Problem:   Diverticulitis Active Problems:   Benign essential HTN   Urinary retention   Anemia   Constipation   Bipolar I disorder, most recent episode depressed (HCC)  Acute diverticulitis Placed on IV Zosyn for empiric treatment Stool cultures Dr. Karilyn Cotaehman consulted NPO except medications IVF with  LR Morphine prn abdominal pain Zofran prn nausea Patient reports improved pain management with morphine  Constipation -Patient voiced that she still has not had a bowel movement  Hypertension Continue home medications and monitor  Anemia No signs of overt bleeding H&H decreased this a.m. possibly due to dilution Repeat CBC in a.m.  Bipolar I Disorder Continue home medications  Left shoulder pain PT eval and treat   DVT prophylaxis: Lovenox Code Status: Full code Family Communication: Husband and son are bedside Disposition Plan: Pending improvement   Consultants:   GI with Dr. Karilyn Cotaehman  Procedures:   None  Antimicrobials:   Zosyn   Subjective: Patient seen and examined.  She has numerous questions about what diverticulitis is.  She voices that her pain is well controlled on morphine.  Objective: Vitals:   01/10/17 2000 01/10/17 2049 01/11/17 0400 01/11/17 1409  BP: (!) 155/45  (!) 130/54 (!) 131/45  Pulse: 86  92 86  Resp: 20  18 18   Temp: 98.6 F (37 C)  99.6 F (37.6 C) 99.9 F (37.7 C)  TempSrc: Oral  Oral   SpO2: 90% 91% 96% 91%  Weight: 80.7 kg (178 lb)     Height: 5\' 4"  (1.626 m)       Intake/Output Summary (Last 24 hours) at 01/11/2017 1457 Last data filed at 01/11/2017 0801 Gross per 24 hour  Intake 1250 ml  Output -  Net 1250 ml   Filed Weights   01/10/17 1247 01/10/17 2000  Weight: 79.8 kg (176 lb) 80.7 kg (178 lb)  Examination:  General exam: Appears calm and comfortable  Respiratory system: Clear to auscultation. Respiratory effort normal. Cardiovascular system: S1 & S2 heard, RRR. No JVD, murmurs, rubs, gallops or clicks. No pedal edema. Gastrointestinal system: Abdomen is nondistended, soft and diffusely tender on deep palpation. No organomegaly or masses felt. Normal bowel sounds heard. Central nervous system: Alert and oriented. No focal neurological deficits. Extremities: Symmetric 5 x 5 power.  Pain in left  shoulder Skin: No rashes, lesions or ulcers Psychiatry: Judgement and insight appear normal. Mood & affect appropriate.     Data Reviewed: I have personally reviewed following labs and imaging studies  CBC: Recent Labs  Lab 01/10/17 1309 01/11/17 0610  WBC 10.4 11.2*  HGB 11.0* 9.7*  HCT 35.9* 31.6*  MCV 90.9 90.5  PLT 316 286   Basic Metabolic Panel: Recent Labs  Lab 01/10/17 1309 01/11/17 0610  NA 137 137  K 4.1 3.6  CL 99* 102  CO2 28 23  GLUCOSE 105* 102*  BUN 17 17  CREATININE 0.90 0.79  CALCIUM 9.3 8.6*   GFR: Estimated Creatinine Clearance: 62.4 mL/min (by C-G formula based on SCr of 0.79 mg/dL). Liver Function Tests: Recent Labs  Lab 01/10/17 1309 01/11/17 0610  AST 24 22  ALT 21 21  ALKPHOS 294* 333*  BILITOT 0.6 0.8  PROT 7.7 6.5  ALBUMIN 3.1* 2.5*   Recent Labs  Lab 01/10/17 1309  LIPASE 15   No results for input(s): AMMONIA in the last 168 hours. Coagulation Profile: No results for input(s): INR, PROTIME in the last 168 hours. Cardiac Enzymes: No results for input(s): CKTOTAL, CKMB, CKMBINDEX, TROPONINI in the last 168 hours. BNP (last 3 results) No results for input(s): PROBNP in the last 8760 hours. HbA1C: No results for input(s): HGBA1C in the last 72 hours. CBG: Recent Labs  Lab 01/11/17 0753  GLUCAP 88   Lipid Profile: No results for input(s): CHOL, HDL, LDLCALC, TRIG, CHOLHDL, LDLDIRECT in the last 72 hours. Thyroid Function Tests: No results for input(s): TSH, T4TOTAL, FREET4, T3FREE, THYROIDAB in the last 72 hours. Anemia Panel: No results for input(s): VITAMINB12, FOLATE, FERRITIN, TIBC, IRON, RETICCTPCT in the last 72 hours. Sepsis Labs: No results for input(s): PROCALCITON, LATICACIDVEN in the last 168 hours.  No results found for this or any previous visit (from the past 240 hour(s)).       Radiology Studies: Ct Abdomen Pelvis W Contrast  Result Date: 01/10/2017 CLINICAL DATA:  Nausea and abdominal pain for  14 days. EXAM: CT ABDOMEN AND PELVIS WITH CONTRAST TECHNIQUE: Multidetector CT imaging of the abdomen and pelvis was performed using the standard protocol following bolus administration of intravenous contrast. CONTRAST:  ISOVUE-300 IOPAMIDOL (ISOVUE-300) INJECTION 61% COMPARISON:  05/25/2015 FINDINGS: Lower chest: No acute abnormality. Hepatobiliary: No focal liver abnormality is seen. Status post cholecystectomy. No biliary dilatation. Pancreas: Unremarkable. No pancreatic ductal dilatation or surrounding inflammatory changes. Spleen: Normal in size without focal abnormality. Adrenals/Urinary Tract: Adrenal glands are unremarkable. Kidneys are normal, without renal calculi, focal lesion, or hydronephrosis. Bladder is unremarkable. Stomach/Bowel: Normal appearance of the stomach and small bowel. Relatively symmetric mucosal thickening of the sigmoid colon, image 68/87, sequence 2, with pericolonic inflammatory changes and small amount of free fluid in the left pericolic gutter. Rim enhancing localized fluid collection just posterior to the abnormal portion of the sigmoid colon measures 2.6 x 2.3 x 2.7 cm. Background of diffuse diverticulosis. Vascular/Lymphatic: Aortic atherosclerosis. No enlarged abdominal or pelvic lymph nodes. Reproductive: Uterus and bilateral  adnexa are unremarkable. Other: Small fat containing periumbilical anterior abdominal wall hernia. No abdominopelvic ascites. Musculoskeletal: Osteoarthritic changes of the lumbosacral spine. IMPRESSION: Rib collectively symmetric circumferential mucosal thickening of the sigmoid colon, with pericolonic inflammatory changes, small amount of free fluid on the background of diffuse left colonic diverticulosis. This may represent acute diverticulitis, however underlying malignancy cannot be excluded. 2.7 cm rim enhancing fluid collection just posterior to the abnormal portion of the sigmoid colon may represent an abscess formation. Correlation to  colonoscopy results, after resolution of the acute symptoms is recommended to exclude underlying malignancy. Electronically Signed   By: Ted Mcalpineobrinka  Dimitrova M.D.   On: 01/10/2017 17:03        Scheduled Meds: . amLODipine  15 mg Oral Daily  . ARIPiprazole  10 mg Oral QHS  . cholecalciferol  400 Units Oral Daily  . enoxaparin (LOVENOX) injection  40 mg Subcutaneous Q24H  . lamoTRIgine  200 mg Oral Daily  . losartan  100 mg Oral Daily  . metoprolol tartrate  12.5 mg Oral BID  . pantoprazole  40 mg Oral Daily  . zolpidem  5 mg Oral QHS   Continuous Infusions: . piperacillin-tazobactam 3.375 g (01/11/17 1259)     LOS: 1 day    Time spent: 35 minutes    Katrinka BlazingAlex U Kadolph, MD [facility] Pager [phone]  If 7PM-7AM, please contact night-coverage [email or web address] Password [email or web address] 01/11/2017, 2:57 PM

## 2017-01-11 NOTE — Progress Notes (Signed)
Pt complained of pain in left shoulder that was not relieved by morphine. Spoke with MD and was told to give another dose of morphine 1mg  and to give tylenol.

## 2017-01-11 NOTE — Care Management Note (Signed)
Case Management Note  Patient Details  Name: Lindsay Palmer MRN: [id number] Date of Birth: 07/27/1940  Subjective/Objective:                 Admitted with diverticulitis. Chart reviewed for CM needs. Pt is from home, lives with spouse, has PCP, insurance with drug coverage and transportation. No CM needs noted at this time.    Action/Plan: CM may be consulted if needs arise. Anticipate DC home with self care.   Expected Discharge Date:      01/12/2017            Expected Discharge Plan:  Home/Self Care  In-House Referral:  NA  Discharge planning Services  NA  Post Acute Care Choice:  NA Choice offered to:  NA  Status of Service:  Completed, signed off  If discussed at Long Length of Stay Meetings, dates discussed:    Additional Comments:  Malcolm MetroChildress, Kemia Wendel Demske, RN 01/11/2017, 2:55 PM

## 2017-01-11 NOTE — Consult Note (Signed)
Referring Provider: Ashley Royalty, MD Primary Care Physician:  Garald Braver Primary Gastroenterologist:  Dr. Karilyn Cota  Reason for Consultation:    Sigmoid diverticulitis.  HPI:   Patient is 76 year old Caucasian female whose husband contacted me yesterday that his wife had been sick for 2 weeks.  She had been experiencing mid and lower abdominal pain as well as nausea and was unable to eat.  She also had been running low-grade fever over the last 3-4 days.  Patient was advised to come to emergency room.  She was evaluated by Dr. Samuel Jester and on workup found to have sigmoid diverticulitis with focal abnormalities suspicious for an abscess.  Patient was hospitalized for IV antibiotics and pain management.  Patient was begun on IV fluids and IV Zosyn and kept n.p.o. except for p.o. medications.   She feels better.  She was nauseated this morning but not anymore.  She does not have an appetite but would not mind having some food.  She states most of her pain is located in left lower quadrant of her abdomen.  She went few days without a bowel movement but finally did have one this afternoon and she passed formed stool.  She says she has never been treated for diverticulitis.  She has history of colonic diverticulosis but has never been treated for diverticulitis. She also complains of left shoulder pain which she has had for a while. She denies heartburn vomiting melena rectal bleeding dysuria or hematuria.  She feels she may have lost few pounds since she has been sick.  Patient's husband wanted her to be evaluated last week but she decided not to until yesterday.  Patient is retired.  She and her husband live in [place].  They have 2 grownup children.  She does not smoke cigarettes or drink alcohol.    Past Medical History:  Diagnosis Date  . Bipolar 2 disorder (HCC)   . Chronic insomnia   . Diverticulosis of colon   .  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   . Fatty liver disease, nonalcoholic   . GERD (gastroesophageal reflux disease)   . H/O first degree atrioventricular block   . Hypercholesteremia   . Hypertension   . Major depressive disorder   . Migraine   . Osteopenia   .  Irritable bowel syndrome with constipation.   . Sleep apnea    Stop Bang score of 4. Pt said she had sleep apnea at one time, but she has had another sleep study since then and they said she does not have it anymore.  . Vitamin D deficiency        She has a history of colonic polyps. She had colonoscopy in March 2000 with removal of small polyp but it showed inflammatory changes. She had colonoscopy in October 2003 when she was noted to have sigmoid diverticulosis.  Diverticulosis. She possibly had another colonoscopy in January 2007.  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   Past Surgical History:  Procedure Laterality Date  . BILIARY STENT PLACEMENT N/A 07/18/2013   Procedure: BILIARY STENT PLACEMENT;  Surgeon: Malissa Hippo, MD;  Location: [facility];  Service: Endoscopy;  Laterality: N/A;  . Cataract Surgery Bilateral 08-2012  . CHOLECYSTECTOMY    . COLONOSCOPY  2003   Diverticulitis h/o polyps -due next 2015 -gastroscopy 2003  . ERCP    . ERCP N/A  07/18/2013   Procedure: ENDOSCOPIC RETROGRADE CHOLANGIOPANCREATOGRAPHY (ERCP) COMMON BILE DUCT BRUSHING;  Surgeon: Malissa HippoNajeeb U Lovelee Forner, MD;  Location: [facility];  Service: Endoscopy;  Laterality: N/A;  . ERCP N/A 11/07/2013   Procedure: ENDOSCOPIC RETROGRADE CHOLANGIOPANCREATOGRAPHY (ERCP);  Surgeon: Malissa HippoNajeeb U Franklin Baumbach, MD;  Location: [facility];  Service: Endoscopy;  Laterality: N/A;  . ESOPHAGEAL DILATION N/A 08/23/2016   Procedure: ESOPHAGEAL DILATION;  Surgeon: Malissa Hippoehman, Finlay Mills U, MD;  Location: [facility];  Service: Endoscopy;  Laterality: N/A;  . ESOPHAGOGASTRODUODENOSCOPY N/A 08/23/2016   Procedure:  ESOPHAGOGASTRODUODENOSCOPY (EGD);  Surgeon: Malissa Hippoehman, Juwan Vences U, MD;  Location: [facility];  Service: Endoscopy;  Laterality: N/A;  245-moved up to 100 per Dewayne HatchAnn  . EYE SURGERY Bilateral    cataract removal  . NECK SURGERY     Stenosis C6-C7  . SIGMOIDOSCOPY  2007   no polyps  . SPHINCTEROTOMY N/A 07/18/2013   Procedure: SPHINCTEROTOMY;  Surgeon: Malissa HippoNajeeb U Brailee Riede, MD;  Location: [facility];  Service: Endoscopy;  Laterality: N/A;  . SPHINCTEROTOMY  11/07/2013   Procedure: EXTENSION OF SPHINCTEROTOMY;  Surgeon: Malissa HippoNajeeb U Brookley Spitler, MD;  Location: [facility];  Service: Endoscopy;;  . STENT REMOVAL N/A 11/07/2013   Procedure: STENT REMOVAL;  Surgeon: Malissa HippoNajeeb U Latashia Koch, MD;  Location: [facility];  Service: Endoscopy;  Laterality: N/A;    Prior to Admission medications   Medication Sig Start Date End Date Taking? Authorizing Provider  acetaminophen (TYLENOL) 500 MG tablet Take 500 mg by mouth daily as needed for moderate pain or headache.   Yes [provider]  amLODipine (NORVASC) 5 MG tablet Take 7.5 mg by mouth daily.    Yes [provider]  ARIPiprazole (ABILIFY) 10 MG tablet Take 1 tablet (10 mg total) by mouth at bedtime. 11/06/16  Yes Myrlene Brokeross, Deborah R, MD  Cholecalciferol (VITAMIN D) 400 UNITS capsule Take 400 Units by mouth daily.   Yes [provider]  dicyclomine (BENTYL) 10 MG capsule Take 1 capsule (10 mg total) by mouth 2 (two) times daily as needed for spasms. before breakfast and lunch. 11/07/16  Yes Victoria Euceda, Joline MaxcyNajeeb U, MD  Fish Oil-Cholecalciferol (FISH OIL + D3 PO) Take 1 capsule by mouth 2 (two) times daily.    Yes [provider]  lamoTRIgine (LAMICTAL) 200 MG tablet Take 1 tablet (200 mg total) by mouth daily. 11/06/16  Yes Myrlene Brokeross, Deborah R, MD  losartan (COZAAR) 100 MG tablet Take 100 mg by mouth daily.   Yes [provider]  meloxicam (MOBIC) 15 MG tablet Take 15 mg by mouth daily with supper.  06/29/13  Yes [provider]  metoprolol tartrate  (LOPRESSOR) 25 MG tablet Take 12.5 mg by mouth 2 (two) times daily.    Yes [provider]  omeprazole (PRILOSEC) 20 MG capsule Take 1 capsule (20 mg total) by mouth 2 (two) times daily before a meal. 06/01/16  Yes Setzer, Terri L, NP  ondansetron (ZOFRAN) 4 MG tablet TAKE 1 TABLET BY MOUTH EVERY 8 HOURS AS NEEDED FOR NAUSEA OR VOMITING 09/15/15  Yes Setzer, Terri L, NP  OVER THE COUNTER MEDICATION Fiber well - 5 grams 1 chewable tablet twice daily.   Yes [provider]  polyethylene glycol powder (GLYCOLAX/MIRALAX) powder TAKE 8.5 GRAMS(1/2 CAPFUL) MIXED IN FLUID AND DRANK EVERY OTHER DAY Patient taking differently: TAKE 17 G MIXED IN FLUID AND DRINK AS NEEDED FOR CONSTIPATION 05/01/16  Yes Setzer, Terri L, NP  potassium chloride (K-DUR) 10 MEQ tablet Take 2 tablets (20 mEq total) by  mouth as needed. Patient is to take this on the days she takes the Demadex. 05/04/16  Yes Jadarius Commons, Joline Maxcy, MD  simvastatin (ZOCOR) 20 MG tablet Take 20 mg by mouth every evening.    Yes [provider]  torsemide (DEMADEX) 20 MG tablet TAKE 1 TABLET BY MOUTH DAILY AS NEEDED FOR SWELLING 11/03/16  Yes Setzer, Terri L, NP  vitamin B-12 (CYANOCOBALAMIN) 1000 MCG tablet Take 1,000 mcg by mouth daily.   Yes [provider]  zolpidem (AMBIEN) 10 MG tablet Take 1 tablet (10 mg total) by mouth at bedtime. 11/06/16  Yes Myrlene Broker, MD    Current Facility-Administered Medications  Medication Dose Route Frequency Provider Last Rate Last Dose  . acetaminophen (TYLENOL) tablet 650 mg  650 mg Oral Q6H PRN Sherryll Burger, Pratik D, DO       Or  . acetaminophen (TYLENOL) suppository 650 mg  650 mg Rectal Q6H PRN Sherryll Burger, Pratik D, DO      . acetaminophen (TYLENOL) tablet 500 mg  500 mg Oral Daily PRN Sherryll Burger, Pratik D, DO   500 mg at 01/11/17 0440  . amLODipine (NORVASC) tablet 15 mg  15 mg Oral Daily Sherryll Burger, Pratik D, DO   15 mg at 01/11/17 1610  . ARIPiprazole (ABILIFY) tablet 10 mg  10 mg Oral QHS Shah, Pratik D,  DO   10 mg at 01/10/17 2109  . bisacodyl (DULCOLAX) suppository 10 mg  10 mg Rectal Daily PRN Sherryll Burger, Pratik D, DO      . cholecalciferol (VITAMIN D) tablet 400 Units  400 Units Oral Daily Maurilio Lovely D, DO   400 Units at 01/11/17 [phone]  . dicyclomine (BENTYL) capsule 10 mg  10 mg Oral BID PRN Sherryll Burger, Pratik D, DO      . enoxaparin (LOVENOX) injection 40 mg  40 mg Subcutaneous Q24H Sherryll Burger, Pratik D, DO   40 mg at 01/10/17 2107  . lamoTRIgine (LAMICTAL) tablet 200 mg  200 mg Oral Daily Sherryll Burger, Pratik D, DO   200 mg at 01/11/17 5409  . losartan (COZAAR) tablet 100 mg  100 mg Oral Daily Sherryll Burger, Pratik D, DO   100 mg at 01/11/17 0919  . metoprolol tartrate (LOPRESSOR) tablet 12.5 mg  12.5 mg Oral BID Sherryll Burger, Pratik D, DO   12.5 mg at 01/11/17 0920  . morphine 2 MG/ML injection 1 mg  1 mg Intravenous Q4H PRN Sherryll Burger, Pratik D, DO   1 mg at 01/11/17 0935  . ondansetron (ZOFRAN) tablet 4 mg  4 mg Oral Q6H PRN Sherryll Burger, Pratik D, DO       Or  . ondansetron (ZOFRAN) injection 4 mg  4 mg Intravenous Q6H PRN Sherryll Burger, Pratik D, DO   4 mg at 01/11/17 0118  . pantoprazole (PROTONIX) EC tablet 40 mg  40 mg Oral Daily Sherryll Burger, Pratik D, DO   40 mg at 01/11/17 0919  . piperacillin-tazobactam (ZOSYN) IVPB 3.375 g  3.375 g Intravenous Q8H Shah, Pratik D, DO 12.5 mL/hr at 01/11/17 1259 3.375 g at 01/11/17 1259  . zolpidem (AMBIEN) tablet 5 mg  5 mg Oral QHS Shah, Pratik D, DO   5 mg at 01/10/17 2109    Allergies as of 01/10/2017 - Review Complete 01/10/2017  Allergen Reaction Noted  . Codeine Nausea And Vomiting 07/08/2013  . Oxycodone Nausea And Vomiting 11/07/2013    Family History  Problem Relation Age of Onset  . Diabetes Mother   . Coronary artery disease Father   . Gout Father   .  Breast cancer Sister   . Pancreatic cancer Brother   . Alcohol abuse Brother   . Healthy Brother   . Lung cancer Sister   . Healthy Sister   . Depression Sister   . Hyperlipidemia Daughter   . Hyperlipidemia Son   . Bipolar disorder Son   .  Healthy Sister   . Healthy Sister   . Seizures Other     Social History   Socioeconomic History  . Marital status: Married    Spouse name: Not on file  . Number of children: Not on file  . Years of education: Not on file  . Highest education level: Not on file  Social Needs  . Financial resource strain: Not on file  . Food insecurity - worry: Not on file  . Food insecurity - inability: Not on file  . Transportation needs - medical: Not on file  . Transportation needs - non-medical: Not on file  Occupational History  . Not on file  Tobacco Use  . Smoking status: Never Smoker  . Smokeless tobacco: Never Used  Substance and Sexual Activity  . Alcohol use: No    Alcohol/week: 0.0 oz  . Drug use: No  . Sexual activity: Not on file  Other Topics Concern  . Not on file  Social History Narrative  . Not on file    Review of Systems: See HPI, otherwise normal ROS  Physical Exam: Temp:  [98.6 F (37 C)-101.2 F (38.4 C)] 99.9 F (37.7 C) (12/13 1409) Pulse Rate:  [84-92] 86 (12/13 1409) Resp:  [18-20] 18 (12/13 1409) BP: (130-162)/(45-72) 131/45 (12/13 1409) SpO2:  [90 %-96 %] 91 % (12/13 1409) Weight:  [178 lb (80.7 kg)] 178 lb (80.7 kg) (12/12 2000)   Patient is alert and in no acute distress. Conjunctivae is pink.  Sclerae nonicteric. Oropharyngeal mucosa is normal. No neck masses or thyromegaly noted. Cardiac exam with regular rhythm normal S1 and S2.  Grade 2/6 systolic ejection murmur noted at left sternal border and aortic area. Lungs are clear to auscultation. Abdomen is full.  Bowel sounds are normal.  On palpation abdomen is soft with mild tenderness at RUQ and RLQ.  She has moderate tenderness in left lower quadrant with minimal guarding.  No organomegaly or masses. No peripheral edema or clubbing noted.  Lab Results: Recent Labs    01/10/17 1309 01/11/17 0610  WBC 10.4 11.2*  HGB 11.0* 9.7*  HCT 35.9* 31.6*  PLT 316 286   BMET Recent Labs     01/10/17 1309 01/11/17 0610  NA 137 137  K 4.1 3.6  CL 99* 102  CO2 28 23  GLUCOSE 105* 102*  BUN 17 17  CREATININE 0.90 0.79  CALCIUM 9.3 8.6*   LFT Recent Labs    01/11/17 0610  PROT 6.5  ALBUMIN 2.5*  AST 22  ALT 21  ALKPHOS 333*  BILITOT 0.8    Studies/Results: Ct Abdomen Pelvis W Contrast  Result Date: 01/10/2017 CLINICAL DATA:  Nausea and abdominal pain for 14 days. EXAM: CT ABDOMEN AND PELVIS WITH CONTRAST TECHNIQUE: Multidetector CT imaging of the abdomen and pelvis was performed using the standard protocol following bolus administration of intravenous contrast. CONTRAST:  100mL ISOVUE-300 IOPAMIDOL (ISOVUE-300) INJECTION 61% COMPARISON:  05/25/2015 FINDINGS: Lower chest: No acute abnormality. Hepatobiliary: No focal liver abnormality is seen. Status post cholecystectomy. No biliary dilatation. Pancreas: Unremarkable. No pancreatic ductal dilatation or surrounding inflammatory changes. Spleen: Normal in size without focal abnormality. Adrenals/Urinary Tract: Adrenal glands  are unremarkable. Kidneys are normal, without renal calculi, focal lesion, or hydronephrosis. Bladder is unremarkable. Stomach/Bowel: Normal appearance of the stomach and small bowel. Relatively symmetric mucosal thickening of the sigmoid colon, image 68/87, sequence 2, with pericolonic inflammatory changes and small amount of free fluid in the left pericolic gutter. Rim enhancing localized fluid collection just posterior to the abnormal portion of the sigmoid colon measures 2.6 x 2.3 x 2.7 cm. Background of diffuse diverticulosis. Vascular/Lymphatic: Aortic atherosclerosis. No enlarged abdominal or pelvic lymph nodes. Reproductive: Uterus and bilateral adnexa are unremarkable. Other: Small fat containing periumbilical anterior abdominal wall hernia. No abdominopelvic ascites. Musculoskeletal: Osteoarthritic changes of the lumbosacral spine. IMPRESSION: Rib collectively symmetric circumferential mucosal  thickening of the sigmoid colon, with pericolonic inflammatory changes, small amount of free fluid on the background of diffuse left colonic diverticulosis. This may represent acute diverticulitis, however underlying malignancy cannot be excluded. 2.7 cm rim enhancing fluid collection just posterior to the abnormal portion of the sigmoid colon may represent an abscess formation. Correlation to colonoscopy results, after resolution of the acute symptoms is recommended to exclude underlying malignancy. Electronically Signed   By: Ted Mcalpine M.D.   On: 01/10/2017 17:03   Abdominopelvic CT films reviewed with patient and her husband. CT film was also reviewed with Dr. Register of radiology.  Assessment;  #1. sigmoid colon diverticulitis.  Patient may have an early abscess.  It is too small and I doubt that it were required further intervention.  This will most likely resolve with antibiotic therapy.  She may need more than 2 weeks of therapy.  Significant symptomatic improvement in the last 24 hours.  Therefore would initiate a clear liquid diet.  #2. Elevated serum alkaline phosphatase with normal transaminases. She has a history of bile duct stricture which was treated with biliary stenting in June 2015 and stent was removed 3 months later.  She has no biliary symptoms.  Source of elevated serum alkaline could be cholestatic liver disease such as PBC or source could be non-hepatic.  Recommendations;  Begin clear liquids. Repeat LFTs in a.m. Antimitochondrial antibody with a.m. Lab. Continue IV Zosyn for now.  If she tolerates oral feeding she could be transitioned to oral antibiotic prior to discharge in 2 days or so. She will need follow-up CT in 4-6 weeks. She will also need colonoscopy when she has fully recovered unless she had colonoscopy in the last 2-3 years.   LOS: 1 day   Gisel Vipond  01/11/2017, 3:37 PM

## 2017-01-12 LAB — HEPATIC FUNCTION PANEL
ALBUMIN: 2.5 g/dL — AB (ref 3.5–5.0)
ALT: 18 U/L (ref 14–54)
AST: 17 U/L (ref 15–41)
Alkaline Phosphatase: 324 U/L — ABNORMAL HIGH (ref 38–126)
BILIRUBIN DIRECT: 0.2 mg/dL (ref 0.1–0.5)
Indirect Bilirubin: 0.6 mg/dL (ref 0.3–0.9)
TOTAL PROTEIN: 6.4 g/dL — AB (ref 6.5–8.1)
Total Bilirubin: 0.8 mg/dL (ref 0.3–1.2)

## 2017-01-12 NOTE — Progress Notes (Signed)
  Subjective:  Patient complains of left shoulder pain.  She states she was not able to sleep last night.  She states pain at LLQ is mild.  She denies nausea.  She is hungry.  Objective: Blood pressure (!) 133/41, pulse 78, temperature 98 F (36.7 C), temperature source Oral, resp. rate 18, height 5\' 4"  (1.626 m), weight 178 lb (80.7 kg), SpO2 (!) 89 %. Patient is alert.  She is being evaluated by physical therapist regarding shoulder issues. Abdomen is full.  Bowel sounds are normal.  On palpation it is soft.  She has mild to moderate tenderness at LLQ with some guarding.  Tenderness is fairly localized.  No organomegaly or masses. No LE edema or clubbing noted.  Labs/studies Results:  Recent Labs    01/10/17 1309 01/11/17 0610  WBC 10.4 11.2*  HGB 11.0* 9.7*  HCT 35.9* 31.6*  PLT 316 286    BMET  Recent Labs    01/10/17 1309 01/11/17 0610  NA 137 137  K 4.1 3.6  CL 99* 102  CO2 28 23  GLUCOSE 105* 102*  BUN 17 17  CREATININE 0.90 0.79  CALCIUM 9.3 8.6*    LFT  Recent Labs    01/10/17 1309 01/11/17 0610 01/12/17 0622  PROT 7.7 6.5 6.4*  ALBUMIN 3.1* 2.5* 2.5*  AST 24 22 17   ALT 21 21 18   ALKPHOS 294* 333* 324*  BILITOT 0.6 0.8 0.8  BILIDIR  --   --  0.2  IBILI  --   --  0.6     AMA is pending.  Assessment:  #1.  Sigmoid diverticulitis.  Patient is on IV Zosyn.  She had no difficulty with clear liquids.  She is responding to IV antibiotic.  #2.  Elevated serum alkaline phosphatase.  Transaminases are normal.  No evidence of dilated bile duct on CT performed 2 days ago.  Serum albumin is low but it would appear to be due to acute illness and diminished intake over the last 2 weeks.  Source of alkaline phosphatase could be GI tract.  AMA is pending.  #3.  Left shoulder pain.  She has had this pain for a while.  She is being evaluated by physical therapist.   Recommendations:  Advance diet to full liquids. Repeat LFTs in 2 weeks or so.

## 2017-01-12 NOTE — Evaluation (Signed)
Physical Therapy Evaluation Patient Details Name: Lindsay Palmer MRN: [id number] DOB: 08/02/1940 Today's Date: 01/12/2017   History of Present Illness  Lindsay Palmer is a4yo white female who comes to [facility] on 12/12 at request on her GI after 2w periumbilical pain., poor PO intake, and nausea: pt noted to be febrile in ED. PMH: NASH, diverticulitis, s/p cholecystectomy, migraine HA, osteopenia, HTN, bipolar d/o. Pt also reports to have left shoulder pain, which began 3-4 weeks ago when she lost her Rt grip of a laundry detergent jug and cauight it in her left hand. She reports feeling a pop in her shoulder, and has had subsequent opain at night and with ADL (bathing, dressing). At baseline pt is fully independent community dwelling adult, endorses no A/E use, no gait instability, no falls in 6 months.   Clinical Impression  Pt admitted with above diagnosis. Pt currently with functional limitations due to the deficits listed below (see "PT Problem List"). Upon entry, the patient is received semirecumbent in bed, awake and agreeable to participate, easily comes to sitting EOB for evaluation of shoulder pain. The pt is alert and oriented x3, pleasant, conversational, and a good historian. Strength assessment reveals good strength in bilat shoulders overall, with mild strength impairment in B shoulder flexion, left shoulder external rotation, and left elbow extension. Functional movement screening revealing of significant Bilat shoulder hypo mobility, with a capsular pattern loss of generalized glenohumeral mobilty (abeit pain free on right), loss of scapulothoracic rhythm with scapular hypomobility, and mild and rigid thoracic spine kyphosis. Deep palpation to shoulder complex reveals pain upon palpation of anterior deltoid and supraspinatus, consistent with shoulder flexion strength deficits. Isolated rotator cuff testing reveals mild aggravation of tissue with generally good strength, and A/ROM that is limited  more by capsular restrictions of the joint than isolated muscle injury. Pt presentation is consistent with nonspecific shoulder pain s/p acute overload injury, with generalized aggravation of functional groups. *see below for detailed BUE examination. Pt is a good candidate for outpatient PT at [facility] and is anticipated to have a full resolution of pain and shoulder dysfunciton. This Lindsay Palmer anticipates good outcome with conservative management and outpatient services. Patient is at baseline for functional mobility, fully independent. All education completed, and time is given to address all questions/concerns. No additional skilled PT services needed at this time, PT signing off. PT recommends daily ambulation ad lib or with nursing staff as needed to prevent deconditioning.      Follow Up Recommendations Outpatient PT(preferes [facility] [place], [place] )    Equipment Recommendations  None recommended by PT    Recommendations for Other Services       Precautions / Restrictions Precautions Precautions: None      Mobility  Bed Mobility Overal bed mobility: Independent                Transfers                    Ambulation/Gait                Stairs            Wheelchair Mobility    Modified Rankin (Stroke Patients Only)       Balance Overall balance assessment: No apparent balance deficits (not formally assessed)  Pertinent Vitals/Pain Pain Assessment: 0-10 Pain Score: 4  Pain Location: Left anterior shoulder  Pain Descriptors / Indicators: Aching Pain Intervention(s): Limited activity within patient's tolerance;Monitored during session;Premedicated before session    Home Living Family/patient expects to be discharged to:: Private residence Living Arrangements: Spouse/significant other Available Help at Discharge: Family Type of Home: House Home Access: Level entry     Home Layout: Bed/bath  upstairs;Laundry or work area in Education administrator: None      Prior Function Level of Independence: Independent               Journalist, newspaper   Dominant Hand: Right    Extremity/Trunk Assessment   Upper Extremity Assessment Upper Extremity Assessment: RUE deficits/detail;LUE deficits/detail   MMT Strength Assessment:  Left Right Response  Shoulder flexion  4 of 5 4 of 5 resisted pain minimally worse than isometric (Lt)   Shoulder external rotation 4+/5 5 of 5 tolerated well   Shoulder internal rotation 5 of 5 5 of 5 tolerated well   Elbow flexion  5 of 5 5 of 5 resisted pain minimally worse than isometric (Lt)  Elbow extension  5 of 5 5 of 5 tolerated well   Grip  WNL WNL   ROM Assessment:  Left Right Response  Shoulder flexion  140 degrees 120 degrees painful on Left, bilat hard end feel   Shoulder Abduction 96 degrees 108 degrees painful on left, bilat hard end feel   Shoulder external rotation 98 degrees 83 degrees pain free  Shoulder internal rotation 24 degrees 33 degrees severe pain on left at end range  Palpation Left  Right  anterior deltoid painful, taut bands WNL  medial deltoid tender  WNL  posterior deltoid WNL  WNL  elbow flexors  in spasm, tender WNL  supraspinatus very painful  WNL  infraspinatus  painful  WNL           Cervical / Trunk Assessment Cervical / Trunk Assessment: Kyphotic(rigid mild thoracic kyphosis)  Communication   Communication: No difficulties  Cognition Arousal/Alertness: Awake/alert Behavior During Therapy: WFL for tasks assessed/performed Overall Cognitive Status: Within Functional Limits for tasks assessed                                        General Comments      Exercises     Assessment/Plan    PT Assessment All further PT needs can be met in the next venue of care  PT Problem List Decreased range of motion;Decreased mobility       PT Treatment Interventions      PT Goals  (Current goals can be found in the Care Plan section)  Acute Rehab PT Goals Patient Stated Goal: resolve shoulder pain, improve sleep quality  PT Goal Formulation: All assessment and education complete, DC therapy    Frequency     Barriers to discharge        Co-evaluation               AM-PAC PT "6 Clicks" Daily Activity  Outcome Measure Difficulty turning over in bed (including adjusting bedclothes, sheets and blankets)?: None Difficulty moving from lying on back to sitting on the side of the bed? : None Difficulty sitting down on and standing up from a chair with arms (e.g., wheelchair, bedside commode, etc,.)?: None Help needed moving to and from a bed to chair (including a  wheelchair)?: None Help needed walking in hospital room?: None Help needed climbing 3-5 steps with a railing? : None 6 Click Score: 24    End of Session   Activity Tolerance: Patient tolerated treatment well;Patient limited by pain Patient left: in bed   PT Visit Diagnosis: Pain Pain - Right/Left: Left Pain - part of body: Shoulder    Time: 8441-7127 PT Time Calculation (min) (ACUTE ONLY): 27 min   Charges:   PT Evaluation $PT Eval Low Complexity: 1 Low     PT G Codes:   PT G-Codes **NOT FOR INPATIENT CLASS** Functional Assessment Tool Used: AM-PAC 6 Clicks Basic Mobility Functional Limitation: Self care Self Care Current Status (K7183): At least 20 percent but less than 40 percent impaired, limited or restricted Self Care Goal Status (O7255): At least 20 percent but less than 40 percent impaired, limited or restricted Self Care Discharge Status [phone]): At least 20 percent but less than 40 percent impaired, limited or restricted    10:00 AM, 01/12/17 Etta Grandchild, PT, DPT Physical Therapist - [place] [phone] (Office)    Chetara Kropp C 01/12/2017, 9:34 AM

## 2017-01-12 NOTE — Care Management Important Message (Signed)
Important Message  Patient Details  Name: Lindsay LinerHelen Palmer MRN: [id number] Date of Birth: 09/26/1940   Medicare Important Message Given:  Yes    Malcolm MetroChildress, Milady Fleener Demske, RN 01/12/2017, 9:51 AM

## 2017-01-12 NOTE — Care Management Note (Signed)
Case Management Note  Patient Details  Name: Lindsay Palmer MRN: [name]5 Date of Birth: [birth year]  Expected Discharge Date:      01/12/2017            Expected Discharge Plan:  Home/Self Care  In-House Referral:  NA  Discharge planning Services  NA  Post Acute Care Choice:  NA Choice offered to:  NA  Status of Service:  Completed, signed off   Additional Comments: PT has recommended OP PT. Pt has been to [facility] in [place], [place]. She wants to go back there. CM has called [facility] and requested referral form. Have not yet received via fax, has called back and requested form be faxed to another number. CM has faxed pt info and documentation of needs. CM will have referral signed and return to [facility] once received.   Malcolm Metrohildress, Vignesh Willert Demske, RN 01/12/2017, 2:34 PM

## 2017-01-12 NOTE — Progress Notes (Signed)
PROGRESS NOTE    Lindsay LinerHelen Bartha  UJW:[id number] DOB: Sep 06, 1940 DOA: 01/10/2017 PCP: Garald BraverElliott, Dianne E    Brief Narrative:  Lindsay Palmer is a 76 y.o. female with medical history significant for diverticulosis, nonalcoholic fatty liver disease, prior pancreatitis with biliary stricture, and prior cholecystectomy who presents to the emergency department at the urging of her gastroenterologist Dr. Karilyn Cotaehman on account of the periumbilical as well as lower abdominal pain that has been persistent for the last 2 weeks.  She typically has periods of intermittent abdominal pain that is similar nature and she states that this is been ongoing for quite some time even after an ERCP that she had 2 years ago.  She has had quite a poor appetite and has been persistently nauseated, and therefore she has not been eating much at all.  She denies any actual vomiting or diarrhea.  She has been able to take her usual medications as prescribed.   ED Course: Her vital signs are noted to be stable but she is febrile with a temperature of 101.2 Fahrenheit.  Her labs do not demonstrate any significant abnormalities aside from some mild anemia and alkaline phosphatase elevation (this appears to be persistent per conversation with Dr. Karilyn Cotaehman).  CT of her abdomen and pelvis with contrast demonstrates diverticulosis with pericolonic inflammation and a small amount of free fluid likely suggestive of diverticulitis.  She has been given some Rocephin as well as Flagyl for empiric treatment as well as some IV fluid.  She states that she received some morphine which gave her relief of her pain.     Assessment & Plan:   Principal Problem:   Diverticulitis Active Problems:   Benign essential HTN   Urinary retention   Anemia   Constipation   Bipolar I disorder, most recent episode depressed (HCC)  Acute diverticulitis Placed on IV Zosyn for empiric treatment Stool cultures Dr. Karilyn Cotaehman consulted Started on a full liquid  diet Morphine prn abdominal pain Zofran prn nausea If patient tolerates diet and pain is controlled will advance diet later this evening per GI   Hypertension Pressure well controlled  Anemia CBC in a.m.  Bipolar I Disorder Continue home medications  Left shoulder pain Outpatient PT   DVT prophylaxis: Lovenox Code Status: Full code Family Communication: Husband bedside Disposition Plan: Pending improvement   Consultants:   GI with Dr. Karilyn Cotaehman  Procedures:   None  Antimicrobials:   Zosyn   Subjective: Patient seen shortly after lunch.  She is standing and curling her hair with a curling iron.  She reports that she feels significantly better than she did yesterday  Objective: Vitals:   01/11/17 1409 01/11/17 1900 01/12/17 0556 01/12/17 1300  BP: (!) 131/45 (!) 144/58 (!) 133/41 (!) 143/47  Pulse: 86 90 78 82  Resp: 18 18 18 18   Temp: 99.9 F (37.7 C) (!) 97.5 F (36.4 C) 98 F (36.7 C) 98.3 F (36.8 C)  TempSrc:  Oral Oral Oral  SpO2: 91% 99% (!) 89% 95%  Weight:      Height:        Intake/Output Summary (Last 24 hours) at 01/12/2017 1632 Last data filed at 01/12/2017 1200 Gross per 24 hour  Intake 650 ml  Output -  Net 650 ml   Filed Weights   01/10/17 1247 01/10/17 2000  Weight: 79.8 kg (176 lb) 80.7 kg (178 lb)    Examination:  General exam: Appears calm and comfortable  Respiratory system: Clear to auscultation. Respiratory effort normal. Cardiovascular  system: S1 & S2 heard, RRR. No JVD, murmurs, rubs, gallops or clicks. No pedal edema. Gastrointestinal system: Abdomen is nondistended, soft and tenderness is improved from previous exam now only tender in left lower quadrant on deep palpation no organomegaly or masses felt. Normal bowel sounds heard. Central nervous system: Alert and oriented. No focal neurological deficits. Extremities: Symmetric 5 x 5 power.  Pain in left shoulder with internal rotation during flexion Skin: No  rashes, lesions or ulcers Psychiatry: Judgement and insight appear normal. Mood & affect appropriate.     Data Reviewed: I have personally reviewed following labs and imaging studies  CBC: Recent Labs  Lab 01/10/17 1309 01/11/17 0610  WBC 10.4 11.2*  HGB 11.0* 9.7*  HCT 35.9* 31.6*  MCV 90.9 90.5  PLT 316 286   Basic Metabolic Panel: Recent Labs  Lab 01/10/17 1309 01/11/17 0610  NA 137 137  K 4.1 3.6  CL 99* 102  CO2 28 23  GLUCOSE 105* 102*  BUN 17 17  CREATININE 0.90 0.79  CALCIUM 9.3 8.6*   GFR: Estimated Creatinine Clearance: 62.4 mL/min (by C-G formula based on SCr of 0.79 mg/dL). Liver Function Tests: Recent Labs  Lab 01/10/17 1309 01/11/17 0610 01/12/17 0622  AST 24 22 17   ALT 21 21 18   ALKPHOS 294* 333* 324*  BILITOT 0.6 0.8 0.8  PROT 7.7 6.5 6.4*  ALBUMIN 3.1* 2.5* 2.5*   Recent Labs  Lab 01/10/17 1309  LIPASE 15   No results for input(s): AMMONIA in the last 168 hours. Coagulation Profile: No results for input(s): INR, PROTIME in the last 168 hours. Cardiac Enzymes: No results for input(s): CKTOTAL, CKMB, CKMBINDEX, TROPONINI in the last 168 hours. BNP (last 3 results) No results for input(s): PROBNP in the last 8760 hours. HbA1C: No results for input(s): HGBA1C in the last 72 hours. CBG: Recent Labs  Lab 01/11/17 0753  GLUCAP 88   Lipid Profile: No results for input(s): CHOL, HDL, LDLCALC, TRIG, CHOLHDL, LDLDIRECT in the last 72 hours. Thyroid Function Tests: No results for input(s): TSH, T4TOTAL, FREET4, T3FREE, THYROIDAB in the last 72 hours. Anemia Panel: No results for input(s): VITAMINB12, FOLATE, FERRITIN, TIBC, IRON, RETICCTPCT in the last 72 hours. Sepsis Labs: No results for input(s): PROCALCITON, LATICACIDVEN in the last 168 hours.  No results found for this or any previous visit (from the past 240 hour(s)).       Radiology Studies: Ct Abdomen Pelvis W Contrast  Result Date: 01/10/2017 CLINICAL DATA:  Nausea  and abdominal pain for 14 days. EXAM: CT ABDOMEN AND PELVIS WITH CONTRAST TECHNIQUE: Multidetector CT imaging of the abdomen and pelvis was performed using the standard protocol following bolus administration of intravenous contrast. CONTRAST:  ISOVUE-300 IOPAMIDOL (ISOVUE-300) INJECTION 61% COMPARISON:  05/25/2015 FINDINGS: Lower chest: No acute abnormality. Hepatobiliary: No focal liver abnormality is seen. Status post cholecystectomy. No biliary dilatation. Pancreas: Unremarkable. No pancreatic ductal dilatation or surrounding inflammatory changes. Spleen: Normal in size without focal abnormality. Adrenals/Urinary Tract: Adrenal glands are unremarkable. Kidneys are normal, without renal calculi, focal lesion, or hydronephrosis. Bladder is unremarkable. Stomach/Bowel: Normal appearance of the stomach and small bowel. Relatively symmetric mucosal thickening of the sigmoid colon, image 68/87, sequence 2, with pericolonic inflammatory changes and small amount of free fluid in the left pericolic gutter. Rim enhancing localized fluid collection just posterior to the abnormal portion of the sigmoid colon measures 2.6 x 2.3 x 2.7 cm. Background of diffuse diverticulosis. Vascular/Lymphatic: Aortic atherosclerosis. No enlarged abdominal or pelvic  lymph nodes. Reproductive: Uterus and bilateral adnexa are unremarkable. Other: Small fat containing periumbilical anterior abdominal wall hernia. No abdominopelvic ascites. Musculoskeletal: Osteoarthritic changes of the lumbosacral spine. IMPRESSION: Rib collectively symmetric circumferential mucosal thickening of the sigmoid colon, with pericolonic inflammatory changes, small amount of free fluid on the background of diffuse left colonic diverticulosis. This may represent acute diverticulitis, however underlying malignancy cannot be excluded. 2.7 cm rim enhancing fluid collection just posterior to the abnormal portion of the sigmoid colon may represent an abscess  formation. Correlation to colonoscopy results, after resolution of the acute symptoms is recommended to exclude underlying malignancy. Electronically Signed   By: Ted Mcalpineobrinka  Dimitrova M.D.   On: 01/10/2017 17:03        Scheduled Meds: . amLODipine  15 mg Oral Daily  . ARIPiprazole  10 mg Oral QHS  . cholecalciferol  400 Units Oral Daily  . enoxaparin (LOVENOX) injection  40 mg Subcutaneous Q24H  . lamoTRIgine  200 mg Oral Daily  . losartan  100 mg Oral Daily  . metoprolol tartrate  12.5 mg Oral BID  . pantoprazole  40 mg Oral Daily  . zolpidem  5 mg Oral QHS   Continuous Infusions: . piperacillin-tazobactam 3.375 g (01/12/17 1159)     LOS: 2 days    Time spent: 25 minutes    Katrinka BlazingAlex U Kadolph, MD [facility] Pager [phone]  If 7PM-7AM, please contact night-coverage [email or web address] Password [email or web address] 01/12/2017, 4:32 PM

## 2017-01-13 DIAGNOSIS — K5900 Constipation, unspecified: Secondary | ICD-10-CM

## 2017-01-13 LAB — GLUCOSE, CAPILLARY: Glucose-Capillary: 98 mg/dL (ref 65–99)

## 2017-01-13 LAB — C DIFFICILE QUICK SCREEN W PCR REFLEX
C DIFFICLE (CDIFF) ANTIGEN: NEGATIVE
C Diff interpretation: NOT DETECTED
C Diff toxin: NEGATIVE

## 2017-01-13 LAB — MITOCHONDRIAL ANTIBODIES: MITOCHONDRIAL M2 AB, IGG: 6.5 U (ref 0.0–20.0)

## 2017-01-13 MED ORDER — METRONIDAZOLE 500 MG PO TABS: 500.0000 mg | ORAL_TABLET | Freq: Two times a day (BID) | ORAL | 0 refills | Status: AC

## 2017-01-13 MED ORDER — CIPROFLOXACIN HCL 500 MG PO TABS: 500.0000 mg | ORAL_TABLET | Freq: Two times a day (BID) | ORAL | 0 refills | Status: AC

## 2017-01-13 MED ORDER — METRONIDAZOLE 500 MG PO TABS
500.0000 mg | ORAL_TABLET | Freq: Two times a day (BID) | ORAL | Status: DC
  Administered 2017-01-13: 500 mg via ORAL
  Filled 2017-01-13: qty 1

## 2017-01-13 MED ORDER — CIPROFLOXACIN HCL 250 MG PO TABS
500.0000 mg | ORAL_TABLET | Freq: Two times a day (BID) | ORAL | Status: DC
  Administered 2017-01-13: 500 mg via ORAL
  Filled 2017-01-13: qty 2

## 2017-01-13 NOTE — Progress Notes (Signed)
  Subjective:  Patient denies abdominal pain but complains of diarrhea.  She had 3 or 4 loose stools yesterday and she is already had 2 this morning.  Stool volume is small.  She does not feel bad.  She did not have much shoulder pain last night.  She was able to rest.  She ate some of her breakfast.  Diet was changed to low residue diet but she did not get it.  Objective: Blood pressure (!) 142/55, pulse 77, temperature 98.6 F (37 C), temperature source Oral, resp. rate 16, height 5\' 4"  (1.626 m), weight 178 lb (80.7 kg), SpO2 97 %. Patient is alert and in no acute distress.. Abdomen is full.  Bowel sounds are normal.  On palpation abdomen is soft.  She has mild tenderness at LLQ without guarding.  Mild tenderness also noted in epigastric region. Trace edema around ankles.  Right greater than left.  Labs/studies Results:  Recent Labs    01/10/17 1309 01/11/17 0610  WBC 10.4 11.2*  HGB 11.0* 9.7*  HCT 35.9* 31.6*  PLT 316 286    BMET  Recent Labs    01/10/17 1309 01/11/17 0610  NA 137 137  K 4.1 3.6  CL 99* 102  CO2 28 23  GLUCOSE 105* 102*  BUN 17 17  CREATININE 0.90 0.79  CALCIUM 9.3 8.6*    LFT  Recent Labs    01/10/17 1309 01/11/17 0610 01/12/17 0622  PROT 7.7 6.5 6.4*  ALBUMIN 3.1* 2.5* 2.5*  AST 24 22 17   ALT 21 21 18   ALKPHOS 294* 333* 324*  BILITOT 0.6 0.8 0.8  BILIDIR  --   --  0.2  IBILI  --   --  0.6     Assessment:  #1.  Sigmoid diverticulitis.  Patient has responded to IV antibiotic.  She is tolerating full liquids.  Diet has been advanced.  Will transition to oral antibiotics.  She should be able to go home after lunch if she does well. Since CT suggested a small abscess will need antibiotic for total of 2 weeks.  #2.  Await his serum alkaline phosphatase.  AMA is pending.  CT did not show biliary dilation or other abnormalities.  Source could be GI tract.  #3.  Anemia.  Anemia appears to be secondary to acute illness.  No evidence of GI  bleed.  #4.  She has had diarrhea since yesterday.  She does not appear to be acutely ill or toxic.  Since she has been on IV antibiotic will screen for C. difficile if she has another loose stool.  Recommendations:  Diet advance to low residue diet. Stool test  For bC. difficile quick scan. DC IV antibiotic. Cipro 500 mg p.o. twice daily for 2 weeks. Metronidazole 500 mg p.o. twice daily for 2 weeks. We will plan to see patient in the office in 2 weeks at which time she will have CBC and LFTs repeated. Patient should keep NSAID use to minimum.

## 2017-01-13 NOTE — Progress Notes (Signed)
Discharge instructions gone over with patient and spouse, verbalized understanding. Number to [facility] in [place] for outpatient therapy found and given to patient so that patient can follow up on Monday about getting home therapy. IV removed, patient tolerated procedure well.

## 2017-01-13 NOTE — Discharge Summary (Signed)
Physician Discharge Summary  Lindsay LinerHelen Palmer ZOX:[id number] DOB: [birth year] DOA: 01/10/2017  PCP: Richarda BladeElliott, Dianne E  Admit date: 01/10/2017 Discharge date: 01/13/2017  Admitted From: Home Disposition:  Home  Recommendations for Outpatient Follow-up:  1. Follow up with PCP in 1-2 weeks 2. Follow-up with Dr. Karilyn Cotaehman in 2 weeks 3. Please obtain BMP/CBC in one week 4. Take antibiotics as prescribed  Home Health:No  Equipment/Devices: None   Discharge Condition:Stable  CODE STATUS: Full Code  Diet recommendation: Heart Healthy  Brief/Interim Summary: Lindsay JacobsonHelen Turpinis a 76 y.o.femalewith medical history significant fordiverticulosis, nonalcoholic fatty liver disease, prior pancreatitiswith biliary stricture, andpriorcholecystectomy who presents to the emergency department at the urging of her gastroenterologist Dr. Crista Elliotehmanon account of the periumbilical as well as lower abdominal pain that has been persistent for the last 2 weeks. She typically has periods of intermittent abdominal pain that is similar nature and she states that this is been ongoing for quite some time even after an ERCP that she had 2 years ago. She has had quite a poor appetite and has been persistently nauseated, and therefore she has not been eating much at all. She denies any actual vomiting or diarrhea. She has been able to take her usualmedications as prescribed.  ED Course:Her vital signs are noted to be stable but she is febrile with a temperature of 101.2 Fahrenheit. Her labs do not demonstrate any significant abnormalities aside from some mild anemia and alkaline phosphatase elevation (this appears to be persistent per conversation with Dr. Karilyn Cotaehman). CT of her abdomen and pelvis with contrast demonstrates diverticulosis with pericolonic inflammation and a small amount of free fluid likely suggestive of diverticulitis. She has been given some Rocephin as well as Flagyl for empiric treatment as well as some IV  fluid. She states that she received some morphine which gave her relief of her pain.  Patient was admitted to the hospital and gastroenterology was consulted.  She was started on Zosyn and placed n.p.o.  Her pain was controlled with morphine.  She was started on a clear liquid diet which was advanced to full liquid and finally to a regular diet on day of discharge.  She experienced loose stools on day of discharge and C. difficile test was ordered and was negative.  She was instructed to take ciprofloxacin and Flagyl for 2 weeks and follow-up with Dr. Karilyn Cotaehman within 2 weeks.  She will need a repeat CBC and LFT in 2 weeks at that follow-up appointment.  Discharge Diagnoses:  Principal Problem:   Diverticulitis Active Problems:   Benign essential HTN   Urinary retention   Anemia   Constipation   Bipolar I disorder, most recent episode depressed [facility](H[facility])    Discharge Instructions  Discharge Instructions    Call MD for:  difficulty breathing, headache or visual disturbances   Complete by:  As directed    Call MD for:  extreme fatigue   Complete by:  As directed    Call MD for:  hives   Complete by:  As directed    Call MD for:  persistant dizziness or light-headedness   Complete by:  As directed    Call MD for:  persistant nausea and vomiting   Complete by:  As directed    Call MD for:  severe uncontrolled pain   Complete by:  As directed    Call MD for:  temperature >100.4   Complete by:  As directed    Diet - low sodium heart healthy   Complete by:  As directed    Discharge instructions   Complete by:  As directed    Outpatient PT Follow up with Dr. Karilyn Cotaehman Medications as prescribed   Increase activity slowly   Complete by:  As directed      Allergies as of 01/13/2017      Reactions   Codeine Nausea And Vomiting   Oxycodone Nausea And Vomiting      Medication List    TAKE these medications   acetaminophen 500 MG tablet Commonly known as:  TYLENOL Take 500 mg by mouth  daily as needed for moderate pain or headache.   amLODipine 5 MG tablet Commonly known as:  NORVASC Take 7.5 mg by mouth daily.   ARIPiprazole 10 MG tablet Commonly known as:  ABILIFY Take 1 tablet (10 mg total) by mouth at bedtime.   ciprofloxacin 500 MG tablet Commonly known as:  CIPRO Take 1 tablet (500 mg total) by mouth 2 (two) times daily for 14 days.   dicyclomine 10 MG capsule Commonly known as:  BENTYL Take 1 capsule (10 mg total) by mouth 2 (two) times daily as needed for spasms. before breakfast and lunch.   FISH OIL + D3 PO Take 1 capsule by mouth 2 (two) times daily.   lamoTRIgine 200 MG tablet Commonly known as:  LAMICTAL Take 1 tablet (200 mg total) by mouth daily.   losartan 100 MG tablet Commonly known as:  COZAAR Take 100 mg by mouth daily.   meloxicam 15 MG tablet Commonly known as:  MOBIC Take 15 mg by mouth daily with supper.   metoprolol tartrate 25 MG tablet Commonly known as:  LOPRESSOR Take 12.5 mg by mouth 2 (two) times daily.   metroNIDAZOLE 500 MG tablet Commonly known as:  FLAGYL Take 1 tablet (500 mg total) by mouth every 12 (twelve) hours for 14 days.   omeprazole 20 MG capsule Commonly known as:  PRILOSEC Take 1 capsule (20 mg total) by mouth 2 (two) times daily before a meal.   ondansetron 4 MG tablet Commonly known as:  ZOFRAN TAKE 1 TABLET BY MOUTH EVERY 8 HOURS AS NEEDED FOR NAUSEA OR VOMITING   OVER THE COUNTER MEDICATION Fiber well - 5 grams 1 chewable tablet twice daily.   polyethylene glycol powder powder Commonly known as:  GLYCOLAX/MIRALAX TAKE 8.5 GRAMS(1/2 CAPFUL) MIXED IN FLUID AND DRANK EVERY OTHER DAY What changed:  additional instructions   potassium chloride 10 MEQ tablet Commonly known as:  K-DUR Take 2 tablets (20 mEq total) by mouth as needed. Patient is to take this on the days she takes the Demadex.   simvastatin 20 MG tablet Commonly known as:  ZOCOR Take 20 mg by mouth every evening.   torsemide  20 MG tablet Commonly known as:  DEMADEX TAKE 1 TABLET BY MOUTH DAILY AS NEEDED FOR SWELLING   vitamin B-12 1000 MCG tablet Commonly known as:  CYANOCOBALAMIN Take 1,000 mcg by mouth daily.   Vitamin D 400 units capsule Take 400 Units by mouth daily.   zolpidem 10 MG tablet Commonly known as:  AMBIEN Take 1 tablet (10 mg total) by mouth at bedtime.      Follow-up Information    Richarda Bladelliott, Dianne E. Schedule an appointment as soon as possible for a visit in 1 week(s).   Specialty:  Nurse Practitioner Contact information: [address] [place] [id number] [phone]        Malissa Hippoehman, Najeeb U, MD. Schedule an appointment as soon as possible for a  visit in 2 week(s).   Specialty:  Gastroenterology Contact information: [address], [address] [place] [address] [phone]          Allergies  Allergen Reactions  . Codeine Nausea And Vomiting  . Oxycodone Nausea And Vomiting    Consultations:  Gastroenterology   Procedures/Studies: Ct Abdomen Pelvis W Contrast  Result Date: 01/10/2017 CLINICAL DATA:  Nausea and abdominal pain for 14 days. EXAM: CT ABDOMEN AND PELVIS WITH CONTRAST TECHNIQUE: Multidetector CT imaging of the abdomen and pelvis was performed using the standard protocol following bolus administration of intravenous contrast. CONTRAST:  ISOVUE-300 IOPAMIDOL (ISOVUE-300) INJECTION 61% COMPARISON:  05/25/2015 FINDINGS: Lower chest: No acute abnormality. Hepatobiliary: No focal liver abnormality is seen. Status post cholecystectomy. No biliary dilatation. Pancreas: Unremarkable. No pancreatic ductal dilatation or surrounding inflammatory changes. Spleen: Normal in size without focal abnormality. Adrenals/Urinary Tract: Adrenal glands are unremarkable. Kidneys are normal, without renal calculi, focal lesion, or hydronephrosis. Bladder is unremarkable. Stomach/Bowel: Normal appearance of the stomach and small bowel. Relatively symmetric mucosal  thickening of the sigmoid colon, image 68/87, sequence 2, with pericolonic inflammatory changes and small amount of free fluid in the left pericolic gutter. Rim enhancing localized fluid collection just posterior to the abnormal portion of the sigmoid colon measures 2.6 x 2.3 x 2.7 cm. Background of diffuse diverticulosis. Vascular/Lymphatic: Aortic atherosclerosis. No enlarged abdominal or pelvic lymph nodes. Reproductive: Uterus and bilateral adnexa are unremarkable. Other: Small fat containing periumbilical anterior abdominal wall hernia. No abdominopelvic ascites. Musculoskeletal: Osteoarthritic changes of the lumbosacral spine. IMPRESSION: Rib collectively symmetric circumferential mucosal thickening of the sigmoid colon, with pericolonic inflammatory changes, small amount of free fluid on the background of diffuse left colonic diverticulosis. This may represent acute diverticulitis, however underlying malignancy cannot be excluded. 2.7 cm rim enhancing fluid collection just posterior to the abnormal portion of the sigmoid colon may represent an abscess formation. Correlation to colonoscopy results, after resolution of the acute symptoms is recommended to exclude underlying malignancy. Electronically Signed   By: Ted Mcalpine M.D.   On: 01/10/2017 17:03     Subjective: Patient seen this morning.  She voices she feels well and denies having any abdominal pain.  She is anxious to go home.  She is excited she has outpatient physical therapy for her shoulder.  Discharge Exam: Vitals:   01/13/17 0648 01/13/17 0929  BP: (!) 148/57 (!) 142/55  Pulse: 82 77  Resp: 18 16  Temp: 97.8 F (36.6 C) 98.6 F (37 C)  SpO2: 97% 97%   Vitals:   01/12/17 1300 01/12/17 2219 01/13/17 0648 01/13/17 0929  BP: (!) 143/47 (!) 168/38 (!) 148/57 (!) 142/55  Pulse: 82 82 82 77  Resp: 18 18 18 16   Temp: 98.3 F (36.8 C) 97.8 F (36.6 C) 97.8 F (36.6 C) 98.6 F (37 C)  TempSrc: Oral Oral Oral Oral  SpO2:  95% 97% 97% 97%  Weight:      Height:        General: Pt is alert, awake, not in acute distress Cardiovascular: RRR, S1/S2 +, no rubs, no gallops Respiratory: CTA bilaterally, no wheezing, no rhonchi Abdominal: Soft, NT, ND, bowel sounds + Extremities: no edema, no cyanosis    The results of significant diagnostics from this hospitalization (including imaging, microbiology, ancillary and laboratory) are listed below for reference.     Microbiology: Recent Results (from the past 240 hour(s))  C difficile quick scan w PCR reflex     Status: None  Collection Time: 01/13/17 10:24 AM  Result Value Ref Range Status   C Diff antigen NEGATIVE NEGATIVE Final   C Diff toxin NEGATIVE NEGATIVE Final   C Diff interpretation No C. difficile detected.  Final     Labs: BNP (last 3 results) No results for input(s): BNP in the last 8760 hours. Basic Metabolic Panel: Recent Labs  Lab 01/10/17 1309 01/11/17 0610  NA 137 137  K 4.1 3.6  CL 99* 102  CO2 28 23  GLUCOSE 105* 102*  BUN 17 17  CREATININE 0.90 0.79  CALCIUM 9.3 8.6*   Liver Function Tests: Recent Labs  Lab 01/10/17 1309 01/11/17 0610 01/12/17 0622  AST 24 22 17   ALT 21 21 18   ALKPHOS 294* 333* 324*  BILITOT 0.6 0.8 0.8  PROT 7.7 6.5 6.4*  ALBUMIN 3.1* 2.5* 2.5*   Recent Labs  Lab 01/10/17 1309  LIPASE 15   No results for input(s): AMMONIA in the last 168 hours. CBC: Recent Labs  Lab 01/10/17 1309 01/11/17 0610  WBC 10.4 11.2*  HGB 11.0* 9.7*  HCT 35.9* 31.6*  MCV 90.9 90.5  PLT 316 286   Cardiac Enzymes: No results for input(s): CKTOTAL, CKMB, CKMBINDEX, TROPONINI in the last 168 hours. BNP: Invalid input(s): POCBNP CBG: Recent Labs  Lab 01/11/17 0753 01/13/17 0747  GLUCAP 88 98   D-Dimer No results for input(s): DDIMER in the last 72 hours. Hgb A1c No results for input(s): HGBA1C in the last 72 hours. Lipid Profile No results for input(s): CHOL, HDL, LDLCALC, TRIG, CHOLHDL, LDLDIRECT in  the last 72 hours. Thyroid function studies No results for input(s): TSH, T4TOTAL, T3FREE, THYROIDAB in the last 72 hours.  Invalid input(s): FREET3 Anemia work up No results for input(s): VITAMINB12, FOLATE, FERRITIN, TIBC, IRON, RETICCTPCT in the last 72 hours. Urinalysis    Component Value Date/Time   COLORURINE AMBER (A) 01/10/2017 1256   APPEARANCEUR HAZY (A) 01/10/2017 1256   LABSPEC 1.029 01/10/2017 1256   PHURINE 5.0 01/10/2017 1256   GLUCOSEU NEGATIVE 01/10/2017 1256   HGBUR NEGATIVE 01/10/2017 1256   BILIRUBINUR NEGATIVE 01/10/2017 1256   KETONESUR 20 (A) 01/10/2017 1256   PROTEINUR 30 (A) 01/10/2017 1256   UROBILINOGEN 0.2 07/20/2013 1740   NITRITE NEGATIVE 01/10/2017 1256   LEUKOCYTESUR SMALL (A) 01/10/2017 1256   Sepsis Labs Invalid input(s): PROCALCITONIN,  WBC,  LACTICIDVEN Microbiology Recent Results (from the past 240 hour(s))  C difficile quick scan w PCR reflex     Status: None   Collection Time: 01/13/17 10:24 AM  Result Value Ref Range Status   C Diff antigen NEGATIVE NEGATIVE Final   C Diff toxin NEGATIVE NEGATIVE Final   C Diff interpretation No C. difficile detected.  Final     Time coordinating discharge: 35 minutes  SIGNED:   Katrinka Blazing, MD  [facility] 01/13/2017, 2:01 PM Pager [phone] If 7PM-7AM, please contact night-coverage [email or web address] Password [email or web address]

## 2017-01-13 NOTE — Progress Notes (Signed)
No complaints of nausea or pain today. Diet advanced and po abx started with no adverse reaction.Negative for cdiff.  To be discharged home on po abx.

## 2017-01-16 ENCOUNTER — Telehealth (INDEPENDENT_AMBULATORY_CARE_PROVIDER_SITE_OTHER): Payer: Self-pay | Admitting: *Deleted

## 2017-01-16 NOTE — Telephone Encounter (Signed)
Patient called the office this morning and states that she has having 4-5 bouts or more of diarrhea a day. She was like this in the hospital. She had been taking the Imodium as prescribed and it is not helping. She ask if there is something else that can be called in. Per Dr.REhman may call in the Lomotil -she may take 1 by mouth three times daily as needed for diarrhea. #60 1 refill this was called to the [facility] on [address] [place]. The patient was made aware.

## 2017-01-19 ENCOUNTER — Other Ambulatory Visit (INDEPENDENT_AMBULATORY_CARE_PROVIDER_SITE_OTHER): Payer: Self-pay | Admitting: *Deleted

## 2017-01-19 DIAGNOSIS — K5732 Diverticulitis of large intestine without perforation or abscess without bleeding: Secondary | ICD-10-CM

## 2017-01-24 ENCOUNTER — Ambulatory Visit (INDEPENDENT_AMBULATORY_CARE_PROVIDER_SITE_OTHER): Payer: Self-pay | Admitting: Internal Medicine

## 2017-01-24 ENCOUNTER — Telehealth (INDEPENDENT_AMBULATORY_CARE_PROVIDER_SITE_OTHER): Payer: Self-pay | Admitting: *Deleted

## 2017-01-24 ENCOUNTER — Other Ambulatory Visit (INDEPENDENT_AMBULATORY_CARE_PROVIDER_SITE_OTHER): Payer: Self-pay | Admitting: Internal Medicine

## 2017-01-24 NOTE — Telephone Encounter (Signed)
Patient and her husband have called the office. Patient continues to have nausea , diarrhea and is unable to eat. Per her husband this has been this way since patient left the hospital. Patient does not fel that she will be able to have lab work today or keep office visit tomorrow.  Per Dr.Rehman the patient is to stop the antibiotics and take the Zofran on schedule. Patient was called and made aware.

## 2017-01-25 ENCOUNTER — Ambulatory Visit (INDEPENDENT_AMBULATORY_CARE_PROVIDER_SITE_OTHER): Payer: Medicare Other | Admitting: Internal Medicine

## 2017-01-25 ENCOUNTER — Encounter (INDEPENDENT_AMBULATORY_CARE_PROVIDER_SITE_OTHER): Payer: Self-pay | Admitting: Internal Medicine

## 2017-01-25 ENCOUNTER — Encounter (INDEPENDENT_AMBULATORY_CARE_PROVIDER_SITE_OTHER): Payer: Self-pay | Admitting: *Deleted

## 2017-01-25 VITALS — BP 128/58 | HR 60 | Temp 98.2°F | Resp 18 | Ht 64.0 in | Wt 175.3 lb

## 2017-01-25 DIAGNOSIS — Z8719 Personal history of other diseases of the digestive system: Secondary | ICD-10-CM | POA: Diagnosis not present

## 2017-01-25 DIAGNOSIS — R748 Abnormal levels of other serum enzymes: Secondary | ICD-10-CM

## 2017-01-25 DIAGNOSIS — D649 Anemia, unspecified: Secondary | ICD-10-CM | POA: Diagnosis not present

## 2017-01-25 DIAGNOSIS — K5732 Diverticulitis of large intestine without perforation or abscess without bleeding: Secondary | ICD-10-CM | POA: Diagnosis not present

## 2017-01-25 NOTE — Patient Instructions (Signed)
Physician will call with results of blood test been completed. Abdominopelvic CT to be scheduled in 4 weeks for follow-up of diverticulitis.

## 2017-01-25 NOTE — Progress Notes (Signed)
Presenting complaint;  Follow-up for diverticulitis; recent hospitalization.  Database and subjective:  Lindsay Palmer is 76 year old Caucasian female who [facility] and discharged on 01/13/2017 for sigmoid diverticulitis.  CT raised the possibility of small abscess.  Patient responded to IV antibiotics and she was discharged on Cipro and metronidazole.  Patient called the office yesterday stating that she had diarrhea ever since and has been nauseated and also vomited yesterday morning.  Patient was advised to discontinue antibiotics. Recent hospitalization was also pertinent for drop in her H&H without evidence of bleeding.  Anemia was felt to be due to acute illness.  Similarly alkaline phosphatase increased to almost 3 times normal but transaminases were normal.  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  CT did not show dilated biliary system or other liver abnormalities.  I felt bump in alkaline phosphatase was due to bowel source.  Antimitochondrial antibody was negative.   This afternoon she is accompanied by her husband Mellody DanceKeith.  She feels much better.  She only has had one loose stool today.  She had mild nausea and responded to ondansetron.  She feels her appetite is coming back.  She denies rectal bleeding fever chills or abdominal pain.  She has lost 7 pounds in the last 10 weeks. She is using furosemide on as-needed basis.   Current Medications: Outpatient Encounter Medications as of 01/25/2017  Medication Sig  . acetaminophen (TYLENOL) 500 MG tablet Take 500 mg by mouth daily as needed for moderate pain or headache.  Marland Kitchen. amLODipine (NORVASC) 5 MG tablet Take 7.5 mg by mouth daily.   . ARIPiprazole (ABILIFY) 10 MG tablet Take 1 tablet (10 mg total) by mouth at bedtime.  . Cholecalciferol (VITAMIN D) 400 UNITS capsule Take 400 Units by mouth daily.  . Fish Oil-Cholecalciferol (FISH OIL + D3 PO) Take 1 capsule by mouth 2  (two) times daily.   . furosemide (LASIX) 40 MG tablet Take 40 mg by mouth daily as needed.  . lamoTRIgine (LAMICTAL) 200 MG tablet Take 1 tablet (200 mg total) by mouth daily.  Marland Kitchen. losartan (COZAAR) 100 MG tablet Take 100 mg by mouth daily.  . meloxicam (MOBIC) 15 MG tablet Take 15 mg by mouth daily with supper.   . metoprolol tartrate (LOPRESSOR) 25 MG tablet Take 12.5 mg by mouth 2 (two) times daily.   Marland Kitchen. omeprazole (PRILOSEC) 20 MG capsule Take 1 capsule (20 mg total) by mouth 2 (two) times daily before a meal.  . ondansetron (ZOFRAN) 4 MG tablet TAKE 1 TABLET EVERY 8 HOURS AS NEEDED NAUSEA AND VOMITING  . OVER THE COUNTER MEDICATION Fiber well - 5 grams 1 chewable tablet twice daily.  . polyethylene glycol powder (GLYCOLAX/MIRALAX) powder TAKE 8.5 GRAMS(1/2 CAPFUL) MIXED IN FLUID AND DRANK EVERY OTHER DAY (Patient taking differently: TAKE 17 G MIXED IN FLUID AND DRINK AS NEEDED FOR CONSTIPATION)  . potassium chloride (K-DUR) 10 MEQ tablet Take 2 tablets (20 mEq total) by mouth as needed. Patient is to take this on the days she takes the Demadex.  . simvastatin (ZOCOR) 20 MG tablet Take 20 mg by mouth every evening.   . torsemide (DEMADEX) 20 MG tablet TAKE 1 TABLET BY MOUTH DAILY AS NEEDED FOR SWELLING  . vitamin B-12 (CYANOCOBALAMIN) 1000 MCG tablet Take 1,000 mcg by mouth daily.  Marland Kitchen. zolpidem (AMBIEN) 10 MG tablet Take 1 tablet (10 mg total) by mouth at bedtime.  . ciprofloxacin (CIPRO)  500 MG tablet Take 1 tablet (500 mg total) by mouth 2 (two) times daily for 14 days. (Patient not taking: Reported on 01/25/2017)  . metroNIDAZOLE (FLAGYL) 500 MG tablet Take 1 tablet (500 mg total) by mouth every 12 (twelve) hours for 14 days. (Patient not taking: Reported on 01/25/2017)  . [DISCONTINUED] dicyclomine (BENTYL) 10 MG capsule Take 1 capsule (10 mg total) by mouth 2 (two) times daily as needed for spasms. before breakfast and lunch.   No facility-administered encounter medications on file as of  01/25/2017.      Objective: Blood pressure (!) 128/58, pulse 60, temperature 98.2 F (36.8 C), temperature source Oral, resp. rate 18, height 5\' 4"  (1.626 m), weight 175 lb 4.8 oz (79.5 kg). Patient is alert and in no acute distress. Conjunctiva is pink. Sclera is nonicteric Oropharyngeal mucosa is normal. No neck masses or thyromegaly noted. Cardiac exam with regular rhythm normal S1 and S2. No murmur or gallop noted. Lungs are clear to auscultation. Abdomen is full.  Bowel sounds are normal.  On palpation abdomen is soft with mild tenderness at LLQ laterally and also in the right mid abdomen but no guarding or rebound.  No hepatomegaly or masses. 1+ pitting edema noted around ankles.  Labs/studies Results: Lab data from recent hospitalization.  AMA on 01/12/2017 was negative. LFTs on 01/12/2017 as follows Bilirubin 0.8, alkaline phosphatase 324, total protein 6.4 with albumin of 2.5.  AST 17 and ALT 18.  Assessment:  #1.  Recent hospitalization for sigmoid diverticulitis with question of small abscess.  She has been on antibiotics for total of 14 days.  Abdominal examination is benign.  Antibiotics stopped yesterday because of nausea vomiting and diarrhea.  She is feeling much better today.  She will need follow-up CT to document resolution of inflammatory process.  Once she has fully recovered she will undergo colonoscopy.  #2.  Elevated serum alkaline phosphatase with normal transaminases. No evidence of biliary dilation on recent CT.  AMA negative.  Rise in alkaline phosphatase possibly from bowel source.  #3.  Anemia.  No evidence of GI bleed.  Will repeat CBC.  #4.  Nausea vomiting and diarrhea secondary to antibiotics.  Symptomatic improvement on stopping antibiotics.   Plan:  Patient advised to use meloxicam on as-needed basis and she could try 7.5 mg rather than 15 mg each time. Patient will go to the lab for CBC and LFTs. Abdominal pelvic CT with contrast in 4  weeks. Office visit in 3 months.

## 2017-01-26 LAB — CBC
HEMATOCRIT: 35.1 % (ref 35.0–45.0)
HEMOGLOBIN: 11.7 g/dL (ref 11.7–15.5)
MCH: 28.1 pg (ref 27.0–33.0)
MCHC: 33.3 g/dL (ref 32.0–36.0)
MCV: 84.4 fL (ref 80.0–100.0)
MPV: 10.2 fL (ref 7.5–12.5)
Platelets: 359 10*3/uL (ref 140–400)
RBC: 4.16 10*6/uL (ref 3.80–5.10)
RDW: 14.3 % (ref 11.0–15.0)
WBC: 6.4 10*3/uL (ref 3.8–10.8)

## 2017-01-26 LAB — HEPATIC FUNCTION PANEL
AG RATIO: 1.4 (calc) (ref 1.0–2.5)
ALKALINE PHOSPHATASE (APISO): 122 U/L (ref 33–130)
ALT: 11 U/L (ref 6–29)
AST: 18 U/L (ref 10–35)
Albumin: 3.8 g/dL (ref 3.6–5.1)
BILIRUBIN DIRECT: 0.1 mg/dL (ref 0.0–0.2)
BILIRUBIN INDIRECT: 0.2 mg/dL (ref 0.2–1.2)
BILIRUBIN TOTAL: 0.3 mg/dL (ref 0.2–1.2)
Globulin: 2.8 g/dL (calc) (ref 1.9–3.7)
TOTAL PROTEIN: 6.6 g/dL (ref 6.1–8.1)

## 2017-02-06 ENCOUNTER — Ambulatory Visit (HOSPITAL_COMMUNITY): Payer: Self-pay | Admitting: Psychiatry

## 2017-02-15 ENCOUNTER — Ambulatory Visit (HOSPITAL_COMMUNITY)
Admission: RE | Admit: 2017-02-15 | Discharge: 2017-02-15 | Disposition: A | Discharge status: Home / Self Care | Payer: Medicare Other | Source: Ambulatory Visit | Attending: Internal Medicine | Admitting: Internal Medicine

## 2017-02-15 ENCOUNTER — Other Ambulatory Visit (INDEPENDENT_AMBULATORY_CARE_PROVIDER_SITE_OTHER): Payer: Self-pay | Admitting: Internal Medicine

## 2017-02-15 DIAGNOSIS — K5732 Diverticulitis of large intestine without perforation or abscess without bleeding: Secondary | ICD-10-CM | POA: Diagnosis present

## 2017-02-15 MED ORDER — IOPAMIDOL (ISOVUE-300) INJECTION 61%
100.0000 mL | Freq: Once | INTRAVENOUS | Status: AC | PRN
  Administered 2017-02-15: 100 mL via INTRAVENOUS

## 2017-02-15 MED ORDER — AMOXICILLIN-POT CLAVULANATE 875-125 MG PO TABS: 1.0000 | ORAL_TABLET | Freq: Two times a day (BID) | ORAL | 0 refills | Status: DC

## 2017-02-16 NOTE — Progress Notes (Signed)
A staff message was sent to Lindsay Palmer to find a spot for this patient to come in to see Dr. Karilyn Cotaehman in one week.

## 2017-02-19 ENCOUNTER — Ambulatory Visit (HOSPITAL_COMMUNITY): Payer: Medicare Other

## 2017-02-20 ENCOUNTER — Ambulatory Visit (HOSPITAL_COMMUNITY): Payer: Self-pay | Admitting: Psychiatry

## 2017-02-23 ENCOUNTER — Ambulatory Visit (INDEPENDENT_AMBULATORY_CARE_PROVIDER_SITE_OTHER): Payer: Self-pay | Admitting: Internal Medicine

## 2017-02-26 ENCOUNTER — Encounter (INDEPENDENT_AMBULATORY_CARE_PROVIDER_SITE_OTHER): Payer: Self-pay | Admitting: Internal Medicine

## 2017-02-26 ENCOUNTER — Ambulatory Visit (INDEPENDENT_AMBULATORY_CARE_PROVIDER_SITE_OTHER): Payer: Medicare Other | Admitting: Internal Medicine

## 2017-02-26 VITALS — BP 142/64 | HR 66 | Temp 98.0°F | Resp 18 | Ht 64.0 in | Wt 171.0 lb

## 2017-02-26 DIAGNOSIS — K5732 Diverticulitis of large intestine without perforation or abscess without bleeding: Secondary | ICD-10-CM | POA: Diagnosis not present

## 2017-02-26 MED ORDER — AMOXICILLIN-POT CLAVULANATE 875-125 MG PO TABS: 1.0000 | ORAL_TABLET | Freq: Two times a day (BID) | ORAL | 0 refills | Status: DC

## 2017-02-26 NOTE — Patient Instructions (Addendum)
Surgical consultation with Dr. Loura BackMark Palmer.  His office will contact you. Continue antibiotic as before. Notify if you have temp greater than 102 F.

## 2017-02-26 NOTE — Progress Notes (Signed)
Presenting complaint;  Follow-up for sigmoid diverticulitis.  Database and subjective:  Lindsay Palmer is 77 year old Caucasian female who was hospitalized at [facility] on 01/10/2017 and discharged on 01/13/2017 for sigmoid diverticulitis.  CT raised the possibility of small abscess.  Patient responded to IV antibiotics and she was discharged on Cipro and metronidazole.  Patient called the office yesterday stating that she had diarrhea ever since and has been nauseated and also vomited yesterday morning.  Patient was advised to discontinue antibiotics. Recent hospitalization was also pertinent for drop in her H&H without evidence of bleeding.  Anemia was felt to be due to acute illness.  Similarly alkaline phosphatase increased to almost 3 times normal but transaminases were normal.  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  CT did not show dilated biliary system or other liver abnormalities.  I felt bump in alkaline phosphatase was due to bowel source.  Antimitochondrial antibody was negative. Patient was last seen on 01/25/2017 and reported feeling much better. She had stop antibiotic 1 day prior to her visit because of nausea vomiting and diarrhea.  The symptoms resolved on stopping the medication.   Patient was to have follow-up CT diverts and of January prior to office visit. Call the office week before last complaining of recurrent abdominal pain nausea and lower abdominal pain.  Abdominopelvic CT was obtained on 02/15/2017 which revealed persistent pericolonic stranding with small air-filled cavity.  The study also showed 2.4 cm partially calcified pancreatic pseudocyst related to remote pancreatitis.  She was noted to have a long appendix extending to the lower mid abdomen and was air-filled but no wall thickening. With these findings patient was begun on Augmentin and this visit was arranged for today.  Patient is here today with her son Lindsay Palmer. He does not  feel any better.  She remains with lower abdominal pain nausea anorexia and low-grade fever.  Yesterday her temp was 100.1.  Her stools are loose but she is having bowel movement daily or every she is not taking polyethylene glycol.  She denies melena or rectal bleeding.  She has lost 11 pounds since her illness began about 6 weeks ago. She denies vomiting.  She is taking ondansetron 2-3 times a week and pain medication on as-needed basis but not daily.   Current Medications: Outpatient Encounter Medications as of 02/26/2017  Medication Sig  . acetaminophen (TYLENOL) 500 MG tablet Take 500 mg by mouth daily as needed for moderate pain or headache.  Marland Kitchen amLODipine (NORVASC) 5 MG tablet Take 7.5 mg by mouth daily.   Marland Kitchen amoxicillin-clavulanate (AUGMENTIN) 875-125 MG tablet Take 1 tablet by mouth 2 (two) times daily.  . ARIPiprazole (ABILIFY) 10 MG tablet Take 1 tablet (10 mg total) by mouth at bedtime.  . Cholecalciferol (VITAMIN D) 400 UNITS capsule Take 400 Units by mouth daily.  . Fish Oil-Cholecalciferol (FISH OIL + D3 PO) Take 1 capsule by mouth 2 (two) times daily.   . furosemide (LASIX) 40 MG tablet Take 40 mg by mouth daily as needed.  . lamoTRIgine (LAMICTAL) 200 MG tablet Take 1 tablet (200 mg total) by mouth daily.  Marland Kitchen losartan (COZAAR) 100 MG tablet Take 100 mg by mouth daily.  . meloxicam (MOBIC) 15 MG tablet Take 1 tablet (15 mg total) by mouth daily as needed for pain.  . metoprolol tartrate (LOPRESSOR) 25 MG tablet Take 12.5 mg by mouth 2 (two) times daily.   Marland Kitchen omeprazole (PRILOSEC) 20 MG capsule Take  1 capsule (20 mg total) by mouth 2 (two) times daily before a meal.  . ondansetron (ZOFRAN) 4 MG tablet TAKE 1 TABLET EVERY 8 HOURS AS NEEDED NAUSEA AND VOMITING  . OVER THE COUNTER MEDICATION Fiber well - 5 grams 1 chewable tablet twice daily.  . polyethylene glycol powder (GLYCOLAX/MIRALAX) powder TAKE 8.5 GRAMS(1/2 CAPFUL) MIXED IN FLUID AND DRANK EVERY OTHER DAY (Patient taking  differently: TAKE 17 G MIXED IN FLUID AND DRINK AS NEEDED FOR CONSTIPATION)  . potassium chloride (K-DUR) 10 MEQ tablet Take 2 tablets (20 mEq total) by mouth as needed. Patient is to take this on the days she takes the Demadex.  . simvastatin (ZOCOR) 20 MG tablet Take 20 mg by mouth every evening.   . torsemide (DEMADEX) 20 MG tablet TAKE 1 TABLET BY MOUTH DAILY AS NEEDED FOR SWELLING  . vitamin B-12 (CYANOCOBALAMIN) 1000 MCG tablet Take 1,000 mcg by mouth daily.  Marland Kitchen. zolpidem (AMBIEN) 10 MG tablet Take 1 tablet (10 mg total) by mouth at bedtime.  . [DISCONTINUED] amoxicillin-clavulanate (AUGMENTIN) 875-125 MG tablet Take 1 tablet by mouth 2 (two) times daily.   No facility-administered encounter medications on file as of 02/26/2017.     Objective: Blood pressure (!) 142/64, pulse 66, temperature 98 F (36.7 C), temperature source Oral, resp. rate 18, height 5\' 4"  (1.626 m), weight 171 lb (77.6 kg). Patient is alert and in no acute distress. Conjunctiva is pink. Sclera is nonicteric Oropharyngeal mucosa is normal. No neck masses or thyromegaly noted. Cardiac exam with regular rhythm normal S1 and S2. No murmur or gallop noted. Lungs are clear to auscultation. Abdomen is full.  Bowel sounds are normal.  On palpation abdomen is soft.  She has mild tenderness below the right costal margin.  She has mild tenderness in hypogastric region as well as LLQ but no guarding or rebound. No LE edema or clubbing noted.  Labs/studies Results:  Lab data from 01/25/2017 Bilirubin 0.3, AP 122, AST 18, ALT 11 total protein 6.6 and albumin 3.8.  WBC 6.4, H&H 11.7 and 35.1 and platelet count 359K.  C. difficile antigen and toxin were negative.  Assessment:  #1.  Sigmoid diverticulitis.  She was briefly hospitalized about 6 weeks ago.  She did not respond to first course of antibiotics.  She had a follow-up CT 11 days ago which revealed persistent changes of sigmoid colon diverticulitis and now she has  been on Augmentin for 10 days but still symptomatic.  He is not responding to current antibiotic therapy.  Therefore she may need surgical intervention.  Patient is not acutely ill to consider readmission.  I do not believe diagnostic colonoscopy at this time would be appropriate.  #2.  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   Plan:  Patient will continue Augmentin at current dose.  Numerous crypts and sent to her pharmacy. Consultation with Dr. Loura BackMark Byrd of [place]. Patient to pick up CT films(DVD) from the hospital that she will carry with her. Office visit in 3 months.

## 2017-02-27 ENCOUNTER — Telehealth (INDEPENDENT_AMBULATORY_CARE_PROVIDER_SITE_OTHER): Payer: Self-pay | Admitting: *Deleted

## 2017-02-27 NOTE — Telephone Encounter (Signed)
Call returned. Patient needs sigmoid colon resection because she is not responding to antibiotics. Has long appendix and hopefully it can be removed at the time of surgery unless too complicated

## 2017-02-27 NOTE — Telephone Encounter (Signed)
Patient's husband Fayrene FearingJames called stating Patient went to the drug store for the antibiotic and it was not called in. Patient's husband would like for Tammy to call him regarding the appointment yesterday, his wife is confused about what Dr Karilyn Cotaehman told her and he didn't get to come with her to the appointment yesterday. Please advise   [phone].

## 2017-02-27 NOTE — Telephone Encounter (Signed)
Dr.Rehman made aware and he is going to talk with the patient's husband.

## 2017-02-28 ENCOUNTER — Ambulatory Visit (INDEPENDENT_AMBULATORY_CARE_PROVIDER_SITE_OTHER): Payer: Self-pay | Admitting: Internal Medicine

## 2017-02-28 ENCOUNTER — Encounter (HOSPITAL_COMMUNITY): Payer: Self-pay | Admitting: Psychiatry

## 2017-02-28 ENCOUNTER — Ambulatory Visit (INDEPENDENT_AMBULATORY_CARE_PROVIDER_SITE_OTHER): Payer: Medicare Other | Admitting: Psychiatry

## 2017-02-28 VITALS — BP 166/66 | HR 75 | Ht 64.0 in | Wt 170.0 lb

## 2017-02-28 DIAGNOSIS — R109 Unspecified abdominal pain: Secondary | ICD-10-CM | POA: Diagnosis not present

## 2017-02-28 DIAGNOSIS — Z818 Family history of other mental and behavioral disorders: Secondary | ICD-10-CM

## 2017-02-28 DIAGNOSIS — F3130 Bipolar disorder, current episode depressed, mild or moderate severity, unspecified: Secondary | ICD-10-CM

## 2017-02-28 DIAGNOSIS — K5900 Constipation, unspecified: Secondary | ICD-10-CM | POA: Diagnosis not present

## 2017-02-28 DIAGNOSIS — R110 Nausea: Secondary | ICD-10-CM | POA: Diagnosis not present

## 2017-02-28 NOTE — Progress Notes (Signed)
BH MD/PA/NP OP Progress Note  02/28/2017 1:48 PM Lindsay Palmer  MRN:  [id number]  Chief Complaint:  Chief Complaint    Depression; Anxiety; Follow-up     HPI: This patient is a 77 year old married white female who lives with her husband in [place]. She has a son, daughter and 3 grandchildren. She was working in [employer] prior to her retirement.  The patient was referred by [facility] where she had been receiving treatment. Her physician recently left the practice and they are no longer taking her insurance. She has a long-term history of bipolar disorder.  The patient presents with her husband today. They have been married since she was 77 years old. He states that for much of their marriage she had bouts of severe mood swings. At times she was pleasant and easy to be with the other time she was angry irritable and out of control. She also went through periods of euphoria and spending a lot of money. At one point they went to a family counselor who suspected she had bipolar disorder. She was eventually referred here to this office and saw Dr. Lolly Mustache. He started her on Abilify and it really helped in terms of decreasing the anger and irritability. Eventually however she started seeing Dr. Tomasa Rand at [facility] because she was still having difficulties with being depressed. He added Lamictal and this seemed to stabilize her mood and she was no longer depressed or manic. The patient has been on this combination of medicines for several years and she seems very stable now. Her only difficulty is that she doesn't sleep well even with the addition of Ambien 10 mg at bedtime.  At times the patient sleeps up to 5 hours at night and sometimes only 2 hours. She's tried almost every medication there is for sleep. Her mood is generally good. However she is often tired in her energy is variable. She had a history of pancreatitis last year after having ERCP and the  contrast dye caused the pancreatitis. She still has bouts of nausea and weakness. She's been followed by GI in her laboratories are normal. Her husband thinks she spends too much time in bed. She spends most of her time doing housework. She is stressed because her son and her husband are not getting along . her son is on disability due to a back injury but is trying to work doing Aeronautical engineer which worries her  The patient has never had a psychiatric hospitalization. She does not use drugs or alcohol and has never had any psychotic symptoms such as auditory or visual hallucinations or paranoia  Husband return after 3 months.  The patient has been having a lot of trouble with GI problems.  She was admitted last month to the hospital for diverticulitis.  She is been on several antibiotics but it is not clearing up.  Her GI specialist now wants to send her to a surgeon as she may have to have part of her colon resected.  She is scheduled to see the surgeon in 3 weeks.  She still has no appetite and feels sick every time she eats.  Since his latest bout started she is lost about 14 pounds.  She has little energy but she is fairly bright today and is very nicely dressed and responsive.  She denies depressive or manic symptoms and is sleeping well with the Ambien Visit Diagnosis:    ICD-10-CM   1. Bipolar I disorder, most recent episode depressed (HCC)  F31.30     Past Psychiatric History: Long-term outpatient treatment for bipolar disorder  Past Medical History:  Past Medical History:  Diagnosis Date  . Bipolar 2 disorder (HCC)   . Chronic insomnia   . Diverticulosis of colon   . Elevated LFTs   . Fatty liver disease, nonalcoholic   . GERD (gastroesophageal reflux disease)   . H/O first degree atrioventricular block   . Hypercholesteremia   . Hypertension   . Major depressive disorder   . Migraine   . Osteopenia   . Pancreatitis   . Sleep apnea    Stop Bang score of 4. Pt said she had sleep  apnea at one time, but she has had another sleep study since then and they said she does not have it anymore.  . Vitamin D deficiency     Past Surgical History:  Procedure Laterality Date  . BILIARY STENT PLACEMENT N/A 07/18/2013   Procedure: BILIARY STENT PLACEMENT;  Surgeon: Malissa HippoNajeeb U Rehman, MD;  Location: [facility];  Service: Endoscopy;  Laterality: N/A;  . Cataract Surgery Bilateral 08-2012  . CHOLECYSTECTOMY    . COLONOSCOPY  2003   Diverticulitis h/o polyps -due next 2015 -gastroscopy 2003  . ERCP    . ERCP N/A 07/18/2013   Procedure: ENDOSCOPIC RETROGRADE CHOLANGIOPANCREATOGRAPHY (ERCP) COMMON BILE DUCT BRUSHING;  Surgeon: Malissa HippoNajeeb U Rehman, MD;  Location: [facility];  Service: Endoscopy;  Laterality: N/A;  . ERCP N/A 11/07/2013   Procedure: ENDOSCOPIC RETROGRADE CHOLANGIOPANCREATOGRAPHY (ERCP);  Surgeon: Malissa HippoNajeeb U Rehman, MD;  Location: [facility];  Service: Endoscopy;  Laterality: N/A;  . ESOPHAGEAL DILATION N/A 08/23/2016   Procedure: ESOPHAGEAL DILATION;  Surgeon: Malissa Hippoehman, Najeeb U, MD;  Location[facility];  Service: Endoscopy;  Laterality: N/A;  . ESOPHAGOGASTRODUODENOSCOPY N/A 08/23/2016   Procedure: ESOPHAGOGASTRODUODENOSCOPY (EGD);  Surgeon: Malissa Hippoehman, Najeeb U, MD;  Location: [facility];  Service: Endoscopy;  Laterality: N/A;  245-moved up to 100 per Dewayne HatchAnn  . EYE SURGERY Bilateral    cataract removal  . NECK SURGERY     Stenosis C6-C7  . SIGMOIDOSCOPY  2007   no polyps  . SPHINCTEROTOMY N/A 07/18/2013   Procedure: SPHINCTEROTOMY;  Surgeon: Malissa HippoNajeeb U Rehman, MD;  Location: [facility];  Service: Endoscopy;  Laterality: N/A;  . SPHINCTEROTOMY  11/07/2013   Procedure: EXTENSION OF SPHINCTEROTOMY;  Surgeon: Malissa HippoNajeeb U Rehman, MD;  Location: [facility];  Service: Endoscopy;;  . STENT REMOVAL N/A 11/07/2013   Procedure: STENT REMOVAL;  Surgeon: Malissa HippoNajeeb U Rehman, MD;  Location: [facility];  Service: Endoscopy;  Laterality: N/A;    Family Psychiatric History: See below  Family History:  Family History   Problem Relation Age of Onset  . Diabetes Mother   . Coronary artery disease Father   . Gout Father   . Breast cancer Sister   . Pancreatic cancer Brother   . Alcohol abuse Brother   . Healthy Brother   . Lung cancer Sister   . Healthy Sister   . Depression Sister   . Hyperlipidemia Daughter   . Hyperlipidemia Son   . Bipolar disorder Son   . Healthy Sister   . Healthy Sister   . Seizures Other     Social History:  Social History   Socioeconomic History  . Marital status: Married    Spouse name: None  . Number of children: None  . Years of education: None  . Highest education level: None  Social Needs  . Financial resource strain: None  . Food  insecurity - worry: None  . Food insecurity - inability: None  . Transportation needs - medical: None  . Transportation needs - non-medical: None  Occupational History  . None  Tobacco Use  . Smoking status: Never Smoker  . Smokeless tobacco: Never Used  Substance and Sexual Activity  . Alcohol use: No    Alcohol/week: 0.0 oz  . Drug use: No  . Sexual activity: None  Other Topics Concern  . None  Social History Narrative  . None    Allergies:  Allergies  Allergen Reactions  . Codeine Nausea And Vomiting  . Ciprofloxacin     Nausea and Vomiting  . Flagyl [Metronidazole]     Nausea and vomiting  . Oxycodone Nausea And Vomiting    Metabolic Disorder Labs: No results found for: HGBA1C, MPG No results found for: PROLACTIN Lab Results  Component Value Date   TRIG 113 07/28/2013   Lab Results  Component Value Date   TSH 1.213 09/14/2014    Therapeutic Level Labs: No results found for: LITHIUM No results found for: VALPROATE No components found for:  CBMZ  Current Medications: Current Outpatient Medications  Medication Sig Dispense Refill  . acetaminophen (TYLENOL) 500 MG tablet Take 500 mg by mouth daily as needed for moderate pain or headache.    Marland Kitchen amLODipine (NORVASC) 5 MG tablet Take 7.5 mg by  mouth daily.     Marland Kitchen amoxicillin-clavulanate (AUGMENTIN) 875-125 MG tablet Take 1 tablet by mouth 2 (two) times daily. 28 tablet 0  . ARIPiprazole (ABILIFY) 10 MG tablet Take 1 tablet (10 mg total) by mouth at bedtime. 100 tablet 2  . Cholecalciferol (VITAMIN D) 400 UNITS capsule Take 400 Units by mouth daily.    . Fish Oil-Cholecalciferol (FISH OIL + D3 PO) Take 1 capsule by mouth 2 (two) times daily.     . furosemide (LASIX) 40 MG tablet Take 40 mg by mouth daily as needed.    Marland Kitchen HYDROcodone-acetaminophen (NORCO/VICODIN) 5-325 MG tablet Take 1 tablet by mouth 2 (two) times daily as needed for moderate pain.    Marland Kitchen lamoTRIgine (LAMICTAL) 200 MG tablet Take 1 tablet (200 mg total) by mouth daily. 90 tablet 2  . losartan (COZAAR) 100 MG tablet Take 100 mg by mouth daily.    . meloxicam (MOBIC) 15 MG tablet Take 1 tablet (15 mg total) by mouth daily as needed for pain.    . metoprolol tartrate (LOPRESSOR) 25 MG tablet Take 12.5 mg by mouth 2 (two) times daily.     Marland Kitchen omeprazole (PRILOSEC) 20 MG capsule Take 1 capsule (20 mg total) by mouth 2 (two) times daily before a meal. 180 capsule 3  . ondansetron (ZOFRAN) 4 MG tablet TAKE 1 TABLET EVERY 8 HOURS AS NEEDED NAUSEA AND VOMITING 30 tablet 2  . OVER THE COUNTER MEDICATION Fiber well - 5 grams 1 chewable tablet twice daily.    . polyethylene glycol powder (GLYCOLAX/MIRALAX) powder TAKE 8.5 GRAMS(1/2 CAPFUL) MIXED IN FLUID AND DRANK EVERY OTHER DAY (Patient taking differently: TAKE 17 G MIXED IN FLUID AND DRINK AS NEEDED FOR CONSTIPATION) 1581 g 5  . potassium chloride (K-DUR) 10 MEQ tablet Take 2 tablets (20 mEq total) by mouth as needed. Patient is to take this on the days she takes the Demadex. 30 tablet 3  . simvastatin (ZOCOR) 20 MG tablet Take 20 mg by mouth every evening.     . torsemide (DEMADEX) 20 MG tablet TAKE 1 TABLET BY MOUTH DAILY AS NEEDED FOR  SWELLING 30 tablet 3  . vitamin B-12 (CYANOCOBALAMIN) 1000 MCG tablet Take 1,000 mcg by mouth daily.     Marland Kitchen zolpidem (AMBIEN) 10 MG tablet Take 1 tablet (10 mg total) by mouth at bedtime. 90 tablet 2   No current facility-administered medications for this visit.      Musculoskeletal: Strength & Muscle Tone: within normal limits Gait & Station: normal Patient leans: N/A  Psychiatric Specialty Exam: Review of Systems  Constitutional: Positive for malaise/fatigue.  Gastrointestinal: Positive for abdominal pain, constipation and nausea.  All other systems reviewed and are negative.   Blood pressure (!) 166/66, pulse 75, height 5\' 4"  (1.626 m), weight 170 lb (77.1 kg), SpO2 97 %.Body mass index is 29.18 kg/m.  General Appearance: Casual, Neat and Well Groomed  Eye Contact:  Good  Speech:  Clear and Coherent  Volume:  Normal  Mood:  Anxious  Affect:  Congruent  Thought Process:  Goal Directed  Orientation:  Full (Time, Place, and Person)  Thought Content: WDL   Suicidal Thoughts:  No  Homicidal Thoughts:  No  Memory:  Immediate;   Good Recent;   Good Remote;   Fair  Judgement:  Fair  Insight:  Fair  Psychomotor Activity:  Normal  Concentration:  Concentration: Good and Attention Span: Good  Recall:  Good  Fund of Knowledge: Good  Language: Good  Akathisia:  No  Handed:  Right  AIMS (if indicated): not done  Assets:  Communication Skills Desire for Improvement Resilience Social Support Talents/Skills  ADL's:  Intact  Cognition: WNL  Sleep:  Good   Screenings:   Assessment and Plan: This patient is a 77 year old female with a history of bipolar disorder.  In terms of her psychiatric symptoms she is doing well.  She will continue Ambien 10 mg at bedtime for sleep, Lamictal 200 mg daily for mood stabilization and Abilify 10 mg daily also for mood stabilization.  She will return to see me in 3 months   Diannia Ruder, MD 02/28/2017, 1:48 PM

## 2017-03-15 ENCOUNTER — Telehealth (INDEPENDENT_AMBULATORY_CARE_PROVIDER_SITE_OTHER): Payer: Self-pay | Admitting: *Deleted

## 2017-03-15 NOTE — Telephone Encounter (Signed)
Fayrene FearingJames called for the patient stating her plan with not let her get the omeprazole 20mg  90 day supply twice daily. Per Margreta JourneyJames Tammy will have to call and get an exemption   Per Fayrene FearingJames the insurance will only give a 30 day supply

## 2017-03-16 NOTE — Telephone Encounter (Signed)
I have a PA to do for the patient . Once it is completed , I will talk with the patient or her husband. I do believe that her Insurance has changed in regard to the 30/30 verses the 90 day supply.

## 2017-03-19 ENCOUNTER — Other Ambulatory Visit (INDEPENDENT_AMBULATORY_CARE_PROVIDER_SITE_OTHER): Payer: Self-pay | Admitting: Internal Medicine

## 2017-03-19 MED ORDER — HYDROCODONE-ACETAMINOPHEN 5-325 MG PO TABS: 1.0000 | ORAL_TABLET | Freq: Four times a day (QID) | ORAL | 0 refills | Status: DC | PRN

## 2017-03-19 NOTE — Telephone Encounter (Signed)
Talked with patient. She did not notice any improvement with Augmentin.  Previously she had been on Cipro and metronidazole. She remains with lower abdominal pain and low-grade fever. She had an appointment to see Dr. Colette RibasByrd of surgery last week but she was not able to do so.  She now has an appointment next week. Prescription for pain medication filled. Vision advised to report to emergency room if temp more than 101 or pain not controlled with pain medication

## 2017-03-19 NOTE — Telephone Encounter (Signed)
Forwarded to Dr.Rehman. 

## 2017-03-19 NOTE — Telephone Encounter (Signed)
Patient's husband called stating the patient has nausea, vomiting, fever of 99.1-100.1. Patient is not scheduled to see the surgeon on 2/27 patient missed the 2/11 due to being sick. Patient is out of antibiotic and  Pain medication. Husband is requesting for these to be refilled. Per husband the patient cannot eat. Please advise

## 2017-03-22 ENCOUNTER — Telehealth (INDEPENDENT_AMBULATORY_CARE_PROVIDER_SITE_OTHER): Payer: Self-pay | Admitting: *Deleted

## 2017-03-22 ENCOUNTER — Telehealth (INDEPENDENT_AMBULATORY_CARE_PROVIDER_SITE_OTHER): Payer: Self-pay | Admitting: Internal Medicine

## 2017-03-22 MED ORDER — HYDROCODONE-ACETAMINOPHEN 5-325 MG PO TABS: 1.0000 | ORAL_TABLET | Freq: Four times a day (QID) | ORAL | 0 refills | Status: DC | PRN

## 2017-03-22 NOTE — Telephone Encounter (Signed)
Patient's husband was called and advised that this would need to be picked up from our office and taken to the pharmacy. He will come and pick it up.

## 2017-03-22 NOTE — Telephone Encounter (Signed)
[facility] [address] called and they are unable to fill the Hydrocodone due to not having it in stock.They [facility] [address], they have the medication. They ask that we send the RX to the [facility] location. Then we are to let them know so they can delete the prescription.   Talked with Camelia Engerri , she will do the Rx for Hydrocodone 5/325 mg -Patient is to take 1 tablet by mouth every 6 hours as needed for moderate pain. #28 no refills.  [facility] [address] [facility] [place] ,[place].

## 2017-03-22 NOTE — Telephone Encounter (Signed)
[facility]

## 2017-03-27 NOTE — Telephone Encounter (Signed)
err

## 2017-04-02 ENCOUNTER — Other Ambulatory Visit (INDEPENDENT_AMBULATORY_CARE_PROVIDER_SITE_OTHER): Payer: Self-pay | Admitting: *Deleted

## 2017-04-02 DIAGNOSIS — R110 Nausea: Secondary | ICD-10-CM

## 2017-04-02 MED ORDER — ONDANSETRON HCL 8 MG PO TABS: 8.0000 mg | ORAL_TABLET | Freq: Two times a day (BID) | ORAL | 2 refills | Status: DC

## 2017-05-08 ENCOUNTER — Ambulatory Visit (INDEPENDENT_AMBULATORY_CARE_PROVIDER_SITE_OTHER): Payer: Self-pay | Admitting: Internal Medicine

## 2017-05-24 ENCOUNTER — Other Ambulatory Visit (HOSPITAL_COMMUNITY): Payer: Self-pay | Admitting: Psychiatry

## 2017-05-30 ENCOUNTER — Ambulatory Visit (HOSPITAL_COMMUNITY): Payer: Medicare Other | Admitting: Psychiatry

## 2017-06-12 ENCOUNTER — Ambulatory Visit (INDEPENDENT_AMBULATORY_CARE_PROVIDER_SITE_OTHER): Payer: Self-pay | Admitting: Internal Medicine

## 2017-06-25 ENCOUNTER — Other Ambulatory Visit (HOSPITAL_COMMUNITY): Payer: Self-pay | Admitting: Psychiatry

## 2017-06-28 ENCOUNTER — Encounter (HOSPITAL_COMMUNITY): Payer: Self-pay | Admitting: Psychiatry

## 2017-06-28 ENCOUNTER — Ambulatory Visit (INDEPENDENT_AMBULATORY_CARE_PROVIDER_SITE_OTHER): Payer: Medicare Other | Admitting: Psychiatry

## 2017-06-28 VITALS — BP 146/70 | HR 73 | Ht 64.0 in | Wt 154.0 lb

## 2017-06-28 DIAGNOSIS — G4709 Other insomnia: Secondary | ICD-10-CM

## 2017-06-28 DIAGNOSIS — F3130 Bipolar disorder, current episode depressed, mild or moderate severity, unspecified: Secondary | ICD-10-CM

## 2017-06-28 DIAGNOSIS — Z818 Family history of other mental and behavioral disorders: Secondary | ICD-10-CM | POA: Diagnosis not present

## 2017-06-28 DIAGNOSIS — Z79899 Other long term (current) drug therapy: Secondary | ICD-10-CM | POA: Diagnosis not present

## 2017-06-28 DIAGNOSIS — Z811 Family history of alcohol abuse and dependence: Secondary | ICD-10-CM | POA: Diagnosis not present

## 2017-06-28 MED ORDER — LAMOTRIGINE 200 MG PO TABS: 200.0000 mg | ORAL_TABLET | Freq: Every day | ORAL | 2 refills | Status: DC

## 2017-06-28 MED ORDER — ARIPIPRAZOLE 10 MG PO TABS: 10.0000 mg | ORAL_TABLET | Freq: Every day | ORAL | 2 refills | Status: DC

## 2017-06-28 MED ORDER — ZOLPIDEM TARTRATE 10 MG PO TABS: 10.0000 mg | ORAL_TABLET | Freq: Every day | ORAL | 2 refills | Status: DC

## 2017-06-28 NOTE — Progress Notes (Signed)
BH MD/PA/NP OP Progress Note  06/28/2017 2:05 PM Lindsay Palmer  MRN:  [id number]  Chief Complaint:  Chief Complaint    Depression; Anxiety; Follow-up     HPI: This patient is a 77 year old married white female who lives with her husband in [place]. She has a son, daughter and 3 grandchildren. She was working in [employer] prior to her retirement.  The patient was referred by [facility] where she had been receiving treatment. Her physician recently left the practice and they are no longer taking her insurance. She has a long-term history of bipolar disorder.  The patient presents with her husband today. They have been married since she was 77 years old. He states that for much of their marriage she had bouts of severe mood swings. At times she was pleasant and easy to be with the other time she was angry irritable and out of control. She also went through periods of euphoria and spending a lot of money. At one point they went to a family counselor who suspected she had bipolar disorder. She was eventually referred here to this office and saw Dr. Lolly Mustache. He started her on Abilify and it really helped in terms of decreasing the anger and irritability. Eventually however she started seeing Dr. Tomasa Rand at [facility] because she was still having difficulties with being depressed. He added Lamictal and this seemed to stabilize her mood and she was no longer depressed or manic. The patient has been on this combination of medicines for several years and she seems very stable now. Her only difficulty is that she doesn't sleep well even with the addition of Ambien 10 mg at bedtime.  At times the patient sleeps up to 5 hours at night and sometimes only 2 hours. She's tried almost every medication there is for sleep. Her mood is generally good. However she is often tired in her energy is variable. She had a history of pancreatitis last year after having ERCP and the  contrast dye caused the pancreatitis. She still has bouts of nausea and weakness. She's been followed by GI in her laboratories are normal. Her husband thinks she spends too much time in bed. She spends most of her time doing housework. She is stressed because her son and her husband are not getting along . her son is on disability due to a back injury but is trying to work doing Aeronautical engineer which worries her  The patient has never had a psychiatric hospitalization. She does not use drugs or alcohol and has never had any psychotic symptoms such as auditory or visual hallucinations or paranoia  She returns after 4 months.  She is with her husband.  They tell me that she had a colon resection for severe diverticulitis back in March.  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  She went through a bout of infection and was on antibiotics through her port for 2 weeks.  She is starting to feel better but for a long time she was unable to eat much and she has lost 20 pounds.  She is hoping at some point she can get rid of the ileostomy bag.  Despite all this her spirits have been good she denies being depressed but she does not feel like doing too much because she does not like going out with a bad due to previous leakage.  She is just now starting to eat a little bit more.  She is sleeping  well.  She denies any manic symptoms Visit Diagnosis:    ICD-10-CM   1. Bipolar I disorder, most recent episode depressed (HCC) F31.30     Past Psychiatric History: Long-term outpatient treatment for bipolar disorder  Past Medical History:  Past Medical History:  Diagnosis Date  . Bipolar 2 disorder (HCC)   . Chronic insomnia   . Diverticulosis of colon   . Elevated LFTs   . Fatty liver disease, nonalcoholic   . GERD (gastroesophageal reflux disease)   . H/O first degree atrioventricular block   . Hypercholesteremia   . Hypertension   . Major depressive disorder   . Migraine   .  Osteopenia   . Pancreatitis   . Sleep apnea    Stop Bang score of 4. Pt said she had sleep apnea at one time, but she has had another sleep study since then and they said she does not have it anymore.  . Vitamin D deficiency     Past Surgical History:  Procedure Laterality Date  . BILIARY STENT PLACEMENT N/A 07/18/2013   Procedure: BILIARY STENT PLACEMENT;  Surgeon: Malissa Hippo, MD;  Location: [facility];  Service: Endoscopy;  Laterality: N/A;  . Cataract Surgery Bilateral 08-2012  . CHOLECYSTECTOMY    . COLONOSCOPY  2003   Diverticulitis h/o polyps -due next 2015 -gastroscopy 2003  . ERCP    . ERCP N/A 07/18/2013   Procedure: ENDOSCOPIC RETROGRADE CHOLANGIOPANCREATOGRAPHY (ERCP) COMMON BILE DUCT BRUSHING;  Surgeon: Malissa Hippo, MD;  Location: [facility];  Service: Endoscopy;  Laterality: N/A;  . ERCP N/A 11/07/2013   Procedure: ENDOSCOPIC RETROGRADE CHOLANGIOPANCREATOGRAPHY (ERCP);  Surgeon: Malissa Hippo, MD;  Location: [facility];  Service: Endoscopy;  Laterality: N/A;  . ESOPHAGEAL DILATION N/A 08/23/2016   Procedure: ESOPHAGEAL DILATION;  Surgeon: Malissa Hippo, MD;  Location: [facility];  Service: Endoscopy;  Laterality: N/A;  . ESOPHAGOGASTRODUODENOSCOPY N/A 08/23/2016   Procedure: ESOPHAGOGASTRODUODENOSCOPY (EGD);  Surgeon: Malissa Hippo, MD;  Location: [facility];  Service: Endoscopy;  Laterality: N/A;  245-moved up to 100 per [name]  . EYE SURGERY Bilateral    cataract removal  . NECK SURGERY     Stenosis C6-C7  . SIGMOIDOSCOPY  2007   no polyps  . SPHINCTEROTOMY N/A 07/18/2013   Procedure: SPHINCTEROTOMY;  Surgeon: Malissa Hippo, MD;  Location: [facility];  Service: Endoscopy;  Laterality: N/A;  . SPHINCTEROTOMY  11/07/2013   Procedure: EXTENSION OF SPHINCTEROTOMY;  Surgeon: Malissa Hippo, MD;  Location: [facility];  Service: Endoscopy;;  . STENT REMOVAL N/A 11/07/2013   Procedure: STENT REMOVAL;  Surgeon: Malissa Hippo, MD;  Location: [facility];  Service: Endoscopy;   Laterality: N/A;    Family Psychiatric History: See below  Family History:  Family History  Problem Relation Age of Onset  . Diabetes Mother   . Coronary artery disease Father   . Gout Father   . Breast cancer Sister   . Pancreatic cancer Brother   . Alcohol abuse Brother   . Healthy Brother   . Lung cancer Sister   . Healthy Sister   . Depression Sister   . Hyperlipidemia Daughter   . Hyperlipidemia Son   . Bipolar disorder Son   . Healthy Sister   . Healthy Sister   . Seizures Other     Social History:  Social History   Socioeconomic History  . Marital status: Married    Spouse name: Not on file  . Number of  children: Not on file  . Years of education: Not on file  . Highest education level: Not on file  Occupational History  . Not on file  Social Needs  . Financial resource strain: Not on file  . Food insecurity:    Worry: Not on file    Inability: Not on file  . Transportation needs:    Medical: Not on file    Non-medical: Not on file  Tobacco Use  . Smoking status: Never Smoker  . Smokeless tobacco: Never Used  Substance and Sexual Activity  . Alcohol use: No    Alcohol/week: 0.0 oz  . Drug use: No  . Sexual activity: Not on file  Lifestyle  . Physical activity:    Days per week: Not on file    Minutes per session: Not on file  . Stress: Not on file  Relationships  . Social connections:    Talks on phone: Not on file    Gets together: Not on file    Attends religious service: Not on file    Active member of club or organization: Not on file    Attends meetings of clubs or organizations: Not on file    Relationship status: Not on file  Other Topics Concern  . Not on file  Social History Narrative  . Not on file    Allergies:  Allergies  Allergen Reactions  . Codeine Nausea And Vomiting  . Ciprofloxacin     Nausea and Vomiting  . Flagyl [Metronidazole]     Nausea and vomiting  . Oxycodone Nausea And Vomiting    Metabolic Disorder  Labs: No results found for: HGBA1C, MPG No results found for: PROLACTIN Lab Results  Component Value Date   TRIG 113 07/28/2013   Lab Results  Component Value Date   TSH 1.213 09/14/2014    Therapeutic Level Labs: No results found for: LITHIUM No results found for: VALPROATE No components found for:  CBMZ  Current Medications: Current Outpatient Medications  Medication Sig Dispense Refill  . acetaminophen (TYLENOL) 500 MG tablet Take 500 mg by mouth daily as needed for moderate pain or headache.    Marland Kitchen amLODipine (NORVASC) 5 MG tablet Take 7.5 mg by mouth daily.     Marland Kitchen amoxicillin-clavulanate (AUGMENTIN) 875-125 MG tablet Take 1 tablet by mouth 2 (two) times daily. 28 tablet 0  . ARIPiprazole (ABILIFY) 10 MG tablet Take 1 tablet (10 mg total) by mouth at bedtime. 100 tablet 2  . Cholecalciferol (VITAMIN D) 400 UNITS capsule Take 400 Units by mouth daily.    . Fish Oil-Cholecalciferol (FISH OIL + D3 PO) Take 1 capsule by mouth 2 (two) times daily.     . furosemide (LASIX) 40 MG tablet Take 40 mg by mouth daily as needed.    Marland Kitchen HYDROcodone-acetaminophen (NORCO/VICODIN) 5-325 MG tablet Take 1 tablet by mouth every 6 (six) hours as needed for moderate pain. 28 tablet 0  . lamoTRIgine (LAMICTAL) 200 MG tablet Take 1 tablet (200 mg total) by mouth daily. 90 tablet 2  . meloxicam (MOBIC) 15 MG tablet Take 1 tablet (15 mg total) by mouth daily as needed for pain.    . metoprolol tartrate (LOPRESSOR) 25 MG tablet Take 12.5 mg by mouth 2 (two) times daily.     Marland Kitchen omeprazole (PRILOSEC) 20 MG capsule Take 1 capsule (20 mg total) by mouth 2 (two) times daily before a meal. 180 capsule 3  . ondansetron (ZOFRAN) 4 MG tablet TAKE 1 TABLET EVERY 8  HOURS AS NEEDED NAUSEA AND VOMITING 30 tablet 2  . ondansetron (ZOFRAN) 8 MG tablet Take 1 tablet (8 mg total) by mouth 2 (two) times daily. as needed for nausea and vomiting. 30 tablet 2  . OVER THE COUNTER MEDICATION Fiber well - 5 grams 1 chewable tablet  twice daily.    . polyethylene glycol powder (GLYCOLAX/MIRALAX) powder TAKE 8.5 GRAMS(1/2 CAPFUL) MIXED IN FLUID AND DRANK EVERY OTHER DAY (Patient taking differently: TAKE 17 G MIXED IN FLUID AND DRINK AS NEEDED FOR CONSTIPATION) 1581 g 5  . potassium chloride (K-DUR) 10 MEQ tablet Take 2 tablets (20 mEq total) by mouth as needed. Patient is to take this on the days she takes the Demadex. 30 tablet 3  . simvastatin (ZOCOR) 20 MG tablet Take 20 mg by mouth every evening.     . torsemide (DEMADEX) 20 MG tablet TAKE 1 TABLET BY MOUTH DAILY AS NEEDED FOR SWELLING 30 tablet 3  . vitamin B-12 (CYANOCOBALAMIN) 1000 MCG tablet Take 1,000 mcg by mouth daily.    Marland Kitchen zolpidem (AMBIEN) 10 MG tablet Take 1 tablet (10 mg total) by mouth at bedtime. 90 tablet 2   No current facility-administered medications for this visit.      Musculoskeletal: Strength & Muscle Tone: within normal limits Gait & Station: normal Patient leans: N/A  Psychiatric Specialty Exam: Review of Systems  Constitutional: Positive for malaise/fatigue and weight loss.  All other systems reviewed and are negative.   Blood pressure (!) 146/70, pulse 73, height  (1.626 m), weight 154 lb (69.9 kg), SpO2 97 %.Body mass index is 26.43 kg/m.  General Appearance: Casual, Neat and Well Groomed  Eye Contact:  Good  Speech:  Clear and Coherent  Volume:  Normal  Mood:  Euthymic  Affect:  Congruent  Thought Process:  Goal Directed  Orientation:  Full (Time, Place, and Person)  Thought Content: Rumination   Suicidal Thoughts:  No  Homicidal Thoughts:  No  Memory:  Immediate;   Good Recent;   Good Remote;   Good  Judgement:  Fair  Insight:  Fair  Psychomotor Activity:  Decreased  Concentration:  Concentration: Good and Attention Span: Good  Recall:  Good  Fund of Knowledge: Good  Language: Good  Akathisia:  No  Handed:  Right  AIMS (if indicated): not done  Assets:  Communication Skills Desire for  Improvement Resilience Social Support Talents/Skills  ADL's:  Intact  Cognition: WNL  Sleep:  Good   Screenings:   Assessment and Plan: This patient is a 77 year old female with a history of bipolar disorder.  Despite all of her medical issues she is doing well on her current regimen.  She will continue Abilify 10 mg daily as well as Lamictal 200 mg daily for mood stabilization and Ambien 10 mg at bedtime for sleep.  She will return to see me in 4 months   Diannia Ruder, MD 06/28/2017, 2:05 PM

## 2017-07-19 ENCOUNTER — Other Ambulatory Visit (INDEPENDENT_AMBULATORY_CARE_PROVIDER_SITE_OTHER): Payer: Self-pay | Admitting: Internal Medicine

## 2017-07-19 DIAGNOSIS — K219 Gastro-esophageal reflux disease without esophagitis: Secondary | ICD-10-CM

## 2017-09-04 ENCOUNTER — Telehealth (INDEPENDENT_AMBULATORY_CARE_PROVIDER_SITE_OTHER): Payer: Self-pay | Admitting: Internal Medicine

## 2017-09-04 NOTE — Telephone Encounter (Signed)
Dr. Loura BackMark Byrd called about Lindsay Palmer. Lindsay Palmer is feeling better and he is planning to reverse her colostomy later this month. Colonoscopy would be considered towards the end of the year.

## 2017-09-25 ENCOUNTER — Other Ambulatory Visit (HOSPITAL_COMMUNITY): Payer: Self-pay | Admitting: Psychiatry

## 2017-10-29 ENCOUNTER — Ambulatory Visit (HOSPITAL_COMMUNITY): Payer: Self-pay | Admitting: Psychiatry

## 2017-11-05 ENCOUNTER — Ambulatory Visit (INDEPENDENT_AMBULATORY_CARE_PROVIDER_SITE_OTHER): Payer: Medicare Other | Admitting: Psychiatry

## 2017-11-05 ENCOUNTER — Encounter (HOSPITAL_COMMUNITY): Payer: Self-pay | Admitting: Psychiatry

## 2017-11-05 VITALS — BP 160/66 | HR 60 | Ht 64.0 in | Wt 161.0 lb

## 2017-11-05 DIAGNOSIS — F3130 Bipolar disorder, current episode depressed, mild or moderate severity, unspecified: Secondary | ICD-10-CM

## 2017-11-05 MED ORDER — LAMOTRIGINE 200 MG PO TABS: 200.0000 mg | ORAL_TABLET | Freq: Every day | ORAL | 2 refills | Status: DC

## 2017-11-05 MED ORDER — ZOLPIDEM TARTRATE 10 MG PO TABS: 10.0000 mg | ORAL_TABLET | Freq: Every day | ORAL | 2 refills | Status: DC

## 2017-11-05 MED ORDER — ARIPIPRAZOLE 10 MG PO TABS: 10.0000 mg | ORAL_TABLET | Freq: Every day | ORAL | 2 refills | Status: DC

## 2017-11-05 NOTE — Progress Notes (Signed)
BH MD/PA/NP OP Progress Note  11/05/2017 2:52 PM Lindsay Palmer  MRN:  [id number]  Chief Complaint:  Chief Complaint    Depression; Anxiety; Manic Behavior; Follow-up     HPI: This patient is a 77 year old married white female who lives with her husband in [place]. She has a son, daughter and 3 grandchildren. She was working in [employer] prior to her retirement.  The patient was referred by [facility] where she had been receiving treatment. Her physician recently left the practice and they are no longer taking her insurance. She has a long-term history of bipolar disorder.  The patient presents with her husband today. They have been married since she was 77 years old. He states that for much of their marriage she had bouts of severe mood swings. At times she was pleasant and easy to be with the other time she was angry irritable and out of control. She also went through periods of euphoria and spending a lot of money. At one point they went to a family counselor who suspected she had bipolar disorder. She was eventually referred here to this office and saw Dr. Lolly Mustache. He started her on Abilify and it really helped in terms of decreasing the anger and irritability. Eventually however she started seeing Dr. Tomasa Rand at [facility] because she was still having difficulties with being depressed. He added Lamictal and this seemed to stabilize her mood and she was no longer depressed or manic. The patient has been on this combination of medicines for several years and she seems very stable now. Her only difficulty is that she doesn't sleep well even with the addition of Ambien 10 mg at bedtime.  At times the patient sleeps up to 5 hours at night and sometimes only 2 hours. She's tried almost every medication there is for sleep. Her mood is generally good. However she is often tired in her energy is variable. She had a history of pancreatitis last year after having  ERCP and the contrast dye caused the pancreatitis. She still has bouts of nausea and weakness. She's been followed by GI in her laboratories are normal. Her husband thinks she spends too much time in bed. She spends most of her time doing housework. She is stressed because her son and her husband are not getting along . her son is on disability due to a back injury but is trying to work doing Aeronautical engineer which worries her  The patient has never had a psychiatric hospitalization. She does not use drugs or alcohol and has never had any psychotic symptoms such as auditory or visual hallucinations or paranoia  The patient returns after 6 months with her husband.  Since I last saw her she has been quite ill.  She had a previous colon resection for severe reticulitis last March.  Since then she has had it reattached.  When she came out of the hospital she contracted pneumonia and had to go back.  She is now on oxygen.  Since the reattachment she has felt awful.  She does not feel like eating and she is constantly nauseated.  She was on several antibiotics for the pneumonia which may explain some of this.  I suggested that she go on probiotics.  Her energy is low but she denies being depressed.  Hopefully she will be able to get off the oxygen.  She has regained just a few pounds.  She is sleeping well denies any manic symptoms. Visit Diagnosis:  ICD-10-CM   1. Bipolar I disorder, most recent episode depressed (HCC) F31.30     Past Psychiatric History: Long-term outpatient treatment for bipolar disorder  Past Medical History:  Past Medical History:  Diagnosis Date  . Bipolar 2 disorder (HCC)   . Chronic insomnia   . Diverticulosis of colon   . Elevated LFTs   . Fatty liver disease, nonalcoholic   . GERD (gastroesophageal reflux disease)   . H/O first degree atrioventricular block   . Hypercholesteremia   . Hypertension   . Major depressive disorder   . Migraine   . Osteopenia   . Pancreatitis    . Sleep apnea    Stop Bang score of 4. Pt said she had sleep apnea at one time, but she has had another sleep study since then and they said she does not have it anymore.  . Vitamin D deficiency     Past Surgical History:  Procedure Laterality Date  . BILIARY STENT PLACEMENT N/A 07/18/2013   Procedure: BILIARY STENT PLACEMENT;  Surgeon: Malissa Hippo, MD;  Location: [facility];  Service: Endoscopy;  Laterality: N/A;  . Cataract Surgery Bilateral 08-2012  . CHOLECYSTECTOMY    . COLONOSCOPY  2003   Diverticulitis h/o polyps -due next 2015 -gastroscopy 2003  . ERCP    . ERCP N/A 07/18/2013   Procedure: ENDOSCOPIC RETROGRADE CHOLANGIOPANCREATOGRAPHY (ERCP) COMMON BILE DUCT BRUSHING;  Surgeon: Malissa Hippo, MD;  Location: [facility];  Service: Endoscopy;  Laterality: N/A;  . ERCP N/A 11/07/2013   Procedure: ENDOSCOPIC RETROGRADE CHOLANGIOPANCREATOGRAPHY (ERCP);  Surgeon: Malissa Hippo, MD;  Location: [facility];  Service: Endoscopy;  Laterality: N/A;  . ESOPHAGEAL DILATION N/A 08/23/2016   Procedure: ESOPHAGEAL DILATION;  Surgeon: Malissa Hippo, MD;  [facility];  Service: Endoscopy;  Laterality: N/A;  . ESOPHAGOGASTRODUODENOSCOPY N/A 08/23/2016   Procedure: ESOPHAGOGASTRODUODENOSCOPY (EGD);  Surgeon: Malissa Hippo, MD;  [facility];  Service: Endoscopy;  Laterality: N/A;  245-moved up to 100 per [name]  . EYE SURGERY Bilateral    cataract removal  . NECK SURGERY     Stenosis C6-C7  . SIGMOIDOSCOPY  2007   no polyps  . SPHINCTEROTOMY N/A 07/18/2013   Procedure: SPHINCTEROTOMY;  Surgeon: Malissa Hippo, MD;  Location: [facility];  Service: Endoscopy;  Laterality: N/A;  . SPHINCTEROTOMY  11/07/2013   Procedure: EXTENSION OF SPHINCTEROTOMY;  Surgeon: Malissa Hippo, MD;  Location: [facility];  Service: Endoscopy;;  . STENT REMOVAL N/A 11/07/2013   Procedure: STENT REMOVAL;  Surgeon: Malissa Hippo, MD;  Location: [facility];  Service: Endoscopy;  Laterality: N/A;    Family  Psychiatric History: See below  Family History:  Family History  Problem Relation Age of Onset  . Diabetes Mother   . Coronary artery disease Father   . Gout Father   . Breast cancer Sister   . Pancreatic cancer Brother   . Alcohol abuse Brother   . Healthy Brother   . Lung cancer Sister   . Healthy Sister   . Depression Sister   . Hyperlipidemia Daughter   . Hyperlipidemia Son   . Bipolar disorder Son   . Healthy Sister   . Healthy Sister   . Seizures Other     Social History:  Social History   Socioeconomic History  . Marital status: Married    Spouse name: Not on file  . Number of children: Not on file  . Years of education: Not on file  .  Highest education level: Not on file  Occupational History  . Not on file  Social Needs  . Financial resource strain: Not on file  . Food insecurity:    Worry: Not on file    Inability: Not on file  . Transportation needs:    Medical: Not on file    Non-medical: Not on file  Tobacco Use  . Smoking status: Never Smoker  . Smokeless tobacco: Never Used  Substance and Sexual Activity  . Alcohol use: No    Alcohol/week: 0.0 standard drinks  . Drug use: No  . Sexual activity: Not on file  Lifestyle  . Physical activity:    Days per week: Not on file    Minutes per session: Not on file  . Stress: Not on file  Relationships  . Social connections:    Talks on phone: Not on file    Gets together: Not on file    Attends religious service: Not on file    Active member of club or organization: Not on file    Attends meetings of clubs or organizations: Not on file    Relationship status: Not on file  Other Topics Concern  . Not on file  Social History Narrative  . Not on file    Allergies:  Allergies  Allergen Reactions  . Codeine Nausea And Vomiting  . Ciprofloxacin     Nausea and Vomiting  . Flagyl [Metronidazole]     Nausea and vomiting  . Oxycodone Nausea And Vomiting    Metabolic Disorder Labs: No results  found for: HGBA1C, MPG No results found for: PROLACTIN Lab Results  Component Value Date   TRIG 113 07/28/2013   Lab Results  Component Value Date   TSH 1.213 09/14/2014    Therapeutic Level Labs: No results found for: LITHIUM No results found for: VALPROATE No components found for:  CBMZ  Current Medications: Current Outpatient Medications  Medication Sig Dispense Refill  . acetaminophen (TYLENOL) 500 MG tablet Take 500 mg by mouth daily as needed for moderate pain or headache.    . ARIPiprazole (ABILIFY) 10 MG tablet Take 1 tablet (10 mg total) by mouth at bedtime. 100 tablet 2  . Cholecalciferol (VITAMIN D) 400 UNITS capsule Take 400 Units by mouth daily.    . Fish Oil-Cholecalciferol (FISH OIL + D3 PO) Take 1 capsule by mouth 2 (two) times daily.     . furosemide (LASIX) 40 MG tablet Take 40 mg by mouth daily as needed.    Marland Kitchen HYDROcodone-acetaminophen (NORCO/VICODIN) 5-325 MG tablet Take 1 tablet by mouth every 6 (six) hours as needed for moderate pain. 28 tablet 0  . lamoTRIgine (LAMICTAL) 200 MG tablet Take 1 tablet (200 mg total) by mouth daily. 90 tablet 2  . meloxicam (MOBIC) 15 MG tablet Take 1 tablet (15 mg total) by mouth daily as needed for pain.    . metoprolol tartrate (LOPRESSOR) 25 MG tablet Take 12.5 mg by mouth 2 (two) times daily.     Marland Kitchen omeprazole (PRILOSEC) 20 MG capsule TAKE 1 CAPSULE BY MOUTH TWICE A DAY BEFORE A MEAL 180 capsule 3  . ondansetron (ZOFRAN) 4 MG tablet TAKE 1 TABLET EVERY 8 HOURS AS NEEDED NAUSEA AND VOMITING 30 tablet 2  . ondansetron (ZOFRAN) 8 MG tablet Take 1 tablet (8 mg total) by mouth 2 (two) times daily. as needed for nausea and vomiting. 30 tablet 2  . OVER THE COUNTER MEDICATION Fiber well - 5 grams 1 chewable tablet twice  daily.    . polyethylene glycol powder (GLYCOLAX/MIRALAX) powder TAKE 8.5 GRAMS(1/2 CAPFUL) MIXED IN FLUID AND DRANK EVERY OTHER DAY (Patient taking differently: TAKE 17 G MIXED IN FLUID AND DRINK AS NEEDED FOR  CONSTIPATION) 1581 g 5  . potassium chloride (K-DUR) 10 MEQ tablet Take 2 tablets (20 mEq total) by mouth as needed. Patient is to take this on the days she takes the Demadex. 30 tablet 3  . simvastatin (ZOCOR) 20 MG tablet Take 20 mg by mouth every evening.     . torsemide (DEMADEX) 20 MG tablet TAKE 1 TABLET BY MOUTH DAILY AS NEEDED FOR SWELLING 30 tablet 3  . vitamin B-12 (CYANOCOBALAMIN) 1000 MCG tablet Take 1,000 mcg by mouth daily.    Marland Kitchen zolpidem (AMBIEN) 10 MG tablet Take 1 tablet (10 mg total) by mouth at bedtime. 90 tablet 2  . carvedilol (COREG) 6.25 MG tablet Take 6.25 mg by mouth 2 (two) times daily.  0   No current facility-administered medications for this visit.      Musculoskeletal: Strength & Muscle Tone: within normal limits Gait & Station: normal Patient leans: N/A  Psychiatric Specialty Exam: Review of Systems  Constitutional: Positive for malaise/fatigue.  Gastrointestinal: Positive for nausea.  All other systems reviewed and are negative.   Blood pressure (!) 160/66, pulse 60, height 5\' 4"  (1.626 m), weight 161 lb (73 kg), SpO2 97 %.Body mass index is 27.64 kg/m.  General Appearance: Casual, Neat and Well Groomed  Eye Contact:  Good  Speech:  Clear and Coherent  Volume:  Normal  Mood:  Euthymic  Affect:  Flat  Thought Process:  Goal Directed  Orientation:  Full (Time, Place, and Person)  Thought Content: WDL   Suicidal Thoughts:  No  Homicidal Thoughts:  No  Memory:  Immediate;   Good Recent;   Good Remote;   Fair  Judgement:  Good  Insight:  Fair  Psychomotor Activity:  Decreased  Concentration:  Concentration: Good and Attention Span: Good  Recall:  Good  Fund of Knowledge: Good  Language: Good  Akathisia:  No  Handed:  Right  AIMS (if indicated): not done  Assets:  Communication Skills Desire for Improvement Resilience Social Support Talents/Skills  ADL's:  Intact  Cognition: WNL  Sleep:  Good   Screenings:   Assessment and Plan:  77 year old female with a history of bipolar disorder.  She is had a lot of physical problems recently but her mood has been holding steady.  She will continue Ambien 10 mg at bedtime for sleep, Lamictal 200 mg daily for mood stabilization and Abilify 10 mg at bedtime also for mood stabilization.  She will return to see me in 6 months   Diannia Ruder, MD 11/05/2017, 2:52 PM

## 2017-11-08 ENCOUNTER — Encounter (INDEPENDENT_AMBULATORY_CARE_PROVIDER_SITE_OTHER): Payer: Self-pay | Admitting: *Deleted

## 2017-11-08 ENCOUNTER — Encounter (INDEPENDENT_AMBULATORY_CARE_PROVIDER_SITE_OTHER): Payer: Self-pay | Admitting: Internal Medicine

## 2017-11-08 ENCOUNTER — Ambulatory Visit (INDEPENDENT_AMBULATORY_CARE_PROVIDER_SITE_OTHER): Payer: Medicare Other | Admitting: Internal Medicine

## 2017-11-08 VITALS — BP 160/82 | HR 60 | Temp 98.0°F | Ht 64.0 in | Wt 160.7 lb

## 2017-11-08 DIAGNOSIS — K5732 Diverticulitis of large intestine without perforation or abscess without bleeding: Secondary | ICD-10-CM

## 2017-11-08 DIAGNOSIS — R1030 Lower abdominal pain, unspecified: Secondary | ICD-10-CM

## 2017-11-08 NOTE — Patient Instructions (Signed)
CT abdomen/pelvis with CM. CMET.

## 2017-11-08 NOTE — Progress Notes (Signed)
Subjective:    Patient ID: Lindsay Palmer, female    DOB: Oct 11, 1940, 77 y.o.   MRN: [id number]  HPI Presents today with c/o abdominal pain. She was last seen in January of this year by Dr. Karilyn Cota. Hx of sigmoid diverticulitis.  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 Egbert Garibaldi in [facility] 04/20/2017. Marland Kitchen She had an anastomotic leak approximately 5 weeks post surgery.Colostomy reversed in August  . Her husband also stated she had 2 units of PRBCs after her surgery. She is on oxygen now. It sounds she has had some major complications since her surgery.  She says her BMs are normal.  No melena or BRRB. No recent fever.  Appetite is okay.  She has some nausea when eating.     Review of Systems Past Medical History:  Diagnosis Date  . Bipolar 2 disorder (HCC)   . Chronic insomnia   . Diverticulosis of colon   . Elevated LFTs   . Fatty liver disease, nonalcoholic   . GERD (gastroesophageal reflux disease)   . H/O first degree atrioventricular block   . Hypercholesteremia   . Hypertension   . Major depressive disorder   . Migraine   . Osteopenia   . Pancreatitis   . Sleep apnea    Stop Bang score of 4. Pt said she had sleep apnea at one time, but she has had another sleep study since then and they said she does not have it anymore.  . Vitamin D deficiency     Past Surgical History:  Procedure Laterality Date  . BILIARY STENT PLACEMENT N/A 07/18/2013   Procedure: BILIARY STENT PLACEMENT;  Surgeon: Malissa Hippo, MD;  Location: [facility];  Service: Endoscopy;  Laterality: N/A;  . Cataract Surgery Bilateral 08-2012  . CHOLECYSTECTOMY    . COLONOSCOPY  2003   Diverticulitis h/o polyps -due next 2015 -gastroscopy 2003  . ERCP    . ERCP N/A 07/18/2013   Procedure: ENDOSCOPIC RETROGRADE CHOLANGIOPANCREATOGRAPHY (ERCP) COMMON BILE DUCT BRUSHING;  Surgeon: Malissa Hippo, MD;  Location: [facility];  Service: Endoscopy;  Laterality: N/A;  .  ERCP N/A 11/07/2013   Procedure: ENDOSCOPIC RETROGRADE CHOLANGIOPANCREATOGRAPHY (ERCP);  Surgeon: Malissa Hippo, MD;  [facility];  Service: Endoscopy;  Laterality: N/A;  . ESOPHAGEAL DILATION N/A 08/23/2016   Procedure: ESOPHAGEAL DILATION;  Surgeon: Malissa Hippo, MD;  [facility];  Service: Endoscopy;  Laterality: N/A;  . ESOPHAGOGASTRODUODENOSCOPY N/A 08/23/2016   Procedure: ESOPHAGOGASTRODUODENOSCOPY (EGD);  Surgeon: Malissa Hippo, MD;  [facility];  Service: Endoscopy;  Laterality: N/A;  245-moved up to 100 per [name]  . EYE SURGERY Bilateral    cataract removal  . NECK SURGERY     Stenosis C6-C7  . SIGMOIDOSCOPY  2007   no polyps  . SPHINCTEROTOMY N/A 07/18/2013   Procedure: SPHINCTEROTOMY;  Surgeon: Malissa Hippo, MD;  [facility];  Service: Endoscopy;  Laterality: N/A;  . SPHINCTEROTOMY  11/07/2013   Procedure: EXTENSION OF SPHINCTEROTOMY;  Surgeon: Malissa Hippo, MD;  [facility];  Service: Endoscopy;;  . STENT REMOVAL N/A 11/07/2013   Procedure: STENT REMOVAL;  Surgeon: Malissa Hippo, MD;  [facility]: [facility];  Service: Endoscopy;  Laterality: N/A;    Allergies  Allergen Reactions  . Codeine Nausea And Vomiting  . Ciprofloxacin     Nausea and Vomiting  . Flagyl [Metronidazole]     Nausea and vomiting  . Oxycodone Nausea  And Vomiting    Current Outpatient Medications on File Prior to Visit  Medication Sig Dispense Refill  . ARIPiprazole (ABILIFY) 10 MG tablet Take 1 tablet (10 mg total) by mouth at bedtime. 100 tablet 2  . aspirin EC 81 MG tablet Take 81 mg by mouth daily.    . Cholecalciferol (VITAMIN D) 400 UNITS capsule Take 400 Units by mouth daily.    . Fish Oil-Cholecalciferol (FISH OIL + D3 PO) Take 1 capsule by mouth 2 (two) times daily.     . furosemide (LASIX) 40 MG tablet Take 40 mg by mouth daily as needed.    . lamoTRIgine (LAMICTAL) 200 MG tablet Take 1 tablet (200 mg total) by mouth daily. 90 tablet 2  . Omega-3  Fatty Acids (FISH OIL) 1000 MG CAPS Take by mouth.    Marland Kitchen omeprazole (PRILOSEC) 20 MG capsule TAKE 1 CAPSULE BY MOUTH TWICE A DAY BEFORE A MEAL 180 capsule 3  . ondansetron (ZOFRAN) 4 MG tablet TAKE 1 TABLET EVERY 8 HOURS AS NEEDED NAUSEA AND VOMITING 30 tablet 2  . simvastatin (ZOCOR) 20 MG tablet Take 20 mg by mouth every evening.     . traMADol (ULTRAM) 50 MG tablet Take by mouth every 6 (six) hours as needed.    . vitamin B-12 (CYANOCOBALAMIN) 1000 MCG tablet Take 1,000 mcg by mouth daily.    Marland Kitchen zolpidem (AMBIEN) 10 MG tablet Take 1 tablet (10 mg total) by mouth at bedtime. 90 tablet 2   No current facility-administered medications on file prior to visit.         Objective:   Physical Exam Blood pressure (!) 160/82, pulse 60, temperature 98 F (36.7 C), height 5\' 4"  (1.626 m), weight 160 lb 11.2 oz (72.9 kg).  Alert and oriented. Skin warm and dry. Oral mucosa is moist.   . Sclera anicteric, conjunctivae is pink. Thyroid not enlarged. No cervical lymphadenopathy. Lungs clear. Heart regular rate and rhythm.  Abdomen is soft. Bowel sounds are positive. No hepatomegaly. No abdominal masses felt. No tenderness.  No guarding. No edema to lower extremities.  Pinkish scars noted to abdomen. No drainage to abdomen.         Assessment & Plan:  Abdominal pain. Will get a CT abdomen/pelvis with CM.  CMET. Further recommendations to follow.

## 2017-11-09 ENCOUNTER — Other Ambulatory Visit (INDEPENDENT_AMBULATORY_CARE_PROVIDER_SITE_OTHER): Payer: Self-pay | Admitting: Internal Medicine

## 2017-11-09 DIAGNOSIS — R110 Nausea: Secondary | ICD-10-CM

## 2017-11-09 LAB — COMPREHENSIVE METABOLIC PANEL
AG Ratio: 1.4 (calc) (ref 1.0–2.5)
ALT: 7 U/L (ref 6–29)
AST: 12 U/L (ref 10–35)
Albumin: 3.7 g/dL (ref 3.6–5.1)
Alkaline phosphatase (APISO): 89 U/L (ref 33–130)
BILIRUBIN TOTAL: 0.5 mg/dL (ref 0.2–1.2)
BUN / CREAT RATIO: 15 (calc) (ref 6–22)
BUN: 14 mg/dL (ref 7–25)
CALCIUM: 8.9 mg/dL (ref 8.6–10.4)
CO2: 31 mmol/L (ref 20–32)
CREATININE: 0.95 mg/dL — AB (ref 0.60–0.93)
Chloride: 102 mmol/L (ref 98–110)
GLUCOSE: 90 mg/dL (ref 65–139)
Globulin: 2.7 g/dL (calc) (ref 1.9–3.7)
Potassium: 3.8 mmol/L (ref 3.5–5.3)
SODIUM: 143 mmol/L (ref 135–146)
TOTAL PROTEIN: 6.4 g/dL (ref 6.1–8.1)

## 2017-11-16 ENCOUNTER — Telehealth: Payer: Self-pay | Admitting: Gastroenterology

## 2017-11-16 ENCOUNTER — Encounter (HOSPITAL_COMMUNITY): Payer: Self-pay

## 2017-11-16 ENCOUNTER — Ambulatory Visit (HOSPITAL_COMMUNITY)
Admission: RE | Admit: 2017-11-16 | Discharge: 2017-11-16 | Disposition: A | Discharge status: Home / Self Care | Payer: Medicare Other | Source: Ambulatory Visit | Attending: Internal Medicine | Admitting: Internal Medicine

## 2017-11-16 DIAGNOSIS — I700 Atherosclerosis of aorta: Secondary | ICD-10-CM | POA: Insufficient documentation

## 2017-11-16 DIAGNOSIS — R1030 Lower abdominal pain, unspecified: Secondary | ICD-10-CM | POA: Diagnosis present

## 2017-11-16 DIAGNOSIS — K5732 Diverticulitis of large intestine without perforation or abscess without bleeding: Secondary | ICD-10-CM | POA: Diagnosis not present

## 2017-11-16 DIAGNOSIS — J90 Pleural effusion, not elsewhere classified: Secondary | ICD-10-CM | POA: Diagnosis not present

## 2017-11-16 MED ORDER — IOPAMIDOL (ISOVUE-300) INJECTION 61%
100.0000 mL | Freq: Once | INTRAVENOUS | Status: AC | PRN
  Administered 2017-11-16: 100 mL via INTRAVENOUS

## 2017-11-16 MED ORDER — AMOXICILLIN-POT CLAVULANATE 875-125 MG PO TABS: 1.0000 | ORAL_TABLET | Freq: Two times a day (BID) | ORAL | 0 refills | Status: DC

## 2017-11-16 NOTE — Telephone Encounter (Signed)
CALLED BY [facility]. PT HAS ABSCESS. SPOKE WITH IR: DR Ardelle Anton. REVIEWED FILMS. TREAT CONSERVATIVELY NOT A TRUE ABSCESS. ABX AND THEN CT ABD/PELVIS WITH IV CONTRAST ONLY.  RX SENT FOR AUGMENTIN. WILL CALL AND DISCUSS WITH PT.

## 2017-11-18 NOTE — Telephone Encounter (Addendum)
I PERSONALLY REVIEWED THE CT AUG 2019 WITH DR. [facility]. FLUID COLLECTION AT SB ANATOMOSIS WAS 4.6x3.8x2.6 CM). UNABLE TO DISTINGUISH COLLECTION FROM BOWEL [place] ND PT AT RISK FOR BOWEL PERFORATION WITH IR DRAINAGE. CALLED DR. HAMDY TO DISCUSS BUT HE COVERING FOR DR. BIRD. RECOMMENDED PT CONTACT DR. BIRD FOR RECOMMENDATIONS.  Called patient TO DISCUSS PLAN. SHE IS TAKING AUGMENTIN. FEELING NAUSEATED. DR. Patty Sermons APP WILL CONTACT DR. MARK BIRD TO DISCUSS AND THEY SHOULD CALL DR. MARK BIRD([phone]) OFC AS WELL ON MON OCT 21. PT INSTRUCTED TO GO TO ED IF SHE CANNOT EAT OR DRINK ANYTHING AND HER URINE TURNS ORANGE INSTEAD OF LIGHT YELLOW.

## 2017-11-18 NOTE — Telephone Encounter (Addendum)
HUSBAND CALLED. SINCE ILEOSTOMY HAS BEEN REVERSED PT HAS BEEN SICK EVER SINCE. WILL CALL DR. BIRD TO DISCUSS AND REACH OUT TO Lindsay Palmer IN THE AFTERNOON OCT 21.

## 2017-11-19 NOTE — Telephone Encounter (Signed)
REVIEWED-NO ADDITIONAL RECOMMENDATIONS. 

## 2017-11-19 NOTE — Telephone Encounter (Signed)
SPOKE WITH DR. BIRD. AWARE OF FINDINGS ON CT. WILL SEE PT @330P  TODAY. REQUESTING FILMS FROM [facility].

## 2017-11-19 NOTE — Telephone Encounter (Signed)
Patient has picked up CD.

## 2017-11-26 ENCOUNTER — Other Ambulatory Visit (INDEPENDENT_AMBULATORY_CARE_PROVIDER_SITE_OTHER): Payer: Self-pay | Admitting: Internal Medicine

## 2017-11-27 NOTE — Telephone Encounter (Signed)
Patient's progress noted along with review of CT. Dr. Egbert Garibaldi presently following patient. Will arrange office visit when necessary.

## 2017-12-19 ENCOUNTER — Telehealth (INDEPENDENT_AMBULATORY_CARE_PROVIDER_SITE_OTHER): Payer: Self-pay | Admitting: *Deleted

## 2017-12-19 NOTE — Telephone Encounter (Signed)
Notes being faxed.

## 2017-12-19 NOTE — Telephone Encounter (Signed)
Mr. Carleene Mainsurpin called and states that his wife is having bad abdominal pain , nausea. This has been this way since she had the reverse surgery for the ostomy. She saw Dr.Byrd in [place] , he did tests ,which showed that she had a slow stomach. He prescribed generic Reglan 5 mg and she may take 1 by mouth three times daily. Patient read the side effects and is very concerned about taking them. They are wondering if she may have still have infection where the ostomy was reversed. Patient is a febrile , and her bowel movements have been normal.  Per Dr.Rehman - bring the patient in on 12/20/2017 , second patient , for assessment. He would like the results from Dr.Byrd in [place] ,[place].

## 2017-12-20 ENCOUNTER — Encounter (INDEPENDENT_AMBULATORY_CARE_PROVIDER_SITE_OTHER): Payer: Self-pay | Admitting: *Deleted

## 2017-12-20 ENCOUNTER — Ambulatory Visit (INDEPENDENT_AMBULATORY_CARE_PROVIDER_SITE_OTHER): Payer: Medicare Other | Admitting: Internal Medicine

## 2017-12-20 ENCOUNTER — Encounter (INDEPENDENT_AMBULATORY_CARE_PROVIDER_SITE_OTHER): Payer: Self-pay | Admitting: Internal Medicine

## 2017-12-20 VITALS — BP 138/64 | HR 60 | Temp 98.6°F | Resp 18 | Ht 64.0 in | Wt 154.6 lb

## 2017-12-20 DIAGNOSIS — R935 Abnormal findings on diagnostic imaging of other abdominal regions, including retroperitoneum: Secondary | ICD-10-CM

## 2017-12-20 DIAGNOSIS — R1033 Periumbilical pain: Secondary | ICD-10-CM

## 2017-12-20 DIAGNOSIS — R634 Abnormal weight loss: Secondary | ICD-10-CM

## 2017-12-20 NOTE — Patient Instructions (Signed)
CT enterography to be scheduled.

## 2017-12-20 NOTE — Progress Notes (Signed)
Presenting complaint;  Mid abdominal pain nausea and weight loss.  Database and subjective:  Patient is 77 year old Caucasian female who was [facility] with sigmoid diverticulitis and responded to IV antibiotic.  She was discharged on p.o. Antibiotics.  She had follow-up CT which revealed persistent changes of diverticulitis and she was treated with Augmentin and referred to Dr. Natale LayMark Palmer for surgery. 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  Patient was hospitalized within 2 weeks of initial surgery and documented to have anastomotic leak.  She was treated with IV antibiotics for 14 days.  She fully recovered and was doing well until reversal of her ileostomy which was on 09/24/2017.  Her husband tells me that she had Gastrografin enema prior to surgery and was normal. He became febrile.  She was hospitalized and give her IV antibiotics.  She felt better and was discharged and readmitted this time with pneumonia. She was seen in our office 5 minutes Statzer NP on 11/08/2017 for persistent mid abdominal pain.  Abdominopelvic CT was obtained with contrast and revealed soft tissue density in the vicinity of anastomosis and unusual collection of contrast concerning for contained perforation.  This area measured 3.3 x 2.5 cm.  [name] NP asked Dr. feels for opinion during my absence.  Patient was begun on Augmentin. Patient finished course of Augmentin but does not feel any better.  Although she says she had better last week.  This week she has been having daily pain.  Pain is primarily in mid abdomen.  It can be mild to intense.  Her husband states that she sits and suffers in afraid to take pain medication.  She has had nausea and poor appetite.  She has not had any vomiting in the last few days.  She believes she has not had any fever in 4 weeks..  She is  having normal bowel movements.  She had 2 yesterday but none today.  She denies melena or rectal bleeding.  She has lost 6 pounds since her visit to our office on 11/08/2017.  In the last 11 months she has lost 28 pounds. Patient states Lindsay Palmer referred her to Lindsay Palmer for second opinion and was decided to manage conservatively.    Current Medications: Outpatient Encounter Medications as of 12/20/2017  Medication Sig  . ARIPiprazole (ABILIFY) 10 MG tablet Take 1 tablet (10 mg total) by mouth at bedtime.  . Ascorbic Acid (VITAMIN C PO) Take by mouth daily.  Marland Kitchen. aspirin EC 81 MG tablet Take 81 mg by mouth daily.  . carvedilol (COREG) 12.5 MG tablet Take 12.5 mg by mouth 2 (two) times daily with a meal.   . Cholecalciferol (VITAMIN D) 400 UNITS capsule Take 400 Units by mouth daily.  . Fish Oil-Cholecalciferol (FISH OIL + D3 PO) Take 1 capsule by mouth 2 (two) times daily.   . furosemide (LASIX) 40 MG tablet Take 40 mg by mouth daily as needed.  Marland Kitchen. HYDROcodone-acetaminophen (NORCO/VICODIN) 5-325 MG tablet Take 1 tablet by mouth every 6 (six) hours as needed for moderate pain.  Marland Kitchen. lamoTRIgine (LAMICTAL) 200 MG tablet Take 1 tablet (200 mg total) by mouth daily.  Marland Kitchen. omeprazole (PRILOSEC) 20 MG capsule TAKE 1 CAPSULE BY MOUTH TWICE A DAY BEFORE A MEAL  . ondansetron (ZOFRAN) 4 MG tablet TAKE 1 TABLET EVERY 8 HOURS AS NEEDED NAUSEA AND VOMITING  . ondansetron (  ZOFRAN) 8 MG tablet TAKE 1 TABLET BY MOUTH 2 TIMES DAILY. AS NEEDED FOR NAUSEA AND VOMITING.  . potassium chloride (MICRO-K) 10 MEQ CR capsule TAKE 2 CAPSULE BY MOUTH AS NEEDED ON DAYS MS Brogan TAKES THE DEMADEX  . simvastatin (ZOCOR) 20 MG tablet Take 20 mg by mouth every evening.   . torsemide (DEMADEX) 20 MG tablet TAKE 1 TABLET BY MOUTH DAILY AS NEEDED FOR SWELLING  . vitamin B-12 (CYANOCOBALAMIN) 1000 MCG tablet Take 1,000 mcg by mouth daily.  Marland Kitchen zolpidem (AMBIEN) 10 MG tablet Take 1 tablet (10 mg total) by mouth at bedtime.  .  [DISCONTINUED] amoxicillin-clavulanate (AUGMENTIN) 875-125 MG tablet Take 1 tablet by mouth 2 (two) times daily. (Patient not taking: Reported on 12/20/2017)  . [DISCONTINUED] Omega-3 Fatty Acids (FISH OIL) 1000 MG CAPS Take by mouth.  . [DISCONTINUED] traMADol (ULTRAM) 50 MG tablet Take by mouth every 6 (six) hours as needed.   No facility-administered encounter medications on file as of 12/20/2017.      Objective: Blood pressure 138/64, pulse 60, temperature 98.6 F (37 C), temperature source Oral, resp. rate 18, height 5\' 4"  (1.626 m), weight 154 lb 9.6 oz (70.1 kg). Patient is alert and in no acute distress. Conjunctiva is pink. Sclera is nonicteric Oropharyngeal mucosa is normal. No neck masses or thyromegaly noted. Cardiac exam with regular rhythm normal S1 and S2. No murmur or gallop noted. Lungs are clear to auscultation. Abdomen is full.  She has well-healed midline scar along with horizontal scar in right mid abdomen site where she had ileostomy.  Bowel sounds are normal.  Abdomen is soft.  She has mild tender mass in periumbilical region.  It is somewhat more pronounced right over horizontal scar.  There is fullness in this area but no definite mass. No LE edema or clubbing noted.  Labs/studies Results:  Chemistry panel from 11/08/2017. Electrolytes normal BUN 14 and creatinine 0.95 Bilirubin 0.5, AP 89, AST 12, ALT 7, total protein 6.4 and albumin 3.7. Serum calcium 8.9.  Abdominopelvic CT from 18 2019 was reviewed with Dr. Tyron Russell.  Assessment:  #1.  Mid abdominal pain.  Most of her pain is centered around scar as a result of prior ileostomy.  CT from 11/16/2017 revealed soft tissue density surrounding small bowel anastomotic site with question of phlegmon.  There was also question of contained perforation.  She has not responded to Augmentin.  Small bowel series on 12/12/2017 however was unremarkable.  While patient is not acutely ill she remains with significant symptoms  such as nausea abdominal pain and continued weight loss. Therefore needs to be reevaluated with another imaging study and I believe CT enterography may provide more accurate information. At this point I do not feel there is any indication to reintroduce antibiotic regiment.  #2.  Nausea.  Nausea is multifactorial including gastroparesis.  I wonder if gastroparesis is secondary to ongoing abdominal process which is not been clearly delineated.  She is using ondansetron on as-needed basis.  #3.  Weight loss.  Patient has lost 6 pounds since her last visit 5 weeks ago and she has lost 28 pounds since present illness began about 11 months ago.  Weight loss appears to be due to her illness and diminished calorie intake.   Plan:  CT enterography as soon as possible. She will call office if she has temp greater than 101 or pain gets worse. Patient encouraged to use pain medication when pain is moderate. Further recommendations will be made after CT  enterography completed and reviewed. Office visit in 3 months for

## 2017-12-21 ENCOUNTER — Other Ambulatory Visit (INDEPENDENT_AMBULATORY_CARE_PROVIDER_SITE_OTHER): Payer: Self-pay | Admitting: Internal Medicine

## 2017-12-24 ENCOUNTER — Other Ambulatory Visit: Payer: Self-pay

## 2017-12-24 ENCOUNTER — Emergency Department (HOSPITAL_COMMUNITY)
Admission: EM | Admit: 2017-12-24 | Discharge: 2017-12-24 | Disposition: A | Discharge status: Home / Self Care | Payer: Medicare Other | Attending: Emergency Medicine | Admitting: Emergency Medicine

## 2017-12-24 ENCOUNTER — Encounter (HOSPITAL_COMMUNITY): Payer: Self-pay | Admitting: Emergency Medicine

## 2017-12-24 ENCOUNTER — Ambulatory Visit (HOSPITAL_COMMUNITY)
Admission: RE | Admit: 2017-12-24 | Discharge: 2017-12-24 | Disposition: A | Discharge status: Home / Self Care | Payer: Medicare Other | Source: Ambulatory Visit | Attending: Internal Medicine | Admitting: Internal Medicine

## 2017-12-24 DIAGNOSIS — R935 Abnormal findings on diagnostic imaging of other abdominal regions, including retroperitoneum: Secondary | ICD-10-CM | POA: Insufficient documentation

## 2017-12-24 DIAGNOSIS — Y9389 Activity, other specified: Secondary | ICD-10-CM | POA: Insufficient documentation

## 2017-12-24 DIAGNOSIS — I10 Essential (primary) hypertension: Secondary | ICD-10-CM | POA: Insufficient documentation

## 2017-12-24 DIAGNOSIS — Y999 Unspecified external cause status: Secondary | ICD-10-CM | POA: Insufficient documentation

## 2017-12-24 DIAGNOSIS — R1033 Periumbilical pain: Secondary | ICD-10-CM

## 2017-12-24 DIAGNOSIS — Z79899 Other long term (current) drug therapy: Secondary | ICD-10-CM | POA: Insufficient documentation

## 2017-12-24 DIAGNOSIS — Y92239 Unspecified place in hospital as the place of occurrence of the external cause: Secondary | ICD-10-CM | POA: Insufficient documentation

## 2017-12-24 DIAGNOSIS — T82898A Other specified complication of vascular prosthetic devices, implants and grafts, initial encounter: Secondary | ICD-10-CM | POA: Diagnosis not present

## 2017-12-24 DIAGNOSIS — Y658 Other specified misadventures during surgical and medical care: Secondary | ICD-10-CM | POA: Diagnosis not present

## 2017-12-24 DIAGNOSIS — S59911A Unspecified injury of right forearm, initial encounter: Secondary | ICD-10-CM | POA: Diagnosis present

## 2017-12-24 DIAGNOSIS — Z7982 Long term (current) use of aspirin: Secondary | ICD-10-CM | POA: Diagnosis not present

## 2017-12-24 LAB — POCT I-STAT CREATININE: Creatinine, Ser: 1 mg/dL (ref 0.44–1.00)

## 2017-12-24 MED ORDER — IOPAMIDOL (ISOVUE-300) INJECTION 61%
150.0000 mL | Freq: Once | INTRAVENOUS | Status: AC | PRN
  Administered 2017-12-24: 125 mL via INTRAVENOUS

## 2017-12-24 NOTE — ED Notes (Signed)
Arm elevated on pillow

## 2017-12-24 NOTE — Discharge Instructions (Addendum)
Keep your arm elevated as much as possible this evening.  You may try warm compresses.  Return here tomorrow for recheck if not improving.  Return sooner for any worsening symptoms such as numbness to your fingers, or increasing pain.

## 2017-12-24 NOTE — ED Notes (Signed)
Right elevated more at this time. Pt given crackers and water

## 2017-12-24 NOTE — ED Provider Notes (Signed)
Medical screening examination/treatment/procedure(s) were conducted as a shared visit with non-physician practitioner(s) and myself.  I personally evaluated the patient during the encounter.  None  Patient seen by me along with physician assistant.  Patient was having a CT scan done with contrast.  It infiltrated into her right arm.  It was under a pump and pressure.  Large amount of fluid went into the forearm.  Patient with good cap refill to her fingertips no pain in her hand or fingers.  No real swelling to the hand itself.  Good radial pulse.  Sensation intact.  No concerns for compartment syndrome at this time.  Conversed with radiology the actual contrast itself is not harmful to the soft tissues.  This was an IV site that infiltrated.  Patient is having arm elevated.  Will observe for a couple hours.  Just to see if the fluid moves up into the forearm area.  And if patient develops no symptoms.  Patient should be stable for discharge home.  Would expect the arm to be swollen a fair amount for the next 24 hours and even for a few days after that.  Patient is not traveling any great distance for the holidays.  And is not flying.   Vanetta MuldersZackowski, Kunal Levario, MD 12/24/17 [phone]

## 2017-12-24 NOTE — ED Triage Notes (Signed)
Pt sent over by CT. States she was there for an abdominal CT, had IV placed, but when contrast was injected, it infiltrated. Pt has bruising and swelling to RUE.

## 2017-12-24 NOTE — ED Provider Notes (Signed)
Medical screening examination/treatment/procedure(s) were conducted as a shared visit with non-physician practitioner(s) and myself.  I personally evaluated the patient during the encounter.  None  Patient was reassessed again by myself.  IV infiltrated during administration of contrast for CT.  She still having some persistent swelling although improved.  Forearm is softer.  She denies any pain.  No paresthesias.  Motor function in radial ulnar and median nerves and sensory function is intact distally.  Patient is comfortable going home at this time and I feel she is medically safe to do so.  Advised to keep it elevated throughout the night the best she can.  Emergent return precautions were discussed.   Raeford RazorKohut, Ashley Montminy, MD 12/24/17 [id number]

## 2017-12-25 ENCOUNTER — Emergency Department (HOSPITAL_COMMUNITY): Payer: Medicare Other

## 2017-12-28 ENCOUNTER — Ambulatory Visit (HOSPITAL_COMMUNITY)
Admission: RE | Admit: 2017-12-28 | Discharge: 2017-12-28 | Disposition: A | Discharge status: Home / Self Care | Payer: Medicare Other | Source: Ambulatory Visit | Attending: Internal Medicine | Admitting: Internal Medicine

## 2017-12-28 DIAGNOSIS — R1033 Periumbilical pain: Secondary | ICD-10-CM | POA: Insufficient documentation

## 2017-12-28 DIAGNOSIS — R935 Abnormal findings on diagnostic imaging of other abdominal regions, including retroperitoneum: Secondary | ICD-10-CM | POA: Insufficient documentation

## 2017-12-28 MED ORDER — IOPAMIDOL (ISOVUE-300) INJECTION 61%
75.0000 mL | Freq: Once | INTRAVENOUS | Status: AC | PRN
  Administered 2017-12-28: 75 mL via INTRAVENOUS

## 2017-12-29 ENCOUNTER — Other Ambulatory Visit (INDEPENDENT_AMBULATORY_CARE_PROVIDER_SITE_OTHER): Payer: Self-pay | Admitting: Internal Medicine

## 2018-01-03 ENCOUNTER — Other Ambulatory Visit (INDEPENDENT_AMBULATORY_CARE_PROVIDER_SITE_OTHER): Payer: Self-pay | Admitting: *Deleted

## 2018-01-03 DIAGNOSIS — K5900 Constipation, unspecified: Secondary | ICD-10-CM

## 2018-01-03 MED ORDER — POLYETHYLENE GLYCOL 3350 17 GM/SCOOP PO POWD: ORAL | 3 refills | Status: DC

## 2018-01-08 NOTE — ED Provider Notes (Signed)
[facility] EMERGENCY DEPARTMENT Provider Note   CSN: [id number] Arrival date & time: 12/24/17  1246     History   Chief Complaint Chief Complaint  Patient presents with  . Arm Problem    HPI Romero LinerHelen Mittal is a 77 y.o. female.  HPI   Romero LinerHelen Woodbury is a 77 y.o. female who presents to the Emergency Department complaining of sudden pain and swelling of her right arm.  She was sent to the emergency department for evaluation from the CT department.  She was here having an abdominal CT when the IV was placed and contrast was injected, it infiltrated.  She noticed increased pain and swelling of the right forearm.  I spoke with the radiologist, who confirmed that it was an extravasation of contrast.  patient denies numbness of the extremity, discoloration, or pain radiating into the upper arm or shoulder.  No pain of the hand or fingers.   Past Medical History:  Diagnosis Date  . Bipolar 2 disorder (HCC)   . Chronic insomnia   . Diverticulosis of colon   . Elevated LFTs   . Fatty liver disease, nonalcoholic   . GERD (gastroesophageal reflux disease)   . H/O first degree atrioventricular block   . Hypercholesteremia   . Hypertension   . Major depressive disorder   . Migraine   . Osteopenia   . Pancreatitis   . Sleep apnea    Stop Bang score of 4. Pt said she had sleep apnea at one time, but she has had another sleep study since then and they said she does not have it anymore.  . Vitamin D deficiency     Patient Active Problem List   Diagnosis Date Noted  . Diverticulitis 01/10/2017  . Other dysphagia 08/10/2016  . Bipolar I disorder, most recent episode depressed (HCC) 07/29/2014  . Constipation 09/05/2013  . Pancreatic pseudocyst 08/21/2013  . Anemia 07/23/2013  . Drug-induced pancreatitis DUE TO CONTRAST 07/19/2013  . Benign essential HTN 07/19/2013  . Urinary retention 07/19/2013  . Acute pancreatitis 07/19/2013  . Abdominal pain 07/10/2013    Past Surgical  History:  Procedure Laterality Date  . BILIARY STENT PLACEMENT N/A 07/18/2013   Procedure: BILIARY STENT PLACEMENT;  Surgeon: Malissa HippoNajeeb U Rehman, MD;  Location: [facility];  Service: Endoscopy;  Laterality: N/A;  . Cataract Surgery Bilateral 08-2012  . CHOLECYSTECTOMY    . COLONOSCOPY  2003   Diverticulitis h/o polyps -due next 2015 -gastroscopy 2003  . ERCP    . ERCP N/A 07/18/2013   Procedure: ENDOSCOPIC RETROGRADE CHOLANGIOPANCREATOGRAPHY (ERCP) COMMON BILE DUCT BRUSHING;  Surgeon: Malissa HippoNajeeb U Rehman, MD;  Location: [facility];  Service: Endoscopy;  Laterality: N/A;  . ERCP N/A 11/07/2013   Procedure: ENDOSCOPIC RETROGRADE CHOLANGIOPANCREATOGRAPHY (ERCP);  Surgeon: Malissa HippoNajeeb U Rehman, MD;  Location: [facility];  Service: Endoscopy;  Laterality: N/A;  . ESOPHAGEAL DILATION N/A 08/23/2016   Procedure: ESOPHAGEAL DILATION;  Surgeon: Malissa Hippoehman, Najeeb U, MD;  Location: [facility];  Service: Endoscopy;  Laterality: N/A;  . ESOPHAGOGASTRODUODENOSCOPY N/A 08/23/2016   Procedure: ESOPHAGOGASTRODUODENOSCOPY (EGD);  Surgeon: Malissa Hippoehman, Najeeb U, MD;  Location: [facility];  Service: Endoscopy;  Laterality: N/A;  245-moved up to 100 per Dewayne HatchAnn  . EYE SURGERY Bilateral    cataract removal  . NECK SURGERY     Stenosis C6-C7  . SIGMOIDOSCOPY  2007   no polyps  . SPHINCTEROTOMY N/A 07/18/2013   Procedure: SPHINCTEROTOMY;  Surgeon: Malissa HippoNajeeb U Rehman, MD;  Location: [facility];  Service: Endoscopy;  Laterality: N/A;  . SPHINCTEROTOMY  11/07/2013   Procedure: EXTENSION OF SPHINCTEROTOMY;  Surgeon: Malissa Hippo, MD;  Location: [facility];  Service: Endoscopy;;  . STENT REMOVAL N/A 11/07/2013   Procedure: STENT REMOVAL;  Surgeon: Malissa Hippo, MD;  Location: [facility];  Service: Endoscopy;  Laterality: N/A;     OB History   None      Home Medications    Prior to Admission medications   Medication Sig Start Date End Date Taking? Authorizing Provider  ARIPiprazole (ABILIFY) 10 MG tablet Take 1 tablet (10 mg total) by mouth at  bedtime. 11/05/17   Myrlene Broker, MD  Ascorbic Acid (VITAMIN C PO) Take by mouth daily.    [provider]  aspirin EC 81 MG tablet Take 81 mg by mouth daily.    [provider]  carvedilol (COREG) 12.5 MG tablet Take 12.5 mg by mouth 2 (two) times daily with a meal.  12/17/17   [provider]  Cholecalciferol (VITAMIN D) 400 UNITS capsule Take 400 Units by mouth daily.    [provider]  Fish Oil-Cholecalciferol (FISH OIL + D3 PO) Take 1 capsule by mouth 2 (two) times daily.     [provider]  furosemide (LASIX) 40 MG tablet Take 40 mg by mouth daily as needed.    [provider]  HYDROcodone-acetaminophen (NORCO/VICODIN) 5-325 MG tablet Take 1 tablet by mouth every 6 (six) hours as needed for moderate pain.    [provider]  lamoTRIgine (LAMICTAL) 200 MG tablet Take 1 tablet (200 mg total) by mouth daily. 11/05/17   Myrlene Broker, MD  omeprazole (PRILOSEC) 20 MG capsule TAKE 1 CAPSULE BY MOUTH TWICE A DAY BEFORE A MEAL 07/19/17   Rehman, Joline Maxcy, MD  ondansetron (ZOFRAN) 4 MG tablet TAKE 1 TABLET EVERY 8 HOURS AS NEEDED NAUSEA AND VOMITING 01/25/17   Setzer, Terri L, NP  ondansetron (ZOFRAN) 8 MG tablet TAKE 1 TABLET BY MOUTH 2 TIMES DAILY. AS NEEDED FOR NAUSEA AND VOMITING. 11/12/17   Setzer, Brand Males, NP  polyethylene glycol powder (GLYCOLAX/MIRALAX) powder Take 17 grams daily prn for Constipation. 01/03/18   Rehman, Joline Maxcy, MD  potassium chloride (MICRO-K) 10 MEQ CR capsule TAKE 2 CAPSULE BY MOUTH AS NEEDED ON DAYS MS Briannia TAKES THE [facility] 11/27/17   Rehman, Joline Maxcy, MD  simvastatin (ZOCOR) 20 MG tablet Take 20 mg by mouth every evening.     [provider]  torsemide (DEMADEX) 20 MG tablet TAKE 1 TABLET BY MOUTH DAILY AS NEEDED FOR SWELLING 12/24/17   Rehman, Joline Maxcy, MD  vitamin B-12 (CYANOCOBALAMIN) 1000 MCG tablet Take 1,000 mcg by mouth daily.    [provider]  zolpidem (AMBIEN) 10 MG tablet  Take 1 tablet (10 mg total) by mouth at bedtime. 11/05/17   Myrlene Broker, MD    Family History Family History  Problem Relation Age of Onset  . Diabetes Mother   . Coronary artery disease Father   . Gout Father   . Breast cancer Sister   . Pancreatic cancer Brother   . Alcohol abuse Brother   . Healthy Brother   . Lung cancer Sister   . Healthy Sister   . Depression Sister   . Hyperlipidemia Daughter   . Hyperlipidemia Son   . Bipolar disorder Son   . Healthy Sister   . Healthy Sister   . Seizures Other     Social History Social History  Tobacco Use  . Smoking status: Never Smoker  . Smokeless tobacco: Never Used  Substance Use Topics  . Alcohol use: No    Alcohol/week: 0.0 standard drinks  . Drug use: No     Allergies   Codeine; Ciprofloxacin; Flagyl [metronidazole]; and Oxycodone   Review of Systems Review of Systems  Constitutional: Negative for chills and fever.  Respiratory: Negative for chest tightness.   Cardiovascular: Negative for chest pain.  Gastrointestinal: Negative for abdominal pain, nausea and vomiting.  Musculoskeletal: Positive for myalgias (right forearm pain). Negative for joint swelling.  Skin: Negative for color change and wound.  Neurological: Negative for dizziness, weakness, numbness and headaches.     Physical Exam Updated Vital Signs BP (!) 177/48   Pulse 70   Temp 98 F (36.7 C) (Oral)   Resp 15   Ht 5\' 4"  (1.626 m)   Wt 68 kg   SpO2 98%   BMI 25.75 kg/m   Physical Exam  Constitutional: She appears well-developed. No distress.  HENT:  Head: Atraumatic.  Mouth/Throat: Oropharynx is clear and moist.  Neck: Normal range of motion. Neck supple.  Cardiovascular: Normal rate, regular rhythm and intact distal pulses.  Pulmonary/Chest: Effort normal and breath sounds normal.  Musculoskeletal: She exhibits edema and tenderness.       Right forearm: She exhibits tenderness and swelling. She exhibits no deformity.    Moderate, diffuse edema of the right forearm.  Does not appear to extend into the upper arm or hand.  Cap refill <2 seconds, she has full ROM of the fingers.    Neurological: She is alert. No sensory deficit.  Skin: Skin is warm. Capillary refill takes less than 2 seconds. No erythema.  Nursing note and vitals reviewed.    ED Treatments / Results  Labs (all labs ordered are listed, but only abnormal results are displayed) Labs Reviewed - No data to display  EKG None  Radiology No results found.  Procedures Procedures (including critical care time)  Medications Ordered in ED Medications - No data to display   Initial Impression / Assessment and Plan / ED Course  I have reviewed the triage vital signs and the nursing notes.  Pertinent labs & imaging results that were available during my care of the patient were reviewed by me and considered in my medical decision making (see chart for details).     Pt with localized edema of the right forearm after extravasation injury.  NV intact.  No other sx's to suggest allergic rxn.  Discussed care with radiologist and Dr. Deretha Emory.  Plan includes elevation and observation.    Pt has ben observed for several hours, sx's appear to be improving.  No further complications.  Remains NV intact.  Also seen by Dr. Juleen China and appears appropriate for d/c home.  Pt agrees to plan and will continue to elevate the extremity.  Strict return precautions discussed.   Final Clinical Impressions(s) / ED Diagnoses   Final diagnoses:  Extravasation injury of intravenous catheter site with other complication, initial encounter [facility])    ED Discharge Orders    None       Pauline Aus, PA-C 01/08/18 1657    Vanetta Mulders, MD 01/10/18 [phone]

## 2018-01-10 ENCOUNTER — Encounter (INDEPENDENT_AMBULATORY_CARE_PROVIDER_SITE_OTHER): Payer: Self-pay | Admitting: Internal Medicine

## 2018-01-10 ENCOUNTER — Ambulatory Visit (INDEPENDENT_AMBULATORY_CARE_PROVIDER_SITE_OTHER): Payer: Medicare Other | Admitting: Internal Medicine

## 2018-01-10 VITALS — BP 128/60 | HR 58 | Temp 97.7°F | Resp 18 | Ht 64.0 in | Wt 158.5 lb

## 2018-01-10 DIAGNOSIS — M545 Low back pain, unspecified: Secondary | ICD-10-CM

## 2018-01-10 DIAGNOSIS — K5900 Constipation, unspecified: Secondary | ICD-10-CM

## 2018-01-10 DIAGNOSIS — R600 Localized edema: Secondary | ICD-10-CM | POA: Diagnosis not present

## 2018-01-10 MED ORDER — FUROSEMIDE 40 MG PO TABS: 40.0000 mg | ORAL_TABLET | Freq: Every day | ORAL | 1 refills | Status: DC | PRN

## 2018-01-10 MED ORDER — HYDROCODONE-ACETAMINOPHEN 5-325 MG PO TABS: 1.0000 | ORAL_TABLET | Freq: Four times a day (QID) | ORAL | 0 refills | Status: DC | PRN

## 2018-01-10 MED ORDER — POLYETHYLENE GLYCOL 3350 17 GM/SCOOP PO POWD: ORAL | 3 refills | Status: DC

## 2018-01-10 NOTE — Patient Instructions (Signed)
Please call office with progress report in 1 to 2 weeks.

## 2018-01-10 NOTE — Progress Notes (Signed)
Presenting complaint;  Back pain right lumbar region.  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  She was felt to have extensive inflammation with extension to bladder wall.  She had anastomotic leak and required hospitalization and antibiotics.  Loop ileostomy was reversed in August this year and she has had abdominal pains ever since.  She was seen in October 2019 by Ms. Setzer NP and CT revealed inflammatory changes in the region of small bowel anastomosis.  She was treated with antibiotics.  I saw her on 12/20/2017 and CT was repeated and these changes appeared to have decreased somewhat since prior study but there was no fluid collection or an abscess.  She started to feel better.   She now presents with new complaint of pain in the right lumbar region was started 2 days ago.  Pain has been constant ever since.  It fluctuates.  Yesterday was a terrible day but today pain is much less.  She has more pain when she turns in certain way or when she gets in and out of car or when she stands up from supine position.  She has noted relief with pain medication and she is started to go back on meloxicam 2 weeks ago.  She is taking 7.5 mg daily.  She has not experienced any nausea vomiting or fever but she has had chills without rigors.  Her husband check temperature but 3 times and she was afebrile. Patient states her appetite is improved.  She is eating better.  She has not had much nausea since last seen.  She has gained 4 pounds.  Her bowels are moving every other day.  She is using polyethylene glycol on as-needed basis.  She denies hematuria or dysuria.  She does not have history of urolithiasis.  She has noted LE edema over the last few days and took furosemide dose yesterday.   Current Medications: Outpatient Encounter Medications as of 01/10/2018   Medication Sig  . Ascorbic Acid (VITAMIN C PO) Take by mouth daily.  Marland Kitchen. aspirin EC 81 MG tablet Take 81 mg by mouth daily.  . carvedilol (COREG) 12.5 MG tablet Take 12.5 mg by mouth 2 (two) times daily with a meal.   . Cholecalciferol (VITAMIN D) 400 UNITS capsule Take 400 Units by mouth daily.  . Fish Oil-Cholecalciferol (FISH OIL + D3 PO) Take 1 capsule by mouth 2 (two) times daily.   . furosemide (LASIX) 40 MG tablet Take 40 mg by mouth daily as needed.  Marland Kitchen. HYDROcodone-acetaminophen (NORCO/VICODIN) 5-325 MG tablet Take 1 tablet by mouth every 6 (six) hours as needed for moderate pain.  Marland Kitchen. lamoTRIgine (LAMICTAL) 200 MG tablet Take 1 tablet (200 mg total) by mouth daily.  Marland Kitchen. omeprazole (PRILOSEC) 20 MG capsule TAKE 1 CAPSULE BY MOUTH TWICE A DAY BEFORE A MEAL  . ondansetron (ZOFRAN) 4 MG tablet TAKE 1 TABLET EVERY 8 HOURS AS NEEDED NAUSEA AND VOMITING  . ondansetron (ZOFRAN) 8 MG tablet TAKE 1 TABLET BY MOUTH 2 TIMES DAILY. AS NEEDED FOR NAUSEA AND VOMITING.  . polyethylene glycol powder (GLYCOLAX/MIRALAX) powder Take 17 grams daily prn for Constipation.  . potassium chloride (MICRO-K) 10 MEQ CR capsule TAKE 2 CAPSULE BY MOUTH AS NEEDED ON DAYS MS Alaney TAKES THE DEMADEX  . simvastatin (ZOCOR) 20 MG tablet Take 20 mg by mouth every evening.   . vitamin B-12 (CYANOCOBALAMIN) 1000  MCG tablet Take 1,000 mcg by mouth daily.  Marland Kitchen zolpidem (AMBIEN) 10 MG tablet Take 1 tablet (10 mg total) by mouth at bedtime.  . [DISCONTINUED] torsemide (DEMADEX) 20 MG tablet TAKE 1 TABLET BY MOUTH DAILY AS NEEDED FOR SWELLING  . ARIPiprazole (ABILIFY) 10 MG tablet Take 1 tablet (10 mg total) by mouth at bedtime.   No facility-administered encounter medications on file as of 01/10/2018.      Objective: Blood pressure 128/60, pulse (!) 58, temperature 97.7 F (36.5 C), temperature source Oral, resp. rate 18, height 5\' 4"  (1.626 m), weight 158 lb 8 oz (71.9 kg). Patient is alert and in no acute distress. Conjunctiva  is pink. Sclera is nonicteric Oropharyngeal mucosa is normal. No neck masses or thyromegaly noted. Cardiac exam with regular rhythm normal S1 and S2. No murmur or gallop noted. Lungs are clear to auscultation. Abdomen is full.  She has midline scar along with horizontal scar in right mid abdomen.  Bowel sounds are normal.  On palpation abdomen is soft with mild midepigastric tenderness as well as tenderness around horizontal scar where there is some fullness which is felt to be scar.  No organomegaly or masses. No tenderness noted over renal angles. No tenderness noted on percussion of thoracic or lumbar spine. No tenderness noted on palpation over sacrospinalis muscles on each side. She has 1-2+ pitting edema involving her ankles.  Labs/studies Results:   CT films from 24-Nov-2017 and 12/28/2017 reviewed. Changes in the region of ileal anastomosis most likely are due to postsurgical change rather than inflammatory process.   Assessment:  #1.  Right lumbar pain appears to be musculoskeletal pain and not visceral or urologic.  She is starting to feel better.  She has a history of back pain.  If she does not respond to symptomatic therapy she will need further evaluation to be undertaken by patient's primary care physician.  #2.  Abdominal pain.  Pain has decreased.  Her nausea has improved a great deal and her appetite is improved and she has gained 4 pounds.  Therefore I do not believe she has inflammatory process or controlled anastomotic leak as was the concern 2 months ago.  #3.  Bilateral lower extremity edema.  She has a history of edema and is using furosemide on as-needed basis.   Plan:  Medication list updated.  Torsemide deleted. Patient will continue meloxicam at 7.5 mg daily for the next few weeks until back pain is resolved.  Thereafter she can use it on as-needed basis. New prescription for furosemide 40 mg daily sent to her pharmacy. New prescription also given for  polyethylene glycol. Hydrocodone/acetaminophen 3/325 mg p.o. every 6 as needed 30 doses given to patient that she can use for back pain. Patient encouraged to increase physical activity as tolerated and she may also consider stretching exercises. Patient will call with progress report in 2 to 3 weeks. Will reschedule her next visit for February 2020 and cancel January 2020 visit.

## 2018-01-31 ENCOUNTER — Other Ambulatory Visit (HOSPITAL_COMMUNITY): Payer: Self-pay | Admitting: Psychiatry

## 2018-01-31 ENCOUNTER — Telehealth (HOSPITAL_COMMUNITY): Payer: Self-pay | Admitting: *Deleted

## 2018-01-31 MED ORDER — ARIPIPRAZOLE 10 MG PO TABS: 10.0000 mg | ORAL_TABLET | Freq: Every day | ORAL | 2 refills | Status: DC

## 2018-01-31 NOTE — Telephone Encounter (Signed)
sent 

## 2018-01-31 NOTE — Telephone Encounter (Signed)
Dr Tenny Craw Patient's husband call requesting refill on Aripiprazole. He states his insurance will now cover Generic Abilify (Aripiprazole). Please send script to local [facility] Updated in system.

## 2018-02-03 ENCOUNTER — Other Ambulatory Visit (INDEPENDENT_AMBULATORY_CARE_PROVIDER_SITE_OTHER): Payer: Self-pay | Admitting: Internal Medicine

## 2018-02-05 ENCOUNTER — Ambulatory Visit (INDEPENDENT_AMBULATORY_CARE_PROVIDER_SITE_OTHER): Payer: Self-pay | Admitting: Internal Medicine

## 2018-02-24 ENCOUNTER — Other Ambulatory Visit (INDEPENDENT_AMBULATORY_CARE_PROVIDER_SITE_OTHER): Payer: Self-pay | Admitting: Internal Medicine

## 2018-02-24 DIAGNOSIS — R110 Nausea: Secondary | ICD-10-CM

## 2018-03-01 ENCOUNTER — Other Ambulatory Visit (INDEPENDENT_AMBULATORY_CARE_PROVIDER_SITE_OTHER): Payer: Self-pay | Admitting: Internal Medicine

## 2018-03-08 ENCOUNTER — Other Ambulatory Visit (INDEPENDENT_AMBULATORY_CARE_PROVIDER_SITE_OTHER): Payer: Self-pay | Admitting: Internal Medicine

## 2018-03-11 NOTE — Telephone Encounter (Signed)
I called this to the pharmacy.

## 2018-03-15 NOTE — Addendum Note (Signed)
Encounter addended by: Novella Olive on: 03/15/2018 4:32 PM  Actions taken: Imaging Exam ended

## 2018-03-19 ENCOUNTER — Ambulatory Visit (INDEPENDENT_AMBULATORY_CARE_PROVIDER_SITE_OTHER): Payer: Self-pay | Admitting: Internal Medicine

## 2018-03-26 ENCOUNTER — Ambulatory Visit (INDEPENDENT_AMBULATORY_CARE_PROVIDER_SITE_OTHER): Payer: Self-pay | Admitting: Internal Medicine

## 2018-04-02 ENCOUNTER — Ambulatory Visit (INDEPENDENT_AMBULATORY_CARE_PROVIDER_SITE_OTHER): Payer: Self-pay | Admitting: Internal Medicine

## 2018-05-08 ENCOUNTER — Other Ambulatory Visit: Payer: Self-pay

## 2018-05-08 ENCOUNTER — Encounter (HOSPITAL_COMMUNITY): Payer: Self-pay | Admitting: Psychiatry

## 2018-05-08 ENCOUNTER — Ambulatory Visit (INDEPENDENT_AMBULATORY_CARE_PROVIDER_SITE_OTHER): Payer: Medicare Other | Admitting: Psychiatry

## 2018-05-08 DIAGNOSIS — F3130 Bipolar disorder, current episode depressed, mild or moderate severity, unspecified: Secondary | ICD-10-CM | POA: Diagnosis not present

## 2018-05-08 MED ORDER — LAMOTRIGINE 200 MG PO TABS: 200.0000 mg | ORAL_TABLET | Freq: Every day | ORAL | 2 refills | Status: DC

## 2018-05-08 MED ORDER — ZOLPIDEM TARTRATE 10 MG PO TABS: 10.0000 mg | ORAL_TABLET | Freq: Every day | ORAL | 2 refills | Status: DC

## 2018-05-08 MED ORDER — ARIPIPRAZOLE 10 MG PO TABS: 10.0000 mg | ORAL_TABLET | Freq: Every day | ORAL | 2 refills | Status: DC

## 2018-05-08 NOTE — Progress Notes (Signed)
Virtual Visit via Telephone Note  I connected with Lindsay Palmer on 05/08/18 at  3:00 PM EDT by telephone and verified that I am speaking with the correct person using two identifiers.   I discussed the limitations, risks, security and privacy concerns of performing an evaluation and management service by telephone and the availability of in person appointments. I also discussed with the patient that there may be a patient responsible charge related to this service. The patient expressed understanding and agreed to proceed       I discussed the assessment and treatment plan with the patient. The patient was provided an opportunity to ask questions and all were answered. The patient agreed with the plan and demonstrated an understanding of the instructions.   The patient was advised to call back or seek an in-person evaluation if the symptoms worsen or if the condition fails to improve as anticipated.  I provided 15 minutes of non-face-to-face time during this encounter.   Diannia Ruder, MD  [facility] MD/PA/NP OP Progress Note  05/08/2018 2:58 PM Lindsay Palmer  MRN:  [id number]  Chief Complaint:  Chief Complaint    Depression; Anxiety; Follow-up     HPI: This patient is a 78 year old married white female who lives with her husband in [place]. She has a son, daughter and 3 grandchildren. She was working in [employer] prior to her retirement.  The patient was referred by [facility] where she had been receiving treatment. Her physician recently left the practice and they are no longer taking her insurance. She has a long-term history of bipolar disorder.  The patient presents with her husband today. They have been married since she was 78 years old. He states that for much of their marriage she had bouts of severe mood swings. At times she was pleasant and easy to be with the other time she was angry irritable and out of control. She also went through periods of euphoria  and spending a lot of money. At one point they went to a family counselor who suspected she had bipolar disorder. She was eventually referred here to this office and saw Dr. Lolly Mustache. He started her on Abilify and it really helped in terms of decreasing the anger and irritability. Eventually however she started seeing Dr. Tomasa Rand at [facility] because she was still having difficulties with being depressed. He added Lamictal and this seemed to stabilize her mood and she was no longer depressed or manic. The patient has been on this combination of medicines for several years and she seems very stable now. Her only difficulty is that she doesn't sleep well even with the addition of Ambien 10 mg at bedtime.  At times the patient sleeps up to 5 hours at night and sometimes only 2 hours. She's tried almost every medication there is for sleep. Her mood is generally good. However she is often tired in her energy is variable. She had a history of pancreatitis last year after having ERCP and the contrast dye caused the pancreatitis. She still has bouts of nausea and weakness. She's been followed by GI in her laboratories are normal. Her husband thinks she spends too much time in bed. She spends most of her time doing housework. She is stressed because her son and her husband are not getting along . her son is on disability due to a back injury but is trying to work doing Aeronautical engineer which worries her  The patient has never had a psychiatric hospitalization. She  does not use drugs or alcohol and has never had any psychotic symptoms such as auditory or visual hallucinations or paranoia  Patient returns for follow-up after 6 months and is assessed via telephone due to the coronavirus pandemic.  She states that overall she is doing fairly well.  She is staying at home and not venturing out much because of the stay at home order to prevent exposure to coronavirus.  She states that her mood has been stable  despite some recent stressors.  Her son-in-law died in 04/12/22 of a brain tumor which was very difficult for the family.  She is not having quite as much trouble with her stomach and she is eating more normally and has regained some of her weight.  She states that her mood is good and she denies symptoms of either depression or mania.  She still has difficulty sleeping at times.  She does not want to change the Ambien however because without it she cannot sleep at all and she is unwilling to try other medications right now.  She denies psychotic symptoms or suicidal ideation Visit Diagnosis:    ICD-10-CM   1. Bipolar I disorder, most recent episode depressed (HCC) F31.30     Past Psychiatric History: Long-term outpatient treatment for bipolar disorder  Past Medical History:  Past Medical History:  Diagnosis Date  . Bipolar 2 disorder (HCC)   . Chronic insomnia   . Diverticulosis of colon   . Elevated LFTs   . Fatty liver disease, nonalcoholic   . GERD (gastroesophageal reflux disease)   . H/O first degree atrioventricular block   . Hypercholesteremia   . Hypertension   . Major depressive disorder   . Migraine   . Osteopenia   . Pancreatitis   . Sleep apnea    Stop Bang score of 4. Pt said she had sleep apnea at one time, but she has had another sleep study since then and they said she does not have it anymore.  . Vitamin D deficiency     Past Surgical History:  Procedure Laterality Date  . BILIARY STENT PLACEMENT N/A 07/18/2013   Procedure: BILIARY STENT PLACEMENT;  Surgeon: Malissa Hippo, MD;  Location: [facility];  Service: Endoscopy;  Laterality: N/A;  . Cataract Surgery Bilateral 08-2012  . CHOLECYSTECTOMY    . COLONOSCOPY  2003   Diverticulitis h/o polyps -due next 2015 -gastroscopy 2003  . ERCP    . ERCP N/A 07/18/2013   Procedure: ENDOSCOPIC RETROGRADE CHOLANGIOPANCREATOGRAPHY (ERCP) COMMON BILE DUCT BRUSHING;  Surgeon: Malissa Hippo, MD;  Location: [facility];  Service:  Endoscopy;  Laterality: N/A;  . ERCP N/A 11/07/2013   Procedure: ENDOSCOPIC RETROGRADE CHOLANGIOPANCREATOGRAPHY (ERCP);  Surgeon: Malissa Hippo, MD;  Location: [facility];  Service: Endoscopy;  Laterality: N/A;  . ESOPHAGEAL DILATION N/A 08/23/2016   Procedure: ESOPHAGEAL DILATION;  Surgeon: Malissa Hippo, MD;  Location: [facility];  Service: Endoscopy;  Laterality: N/A;  . ESOPHAGOGASTRODUODENOSCOPY N/A 08/23/2016   Procedure: ESOPHAGOGASTRODUODENOSCOPY (EGD);  Surgeon: Malissa Hippo, MD;  Location: [facility];  Service: Endoscopy;  Laterality: N/A;  245-moved up to 100 per [name]  . EYE SURGERY Bilateral    cataract removal  . NECK SURGERY     Stenosis C6-C7  . SIGMOIDOSCOPY  2007   no polyps  . SPHINCTEROTOMY N/A 07/18/2013   Procedure: SPHINCTEROTOMY;  Surgeon: Malissa Hippo, MD;  Location: [facility];  Service: Endoscopy;  Laterality: N/A;  . SPHINCTEROTOMY  11/07/2013  Procedure: EXTENSION OF SPHINCTEROTOMY;  Surgeon: Malissa HippoNajeeb U Rehman, MD;  Location: [facility];  Service: Endoscopy;;  . STENT REMOVAL N/A 11/07/2013   Procedure: STENT REMOVAL;  Surgeon: Malissa HippoNajeeb U Rehman, MD;  Location: [facility];  Service: Endoscopy;  Laterality: N/A;    Family Psychiatric History: See below  Family History:  Family History  Problem Relation Age of Onset  . Diabetes Mother   . Coronary artery disease Father   . Gout Father   . Breast cancer Sister   . Pancreatic cancer Brother   . Alcohol abuse Brother   . Healthy Brother   . Lung cancer Sister   . Healthy Sister   . Depression Sister   . Hyperlipidemia Daughter   . Hyperlipidemia Son   . Bipolar disorder Son   . Healthy Sister   . Healthy Sister   . Seizures Other     Social History:  Social History   Socioeconomic History  . Marital status: Married    Spouse name: Not on file  . Number of children: Not on file  . Years of education: Not on file  . Highest education level: Not on file  Occupational History  . Not on file  Social  Needs  . Financial resource strain: Not on file  . Food insecurity:    Worry: Not on file    Inability: Not on file  . Transportation needs:    Medical: Not on file    Non-medical: Not on file  Tobacco Use  . Smoking status: Never Smoker  . Smokeless tobacco: Never Used  Substance and Sexual Activity  . Alcohol use: No    Alcohol/week: 0.0 standard drinks  . Drug use: No  . Sexual activity: Not on file  Lifestyle  . Physical activity:    Days per week: Not on file    Minutes per session: Not on file  . Stress: Not on file  Relationships  . Social connections:    Talks on phone: Not on file    Gets together: Not on file    Attends religious service: Not on file    Active member of club or organization: Not on file    Attends meetings of clubs or organizations: Not on file    Relationship status: Not on file  Other Topics Concern  . Not on file  Social History Narrative  . Not on file    Allergies:  Allergies  Allergen Reactions  . Codeine Nausea And Vomiting  . Ciprofloxacin     Nausea and Vomiting  . Flagyl [Metronidazole]     Nausea and vomiting  . Oxycodone Nausea And Vomiting    Metabolic Disorder Labs: No results found for: HGBA1C, MPG No results found for: PROLACTIN Lab Results  Component Value Date   TRIG 113 07/28/2013   Lab Results  Component Value Date   TSH 1.213 09/14/2014    Therapeutic Level Labs: No results found for: LITHIUM No results found for: VALPROATE No components found for:  CBMZ  Current Medications: Current Outpatient Medications  Medication Sig Dispense Refill  . ARIPiprazole (ABILIFY) 10 MG tablet Take 1 tablet (10 mg total) by mouth at bedtime. 90 tablet 2  . Ascorbic Acid (VITAMIN C PO) Take by mouth daily.    Marland Kitchen. aspirin EC 81 MG tablet Take 81 mg by mouth daily.    . carvedilol (COREG) 12.5 MG tablet Take 12.5 mg by mouth 2 (two) times daily with a meal.     .  Cholecalciferol (VITAMIN D) 400 UNITS capsule Take 400 Units  by mouth daily.    . Fish Oil-Cholecalciferol (FISH OIL + D3 PO) Take 1 capsule by mouth 2 (two) times daily.     . furosemide (LASIX) 40 MG tablet TAKE 1 TABLET BY MOUTH EVERY DAY AS NEEDED 90 tablet 0  . HYDROcodone-acetaminophen (NORCO/VICODIN) 5-325 MG tablet Take 1 tablet by mouth every 6 (six) hours as needed for moderate pain. 30 tablet 0  . lamoTRIgine (LAMICTAL) 200 MG tablet Take 1 tablet (200 mg total) by mouth daily. 90 tablet 2  . meloxicam (MOBIC) 7.5 MG tablet Take 7.5 mg by mouth daily as needed for pain.    Marland Kitchen omeprazole (PRILOSEC) 20 MG capsule TAKE 1 CAPSULE BY MOUTH TWICE A DAY BEFORE A MEAL 180 capsule 3  . ondansetron (ZOFRAN) 4 MG tablet TAKE 1 TABLET EVERY 8 HOURS AS NEEDED NAUSEA AND VOMITING 30 tablet 2  . ondansetron (ZOFRAN) 8 MG tablet TAKE 1 TABLET BY MOUTH 2 TIMES DAILY. AS NEEDED FOR NAUSEA AND VOMITING. 30 tablet 2  . polyethylene glycol powder (GLYCOLAX/MIRALAX) powder Take 17 grams daily prn for Constipation. 1581 g 3  . potassium chloride (MICRO-K) 10 MEQ CR capsule TAKE 2 CAPSULE BY MOUTH AS NEEDED ON DAYS MS Rondell TAKES THE DEMADEX 30 capsule 2  . simvastatin (ZOCOR) 20 MG tablet Take 20 mg by mouth every evening.     . vitamin B-12 (CYANOCOBALAMIN) 1000 MCG tablet Take 1,000 mcg by mouth daily.    Marland Kitchen zolpidem (AMBIEN) 10 MG tablet Take 1 tablet (10 mg total) by mouth at bedtime. 90 tablet 2   No current facility-administered medications for this visit.      Musculoskeletal: Strength & Muscle Tone: Not assessed, phone visit Gait & Station:  Patient leans:   Psychiatric Specialty Exam: Review of Systems  Gastrointestinal: Positive for abdominal pain.  Psychiatric/Behavioral: The patient has insomnia.   All other systems reviewed and are negative.   There were no vitals taken for this visit.There is no height or weight on file to calculate BMI.  General Appearance: NA  Eye Contact:  NA  Speech:  Clear and Coherent  Volume:  Normal  Mood:  Euthymic   Affect:  NA  Thought Process:  Goal Directed  Orientation:  Full (Time, Place, and Person)  Thought Content: WDL   Suicidal Thoughts:  No  Homicidal Thoughts:  No  Memory:  Immediate;   Good Recent;   Good Remote;   Fair  Judgement:  Fair  Insight:  Fair  Psychomotor Activity:  Decreased  Concentration:  Concentration: Fair and Attention Span: Fair  Recall:  [employer] of Knowledge: Good  Language: Good  Akathisia:  No  Handed:  Right  AIMS (if indicated): not done  Assets:  Communication Skills Desire for Improvement Resilience Social Support Talents/Skills  ADL's:  Intact  Cognition: WNL  Sleep:  Fair   Screenings:   Assessment and Plan: This patient is a 77 year old female with a history of bipolar disorder.  She has been quite stable on her current regimen although she sometimes has trouble sleeping.  She will continue Abilify 10 mg at bedtime for mood stabilization, Lamictal 200 mg daily also for mood stabilization and Ambien 10 mg at bedtime for sleep.  She will return to see me in 6 months or call sooner if needed or if she needs to try something other than Ambien for sleep.   Diannia Ruder, MD 05/08/2018, 2:58 PM

## 2018-06-13 ENCOUNTER — Telehealth (INDEPENDENT_AMBULATORY_CARE_PROVIDER_SITE_OTHER): Payer: Self-pay | Admitting: Internal Medicine

## 2018-06-13 MED ORDER — HYDROCODONE-ACETAMINOPHEN 5-325 MG PO TABS: 1.0000 | ORAL_TABLET | Freq: Four times a day (QID) | ORAL | 0 refills | Status: DC | PRN

## 2018-06-13 NOTE — Telephone Encounter (Signed)
err

## 2018-06-13 NOTE — Telephone Encounter (Signed)
Rx for Hydrocodone sent to front desk. Mitzi will call patient and have her pick it up Monday.

## 2018-08-03 ENCOUNTER — Other Ambulatory Visit (INDEPENDENT_AMBULATORY_CARE_PROVIDER_SITE_OTHER): Payer: Self-pay | Admitting: Internal Medicine

## 2018-08-03 DIAGNOSIS — K219 Gastro-esophageal reflux disease without esophagitis: Secondary | ICD-10-CM

## 2018-10-30 ENCOUNTER — Telehealth (HOSPITAL_COMMUNITY): Payer: Self-pay | Admitting: *Deleted

## 2018-10-30 NOTE — Telephone Encounter (Signed)
PATIENT'S HUSBAND CALLED STATED THAT THE ABILIFY HAS BEEN INCREASED BY  500% ON HIS INSURANCE PLAN (CNA'T AFFORD TO HIGH) & HE HAS REQUESTED FILING FOR LOWER TIER.   HE STATED THAT HE WOULD GO BACK TO ORDERING ABILIFY FROM [place] & THAT WE MAY RECEIVE FAX AS PREVIOUS REQUESTING SCRIPT

## 2018-10-30 NOTE — Telephone Encounter (Signed)
ok 

## 2018-11-04 ENCOUNTER — Telehealth (HOSPITAL_COMMUNITY): Payer: Self-pay

## 2018-11-04 NOTE — Telephone Encounter (Signed)
[facility] PRESCRIPTION DRUG COVERAGE APPROVED FOR TIER 1  ARIPIPRAZOLE 10MG  TABLET TICKET# [id number] EFFECTIVE 10/31/2018 TO 01/30/2019

## 2018-11-07 ENCOUNTER — Other Ambulatory Visit: Payer: Self-pay

## 2018-11-07 ENCOUNTER — Encounter (HOSPITAL_COMMUNITY): Payer: Self-pay | Admitting: Psychiatry

## 2018-11-07 ENCOUNTER — Ambulatory Visit (INDEPENDENT_AMBULATORY_CARE_PROVIDER_SITE_OTHER): Payer: Medicare Other | Admitting: Psychiatry

## 2018-11-07 DIAGNOSIS — F3130 Bipolar disorder, current episode depressed, mild or moderate severity, unspecified: Secondary | ICD-10-CM

## 2018-11-07 MED ORDER — ZOLPIDEM TARTRATE 10 MG PO TABS: 10.0000 mg | ORAL_TABLET | Freq: Every day | ORAL | 5 refills | Status: DC

## 2018-11-07 MED ORDER — LAMOTRIGINE 200 MG PO TABS: 200.0000 mg | ORAL_TABLET | Freq: Every day | ORAL | 2 refills | Status: DC

## 2018-11-07 NOTE — Progress Notes (Signed)
Virtual Visit via Telephone Note  I connected with Lindsay Palmer on 11/07/18 at  2:00 PM EDT by telephone and verified that I am speaking with the correct person using two identifiers.   I discussed the limitations, risks, security and privacy concerns of performing an evaluation and management service by telephone and the availability of in person appointments. I also discussed with the patient that there may be a patient responsible charge related to this service. The patient expressed understanding and agreed to proceed.    I discussed the assessment and treatment plan with the patient. The patient was provided an opportunity to ask questions and all were answered. The patient agreed with the plan and demonstrated an understanding of the instructions.   The patient was advised to call back or seek an in-person evaluation if the symptoms worsen or if the condition fails to improve as anticipated.  I provided 15 minutes of non-face-to-face time during this encounter.   Diannia Ruder, MD  [facility] MD/PA/NP OP Progress Note  11/07/2018 2:20 PM Earline Palmer  MRN:  [id number]  Chief Complaint:  Chief Complaint    Depression; Anxiety; Follow-up     HPI: This patient is a 78 year old married white female who lives with her husband in [place]. She has a son, daughter and 3 grandchildren. She was working in [employer] prior to her retirement.  The patient was referred by [facility] where she had been receiving treatment. Her physician recently left the practice and they are no longer taking her insurance. She has a long-term history of bipolar disorder.  The patient presents with her husband today. They have been married since she was 78 years old. He states that for much of their marriage she had bouts of severe mood swings. At times she was pleasant and easy to be with the other time she was angry irritable and out of control. She also went through periods of euphoria and  spending a lot of money. At one point they went to a family counselor who suspected she had bipolar disorder. She was eventually referred here to this office and saw Dr. Lolly Mustache. He started her on Abilify and it really helped in terms of decreasing the anger and irritability. Eventually however she started seeing Dr. Tomasa Rand at [facility] because she was still having difficulties with being depressed. He added Lamictal and this seemed to stabilize her mood and she was no longer depressed or manic. The patient has been on this combination of medicines for several years and she seems very stable now. Her only difficulty is that she doesn't sleep well even with the addition of Ambien 10 mg at bedtime.  At times the patient sleeps up to 5 hours at night and sometimes only 2 hours. She's tried almost every medication there is for sleep. Her mood is generally good. However she is often tired in her energy is variable. She had a history of pancreatitis last year after having ERCP and the contrast dye caused the pancreatitis. She still has bouts of nausea and weakness. She's been followed by GI in her laboratories are normal. Her husband thinks she spends too much time in bed. She spends most of her time doing housework. She is stressed because her son and her husband are not getting along . her son is on disability due to a back injury but is trying to work doing Aeronautical engineer which worries her  The patient has never had a psychiatric hospitalization. She does not use  drugs or alcohol and has never had any psychotic symptoms such as auditory or visual hallucinations or paranoia  The patient returns for follow-up after 6 months.  She states that she is generally doing well.  She is having a lot of back pain and has had several injections in her back and they are not helping.  Physical therapy also did not help she may be having to look at back surgery.  She is spending a lot of time resting but is getting  up to walk.  She states that her mood is good and she denies symptoms of mania racing thoughts impulsive behaviors or depression sadness suicidal ideation.  She feels like she is stable.  For the most part the Ambien continues to help her sleep. Visit Diagnosis:    ICD-10-CM   1. Bipolar I disorder, most recent episode depressed (HCC)  F31.30     Past Psychiatric History: Long-term outpatient treatment for bipolar disorder  Past Medical History:  Past Medical History:  Diagnosis Date  . Bipolar 2 disorder (HCC)   . Chronic insomnia   . Diverticulosis of colon   . Elevated LFTs   . Fatty liver disease, nonalcoholic   . GERD (gastroesophageal reflux disease)   . H/O first degree atrioventricular block   . Hypercholesteremia   . Hypertension   . Major depressive disorder   . Migraine   . Osteopenia   . Pancreatitis   . Sleep apnea    Stop Bang score of 4. Pt said she had sleep apnea at one time, but she has had another sleep study since then and they said she does not have it anymore.  . Vitamin D deficiency     Past Surgical History:  Procedure Laterality Date  . BILIARY STENT PLACEMENT N/A 07/18/2013   Procedure: BILIARY STENT PLACEMENT;  Surgeon: Malissa Hippo, MD;  Location: [facility];  Service: Endoscopy;  Laterality: N/A;  . Cataract Surgery Bilateral 08-2012  . CHOLECYSTECTOMY    . COLONOSCOPY  2003   Diverticulitis h/o polyps -due next 2015 -gastroscopy 2003  . ERCP    . ERCP N/A 07/18/2013   Procedure: ENDOSCOPIC RETROGRADE CHOLANGIOPANCREATOGRAPHY (ERCP) COMMON BILE DUCT BRUSHING;  Surgeon: Malissa Hippo, MD;  Location: [facility];  Service: Endoscopy;  Laterality: N/A;  . ERCP N/A 11/07/2013   Procedure: ENDOSCOPIC RETROGRADE CHOLANGIOPANCREATOGRAPHY (ERCP);  Surgeon: Malissa Hippo, MD;  Location: [facility];  Service: Endoscopy;  Laterality: N/A;  . ESOPHAGEAL DILATION N/A 08/23/2016   Procedure: ESOPHAGEAL DILATION;  Surgeon: Malissa Hippo, MD;  Location: [facility];   Service: Endoscopy;  Laterality: N/A;  . ESOPHAGOGASTRODUODENOSCOPY N/A 08/23/2016   Procedure: ESOPHAGOGASTRODUODENOSCOPY (EGD);  Surgeon: Malissa Hippo, MD;  [facility]: [facility];  Service: Endoscopy;  Laterality: N/A;  245-moved up to 100 per [name]  . EYE SURGERY Bilateral    cataract removal  . NECK SURGERY     Stenosis C6-C7  . SIGMOIDOSCOPY  2007   no polyps  . SPHINCTEROTOMY N/A 07/18/2013   Procedure: SPHINCTEROTOMY;  Surgeon: Malissa Hippo, MD;  Location: [facility];  Service: Endoscopy;  Laterality: N/A;  . SPHINCTEROTOMY  11/07/2013   Procedure: EXTENSION OF SPHINCTEROTOMY;  Surgeon: Malissa Hippo, MD;  Location: [facility];  Service: Endoscopy;;  . STENT REMOVAL N/A 11/07/2013   Procedure: STENT REMOVAL;  Surgeon: Malissa Hippo, MD;  Location: [facility];  Service: Endoscopy;  Laterality: N/A;    Family Psychiatric History: See below  Family History:  Family History  Problem Relation Age of Onset  . Diabetes Mother   . Coronary artery disease Father   . Gout Father   . Breast cancer Sister   . Pancreatic cancer Brother   . Alcohol abuse Brother   . Healthy Brother   . Lung cancer Sister   . Healthy Sister   . Depression Sister   . Hyperlipidemia Daughter   . Hyperlipidemia Son   . Bipolar disorder Son   . Healthy Sister   . Healthy Sister   . Seizures Other     Social History:  Social History   Socioeconomic History  . Marital status: Married    Spouse name: Not on file  . Number of children: Not on file  . Years of education: Not on file  . Highest education level: Not on file  Occupational History  . Not on file  Social Needs  . Financial resource strain: Not on file  . Food insecurity    Worry: Not on file    Inability: Not on file  . Transportation needs    Medical: Not on file    Non-medical: Not on file  Tobacco Use  . Smoking status: Never Smoker  . Smokeless tobacco: Never Used  Substance and Sexual Activity  . Alcohol use: No     Alcohol/week: 0.0 standard drinks  . Drug use: No  . Sexual activity: Not on file  Lifestyle  . Physical activity    Days per week: Not on file    Minutes per session: Not on file  . Stress: Not on file  Relationships  . Social Herbalist on phone: Not on file    Gets together: Not on file    Attends religious service: Not on file    Active member of club or organization: Not on file    Attends meetings of clubs or organizations: Not on file    Relationship status: Not on file  Other Topics Concern  . Not on file  Social History Narrative  . Not on file    Allergies:  Allergies  Allergen Reactions  . Codeine Nausea And Vomiting  . Ciprofloxacin     Nausea and Vomiting  . Flagyl [Metronidazole]     Nausea and vomiting  . Oxycodone Nausea And Vomiting    Metabolic Disorder Labs: No results found for: HGBA1C, MPG No results found for: PROLACTIN Lab Results  Component Value Date   TRIG 113 07/28/2013   Lab Results  Component Value Date   TSH 1.213 09/14/2014    Therapeutic Level Labs: No results found for: LITHIUM No results found for: VALPROATE No components found for:  CBMZ  Current Medications: Current Outpatient Medications  Medication Sig Dispense Refill  . ARIPiprazole (ABILIFY) 10 MG tablet Take 1 tablet (10 mg total) by mouth at bedtime. 90 tablet 2  . Ascorbic Acid (VITAMIN C PO) Take by mouth daily.    Marland Kitchen aspirin EC 81 MG tablet Take 81 mg by mouth daily.    . carvedilol (COREG) 12.5 MG tablet Take 12.5 mg by mouth 2 (two) times daily with a meal.     . Cholecalciferol (VITAMIN D) 400 UNITS capsule Take 400 Units by mouth daily.    . Fish Oil-Cholecalciferol (FISH OIL + D3 PO) Take 1 capsule by mouth 2 (two) times daily.     . furosemide (LASIX) 40 MG tablet TAKE 1 TABLET BY MOUTH EVERY DAY AS NEEDED 90 tablet 0  .  HYDROcodone-acetaminophen (NORCO/VICODIN) 5-325 MG tablet Take 1 tablet by mouth every 6 (six) hours as needed for moderate pain.  30 tablet 0  . HYDROcodone-acetaminophen (NORCO/VICODIN) 5-325 MG tablet Take 1 tablet by mouth every 6 (six) hours as needed for moderate pain. 30 tablet 0  . lamoTRIgine (LAMICTAL) 200 MG tablet Take 1 tablet (200 mg total) by mouth daily. 90 tablet 2  . meloxicam (MOBIC) 7.5 MG tablet Take 7.5 mg by mouth daily as needed for pain.    Marland Kitchen. omeprazole (PRILOSEC) 20 MG capsule TAKE 1 CAPSULE BY MOUTH TWICE A DAY BEFORE A MEAL 180 capsule 3  . ondansetron (ZOFRAN) 4 MG tablet TAKE 1 TABLET EVERY 8 HOURS AS NEEDED NAUSEA AND VOMITING 30 tablet 2  . ondansetron (ZOFRAN) 8 MG tablet TAKE 1 TABLET BY MOUTH 2 TIMES DAILY. AS NEEDED FOR NAUSEA AND VOMITING. 30 tablet 2  . polyethylene glycol powder (GLYCOLAX/MIRALAX) powder Take 17 grams daily prn for Constipation. 1581 g 3  . potassium chloride (MICRO-K) 10 MEQ CR capsule TAKE 2 CAPSULE BY MOUTH AS NEEDED ON DAYS MS Falesha TAKES THE DEMADEX 30 capsule 2  . simvastatin (ZOCOR) 20 MG tablet Take 20 mg by mouth every evening.     . vitamin B-12 (CYANOCOBALAMIN) 1000 MCG tablet Take 1,000 mcg by mouth daily.    Marland Kitchen. zolpidem (AMBIEN) 10 MG tablet Take 1 tablet (10 mg total) by mouth at bedtime. 90 tablet 5   No current facility-administered medications for this visit.      Musculoskeletal: Strength & Muscle Tone: within normal limits Gait & Station: normal Patient leans: N/A  Psychiatric Specialty Exam: Review of Systems  Musculoskeletal: Positive for back pain.  All other systems reviewed and are negative.   There were no vitals taken for this visit.There is no height or weight on file to calculate BMI.  General Appearance: NA  Eye Contact:  NA  Speech:  Clear and Coherent  Volume:  Normal  Mood:  Euthymic  Affect:  NA  Thought Process:  Goal Directed  Orientation:  Full (Time, Place, and Person)  Thought Content: WDL   Suicidal Thoughts:  No  Homicidal Thoughts:  No  Memory:  Immediate;   Good Recent;   Good Remote;   Good  Judgement:   Good  Insight:  Fair  Psychomotor Activity:  Decreased  Concentration:  Concentration: Good and Attention Span: Good  Recall:  Good  Fund of Knowledge: Good  Language: Good  Akathisia:  No  Handed:  Right  AIMS (if indicated): not done  Assets:  Communication Skills Desire for Improvement Resilience Social Support Talents/Skills  ADL's:  Intact  Cognition: WNL  Sleep:  Good   Screenings:   Assessment and Plan: This patient is a 78 year old female with a history of bipolar disorder.  For the most part she is very stable on her current regimen.  She will continue Abilify 10 mg at bedtime for mood stabilization, Lamictal 200 mg daily also for mood stabilization and Ambien 10 mg at bedtime for sleep.  She will return to see me in 6 months or call sooner if needed.   Diannia Rudereborah Keyasha Miah, MD 11/07/2018, 2:20 PM

## 2018-11-27 ENCOUNTER — Other Ambulatory Visit (INDEPENDENT_AMBULATORY_CARE_PROVIDER_SITE_OTHER): Payer: Self-pay | Admitting: Internal Medicine

## 2018-11-28 ENCOUNTER — Other Ambulatory Visit (INDEPENDENT_AMBULATORY_CARE_PROVIDER_SITE_OTHER): Payer: Self-pay | Admitting: *Deleted

## 2018-11-28 DIAGNOSIS — Z79899 Other long term (current) drug therapy: Secondary | ICD-10-CM

## 2018-11-28 DIAGNOSIS — Z5181 Encounter for therapeutic drug level monitoring: Secondary | ICD-10-CM

## 2018-11-28 DIAGNOSIS — T502X5D Adverse effect of carbonic-anhydrase inhibitors, benzothiadiazides and other diuretics, subsequent encounter: Secondary | ICD-10-CM

## 2018-11-28 NOTE — Telephone Encounter (Signed)
Needs met-7

## 2018-11-28 NOTE — Telephone Encounter (Signed)
B-Met has been ordered and the patient is aware.

## 2018-11-29 ENCOUNTER — Other Ambulatory Visit (INDEPENDENT_AMBULATORY_CARE_PROVIDER_SITE_OTHER): Payer: Self-pay | Admitting: Internal Medicine

## 2018-12-11 ENCOUNTER — Other Ambulatory Visit (INDEPENDENT_AMBULATORY_CARE_PROVIDER_SITE_OTHER): Payer: Self-pay | Admitting: Internal Medicine

## 2018-12-17 ENCOUNTER — Other Ambulatory Visit (INDEPENDENT_AMBULATORY_CARE_PROVIDER_SITE_OTHER): Payer: Self-pay | Admitting: Internal Medicine

## 2018-12-25 NOTE — Telephone Encounter (Signed)
Patient will need to have Metabolic Profile prior to further refills.

## 2018-12-31 ENCOUNTER — Other Ambulatory Visit (INDEPENDENT_AMBULATORY_CARE_PROVIDER_SITE_OTHER): Payer: Self-pay | Admitting: Internal Medicine

## 2019-02-21 ENCOUNTER — Other Ambulatory Visit (INDEPENDENT_AMBULATORY_CARE_PROVIDER_SITE_OTHER): Payer: Self-pay | Admitting: Internal Medicine

## 2019-02-21 NOTE — Telephone Encounter (Signed)
Please notify patient I sent a refill of lasix but she was last seen in 2019-lasix needs monitoring of kidney function. Will need to be seen or have pcp refill in future. Thanks

## 2019-04-14 ENCOUNTER — Telehealth (INDEPENDENT_AMBULATORY_CARE_PROVIDER_SITE_OTHER): Payer: Self-pay | Admitting: Internal Medicine

## 2019-04-14 NOTE — Telephone Encounter (Signed)
Spouse left message stating patient has had a flare up of diverticulitis - stated he thinks she should be seen sooner than 5/18 and wants to make Dr Karilyn Cota aware - please advise

## 2019-04-14 NOTE — Telephone Encounter (Signed)
Per Dr.Rehman , patient will need to be seen this month. He ask that she contact her PCP to be seen to see if she needs antibiotics.  I did make him aware of husband comment about office visit and also that the patient had canceled the last several appointments that she did have.

## 2019-04-17 NOTE — Telephone Encounter (Signed)
Mitzie add her to 3/25 at 3:45

## 2019-04-24 ENCOUNTER — Ambulatory Visit (INDEPENDENT_AMBULATORY_CARE_PROVIDER_SITE_OTHER): Payer: Medicare Other | Admitting: Internal Medicine

## 2019-04-28 ENCOUNTER — Encounter (INDEPENDENT_AMBULATORY_CARE_PROVIDER_SITE_OTHER): Payer: Self-pay | Admitting: Internal Medicine

## 2019-04-28 ENCOUNTER — Encounter (INDEPENDENT_AMBULATORY_CARE_PROVIDER_SITE_OTHER): Payer: Self-pay | Admitting: *Deleted

## 2019-04-28 ENCOUNTER — Other Ambulatory Visit (INDEPENDENT_AMBULATORY_CARE_PROVIDER_SITE_OTHER): Payer: Self-pay | Admitting: *Deleted

## 2019-04-28 ENCOUNTER — Other Ambulatory Visit: Payer: Self-pay

## 2019-04-28 ENCOUNTER — Ambulatory Visit (INDEPENDENT_AMBULATORY_CARE_PROVIDER_SITE_OTHER): Payer: Medicare Other | Admitting: Internal Medicine

## 2019-04-28 DIAGNOSIS — Z8719 Personal history of other diseases of the digestive system: Secondary | ICD-10-CM | POA: Insufficient documentation

## 2019-04-28 DIAGNOSIS — R600 Localized edema: Secondary | ICD-10-CM

## 2019-04-28 DIAGNOSIS — R1030 Lower abdominal pain, unspecified: Secondary | ICD-10-CM

## 2019-04-28 DIAGNOSIS — Z01812 Encounter for preprocedural laboratory examination: Secondary | ICD-10-CM

## 2019-04-28 MED ORDER — HYOSCYAMINE SULFATE SL 0.125 MG SL SUBL: 1.0000 | SUBLINGUAL_TABLET | Freq: Two times a day (BID) | SUBLINGUAL | 0 refills | Status: DC | PRN

## 2019-04-28 NOTE — Progress Notes (Signed)
Presenting complaint;  Lower abdominal pain.  Database and subjective:  Patient is 79 year old Caucasian female who is here for scheduled visit accompanied by her husband Lanny Hurst. She has a history of complicated diverticulitis.  She was hospitalized at [facility] for diverticulitis with small abscess.  She responded to antibiotic therapy.  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  She had anastomotic leak requiring 2 weeks of IV antibiotics.  She had takedown of ileostomy in September 17, 2017. She was experiencing lower abdominal pain with nausea.  She had abdominal pelvic CT in October 2019 which suggested focal inflammatory changes involving small bowel and central portion of her abdomen.  She returned for CT enterography and November 2019 revealing postoperative changes to mid to distal ileum as well as sigmoid colon with slight inflammatory stranding around the anastomosis but there was no fluid collection or fistulae.  She was also noted to have sigmoid diverticulosis without diverticulitis. When patient was last seen in the office in December 2019 she was feeling better.  Patient continues to experience intermittent lower abdominal pain which is always associated with nausea and chills.  Her husband has been checking her temperature during these episodes but she is never febrile.  She is pain-free today.  These episodes occur every few days or couple of weeks.  These episodes last for half a day to 2 days.  She says pain is not like cramps or sharp pain.  She says she has had more pain over the last 6 months.  Her bowels move every other day.  She is using polyethylene glycol on as-needed basis.  She also complains of back pain.  She has undergone steroid injection and nerve block but it has not helped.  She is under care of Dr. Thalia Bloodgood of pain management in [place].  She feels she may require surgery because of constant pain.  She also  complains of increasing lower extremity edema.  She was recently found to have venous incompetence and left leg.  She was supposed to have a procedure but her insurance denied the request.  She is using Lasix 2-3 times a week.  She takes potassium when she takes fluid pill.  She has gained 34 pounds since her last visit.  On her last visit she weighed 158 pounds but she had lost close to 15 to 20 pounds because of her illness. She denies vomiting hematuria or vaginal bleeding.  She says her appetite is normal and she eats 3 meals a day. She says she and her husband both have received both doses of Covid vaccine.  Current Medications: Outpatient Encounter Medications as of 04/28/2019  Medication Sig  . ARIPiprazole (ABILIFY) 10 MG tablet Take 1 tablet (10 mg total) by mouth at bedtime.  Marland Kitchen aspirin EC 81 MG tablet Take 81 mg by mouth daily.  . Cholecalciferol (VITAMIN D) 400 UNITS capsule Take 400 Units by mouth daily.  . Fish Oil-Cholecalciferol (FISH OIL + D3 PO) Take 1 capsule by mouth 2 (two) times daily.   . furosemide (LASIX) 40 MG tablet TAKE 1 TABLET BY MOUTH EVERY DAY AS NEEDED  . HYDROcodone-acetaminophen (NORCO/VICODIN) 5-325 MG tablet Take 1 tablet by mouth every 6 (six) hours as needed for moderate pain.  Marland Kitchen lamoTRIgine (LAMICTAL) 200 MG tablet Take 1 tablet (200 mg total) by mouth daily.  Marland Kitchen losartan (COZAAR) 100 MG tablet 100 mg daily.   . meloxicam (MOBIC) 7.5 MG tablet Take  7.5 mg by mouth daily as needed for pain.  . metoprolol tartrate (LOPRESSOR) 25 MG tablet Take 12.5 mg by mouth 2 (two) times daily.   Marland Kitchen NIFEdipine (ADALAT CC) 90 MG 24 hr tablet Take 90 mg by mouth daily.   Marland Kitchen omeprazole (PRILOSEC) 20 MG capsule TAKE 1 CAPSULE BY MOUTH TWICE A DAY BEFORE A MEAL  . ondansetron (ZOFRAN) 8 MG tablet TAKE 1 TABLET BY MOUTH 2 TIMES DAILY. AS NEEDED FOR NAUSEA AND VOMITING.  . polyethylene glycol powder (GLYCOLAX/MIRALAX) powder Take 17 grams daily prn for Constipation.  . potassium  chloride (MICRO-K) 10 MEQ CR capsule Take 2 capsules (20 mEq total) by mouth daily as needed (Take potassium on days when you take torsemide.).  Marland Kitchen simvastatin (ZOCOR) 20 MG tablet Take 20 mg by mouth every evening.   . vitamin B-12 (CYANOCOBALAMIN) 1000 MCG tablet Take 1,000 mcg by mouth daily.  Marland Kitchen zolpidem (AMBIEN) 10 MG tablet Take 1 tablet (10 mg total) by mouth at bedtime.  Marland Kitchen HYDROcodone-acetaminophen (NORCO/VICODIN) 5-325 MG tablet Take 1 tablet by mouth every 6 (six) hours as needed for moderate pain. (Patient not taking: Reported on 04/28/2019)  . ondansetron (ZOFRAN) 4 MG tablet TAKE 1 TABLET EVERY 8 HOURS AS NEEDED NAUSEA AND VOMITING  . [DISCONTINUED] Ascorbic Acid (VITAMIN C PO) Take by mouth daily.  . [DISCONTINUED] carvedilol (COREG) 12.5 MG tablet Take 12.5 mg by mouth 2 (two) times daily with a meal.    No facility-administered encounter medications on file as of 04/28/2019.    Objective: Blood pressure (!) 171/68, pulse (!) 43, temperature (!) 97.3 F (36.3 C), temperature source Temporal, height 5\' 4"  (1.626 m), weight 192 lb 4.8 oz (87.2 kg). Patient is alert and in no acute distress. Patient is wearing facial mask. Conjunctiva is pink. Sclera is nonicteric Oropharyngeal mucosa is normal. No neck masses or thyromegaly noted. Cardiac exam with regular rhythm normal S1 and S2. No murmur or gallop noted. Lungs are clear to auscultation. Abdomen is full.  Bowel sounds are normal.  No bruits noted.  She has small umbilical hernia.  She has horizontal scar in right lower quadrant of her abdomen and midline scar covering around left of umbilicus.  On palpation abdomen is soft and nontender without organomegaly or masses. Patient has 2+ pitting edema involving both legs.  Left greater than right.   Assessment:  #1.  Chronic lower abdominal pain.  She has had this pain since her ileostomy was reversed back in August 2019.  This pain is associated with nausea and chills but no fever.   No change in her bowel habits with this pain such as diarrhea and or constipation.  Clinical course is not consistent with diverticulitis.  She could be having intermittent intussusception or pain due to adhesions.  #2.  Lower extremity edema.  Patient advised to contact Dr. September 2019 office regarding progressive lower extremity edema.  #3.  History of sigmoid diverticulitis.  Status post sigmoid colon resection with loop ileostomy in March 2019 with takedown of ileostomy in August 2019.   Plan:  Patient advised to take polyethylene glycol on schedule which can be daily or every other day and she can titrate the dose up or down. Abdominal pelvic CT with IV contrast.  Radiology staff alerted that we with access may be difficult. Levsin SL 1 to 2 tablets twice daily as needed.  Patient was informed of potential side effects but if she has any she is stop the medication. Further recommendations to  follow. Office visit in 3 months.

## 2019-04-28 NOTE — Patient Instructions (Signed)
Abdominopelvic CT to be scheduled. Please take polyethylene glycol on schedule which could be daily or every other day.  You can titrate dose as tolerated.

## 2019-04-29 ENCOUNTER — Other Ambulatory Visit (INDEPENDENT_AMBULATORY_CARE_PROVIDER_SITE_OTHER): Payer: Self-pay | Admitting: Internal Medicine

## 2019-04-29 LAB — CBC WITH DIFFERENTIAL/PLATELET
Absolute Monocytes: 672 cells/uL (ref 200–950)
Basophils Absolute: 32 cells/uL (ref 0–200)
Basophils Relative: 0.4 %
Eosinophils Absolute: 81 cells/uL (ref 15–500)
Eosinophils Relative: 1 %
HCT: 36.8 % (ref 35.0–45.0)
Hemoglobin: 12 g/dL (ref 11.7–15.5)
Lymphs Abs: 1482 cells/uL (ref 850–3900)
MCH: 27.7 pg (ref 27.0–33.0)
MCHC: 32.6 g/dL (ref 32.0–36.0)
MCV: 85 fL (ref 80.0–100.0)
MPV: 10.6 fL (ref 7.5–12.5)
Monocytes Relative: 8.3 %
Neutro Abs: 5832 cells/uL (ref 1500–7800)
Neutrophils Relative %: 72 %
Platelets: 272 10*3/uL (ref 140–400)
RBC: 4.33 10*6/uL (ref 3.80–5.10)
RDW: 14.9 % (ref 11.0–15.0)
Total Lymphocyte: 18.3 %
WBC: 8.1 10*3/uL (ref 3.8–10.8)

## 2019-04-29 LAB — COMPREHENSIVE METABOLIC PANEL
AG Ratio: 1.4 (calc) (ref 1.0–2.5)
ALT: 13 U/L (ref 6–29)
AST: 17 U/L (ref 10–35)
Albumin: 4.2 g/dL (ref 3.6–5.1)
Alkaline phosphatase (APISO): 109 U/L (ref 37–153)
BUN/Creatinine Ratio: 24 (calc) — ABNORMAL HIGH (ref 6–22)
BUN: 28 mg/dL — ABNORMAL HIGH (ref 7–25)
CO2: 25 mmol/L (ref 20–32)
Calcium: 9.4 mg/dL (ref 8.6–10.4)
Chloride: 105 mmol/L (ref 98–110)
Creat: 1.16 mg/dL — ABNORMAL HIGH (ref 0.60–0.93)
Globulin: 2.9 g/dL (calc) (ref 1.9–3.7)
Glucose, Bld: 106 mg/dL — ABNORMAL HIGH (ref 65–99)
Potassium: 4.5 mmol/L (ref 3.5–5.3)
Sodium: 140 mmol/L (ref 135–146)
Total Bilirubin: 0.4 mg/dL (ref 0.2–1.2)
Total Protein: 7.1 g/dL (ref 6.1–8.1)

## 2019-05-08 ENCOUNTER — Encounter (HOSPITAL_COMMUNITY): Payer: Self-pay | Admitting: Psychiatry

## 2019-05-08 ENCOUNTER — Ambulatory Visit (INDEPENDENT_AMBULATORY_CARE_PROVIDER_SITE_OTHER): Payer: Medicare Other | Admitting: Psychiatry

## 2019-05-08 ENCOUNTER — Other Ambulatory Visit: Payer: Self-pay

## 2019-05-08 ENCOUNTER — Ambulatory Visit (HOSPITAL_COMMUNITY)
Admission: RE | Admit: 2019-05-08 | Discharge: 2019-05-08 | Disposition: A | Discharge status: Home / Self Care | Payer: Medicare Other | Source: Ambulatory Visit | Attending: Internal Medicine | Admitting: Internal Medicine

## 2019-05-08 DIAGNOSIS — R1030 Lower abdominal pain, unspecified: Secondary | ICD-10-CM | POA: Diagnosis present

## 2019-05-08 DIAGNOSIS — F3130 Bipolar disorder, current episode depressed, mild or moderate severity, unspecified: Secondary | ICD-10-CM

## 2019-05-08 MED ORDER — IOHEXOL 300 MG/ML  SOLN
80.0000 mL | Freq: Once | INTRAMUSCULAR | Status: AC | PRN
  Administered 2019-05-08: 75 mL via INTRAVENOUS

## 2019-05-08 MED ORDER — ARIPIPRAZOLE 10 MG PO TABS: 10.0000 mg | ORAL_TABLET | Freq: Every day | ORAL | 2 refills | Status: DC

## 2019-05-08 MED ORDER — ZOLPIDEM TARTRATE 10 MG PO TABS: 10.0000 mg | ORAL_TABLET | Freq: Every day | ORAL | 2 refills | Status: DC

## 2019-05-08 MED ORDER — LAMOTRIGINE 200 MG PO TABS: 200.0000 mg | ORAL_TABLET | Freq: Every day | ORAL | 2 refills | Status: DC

## 2019-05-08 NOTE — Progress Notes (Signed)
Virtual Visit via Telephone Note  I connected with Lindsay Palmer on 05/08/19 at  2:00 PM EDT by telephone and verified that I am speaking with the correct person using two identifiers.   I discussed the limitations, risks, security and privacy concerns of performing an evaluation and management service by telephone and the availability of in person appointments. I also discussed with the patient that there may be a patient responsible charge related to this service. The patient expressed understanding and agreed to proceed.    I discussed the assessment and treatment plan with the patient. The patient was provided an opportunity to ask questions and all were answered. The patient agreed with the plan and demonstrated an understanding of the instructions.   The patient was advised to call back or seek an in-person evaluation if the symptoms worsen or if the condition fails to improve as anticipated.  I provided 15 minutes of non-face-to-face time during this encounter.   Diannia Ruder, MD  [facility] MD/PA/NP OP Progress Note  05/08/2019 2:09 PM Lindsay Palmer  MRN:  [id number]  Chief Complaint:  Chief Complaint    Depression; Anxiety; Manic Behavior; Follow-up     HPI: This patient is a 79 year old married white female who lives with her husband in [place].  She is retired from [employer]  She has a son daughter and 3 grandchildren.  The patient returns for follow-up for treatment of bipolar disorder.  She was last seen 6 months ago.  She states overall her mood has been stable.  She denies any symptoms of depression thoughts of self-harm or suicidal ideation.  She denies any manic symptoms such as racing thoughts or delusions or auditory or visual vaccinations or mood shifting.  She is sleeping fairly well most of the time with the Ambien.  She is still having trouble with diverticulitis and also with chronic back pain but other than that she feels that she is doing well. Visit  Diagnosis:    ICD-10-CM   1. Bipolar I disorder, most recent episode depressed (HCC)  F31.30     Past Psychiatric History: Long-term outpatient treatment for bipolar disorder  Past Medical History:  Past Medical History:  Diagnosis Date  . Bipolar 2 disorder (HCC)   . Chronic insomnia   . Diverticulosis of colon   . Elevated LFTs   . Fatty liver disease, nonalcoholic   . GERD (gastroesophageal reflux disease)   . H/O first degree atrioventricular block   . Hypercholesteremia   . Hypertension   . Major depressive disorder   . Migraine   . Osteopenia   . Pancreatitis   . Sleep apnea    Stop Bang score of 4. Pt said she had sleep apnea at one time, but she has had another sleep study since then and they said she does not have it anymore.  . Vitamin D deficiency     Past Surgical History:  Procedure Laterality Date  . BILIARY STENT PLACEMENT N/A 07/18/2013   Procedure: BILIARY STENT PLACEMENT;  Surgeon: Malissa Hippo, MD;  Location: [facility];  Service: Endoscopy;  Laterality: N/A;  . Cataract Surgery Bilateral 08-2012  . CHOLECYSTECTOMY    . COLONOSCOPY  2003   Diverticulitis h/o polyps -due next 2015 -gastroscopy 2003  . ERCP    . ERCP N/A 07/18/2013   Procedure: ENDOSCOPIC RETROGRADE CHOLANGIOPANCREATOGRAPHY (ERCP) COMMON BILE DUCT BRUSHING;  Surgeon: Malissa Hippo, MD;  Location: [facility];  Service: Endoscopy;  Laterality: N/A;  . ERCP  N/A 11/07/2013   Procedure: ENDOSCOPIC RETROGRADE CHOLANGIOPANCREATOGRAPHY (ERCP);  Surgeon: Malissa Hippo, MD;  Location: [facility];  Service: Endoscopy;  Laterality: N/A;  . ESOPHAGEAL DILATION N/A 08/23/2016   Procedure: ESOPHAGEAL DILATION;  Surgeon: Malissa Hippo, MD;  Location[facility];  Service: Endoscopy;  Laterality: N/A;  . ESOPHAGOGASTRODUODENOSCOPY N/A 08/23/2016   Procedure: ESOPHAGOGASTRODUODENOSCOPY (EGD);  Surgeon: Malissa Hippo, MD;  [facility]: [facility];  Service: Endoscopy;  Laterality: N/A;  245-moved up to 100 per  [name]  . EYE SURGERY Bilateral    cataract removal  . NECK SURGERY     Stenosis C6-C7  . SIGMOIDOSCOPY  2007   no polyps  . SPHINCTEROTOMY N/A 07/18/2013   Procedure: SPHINCTEROTOMY;  Surgeon: Malissa Hippo, MD;  Location: [facility];  Service: Endoscopy;  Laterality: N/A;  . SPHINCTEROTOMY  11/07/2013   Procedure: EXTENSION OF SPHINCTEROTOMY;  Surgeon: Malissa Hippo, MD;  Location: [facility];  Service: Endoscopy;;  . STENT REMOVAL N/A 11/07/2013   Procedure: STENT REMOVAL;  Surgeon: Malissa Hippo, MD;  Location: [facility];  Service: Endoscopy;  Laterality: N/A;    Family Psychiatric History: see below  Family History:  Family History  Problem Relation Age of Onset  . Diabetes Mother   . Coronary artery disease Father   . Gout Father   . Breast cancer Sister   . Pancreatic cancer Brother   . Alcohol abuse Brother   . Healthy Brother   . Lung cancer Sister   . Healthy Sister   . Depression Sister   . Hyperlipidemia Daughter   . Hyperlipidemia Son   . Bipolar disorder Son   . Healthy Sister   . Healthy Sister   . Seizures Other     Social History:  Social History   Socioeconomic History  . Marital status: Married    Spouse name: Not on file  . Number of children: Not on file  . Years of education: Not on file  . Highest education level: Not on file  Occupational History  . Not on file  Tobacco Use  . Smoking status: Never Smoker  . Smokeless tobacco: Never Used  Substance and Sexual Activity  . Alcohol use: No    Alcohol/week: 0.0 standard drinks  . Drug use: No  . Sexual activity: Not on file  Other Topics Concern  . Not on file  Social History Narrative  . Not on file   Social Determinants of Health   Financial Resource Strain:   . Difficulty of Paying Living Expenses:   Food Insecurity:   . Worried About Programme researcher, broadcasting/film/video in the Last Year:   . Barista in the Last Year:   Transportation Needs:   . Freight forwarder (Medical):   Marland Kitchen Lack of  Transportation (Non-Medical):   Physical Activity:   . Days of Exercise per Week:   . Minutes of Exercise per Session:   Stress:   . Feeling of Stress :   Social Connections:   . Frequency of Communication with Friends and Family:   . Frequency of Social Gatherings with Friends and Family:   . Attends Religious Services:   . Active Member of Clubs or Organizations:   . Attends Banker Meetings:   Marland Kitchen Marital Status:     Allergies:  Allergies  Allergen Reactions  . Codeine Nausea And Vomiting  . Ciprofloxacin     Nausea and Vomiting  . Flagyl [Metronidazole]  Nausea and vomiting  . Oxycodone Nausea And Vomiting    Metabolic Disorder Labs: No results found for: HGBA1C, MPG No results found for: PROLACTIN Lab Results  Component Value Date   TRIG 113 07/28/2013   Lab Results  Component Value Date   TSH 1.213 09/14/2014    Therapeutic Level Labs: No results found for: LITHIUM No results found for: VALPROATE No components found for:  CBMZ  Current Medications: Current Outpatient Medications  Medication Sig Dispense Refill  . ARIPiprazole (ABILIFY) 10 MG tablet Take 1 tablet (10 mg total) by mouth at bedtime. 90 tablet 2  . aspirin EC 81 MG tablet Take 81 mg by mouth daily.    . Cholecalciferol (VITAMIN D) 400 UNITS capsule Take 400 Units by mouth daily.    . Fish Oil-Cholecalciferol (FISH OIL + D3 PO) Take 1 capsule by mouth 2 (two) times daily.     . furosemide (LASIX) 40 MG tablet TAKE 1 TABLET BY MOUTH EVERY DAY AS NEEDED 90 tablet 0  . HYDROcodone-acetaminophen (NORCO/VICODIN) 5-325 MG tablet Take 1 tablet by mouth every 6 (six) hours as needed for moderate pain. (Patient not taking: Reported on 04/28/2019) 30 tablet 0  . HYDROcodone-acetaminophen (NORCO/VICODIN) 5-325 MG tablet Take 1 tablet by mouth every 6 (six) hours as needed for moderate pain. 30 tablet 0  . hyoscyamine (LEVSIN SL) 0.125 MG SL tablet PLACE 1-2 TABLETS UNDER THE TONGUE 2 (TWO)  TIMES DAILY AS NEEDED. 60 tablet 1  . lamoTRIgine (LAMICTAL) 200 MG tablet Take 1 tablet (200 mg total) by mouth daily. 90 tablet 2  . losartan (COZAAR) 100 MG tablet 100 mg daily.     . meloxicam (MOBIC) 7.5 MG tablet Take 7.5 mg by mouth daily as needed for pain.    . metoprolol tartrate (LOPRESSOR) 25 MG tablet Take 12.5 mg by mouth 2 (two) times daily.     Marland Kitchen NIFEdipine (ADALAT CC) 90 MG 24 hr tablet Take 90 mg by mouth daily.     Marland Kitchen omeprazole (PRILOSEC) 20 MG capsule TAKE 1 CAPSULE BY MOUTH TWICE A DAY BEFORE A MEAL 180 capsule 3  . ondansetron (ZOFRAN) 4 MG tablet TAKE 1 TABLET EVERY 8 HOURS AS NEEDED NAUSEA AND VOMITING 30 tablet 2  . ondansetron (ZOFRAN) 8 MG tablet TAKE 1 TABLET BY MOUTH 2 TIMES DAILY. AS NEEDED FOR NAUSEA AND VOMITING. 30 tablet 2  . polyethylene glycol powder (GLYCOLAX/MIRALAX) powder Take 17 grams daily prn for Constipation. 1581 g 3  . potassium chloride (MICRO-K) 10 MEQ CR capsule Take 2 capsules (20 mEq total) by mouth daily as needed (Take potassium on days when you take torsemide.). 180 capsule 0  . simvastatin (ZOCOR) 20 MG tablet Take 20 mg by mouth every evening.     . vitamin B-12 (CYANOCOBALAMIN) 1000 MCG tablet Take 1,000 mcg by mouth daily.    Marland Kitchen zolpidem (AMBIEN) 10 MG tablet Take 1 tablet (10 mg total) by mouth at bedtime. 90 tablet 2   No current facility-administered medications for this visit.     Musculoskeletal: Strength & Muscle Tone: within normal limits Gait & Station: normal Patient leans: N/A  Psychiatric Specialty Exam: Review of Systems  Gastrointestinal: Positive for abdominal pain.  Musculoskeletal: Positive for back pain.  All other systems reviewed and are negative.   There were no vitals taken for this visit.There is no height or weight on file to calculate BMI.  General Appearance: NA  Eye Contact:  NA  Speech:  Clear and Coherent  Volume:  Normal  Mood:  Euthymic  Affect:  NA  Thought Process:  Goal Directed   Orientation:  Full (Time, Place, and Person)  Thought Content: WDL   Suicidal Thoughts:  No  Homicidal Thoughts:  No  Memory:  Immediate;   Good Recent;   Good Remote;   NA  Judgement:  Good  Insight:  Fair  Psychomotor Activity:  Decreased  Concentration:  Concentration: Good and Attention Span: Good  Recall:  Good  Fund of Knowledge: Good  Language: Good  Akathisia:  No  Handed:  Right  AIMS (if indicated): not done  Assets:  Communication Skills Desire for Improvement Resilience Social Support Talents/Skills  ADL's:  Intact  Cognition: WNL  Sleep:  Fair   Screenings:   Assessment and Plan: This patient is a 79 year old female with a history of bipolar disorder.  She seems to be stable on her current regimen.  She will continue Abilify 10 mg bedtime for mood stabilization, Lamictal 200 mg daily for mood stabilization Ambien 10 mg at bedtime for sleep.  She will return to see me in 6 months   Diannia Ruder, MD 05/08/2019, 2:09 PM

## 2019-05-14 ENCOUNTER — Other Ambulatory Visit (INDEPENDENT_AMBULATORY_CARE_PROVIDER_SITE_OTHER): Payer: Self-pay | Admitting: Internal Medicine

## 2019-06-17 ENCOUNTER — Other Ambulatory Visit: Payer: Self-pay

## 2019-06-17 ENCOUNTER — Ambulatory Visit (INDEPENDENT_AMBULATORY_CARE_PROVIDER_SITE_OTHER): Payer: Medicare Other | Admitting: Internal Medicine

## 2019-06-17 ENCOUNTER — Encounter (INDEPENDENT_AMBULATORY_CARE_PROVIDER_SITE_OTHER): Payer: Self-pay | Admitting: Internal Medicine

## 2019-06-17 VITALS — BP 127/70 | HR 66 | Temp 97.1°F | Ht 64.0 in | Wt 189.4 lb

## 2019-06-17 DIAGNOSIS — K219 Gastro-esophageal reflux disease without esophagitis: Secondary | ICD-10-CM | POA: Diagnosis not present

## 2019-06-17 DIAGNOSIS — K5900 Constipation, unspecified: Secondary | ICD-10-CM

## 2019-06-17 DIAGNOSIS — R1030 Lower abdominal pain, unspecified: Secondary | ICD-10-CM | POA: Diagnosis not present

## 2019-06-17 MED ORDER — POTASSIUM CHLORIDE ER 10 MEQ PO CPCR: 20.0000 meq | ORAL_CAPSULE | Freq: Every day | ORAL | 0 refills | Status: DC | PRN

## 2019-06-17 NOTE — Patient Instructions (Signed)
Take Levsin/hyoscyamine sublingual 5 minutes before each meal Take polyethylene glycol on schedule rather that is every other day daily or skip every third day as discussed. Please do not take meloxicam anymore as it is not helping Please call office with progress report in 3 to 4 weeks.

## 2019-06-17 NOTE — Progress Notes (Signed)
Presenting complaint;  Abdominal pain and constipation.  History of GERD.  Database and subjective:  Patient is 79 year old Caucasian female who is here for scheduled visit.  She was last seen on 04/28/2019 for intermittent lower abdominal pain associated with chills and nausea.  She has a history of complicated sigmoid diverticulitis for which she had surgery in 4098 complications.  Abdominal pelvic CT was obtained on 05/09/2019.  She has small umbilical hernia containing loop of small bowel.  There was marked diastases of infraumbilical ventral abdominal wall and atherosclerosis but no abnormality noted to account for her pain. Patient is accompanied by her husband Lanny Hurst. She continues to have episodic abdominal pain.  Pain is located across lower half of the abdomen.  It is not intractable or severe.  She states she had a daily for several days but did not have any for about a week.  Pain tends to occur after meals.  She has been using hyoscyamine which seems to help.  Her husband states that he has checked her temperature when she has pain and she has never been febrile.  She is using polyethylene glycol on as-needed basis and not every day.  She remains with lower extremity edema.  She had laser therapy to seal all leaking valve in left leg about 6 weeks ago but this has not made any difference.  She is taking fluid medication on as-needed basis.  She was seen by Ms. Shari Prows, NP and was advised to stop meloxicam.  She says it never did help her back pain. She says heartburn is well controlled with therapy. She remains with good appetite.  Her weight is down by 3 pounds since her last visit. Her husband states that she does not do any physical activity because of back pain.  She has had injections to her back as well as nerve block and it has not helped so far.  Current Medications: Outpatient Encounter Medications as of 06/17/2019  Medication Sig  . ARIPiprazole (ABILIFY) 10 MG tablet Take 1  tablet (10 mg total) by mouth at bedtime.  Marland Kitchen aspirin EC 81 MG tablet Take 81 mg by mouth daily.  . Cholecalciferol (VITAMIN D) 400 UNITS capsule Take 400 Units by mouth daily.  . Fish Oil-Cholecalciferol (FISH OIL + D3 PO) Take 1 capsule by mouth 2 (two) times daily.   . furosemide (LASIX) 40 MG tablet TAKE 1 TABLET BY MOUTH EVERY DAY AS NEEDED  . hyoscyamine (LEVSIN SL) 0.125 MG SL tablet PLACE 1-2 TABLETS UNDER THE TONGUE 2 (TWO) TIMES DAILY AS NEEDED.  Marland Kitchen lamoTRIgine (LAMICTAL) 200 MG tablet Take 1 tablet (200 mg total) by mouth daily.  Marland Kitchen losartan (COZAAR) 100 MG tablet 100 mg daily.   . meloxicam (MOBIC) 7.5 MG tablet Take 7.5 mg by mouth daily as needed for pain.  . metoprolol tartrate (LOPRESSOR) 25 MG tablet Take 12.5 mg by mouth 2 (two) times daily.   Marland Kitchen NIFEdipine (ADALAT CC) 90 MG 24 hr tablet Take 90 mg by mouth daily.   Marland Kitchen omeprazole (PRILOSEC) 20 MG capsule TAKE 1 CAPSULE BY MOUTH TWICE A DAY BEFORE A MEAL  . ondansetron (ZOFRAN) 4 MG tablet TAKE 1 TABLET EVERY 8 HOURS AS NEEDED NAUSEA AND VOMITING  . polyethylene glycol powder (GLYCOLAX/MIRALAX) powder Take 17 grams daily prn for Constipation.  . potassium chloride (MICRO-K) 10 MEQ CR capsule Take 2 capsules (20 mEq total) by mouth daily as needed (Take potassium on days when you take torsemide.).  Marland Kitchen simvastatin (ZOCOR)  20 MG tablet Take 20 mg by mouth every evening.   . vitamin B-12 (CYANOCOBALAMIN) 1000 MCG tablet Take 1,000 mcg by mouth daily.  Marland Kitchen zolpidem (AMBIEN) 10 MG tablet Take 1 tablet (10 mg total) by mouth at bedtime.  Marland Kitchen HYDROcodone-acetaminophen (NORCO/VICODIN) 5-325 MG tablet Take 1 tablet by mouth every 6 (six) hours as needed for moderate pain. (Patient not taking: Reported on 06/17/2019)  . HYDROcodone-acetaminophen (NORCO/VICODIN) 5-325 MG tablet Take 1 tablet by mouth every 6 (six) hours as needed for moderate pain. (Patient not taking: Reported on 06/17/2019)  . [DISCONTINUED] ondansetron (ZOFRAN) 8 MG tablet TAKE 1  TABLET BY MOUTH 2 TIMES DAILY. AS NEEDED FOR NAUSEA AND VOMITING. (Patient not taking: Reported on 06/17/2019)   No facility-administered encounter medications on file as of 06/17/2019.     Objective: Blood pressure 127/70, pulse 66, temperature (!) 97.1 F (36.2 C), height '5\' 4"'$  (1.626 m), weight 189 lb 6.4 oz (85.9 kg).  Patient is alert and in no acute distress. Patient is wearing facial mask. She has a scabbing right temple region. Conjunctiva is pink. Sclera is nonicteric Oropharyngeal mucosa is normal. No neck masses or thyromegaly noted. Cardiac exam with regular rhythm normal S1 and S2. No murmur or gallop noted. Lungs are clear to auscultation. Abdomen is full.  Bowel sounds are normal.  She has small umbilical hernia which is completely reducible.  She has horizontal scar in right mid abdomen site of colostomy and lower midline scar covering around left of umbilicus.  Abdomen is soft and nontender with organomegaly or masses. She remains with 2+ pitting edema involving both legs.  Labs/studies Results:  CBC Latest Ref Rng & Units 04/28/2019 01/25/2017 01/11/2017  WBC 3.8 - 10.8 Thousand/uL 8.1 6.4 11.2(H)  Hemoglobin 11.7 - 15.5 g/dL 12.0 11.7 9.7(L)  Hematocrit 35.0 - 45.0 % 36.8 35.1 31.6(L)  Platelets 140 - 400 Thousand/uL 272 359 286    CMP Latest Ref Rng & Units 04/28/2019 12/24/2017 11/08/2017  Glucose 65 - 99 mg/dL 106(H) - 90  BUN 7 - 25 mg/dL 28(H) - 14  Creatinine 0.60 - 0.93 mg/dL 1.16(H) 1.00 0.95(H)  Sodium 135 - 146 mmol/L 140 - 143  Potassium 3.5 - 5.3 mmol/L 4.5 - 3.8  Chloride 98 - 110 mmol/L 105 - 102  CO2 20 - 32 mmol/L 25 - 31  Calcium 8.6 - 10.4 mg/dL 9.4 - 8.9  Total Protein 6.1 - 8.1 g/dL 7.1 - 6.4  Total Bilirubin 0.2 - 1.2 mg/dL 0.4 - 0.5  Alkaline Phos 38 - 126 U/L - - -  AST 10 - 35 U/L 17 - 12  ALT 6 - 29 U/L 13 - 7    Hepatic Function Latest Ref Rng & Units 04/28/2019 11/08/2017 01/25/2017  Total Protein 6.1 - 8.1 g/dL 7.1 6.4 6.6   Albumin 3.5 - 5.0 g/dL - - -  AST 10 - 35 U/L '17 12 18  '$ ALT 6 - 29 U/L '13 7 11  '$ Alk Phosphatase 38 - 126 U/L - - -  Total Bilirubin 0.2 - 1.2 mg/dL 0.4 0.5 0.3  Bilirubin, Direct 0.0 - 0.2 mg/dL - - 0.1    CT images from 05/09/2019 reviewed. Small umbilical hernia containing a loop of small bowel.  No evidence of small or large bowel dilation.  No adenopathy or ascites.  Assessment:  #1.  Episodic lower abdominal pain which is usually triggered by meals.  It is associated with nausea and chills but no fever or vomiting.  Recent CT does not show any abnormality to account for her symptoms.  She is getting relief with as needed hyoscyamine.  I wonder if she has intermittent intussusception.  #2.  GERD.  Patient is doing well with therapy.  #3.  Chronic constipation most likely related to her medications.  Once again patient advised to take polyethylene glycol on schedule whether that is daily or every other day and she can titrate her dose to desired effect.  #4.  Lower extremity edema most likely due to venous incompetence.  Patient needs to follow-up with Dr. Alroy Dust to determine if there is any other remedy for this.  Plan:  Prescription for KCl sent to patient's pharmacy.  She is taking it on days when she takes furosemide. Patient advised to try hyoscyamine sublingual 5 minutes before each meal and see if he can prevent these episodes abdominal pain. Patient advised to take polyethylene glycol half to 1 scoop daily or every other day. If patient has an episode of severe abdominal pain she should report to emergency room for urgent evaluation. Office visit in 4 months.

## 2019-06-24 ENCOUNTER — Other Ambulatory Visit (INDEPENDENT_AMBULATORY_CARE_PROVIDER_SITE_OTHER): Payer: Self-pay | Admitting: Gastroenterology

## 2019-06-24 MED ORDER — ONDANSETRON HCL 4 MG PO TABS: 4.0000 mg | ORAL_TABLET | Freq: Three times a day (TID) | ORAL | 2 refills | Status: DC | PRN

## 2019-06-30 ENCOUNTER — Other Ambulatory Visit (HOSPITAL_COMMUNITY): Payer: Self-pay | Admitting: Psychiatry

## 2019-07-04 ENCOUNTER — Other Ambulatory Visit (INDEPENDENT_AMBULATORY_CARE_PROVIDER_SITE_OTHER): Payer: Self-pay | Admitting: Gastroenterology

## 2019-07-04 MED ORDER — ONDANSETRON HCL 4 MG PO TABS: 4.0000 mg | ORAL_TABLET | Freq: Three times a day (TID) | ORAL | 2 refills | Status: DC | PRN

## 2019-07-04 NOTE — Progress Notes (Signed)
Refill request sent to pharmacy.

## 2019-07-19 ENCOUNTER — Other Ambulatory Visit (INDEPENDENT_AMBULATORY_CARE_PROVIDER_SITE_OTHER): Payer: Self-pay | Admitting: Internal Medicine

## 2019-07-19 DIAGNOSIS — K219 Gastro-esophageal reflux disease without esophagitis: Secondary | ICD-10-CM

## 2019-08-05 ENCOUNTER — Ambulatory Visit (INDEPENDENT_AMBULATORY_CARE_PROVIDER_SITE_OTHER): Payer: Medicare Other | Admitting: Internal Medicine

## 2019-08-07 ENCOUNTER — Telehealth (INDEPENDENT_AMBULATORY_CARE_PROVIDER_SITE_OTHER): Payer: Self-pay | Admitting: *Deleted

## 2019-08-07 NOTE — Telephone Encounter (Signed)
I called and discussed w/ husband and patient. Reports abdominal pain (lower abd radiating to back and upper abd), nausea/vomiting, and chills. No fever. No diarrhea. She has nausea medication to use PRN. She has not tried levsin. Feels like this may be similar to prior episodes of diverticulitis. Recommended eval in ER w/ CT imaging for pain to definitively diagnosis or rule out diverticulitis. She feels pain is slightly better than it was this morning so prefers to monitor at home today. Reviewed Er return precautions. All questions answered.

## 2019-08-07 NOTE — Telephone Encounter (Signed)
Myrene Bougher called about his wife Lindsay Palmer. Yesterday at 4 pm the patient started to have real bad pain in her stomach that will involve under her breast then into her back. The pain is consistent. She has N&V and did throw up at  8 am this morning.   All she had to eat last night was a [place] Muffin and has had nothing today to eat.  She has a loose stool last night. She is a febrile. Her husband also he pushed under her navel as Dr.Rehman had ask him to do in the past .it was firm and not soft as it should be.  He ask that we call them with recommendations. [phone].

## 2019-08-20 ENCOUNTER — Other Ambulatory Visit: Payer: Self-pay

## 2019-08-20 ENCOUNTER — Ambulatory Visit (INDEPENDENT_AMBULATORY_CARE_PROVIDER_SITE_OTHER): Payer: Medicare Other | Admitting: Psychiatry

## 2019-08-20 ENCOUNTER — Encounter (HOSPITAL_COMMUNITY): Payer: Self-pay | Admitting: Psychiatry

## 2019-08-20 DIAGNOSIS — F3130 Bipolar disorder, current episode depressed, mild or moderate severity, unspecified: Secondary | ICD-10-CM | POA: Diagnosis not present

## 2019-08-20 MED ORDER — ZOLPIDEM TARTRATE 10 MG PO TABS: 10.0000 mg | ORAL_TABLET | Freq: Every day | ORAL | 2 refills | Status: DC

## 2019-08-20 MED ORDER — ARIPIPRAZOLE 10 MG PO TABS: 10.0000 mg | ORAL_TABLET | Freq: Every day | ORAL | 2 refills | Status: DC

## 2019-08-20 MED ORDER — LAMOTRIGINE 200 MG PO TABS: 200.0000 mg | ORAL_TABLET | Freq: Every day | ORAL | 2 refills | Status: DC

## 2019-08-20 NOTE — Progress Notes (Signed)
Virtual Visit via Telephone Note  I connected with Lindsay Palmer on 08/20/19 at 10:00 AM EDT by telephone and verified that I am speaking with the correct person using two identifiers.   I discussed the limitations, risks, security and privacy concerns of performing an evaluation and management service by telephone and the availability of in person appointments. I also discussed with the patient that there may be a patient responsible charge related to this service. The patient expressed understanding and agreed to proceed.    I discussed the assessment and treatment plan with the patient. The patient was provided an opportunity to ask questions and all were answered. The patient agreed with the plan and demonstrated an understanding of the instructions.   The patient was advised to call back or seek an in-person evaluation if the symptoms worsen or if the condition fails to improve as anticipated.  I provided 15 minutes of non-face-to-face time during this encounter. Location: Provider office, patient home  Diannia Ruder, MD  [facility] MD/PA/NP OP Progress Note  08/20/2019 10:20 AM Lindsay Palmer  MRN:  [id number]  Chief Complaint:  Chief Complaint    Depression; Anxiety; Manic Behavior; Follow-up     HPI: This patient is a 79 year old married white female who lives with her husband in [place].  She is retired from [employer]  She has 1 son and 1 daughter and 3 grandchildren.  The patient returns for follow-up for treatment of bipolar disorder.  She was last seen 6 months ago.  She states that she continues to be stable.  She denies serious depression or thoughts of self-harm or suicide.  She denies manic symptoms such as racing thoughts irritability anger or difficulty focusing.  She is sleeping well most of the time with the Ambien.  She denies any auditory or visual loose Nations.  She still has chronic back pain but otherwise remains in good health. Visit Diagnosis:     ICD-10-CM   1. Bipolar I disorder, most recent episode depressed (HCC)  F31.30     Past Psychiatric History: Long-term outpatient treatment for bipolar disorder  Past Medical History:  Past Medical History:  Diagnosis Date  . Bipolar 2 disorder (HCC)   . Chronic insomnia   . Diverticulosis of colon   . Elevated LFTs   . Fatty liver disease, nonalcoholic   . GERD (gastroesophageal reflux disease)   . H/O first degree atrioventricular block   . Hypercholesteremia   . Hypertension   . Major depressive disorder   . Migraine   . Osteopenia   . Pancreatitis   . Sleep apnea    Stop Bang score of 4. Pt said she had sleep apnea at one time, but she has had another sleep study since then and they said she does not have it anymore.  . Vitamin D deficiency     Past Surgical History:  Procedure Laterality Date  . BILIARY STENT PLACEMENT N/A 07/18/2013   Procedure: BILIARY STENT PLACEMENT;  Surgeon: Malissa Hippo, MD;  Location: [facility];  Service: Endoscopy;  Laterality: N/A;  . Cataract Surgery Bilateral 08-2012  . CHOLECYSTECTOMY    . COLONOSCOPY  2003   Diverticulitis h/o polyps -due next 2015 -gastroscopy 2003  . ERCP    . ERCP N/A 07/18/2013   Procedure: ENDOSCOPIC RETROGRADE CHOLANGIOPANCREATOGRAPHY (ERCP) COMMON BILE DUCT BRUSHING;  Surgeon: Malissa Hippo, MD;  Location: [facility];  Service: Endoscopy;  Laterality: N/A;  . ERCP N/A 11/07/2013   Procedure: ENDOSCOPIC RETROGRADE  CHOLANGIOPANCREATOGRAPHY (ERCP);  Surgeon: Malissa Hippo, MD;  Location: [facility];  Service: Endoscopy;  Laterality: N/A;  . ESOPHAGEAL DILATION N/A 08/23/2016   Procedure: ESOPHAGEAL DILATION;  Surgeon: Malissa Hippo, MD;  Location: [facility];  Service: Endoscopy;  Laterality: N/A;  . ESOPHAGOGASTRODUODENOSCOPY N/A 08/23/2016   Procedure: ESOPHAGOGASTRODUODENOSCOPY (EGD);  Surgeon: Malissa Hippo, MD;  Location: [facility];  Service: Endoscopy;  Laterality: N/A;  245-moved up to 100 per [name]  . EYE  SURGERY Bilateral    cataract removal  . NECK SURGERY     Stenosis C6-C7  . SIGMOIDOSCOPY  2007   no polyps  . SPHINCTEROTOMY N/A 07/18/2013   Procedure: SPHINCTEROTOMY;  Surgeon: Malissa Hippo, MD;  Location: [facility];  Service: Endoscopy;  Laterality: N/A;  . SPHINCTEROTOMY  11/07/2013   Procedure: EXTENSION OF SPHINCTEROTOMY;  Surgeon: Malissa Hippo, MD;  Location: [facility];  Service: Endoscopy;;  . STENT REMOVAL N/A 11/07/2013   Procedure: STENT REMOVAL;  Surgeon: Malissa Hippo, MD;  Location: [facility];  Service: Endoscopy;  Laterality: N/A;    Family Psychiatric History: see below  Family History:  Family History  Problem Relation Age of Onset  . Diabetes Mother   . Coronary artery disease Father   . Gout Father   . Breast cancer Sister   . Pancreatic cancer Brother   . Alcohol abuse Brother   . Healthy Brother   . Lung cancer Sister   . Healthy Sister   . Depression Sister   . Hyperlipidemia Daughter   . Hyperlipidemia Son   . Bipolar disorder Son   . Healthy Sister   . Healthy Sister   . Seizures Other     Social History:  Social History   Socioeconomic History  . Marital status: Married    Spouse name: Not on file  . Number of children: Not on file  . Years of education: Not on file  . Highest education level: Not on file  Occupational History  . Not on file  Tobacco Use  . Smoking status: Never Smoker  . Smokeless tobacco: Never Used  Vaping Use  . Vaping Use: Never used  Substance and Sexual Activity  . Alcohol use: No    Alcohol/week: 0.0 standard drinks  . Drug use: No  . Sexual activity: Not on file  Other Topics Concern  . Not on file  Social History Narrative  . Not on file   Social Determinants of Health   Financial Resource Strain:   . Difficulty of Paying Living Expenses:   Food Insecurity:   . Worried About Programme researcher, broadcasting/film/video in the Last Year:   . Barista in the Last Year:   Transportation Needs:   . Automotive engineer (Medical):   Marland Kitchen Lack of Transportation (Non-Medical):   Physical Activity:   . Days of Exercise per Week:   . Minutes of Exercise per Session:   Stress:   . Feeling of Stress :   Social Connections:   . Frequency of Communication with Friends and Family:   . Frequency of Social Gatherings with Friends and Family:   . Attends Religious Services:   . Active Member of Clubs or Organizations:   . Attends Banker Meetings:   Marland Kitchen Marital Status:     Allergies:  Allergies  Allergen Reactions  . Codeine Nausea And Vomiting  . Ciprofloxacin     Nausea and Vomiting  . Flagyl [Metronidazole]  Nausea and vomiting  . Oxycodone Nausea And Vomiting    Metabolic Disorder Labs: No results found for: HGBA1C, MPG No results found for: PROLACTIN Lab Results  Component Value Date   TRIG 113 07/28/2013   Lab Results  Component Value Date   TSH 1.213 09/14/2014    Therapeutic Level Labs: No results found for: LITHIUM No results found for: VALPROATE No components found for:  CBMZ  Current Medications: Current Outpatient Medications  Medication Sig Dispense Refill  . ARIPiprazole (ABILIFY) 10 MG tablet Take 1 tablet (10 mg total) by mouth at bedtime. 90 tablet 2  . aspirin EC 81 MG tablet Take 81 mg by mouth daily.    . Cholecalciferol (VITAMIN D) 400 UNITS capsule Take 400 Units by mouth daily.    . Fish Oil-Cholecalciferol (FISH OIL + D3 PO) Take 1 capsule by mouth 2 (two) times daily.     . furosemide (LASIX) 40 MG tablet TAKE 1 TABLET BY MOUTH EVERY DAY AS NEEDED 90 tablet 0  . hyoscyamine (LEVSIN SL) 0.125 MG SL tablet PLACE 1-2 TABLETS UNDER THE TONGUE 2 (TWO) TIMES DAILY AS NEEDED. 360 tablet 0  . lamoTRIgine (LAMICTAL) 200 MG tablet Take 1 tablet (200 mg total) by mouth daily. 90 tablet 2  . losartan (COZAAR) 100 MG tablet 100 mg daily.     . metoprolol tartrate (LOPRESSOR) 25 MG tablet Take 12.5 mg by mouth 2 (two) times daily.     Marland Kitchen NIFEdipine  (ADALAT CC) 90 MG 24 hr tablet Take 90 mg by mouth daily.     Marland Kitchen omeprazole (PRILOSEC) 20 MG capsule TAKE 1 CAPSULE BY MOUTH TWICE A DAY BEFORE A MEAL 180 capsule 3  . ondansetron (ZOFRAN) 4 MG tablet Take 1 tablet (4 mg total) by mouth every 8 (eight) hours as needed for nausea or vomiting. 30 tablet 2  . polyethylene glycol powder (GLYCOLAX/MIRALAX) powder Take 17 grams daily prn for Constipation. 1581 g 3  . potassium chloride (MICRO-K) 10 MEQ CR capsule Take 2 capsules (20 mEq total) by mouth daily as needed (Take potassium on days when you take torsemide.). 180 capsule 0  . simvastatin (ZOCOR) 20 MG tablet Take 20 mg by mouth every evening.     . vitamin B-12 (CYANOCOBALAMIN) 1000 MCG tablet Take 1,000 mcg by mouth daily.    Marland Kitchen zolpidem (AMBIEN) 10 MG tablet Take 1 tablet (10 mg total) by mouth at bedtime. 90 tablet 2   No current facility-administered medications for this visit.     Musculoskeletal: Strength & Muscle Tone: within normal limits Gait & Station: normal Patient leans: N/A  Psychiatric Specialty Exam: Review of Systems  Musculoskeletal: Positive for back pain.  All other systems reviewed and are negative.   There were no vitals taken for this visit.There is no height or weight on file to calculate BMI.  General Appearance: NA  Eye Contact:  NA  Speech:  Clear and Coherent  Volume:  Normal  Mood:  Euthymic  Affect:  NA  Thought Process:  Goal Directed  Orientation:  Full (Time, Place, and Person)  Thought Content: WDL   Suicidal Thoughts:  No  Homicidal Thoughts:  No  Memory:  Immediate;   Good Recent;   Good Remote;   Good  Judgement:  Good  Insight:  Good  Psychomotor Activity:  Normal  Concentration:  Concentration: Good and Attention Span: Good  Recall:  Good  Fund of Knowledge: Good  Language: Good  Akathisia:  No  Handed:  Right  AIMS (if indicated): not done  Assets:  Communication Skills Desire for Improvement Physical  Health Resilience Social Support Talents/Skills  ADL's:  Intact  Cognition: WNL  Sleep:  Good   Screenings:   Assessment and Plan: This patient is a 79 year old female with a history of bipolar disorder.  She continues to be stable on her current regimen.  She will continue Abilify 10 mg at bedtime for mood stabilization, Lamictal 200 mg daily for mood stabilization and Ambien 10 mg at bedtime for sleep.  She will return to see me in 6 months   Diannia Rudereborah Grafton Warzecha, MD 08/20/2019, 10:20 AM

## 2019-09-15 ENCOUNTER — Telehealth (INDEPENDENT_AMBULATORY_CARE_PROVIDER_SITE_OTHER): Payer: Self-pay | Admitting: Internal Medicine

## 2019-09-15 NOTE — Telephone Encounter (Signed)
Patient symptom diary from end of May and June reviewed. He has nausea in the morning in the afternoon as well as evening.  She can have nausea daily for few days and then she can have 2 or 3 days without nausea.  She is having chills but did not check her temperature. States she had generalized abdominal pain today.  She did not take Levsin. She did not experience vomiting. Patient advised to try Levsin before each meal and see if it would help decrease severity and frequency of abdominal pain. Advised to check temperature when she has chills and if it is above 101 she should report to the emergency room otherwise call office. Patient will call if she has another episode of severe abdominal pain in which case we will plan to see her earlier than 10/28/2019.

## 2019-10-28 ENCOUNTER — Ambulatory Visit (INDEPENDENT_AMBULATORY_CARE_PROVIDER_SITE_OTHER): Payer: Medicare Other | Admitting: Internal Medicine

## 2019-12-01 DIAGNOSIS — I509 Heart failure, unspecified: Secondary | ICD-10-CM

## 2019-12-01 HISTORY — DX: Heart failure, unspecified: I50.9

## 2019-12-02 ENCOUNTER — Ambulatory Visit (INDEPENDENT_AMBULATORY_CARE_PROVIDER_SITE_OTHER): Payer: Medicare Other | Admitting: Internal Medicine

## 2019-12-29 ENCOUNTER — Telehealth (INDEPENDENT_AMBULATORY_CARE_PROVIDER_SITE_OTHER): Payer: Self-pay | Admitting: Internal Medicine

## 2019-12-29 MED ORDER — ONDANSETRON HCL 4 MG PO TABS: 4.0000 mg | ORAL_TABLET | Freq: Three times a day (TID) | ORAL | 1 refills | Status: DC | PRN

## 2019-12-29 NOTE — Telephone Encounter (Signed)
Talk with patient's husband Mellody Dance last evening. Patient with daily nausea and poor appetite.  No vomiting. She has been hospitalized 3 times over the last several weeks for pneumonia and CHF and now on nasal O2.  Prescription for ondansetron 4 mg 3 times daily as needed 60 with 1 refill sent to patient's pharmacy. Will arrange for office visit later this week.

## 2020-01-01 ENCOUNTER — Other Ambulatory Visit: Payer: Self-pay

## 2020-01-01 ENCOUNTER — Other Ambulatory Visit (INDEPENDENT_AMBULATORY_CARE_PROVIDER_SITE_OTHER): Payer: Self-pay

## 2020-01-01 ENCOUNTER — Encounter (INDEPENDENT_AMBULATORY_CARE_PROVIDER_SITE_OTHER): Payer: Self-pay

## 2020-01-01 ENCOUNTER — Ambulatory Visit (INDEPENDENT_AMBULATORY_CARE_PROVIDER_SITE_OTHER): Payer: Medicare Other | Admitting: Internal Medicine

## 2020-01-01 ENCOUNTER — Encounter (INDEPENDENT_AMBULATORY_CARE_PROVIDER_SITE_OTHER): Payer: Self-pay | Admitting: Internal Medicine

## 2020-01-01 VITALS — BP 140/60 | HR 34 | Temp 98.7°F | Ht 64.0 in | Wt 189.5 lb

## 2020-01-01 DIAGNOSIS — R110 Nausea: Secondary | ICD-10-CM

## 2020-01-01 DIAGNOSIS — R1033 Periumbilical pain: Secondary | ICD-10-CM | POA: Diagnosis not present

## 2020-01-01 DIAGNOSIS — K219 Gastro-esophageal reflux disease without esophagitis: Secondary | ICD-10-CM

## 2020-01-01 MED ORDER — ESOMEPRAZOLE MAGNESIUM 40 MG PO CPDR: 40.0000 mg | DELAYED_RELEASE_CAPSULE | Freq: Every day | ORAL | 5 refills | Status: DC

## 2020-01-01 NOTE — Patient Instructions (Addendum)
Esophagogastroduodenoscopy to be scheduled. Take Nexium/omeprazole 30 minutes before breakfast daily. Take polyethylene glycol half a scoop or less daily or every other day.

## 2020-01-01 NOTE — Progress Notes (Signed)
Presenting complaint;  Intractable nausea and abdominal pain.  Database and subjective:  Lindsay Palmer is 79 year old Caucasian female who is here for scheduled visit accompanied by her husband Lindsay Palmer.  She has an appointment for February 2022.  He called few days ago and requested earlier appointment.  She was last seen on 06/17/2019 and advised to use hyoscyamine sublingual for lower abdominal pain and polyethylene glycol daily or every other day.  He was also having lower extremity edema and she was advised to follow-up with her cardiologist.  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  She has a history of complicated sigmoid diverticulitis for which she underwent sigmoid colon resection with ileostomy in March 2019.  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  Ileostomy was reversed in August 2019. She also has fatty liver chronic constipation history of nausea.  History is provided by Lindsay Palmer and her husband. Lindsay Palmer states she has been doing very poorly over the last couple of months.  She was admitted to hospital in [place] in October 2021 for bilateral pneumonia.  She was treated with antibiotics.  She returned for second admission for pneumonia and CHF and most recently she was admitted on 12/09/2019 and discharged a day later.  Discharge diagnosis was acute on chronic respiratory failure secondary to congestive heart failure possibly exacerbated by elevated blood pressure, chronic kidney disease elevated BP and prediabetes and protein calorie malnutrition.  Lindsay Palmer now is on nasal O2.  Lindsay Palmer's husband states that she has dilated heart but he does not know what her ejection fraction was.  According to her husband she did have cardiac cath and she did not have any blockages.  Lindsay Palmer complains of daily nausea.  He used to have it every now and then but  in the last 2 to 3 months it has been virtually every day.  Nausea gets worse after every meal and may last for 2 to 3 hours.  Lindsay Palmer states is very hard for her to throw up.  Last time she threw up was few months ago.  She has been using ondansetron but is not helping.  She denies heartburn dysphagia chronic cough or sore throat.  She complains of mid and lower abdominal pain.  Pain is intermittent.  It does not occur every day.  She has used Levsin in the past which seem to help.  She says she use Levophed within the last 4 to 6 weeks.  Her bowels move every other day.  She takes polyethylene glycol no more than once a week.  She denies melena or rectal bleeding.  She has not lost any weight since her last visit of Jun 17, 2019 when she weighed 189 pounds.  According to her husband her weight was 194 pounds when she was admitted for CHF for 3 weeks ago. Lindsay Palmer feels weak.  She is not trying to walk some inside the house.     Current Medications: Outpatient Encounter Medications as of 01/01/2020  Medication Sig  . ARIPiprazole (ABILIFY) 10 MG tablet Take 1 tablet (10 mg total) by mouth at bedtime.  . carvedilol (COREG) 25 MG tablet Take 25 mg by mouth 2 (two) times daily with a meal.   . Cholecalciferol (VITAMIN D) 400 UNITS capsule Take 400 Units by mouth daily.  . Fish Oil-Cholecalciferol (FISH OIL + D3 PO) Take 1 capsule by mouth 2 (two) times  daily.   . hyoscyamine (LEVSIN SL) 0.125 MG SL tablet PLACE 1-2 TABLETS UNDER THE TONGUE 2 (TWO) TIMES DAILY AS NEEDED.  Marland Kitchen lamoTRIgine (LAMICTAL) 200 MG tablet Take 1 tablet (200 mg total) by mouth daily.  Marland Kitchen losartan (COZAAR) 100 MG tablet 100 mg daily.   . meloxicam (MOBIC) 15 MG tablet Take 15 mg by mouth daily.   Marland Kitchen omeprazole (PRILOSEC) 20 MG capsule TAKE 1 CAPSULE BY MOUTH TWICE A DAY BEFORE A MEAL  . ondansetron (ZOFRAN) 4 MG tablet Take 1 tablet (4 mg total) by mouth every 8 (eight) hours as needed for nausea or vomiting.  . polyethylene glycol  powder (GLYCOLAX/MIRALAX) powder Take 17 grams daily prn for Constipation.  . potassium chloride (MICRO-K) 10 MEQ CR capsule Take 2 capsules (20 mEq total) by mouth daily as needed (Take potassium on days when you take torsemide.).  Marland Kitchen simvastatin (ZOCOR) 20 MG tablet Take 20 mg by mouth every evening.   . torsemide (DEMADEX) 20 MG tablet Take 20 mg by mouth daily.   . vitamin B-12 (CYANOCOBALAMIN) 1000 MCG tablet Take 1,000 mcg by mouth daily.  Marland Kitchen zolpidem (AMBIEN) 10 MG tablet Take 1 tablet (10 mg total) by mouth at bedtime.  Marland Kitchen aspirin EC 81 MG tablet Take 81 mg by mouth daily. (Lindsay Palmer not taking: Reported on 01/01/2020)  . furosemide (LASIX) 40 MG tablet TAKE 1 TABLET BY MOUTH EVERY DAY AS NEEDED (Lindsay Palmer not taking: Reported on 01/01/2020)  . metoprolol tartrate (LOPRESSOR) 25 MG tablet Take 12.5 mg by mouth 2 (two) times daily.  (Lindsay Palmer not taking: Reported on 01/01/2020)  . NIFEdipine (ADALAT CC) 90 MG 24 hr tablet Take 90 mg by mouth daily.  (Lindsay Palmer not taking: Reported on 01/01/2020)   No facility-administered encounter medications on file as of 01/01/2020.     Objective: Blood pressure 140/60, pulse 56, temperature 98.7 F (37.1 C), temperature source Oral, height 5\' 4"  (1.626 m), weight 189 lb 8 oz (86 kg). Lindsay Palmer is alert and in no acute distress. She is on nasal O2. She is wearing a mask. She has a dressing covering temple where she had skin cancer removed.  Examination of this area reveals dense scar but no drainage. Conjunctiva is pink. Sclera is nonicteric Oropharyngeal mucosa is normal. No neck masses or thyromegaly noted. Cardiac exam with iiregular rhythm normal S1 and S2. No murmur or gallop noted. Lungs are clear to auscultation. Abdomen is full.  She has long midline scar.  Umbilical hernia noted.  Hernia no more than 3 cm in diameter and reducible.  Bowel sounds are normal.  No bruit noted.  On palpation abdomen is soft and nontender without organomegaly or masses.   Percussion note is normal. She does not have clubbing.  She has trace edema around ankles.  Labs/studies Results:  CBC Latest Ref Rng & Units 04/28/2019 01/25/2017 01/11/2017  WBC 3.8 - 10.8 Thousand/uL 8.1 6.4 11.2(H)  Hemoglobin 11.7 - 15.5 g/dL 01/13/2017 34.1 96.2)  Hematocrit 35 - 45 % 36.8 35.1 31.6(L)  Platelets 140 - 400 Thousand/uL 272 359 286   Lab data from 12/19/2019.  Copy provided by Lindsay Palmer's husband. WBC 7.62 H&H 11.5 and 37.1. Platelet count 242K. Differential reveals 61 neutrophils, 25.5% lymphocytes, 10.5% monocytes.  Eosinophils 2.2% and basophils 0.4%.  BUN 27, creatinine 1.1 Serum sodium 141, serum potassium 4.4, chloride 109, CO2 24 Glucose 83. Serum calcium 8.8 Bilirubin is 0.3, AP 210, AST 41, ALT 58 total protein 6.9 and albumin 3.3.  Abdominal CT  from 05/08/2019 reviewed. Pneumobilia secondary to priors biliary sphincterotomy. Umbilical hernia containing loop of small bowel. 2.4 x 1.5 cm ovoid soft tissue lesion in the mesentery with peripheral calcification felt to be residual of pseudocyst. It has decreased since prior study of 11/16/2017.  Gastric emptying study on 02/15/2015 85% of the tracer had emptied at 4 hours implying very mild gastroparesis.   Assessment:  #1.  Acute on chronic nausea.  Nausea is experience every day affecting quality of her life.  Nausea is predominantly postprandial raising possibility of worsening gastroparesis but she could also have peptic ulcer disease given that she is been taking meloxicam chronically and unable to come off it because of back pain.  She also has a history of bile duct stricture.  CT in April this year did not reveal biliary dilation or recurrence of her stricture.  Therefore I do not believe that this is an issue.  However APN transaminases are mildly elevated and therefore will have to keep this in mind as well. She will benefit from diagnostic esophagogastroduodenoscopy prior to considering repeat gastric  emptying study.  #2.  Chronic GERD.  Heartburn is well controlled with double dose PPI.  It may be a good idea to change to a different preparation to see if it helps with her symptoms.  #3.  Mid and lower abdominal pain.  I suspect she has underlying IBS.  She is not using polyethylene glycol as recommended.  I feel she may do better if her bowels move daily rather than every other day.  #4.  Mild anemia.  Review of her hospital records revealed her hemoglobin had dropped to 8.1 during hospitalization in November 2021 and now it is up to near normal.  Anemia would appear to be due to acute illness.  #5.  Abnormal LFTs.  Lab studies from 2 weeks ago revealed mildly elevated AST ALT and alkaline phosphatase.  All of these studies were normal back in March 2021.  The differential diagnosis includes abnormality secondary to meds or fatty liver.  Doubt recurrence of biliary stricture.  If LFTs remain abnormal would consider MRCP   Plan:  Discontinue omeprazole. Begin esomeprazole 40 mg p.o. every morning. Lindsay Palmer advised to take ondansetron 8 mg 30 minutes before breakfast daily and second or third dose on as-needed basis. Lindsay Palmer advised to take polyethylene glycol 8.5 g daily or every other day. Diagnostic esophagogastroduodenoscopy under monitored anesthesia care near future. Will check LFTs at the time of endoscopy. Further recommendations to follow. Lindsay Palmer has office visit on 03/02/2018.

## 2020-01-02 ENCOUNTER — Other Ambulatory Visit (INDEPENDENT_AMBULATORY_CARE_PROVIDER_SITE_OTHER): Payer: Self-pay

## 2020-01-19 NOTE — Patient Instructions (Signed)
20    Your procedure is scheduled on: 01/21/20  Report to [facility] at  11:45   AM.  Call this number if you have problems the morning of surgery: [phone]   Remember:   Follow instructions on letter from office regarding when to stop eating and drinking        No Smoking the day of procedure      Take these medicines the morning of surgery with A SIP OF WATER: Carvedilol, Nexium, and Lamotrigine   (zofran, hydrocodone                                                                                                                                 and/or levsin if needed)   Do not wear jewelry, make-up or nail polish.  Do not wear lotions, powders, or perfumes. You may wear deodorant.                Do not bring valuables to the hospital.  Contacts, dentures or bridgework may not be worn into surgery.  Leave suitcase in the car. After surgery it may be brought to your room.  For patients admitted to the hospital, checkout time is 11:00 AM the day of discharge.   Patients discharged the day of surgery will not be allowed to drive home. Upper Endoscopy, Adult Upper endoscopy is a procedure to look inside the upper GI (gastrointestinal) tract. The upper GI tract is made up of:  The part of the body that moves food from your mouth to your stomach (esophagus).  The stomach.  The first part of your small intestine (duodenum). This procedure is also called esophagogastroduodenoscopy (EGD) or gastroscopy. In this procedure, your health care provider passes a thin, flexible tube (endoscope) through your mouth and down your esophagus into your stomach. A small camera is attached to the end of the tube. Images from the camera appear on a monitor in the exam room. During this procedure, your health care provider may also remove a small piece of tissue to be sent to a lab and examined under a microscope (biopsy). Your health care provider may do an upper endoscopy to diagnose cancers of the upper GI  tract. You may also have this procedure to find the cause of other conditions, such as:  Stomach pain.  Heartburn.  Pain or problems when swallowing.  Nausea and vomiting.  Stomach bleeding.  Stomach ulcers. Tell a health care provider about:  Any allergies you have.  All medicines you are taking, including vitamins, herbs, eye drops, creams, and over-the-counter medicines.  Any problems you or family members have had with anesthetic medicines.  Any blood disorders you have.  Any surgeries you have had.  Any medical conditions you have.  Whether you are pregnant or may be pregnant. What are the risks? Generally, this is a safe procedure. However, problems may occur, including:  Infection.  Bleeding.  Allergic reactions to medicines.  A tear or hole (perforation) in the esophagus, stomach, or duodenum. What happens before the procedure? Staying hydrated Follow instructions from your health care provider about hydration, which may include:  Up to 2 hours before the procedure - you may continue to drink clear liquids, such as water, clear fruit juice, black coffee, and plain tea.  Eating and drinking restrictions Follow instructions from your health care provider about eating and drinking, which may include:  8 hours before the procedure - stop eating heavy meals or foods, such as meat, fried foods, or fatty foods.  6 hours before the procedure - stop eating light meals or foods, such as toast or cereal.  6 hours before the procedure - stop drinking milk or drinks that contain milk.  2 hours before the procedure - stop drinking clear liquids. Medicines Ask your health care provider about:  Changing or stopping your regular medicines. This is especially important if you are taking diabetes medicines or blood thinners.  Taking medicines such as aspirin and ibuprofen. These medicines can thin your blood. Do not take these medicines unless your health care provider  tells you to take them.  Taking over-the-counter medicines, vitamins, herbs, and supplements. General instructions  Plan to have someone take you home from the hospital or clinic.  If you will be going home right after the procedure, plan to have someone with you for 24 hours.  Ask your health care provider what steps will be taken to help prevent infection. What happens during the procedure?  1. An IV will be inserted into one of your veins. 2. You may be given one or more of the following: ? A medicine to help you relax (sedative). ? A medicine to numb the throat (local anesthetic). 3. You will lie on your left side on an exam table. 4. Your health care provider will pass the endoscope through your mouth and down your esophagus. 5. Your health care provider will use the scope to check the inside of your esophagus, stomach, and duodenum. Biopsies may be taken. 6. The endoscope will be removed. The procedure may vary among health care providers and hospitals. What happens after the procedure?  Your blood pressure, heart rate, breathing rate, and blood oxygen level will be monitored until you leave the hospital or clinic.  Do not drive for 24 hours if you were given a sedative during your procedure.  When your throat is no longer numb, you may be given some fluids to drink.  It is up to you to get the results of your procedure. Ask your health care provider, or the department that is doing the procedure, when your results will be ready. Summary  Upper endoscopy is a procedure to look inside the upper GI tract.  During the procedure, an IV will be inserted into one of your veins. You may be given a medicine to help you relax.  A medicine will be used to numb your throat.  The endoscope will be passed through your mouth and down your esophagus. This information is not intended to replace advice given to you by your health care provider. Make sure you discuss any questions you have  with your health care provider. Document Revised: 07/11/2017 Document Reviewed: 06/18/2017 Elsevier Patient Education  2020 [employer].  EndoscopyCare After  Please read the instructions outlined below and refer to this sheet in the next few weeks. These discharge instructions provide you with general information on caring for yourself after you leave the hospital. Your doctor may also give you specific instructions. While your treatment has been planned according to the most current medical practices available, unavoidable complications occasionally occur. If you have any problems or questions after discharge, please call your doctor. HOME CARE INSTRUCTIONS Activity  You may resume your regular activity but move at a slower pace for the next 24 hours.   Take frequent rest periods for the next 24 hours.   Walking will help expel (get rid of) the air and reduce the bloated feeling in your abdomen.   No driving for 24 hours (because of the anesthesia (medicine) used during the test).   You may shower.   Do not sign any important legal documents or operate any machinery for 24 hours (because of the anesthesia used during the test).  Nutrition  Drink plenty of fluids.   You may resume your normal diet.   Begin with a light meal and progress to your normal diet.   Avoid alcoholic beverages for 24 hours or as instructed by your caregiver.  Medications You may resume your normal medications unless your caregiver tells you otherwise. What you can expect today  You may experience abdominal discomfort such as a feeling of fullness or "gas" pains.   You may experience a sore throat for 2 to 3 days. This is normal. Gargling with salt water may help this.  Follow-up Your doctor will discuss the results of your test with you. SEEK IMMEDIATE MEDICAL CARE  IF:  You have excessive nausea (feeling sick to your stomach) and/or vomiting.   You have severe abdominal pain and distention (swelling).   You have trouble swallowing.   You have a temperature over 100 F (37.8 C).   You have rectal bleeding or vomiting of blood.  Document Released: 08/31/2003 Document Revised: 01/05/2011 Document Reviewed: 03/13/2007

## 2020-01-20 ENCOUNTER — Encounter (HOSPITAL_COMMUNITY): Payer: Self-pay

## 2020-01-20 ENCOUNTER — Encounter (HOSPITAL_COMMUNITY): Payer: Self-pay | Admitting: Anesthesiology

## 2020-01-20 ENCOUNTER — Other Ambulatory Visit (HOSPITAL_COMMUNITY)
Admission: RE | Admit: 2020-01-20 | Discharge: 2020-01-20 | Disposition: A | Discharge status: Home / Self Care | Payer: Medicare Other | Source: Ambulatory Visit | Attending: Internal Medicine | Admitting: Internal Medicine

## 2020-01-20 ENCOUNTER — Emergency Department (HOSPITAL_COMMUNITY): Payer: Medicare Other

## 2020-01-20 ENCOUNTER — Encounter (HOSPITAL_COMMUNITY)
Admission: RE | Admit: 2020-01-20 | Discharge: 2020-01-20 | Disposition: A | Discharge status: Home / Self Care | Payer: Medicare Other | Source: Ambulatory Visit | Attending: Internal Medicine | Admitting: Internal Medicine

## 2020-01-20 ENCOUNTER — Other Ambulatory Visit: Payer: Self-pay

## 2020-01-20 ENCOUNTER — Encounter (HOSPITAL_COMMUNITY): Payer: Self-pay | Admitting: Emergency Medicine

## 2020-01-20 ENCOUNTER — Emergency Department (HOSPITAL_COMMUNITY)
Admission: EM | Admit: 2020-01-20 | Discharge: 2020-01-20 | Disposition: A | Discharge status: Home / Self Care | Payer: Medicare Other | Attending: Emergency Medicine | Admitting: Emergency Medicine

## 2020-01-20 DIAGNOSIS — I110 Hypertensive heart disease with heart failure: Secondary | ICD-10-CM | POA: Insufficient documentation

## 2020-01-20 DIAGNOSIS — I509 Heart failure, unspecified: Secondary | ICD-10-CM | POA: Diagnosis not present

## 2020-01-20 DIAGNOSIS — Z20822 Contact with and (suspected) exposure to covid-19: Secondary | ICD-10-CM | POA: Diagnosis not present

## 2020-01-20 DIAGNOSIS — J9611 Chronic respiratory failure with hypoxia: Secondary | ICD-10-CM | POA: Insufficient documentation

## 2020-01-20 DIAGNOSIS — Z79899 Other long term (current) drug therapy: Secondary | ICD-10-CM | POA: Diagnosis not present

## 2020-01-20 DIAGNOSIS — R059 Cough, unspecified: Secondary | ICD-10-CM | POA: Diagnosis present

## 2020-01-20 HISTORY — DX: Pneumonia, unspecified organism: J18.9

## 2020-01-20 HISTORY — DX: Dyspnea, unspecified: R06.00

## 2020-01-20 LAB — CBC
HCT: 29 % — ABNORMAL LOW (ref 36.0–46.0)
Hemoglobin: 8.5 g/dL — ABNORMAL LOW (ref 12.0–15.0)
MCH: 26 pg (ref 26.0–34.0)
MCHC: 29.3 g/dL — ABNORMAL LOW (ref 30.0–36.0)
MCV: 88.7 fL (ref 80.0–100.0)
Platelets: 248 10*3/uL (ref 150–400)
RBC: 3.27 MIL/uL — ABNORMAL LOW (ref 3.87–5.11)
RDW: 15.5 % (ref 11.5–15.5)
WBC: 5.3 10*3/uL (ref 4.0–10.5)
nRBC: 0 % (ref 0.0–0.2)

## 2020-01-20 LAB — COMPREHENSIVE METABOLIC PANEL
ALT: 12 U/L (ref 0–44)
AST: 18 U/L (ref 15–41)
Albumin: 3.4 g/dL — ABNORMAL LOW (ref 3.5–5.0)
Alkaline Phosphatase: 75 U/L (ref 38–126)
Anion gap: 13 (ref 5–15)
BUN: 15 mg/dL (ref 8–23)
CO2: 26 mmol/L (ref 22–32)
Calcium: 9 mg/dL (ref 8.9–10.3)
Chloride: 103 mmol/L (ref 98–111)
Creatinine, Ser: 1.17 mg/dL — ABNORMAL HIGH (ref 0.44–1.00)
GFR, Estimated: 48 mL/min — ABNORMAL LOW (ref >60)
Glucose, Bld: 104 mg/dL — ABNORMAL HIGH (ref 70–99)
Potassium: 4.1 mmol/L (ref 3.5–5.1)
Sodium: 142 mmol/L (ref 135–145)
Total Bilirubin: 0.8 mg/dL (ref 0.3–1.2)
Total Protein: 7.1 g/dL (ref 6.5–8.1)

## 2020-01-20 LAB — BRAIN NATRIURETIC PEPTIDE: B Natriuretic Peptide: 32 pg/mL (ref 0.0–100.0)

## 2020-01-20 LAB — RESP PANEL BY RT-PCR (FLU A&B, COVID) ARPGX2
Influenza A by PCR: NEGATIVE
Influenza B by PCR: NEGATIVE
SARS Coronavirus 2 by RT PCR: NEGATIVE

## 2020-01-20 LAB — TROPONIN I (HIGH SENSITIVITY): Troponin I (High Sensitivity): 12 ng/L (ref <18)

## 2020-01-20 NOTE — ED Notes (Signed)
Attempted x 2 for IV access without success.  

## 2020-01-20 NOTE — ED Provider Notes (Signed)
[facility] EMERGENCY DEPARTMENT Provider Note  CSN: [id number] Arrival date & time: 01/20/20 1026    History Chief Complaint  Patient presents with  . Shortness of Breath    HPI  Lindsay Palmer is a 79 y.o. female with history of CHF and pneumonia has been admitted in [place] several times recently and now has home oxygen which she wears at night and occasionally during the day. She was here today for pre-op testing for planned EGD tomorrow but was not wearing her oxygen after getting out of the car and was found to be hypoxic and hypertensive. She was sent to the ED for evaluation. She reports some continued dry cough, no CP, no fever, no leg swelling.    Past Medical History:  Diagnosis Date  . Bipolar 2 disorder (HCC)   . Chronic insomnia   . Congestive heart failure (CHF) (HCC) 12/2019  . Diverticulosis of colon   . Dyspnea   . Elevated LFTs   . Fatty liver disease, nonalcoholic   . GERD (gastroesophageal reflux disease)   . H/O first degree atrioventricular block   . Hypercholesteremia   . Hypertension   . Major depressive disorder   . Migraine   . Osteopenia   . Pancreatitis   . Pneumonia   . Sleep apnea    Stop Bang score of 4. Pt said she had sleep apnea at one time, but she has had another sleep study since then and they said she does not have it anymore.  . Vitamin D deficiency     Past Surgical History:  Procedure Laterality Date  . BILIARY STENT PLACEMENT N/A 07/18/2013   Procedure: BILIARY STENT PLACEMENT;  Surgeon: Malissa Hippo, MD;  Location: [facility];  Service: Endoscopy;  Laterality: N/A;  . Cataract Surgery Bilateral 08-2012  . CHOLECYSTECTOMY    . COLONOSCOPY  2003   Diverticulitis h/o polyps -due next 2015 -gastroscopy 2003  . ERCP    . ERCP N/A 07/18/2013   Procedure: ENDOSCOPIC RETROGRADE CHOLANGIOPANCREATOGRAPHY (ERCP) COMMON BILE DUCT BRUSHING;  Surgeon: Malissa Hippo, MD;  Location: [facility];  Service: Endoscopy;  Laterality: N/A;  . ERCP  N/A 11/07/2013   Procedure: ENDOSCOPIC RETROGRADE CHOLANGIOPANCREATOGRAPHY (ERCP);  Surgeon: Malissa Hippo, MD;  Location: [facility];  Service: Endoscopy;  Laterality: N/A;  . ESOPHAGEAL DILATION N/A 08/23/2016   Procedure: ESOPHAGEAL DILATION;  Surgeon: Malissa Hippo, MD;  Location: [facility];  Service: Endoscopy;  Laterality: N/A;  . ESOPHAGOGASTRODUODENOSCOPY N/A 08/23/2016   Procedure: ESOPHAGOGASTRODUODENOSCOPY (EGD);  Surgeon: Malissa Hippo, MD;  Location: [facility];  Service: Endoscopy;  Laterality: N/A;  245-moved up to 100 per [name]  . EYE SURGERY Bilateral    cataract removal  . NECK SURGERY     Stenosis C6-C7  . PARTIAL COLECTOMY  2018  . SIGMOIDOSCOPY  2007   no polyps  . SPHINCTEROTOMY N/A 07/18/2013   Procedure: SPHINCTEROTOMY;  Surgeon: Malissa Hippo, MD;  Location: [facility];  Service: Endoscopy;  Laterality: N/A;  . SPHINCTEROTOMY  11/07/2013   Procedure: EXTENSION OF SPHINCTEROTOMY;  Surgeon: Malissa Hippo, MD;  Location: [facility];  Service: Endoscopy;;  . STENT REMOVAL N/A 11/07/2013   Procedure: STENT REMOVAL;  Surgeon: Malissa Hippo, MD;  Location: [facility];  Service: Endoscopy;  Laterality: N/A;    Family History  Problem Relation Age of Onset  . Diabetes Mother   . Coronary artery disease Father   . Gout Father   . Breast  cancer Sister   . Pancreatic cancer Brother   . Alcohol abuse Brother   . Healthy Brother   . Lung cancer Sister   . Healthy Sister   . Depression Sister   . Hyperlipidemia Daughter   . Hyperlipidemia Son   . Bipolar disorder Son   . Healthy Sister   . Healthy Sister   . Seizures Other     Social History   Tobacco Use  . Smoking status: Never Smoker  . Smokeless tobacco: Never Used  Vaping Use  . Vaping Use: Never used  Substance Use Topics  . Alcohol use: No    Alcohol/week: 0.0 standard drinks  . Drug use: No     Home Medications Prior to Admission medications   Medication Sig Start Date End Date Taking?  Authorizing Provider  ARIPiprazole (ABILIFY) 10 MG tablet Take 1 tablet (10 mg total) by mouth at bedtime. 08/20/19   Myrlene Broker, MD  carvedilol (COREG) 25 MG tablet Take 25 mg by mouth 2 (two) times daily with a meal.  12/10/19   [provider]  Cholecalciferol (VITAMIN D) 400 UNITS capsule Take 400 Units by mouth daily.    [provider]  esomeprazole (NEXIUM) 40 MG capsule Take 1 capsule (40 mg total) by mouth daily before breakfast. 01/01/20   Rehman, Joline Maxcy, MD  Fish Oil-Cholecalciferol (FISH OIL + D3 PO) Take 1 capsule by mouth 2 (two) times daily.     [provider]  HYDROcodone-acetaminophen (NORCO/VICODIN) 5-325 MG tablet Take 1 tablet by mouth every 6 (six) hours as needed for moderate pain.    [provider]  hyoscyamine (LEVSIN SL) 0.125 MG SL tablet PLACE 1-2 TABLETS UNDER THE TONGUE 2 (TWO) TIMES DAILY AS NEEDED. Patient taking differently: Take 0.125-0.25 mg by mouth 2 (two) times daily as needed for cramping. 05/14/19   Malissa Hippo, MD  lamoTRIgine (LAMICTAL) 200 MG tablet Take 1 tablet (200 mg total) by mouth daily. 08/20/19   Myrlene Broker, MD  losartan (COZAAR) 100 MG tablet Take 100 mg by mouth daily. 04/05/19   [provider]  omeprazole (PRILOSEC) 20 MG capsule Take 20 mg by mouth 2 (two) times daily before a meal.    [provider]  ondansetron (ZOFRAN) 4 MG tablet Take 1 tablet (4 mg total) by mouth every 8 (eight) hours as needed for nausea or vomiting. 12/29/19   Rehman, Joline Maxcy, MD  polyethylene glycol powder (GLYCOLAX/MIRALAX) powder Take 17 grams daily prn for Constipation. Patient taking differently: Take 17 g by mouth daily as needed for moderate constipation. Take 17 grams daily prn for Constipation. 01/10/18   Rehman, Joline Maxcy, MD  potassium chloride (MICRO-K) 10 MEQ CR capsule Take 2 capsules (20 mEq total) by mouth daily as needed (Take potassium on days when you take torsemide.). Patient taking  differently: No sig reported 06/17/19   Malissa Hippo, MD  simvastatin (ZOCOR) 20 MG tablet Take 20 mg by mouth every evening.    [provider]  torsemide (DEMADEX) 20 MG tablet Take 20 mg by mouth daily.  12/10/19   [provider]  vitamin B-12 (CYANOCOBALAMIN) 1000 MCG tablet Take 1,000 mcg by mouth daily.    [provider]  zolpidem (AMBIEN) 10 MG tablet Take 1 tablet (10 mg total) by mouth at bedtime. 08/20/19   Myrlene Broker, MD     Allergies    Codeine, Ciprofloxacin, Flagyl [metronidazole], and Oxycodone   Review of Systems  Review of Systems A comprehensive review of systems was completed and negative except as noted in HPI.    Physical Exam BP (!) 178/36   Pulse 91   Temp 98.1 F (36.7 C) (Oral)   Resp 20   Ht 5\' 4"  (1.626 m)   Wt 85.3 kg   SpO2 100%   BMI 32.27 kg/m   Physical Exam Vitals and nursing note reviewed.  Constitutional:      Appearance: Normal appearance.  HENT:     Head: Normocephalic and atraumatic.     Nose: Nose normal.     Mouth/Throat:     Mouth: Mucous membranes are moist.  Eyes:     Extraocular Movements: Extraocular movements intact.     Conjunctiva/sclera: Conjunctivae normal.  Cardiovascular:     Rate and Rhythm: Normal rate.  Pulmonary:     Effort: Pulmonary effort is normal.     Breath sounds: Normal breath sounds.  Abdominal:     General: Abdomen is flat.     Palpations: Abdomen is soft.     Tenderness: There is no abdominal tenderness.  Musculoskeletal:        General: No swelling. Normal range of motion.     Cervical back: Neck supple.  Skin:    General: Skin is warm and dry.  Neurological:     General: No focal deficit present.     Mental Status: She is alert.  Psychiatric:        Mood and Affect: Mood normal.      ED Results / Procedures / Treatments   Labs (all labs ordered are listed, but only abnormal results are displayed) Labs Reviewed  CBC - Abnormal; Notable for the  following components:      Result Value   RBC 3.27 (*)    Hemoglobin 8.5 (*)    HCT 29.0 (*)    MCHC 29.3 (*)    All other components within normal limits  COMPREHENSIVE METABOLIC PANEL - Abnormal; Notable for the following components:   Glucose, Bld 104 (*)    Creatinine, Ser 1.17 (*)    Albumin 3.4 (*)    GFR, Estimated 48 (*)    All other components within normal limits  RESP PANEL BY RT-PCR (FLU A&B, COVID) ARPGX2  BRAIN NATRIURETIC PEPTIDE  TROPONIN I (HIGH SENSITIVITY)  TROPONIN I (HIGH SENSITIVITY)    EKG EKG Interpretation  Date/Time:  Tuesday January 20 2020 11:19:10 EST Ventricular Rate:  86 PR Interval:    QRS Duration: 90 QT Interval:  368 QTC Calculation: 441 R Axis:   111 Text Interpretation: Sinus rhythm Atrial premature complexes Prolonged PR interval Probable lateral infarct, age indeterminate No significant change since last tracing EARLIER SAME DATE Confirmed by [phone] [id number]) on 01/20/2020 11:32:23 AM   Radiology DG Chest Portable 1 View  Result Date: 01/20/2020 CLINICAL DATA:  Shortness of breath EXAM: PORTABLE CHEST 1 VIEW COMPARISON:  July 25 2013 FINDINGS: Lungs are clear. Heart is borderline enlarged with pulmonary vascularity normal. No adenopathy. There is aortic atherosclerosis. There is postoperative change in the lower cervical region. IMPRESSION: No edema or airspace opacity.  Borderline cardiac enlargement. Aortic Atherosclerosis (ICD10-I70.0). Electronically Signed   By: [birth year]1987 III M.D.   On: 01/20/2020 11:03    Procedures Procedures  Medications Ordered in the ED Medications - No data to display   MDM Rules/Calculators/A&P MDM Per RN, difficulty obtaining a valid BP off the monitor. He is going to try a manual. Will check labs,  CXR and EKG for CHF, PNA and Covid. She is improved back on supplemental oxygen.   ED Course  I have reviewed the triage vital signs and the nursing notes.  Pertinent labs & imaging  results that were available during my care of the patient were reviewed by me and considered in my medical decision making (see chart for details).  Clinical Course as of 01/20/20 1445  Tue Jan 20, 2020  1221 CBC with significant drop in Hgb from previous. This may be contributing to her dyspnea. Will add hemoccult.  [CS]  1233 Covid is neg.  [CS]  1246 CMP and Trop are neg. Husband states patient was anemic during her hospital stay, but he does not know what her numbers were. I attempted [place] several times but no answer. I will try back after lunchtime.  [CS]  1251 BNP is normal.  [CS]  1316 Per staff at patient's PCP office, labs checked on 11/19 did not include H/H.  [CS]  1324 [facility]oke with [facility] staff [facility] who report her baseline Hgb is around 8.5 so no significant change today and no indication to pursue further today.  [CS]  1408 Spoke with Kristen, GI PA who advises that patient should be able to get her endoscopy as scheduled tomorrow.  [CS]    Clinical Course User Index [CS] Pollyann SavoySheldon, Peace Jost B, MD    Final Clinical Impression(s) / ED Diagnoses Final diagnoses:  Chronic respiratory failure with hypoxia [facility](HCC)    Rx / DC Orders ED Discharge Orders    None       Pollyann SavoySheldon, Aubrina Nieman B, MD 01/20/20 1446

## 2020-01-20 NOTE — Discharge Instructions (Addendum)
Please return tomorrow for your scheduled endoscopy.

## 2020-01-20 NOTE — ED Triage Notes (Signed)
Pt was at the hospital for pre-op testing and was C/O dizziness and SHOB. Pt 80% on RA upon arrival. Pt reports wearing O2 at night and "sometimes" during the day.

## 2020-01-20 NOTE — Progress Notes (Signed)
Patient presents to Short Stay for admission testing.  On arrival, patient is SOB with sats around 88 percent.  BP 195/145 sitting and 152/132 with recheck 30 minutes later.  Patient became very weak and felt like she might pass out after getting up to the bathroom.  She was admitted twice in November to [facility] with pneumonia and Congestive Heart Failure.  She still has a chronic cough today.  Contacted Dr Earna Coder for a follow up appointment today in the office.  The office has no appointments available so they wanted her to be transferred to the Emergency Room. Procedure is cancelled until patient is cleared for surgery.  Dr  Patty Sermons office  notified of cancellation.

## 2020-01-21 ENCOUNTER — Telehealth (INDEPENDENT_AMBULATORY_CARE_PROVIDER_SITE_OTHER): Payer: Self-pay | Admitting: *Deleted

## 2020-01-21 NOTE — Telephone Encounter (Signed)
Mr. Jolley was called he states that the patient is to sick to come to her appointment for the procedure today. He says that when she came to hospital yesterday to have pre op her O2 was low , BP was high, she was sent over to the ED for further evaluation.  The patient's vital signs this morning was BP 182/55 , Pulse 58 and O2 was good. She has gone back to bed and reports to her husband that she is deathly sick on her stomach. This occurred after the patient eats food.  Mr. Borrayo wants Dr.Rehman to know and to see if he has further recommendations.  Dr.Rehman was made aware. He recommends that the patient's husband talk with cardiologist or he may bring her to the ED for further evaluation.  Mr. Avery was made aware.

## 2020-01-28 NOTE — Patient Instructions (Signed)
20    Your procedure is scheduled on: 02/04/2020  Report to [facility] at   11:45  AM.  Call this number if you have problems the morning of surgery: [phone]   Remember:   Follow instructions on letter from office regarding when to stop eating and drinking        No Smoking the day of procedure      Take these medicines the morning of surgery with A SIP OF WATER: Carvedilol, nexium, lamictal and zoloft    Do not wear jewelry, make-up or nail polish.  Do not wear lotions, powders, or perfumes. You may wear deodorant.                Do not bring valuables to the hospital.  Contacts, dentures or bridgework may not be worn into surgery.  Leave suitcase in the car. After surgery it may be brought to your room.  For patients admitted to the hospital, checkout time is 11:00 AM the day of discharge.   Patients discharged the day of surgery will not be allowed to drive home. Upper Endoscopy, Adult Upper endoscopy is a procedure to look inside the upper GI (gastrointestinal) tract. The upper GI tract is made up of:  The part of the body that moves food from your mouth to your stomach (esophagus).  The stomach.  The first part of your small intestine (duodenum). This procedure is also called esophagogastroduodenoscopy (EGD) or gastroscopy. In this procedure, your health care provider passes a thin, flexible tube (endoscope) through your mouth and down your esophagus into your stomach. A small camera is attached to the end of the tube. Images from the camera appear on a monitor in the exam room. During this procedure, your health care provider may also remove a small piece of tissue to be sent to a lab and examined under a microscope (biopsy). Your health care provider may do an upper endoscopy to diagnose cancers of the upper GI tract. You may also have this procedure to find the cause of other conditions, such as:  Stomach pain.  Heartburn.  Pain or problems when swallowing.  Nausea and  vomiting.  Stomach bleeding.  Stomach ulcers. Tell a health care provider about:  Any allergies you have.  All medicines you are taking, including vitamins, herbs, eye drops, creams, and over-the-counter medicines.  Any problems you or family members have had with anesthetic medicines.  Any blood disorders you have.  Any surgeries you have had.  Any medical conditions you have.  Whether you are pregnant or may be pregnant. What are the risks? Generally, this is a safe procedure. However, problems may occur, including:  Infection.  Bleeding.  Allergic reactions to medicines.  A tear or hole (perforation) in the esophagus, stomach, or duodenum. What happens before the procedure? Staying hydrated Follow instructions from your health care provider about hydration, which may include:  Up to 2 hours before the procedure - you may continue to drink clear liquids, such as water, clear fruit juice, black coffee, and plain tea.  Eating and drinking restrictions Follow instructions from your health care provider about eating and drinking, which may include:  8 hours before the procedure - stop eating heavy meals or foods, such as meat, fried foods, or fatty foods.  6 hours before the procedure - stop eating light meals or foods, such as toast or cereal.  6 hours before the procedure - stop drinking milk or drinks that contain milk.  2 hours  before the procedure - stop drinking clear liquids. Medicines Ask your health care provider about:  Changing or stopping your regular medicines. This is especially important if you are taking diabetes medicines or blood thinners.  Taking medicines such as aspirin and ibuprofen. These medicines can thin your blood. Do not take these medicines unless your health care provider tells you to take them.  Taking over-the-counter medicines, vitamins, herbs, and supplements. General instructions  Plan to have someone take you home from the  hospital or clinic.  If you will be going home right after the procedure, plan to have someone with you for 24 hours.  Ask your health care provider what steps will be taken to help prevent infection. What happens during the procedure?  1. An IV will be inserted into one of your veins. 2. You may be given one or more of the following: ? A medicine to help you relax (sedative). ? A medicine to numb the throat (local anesthetic). 3. You will lie on your left side on an exam table. 4. Your health care provider will pass the endoscope through your mouth and down your esophagus. 5. Your health care provider will use the scope to check the inside of your esophagus, stomach, and duodenum. Biopsies may be taken. 6. The endoscope will be removed. The procedure may vary among health care providers and hospitals. What happens after the procedure?  Your blood pressure, heart rate, breathing rate, and blood oxygen level will be monitored until you leave the hospital or clinic.  Do not drive for 24 hours if you were given a sedative during your procedure.  When your throat is no longer numb, you may be given some fluids to drink.  It is up to you to get the results of your procedure. Ask your health care provider, or the department that is doing the procedure, when your results will be ready. Summary  Upper endoscopy is a procedure to look inside the upper GI tract.  During the procedure, an IV will be inserted into one of your veins. You may be given a medicine to help you relax.  A medicine will be used to numb your throat.  The endoscope will be passed through your mouth and down your esophagus. This information is not intended to replace advice given to you by your health care provider. Make sure you discuss any questions you have with your health care provider. Document Revised: 07/11/2017 Document Reviewed: 06/18/2017 Elsevier Patient Education  2020 [employer]                                                                                                                                       [facility] After  Please read the instructions outlined below and refer to this sheet in the next few weeks. These discharge instructions provide you with general information on caring for yourself after you leave the hospital. Your  doctor may also give you specific instructions. While your treatment has been planned according to the most current medical practices available, unavoidable complications occasionally occur. If you have any problems or questions after discharge, please call your doctor. HOME CARE INSTRUCTIONS Activity  You may resume your regular activity but move at a slower pace for the next 24 hours.   Take frequent rest periods for the next 24 hours.   Walking will help expel (get rid of) the air and reduce the bloated feeling in your abdomen.   No driving for 24 hours (because of the anesthesia (medicine) used during the test).   You may shower.   Do not sign any important legal documents or operate any machinery for 24 hours (because of the anesthesia used during the test).  Nutrition  Drink plenty of fluids.   You may resume your normal diet.   Begin with a light meal and progress to your normal diet.   Avoid alcoholic beverages for 24 hours or as instructed by your caregiver.  Medications You may resume your normal medications unless your caregiver tells you otherwise. What you can expect today  You may experience abdominal discomfort such as a feeling of fullness or "gas" pains.   You may experience a sore throat for 2 to 3 days. This is normal. Gargling with salt water may help this.  Follow-up Your doctor will discuss the results of your test with you. SEEK IMMEDIATE MEDICAL CARE IF:  You have excessive nausea (feeling sick to your stomach) and/or vomiting.   You have severe abdominal pain and distention (swelling).   You have trouble  swallowing.   You have a temperature over 100 F (37.8 C).   You have rectal bleeding or vomiting of blood.  Document Released: 08/31/2003 Document Revised: 01/05/2011 Document Reviewed: 03/13/2007

## 2020-01-29 ENCOUNTER — Other Ambulatory Visit: Payer: Self-pay

## 2020-01-29 ENCOUNTER — Encounter (HOSPITAL_COMMUNITY)
Admission: RE | Admit: 2020-01-29 | Discharge: 2020-01-29 | Disposition: A | Discharge status: Home / Self Care | Payer: Medicare Other | Source: Ambulatory Visit | Attending: Internal Medicine | Admitting: Internal Medicine

## 2020-02-02 ENCOUNTER — Other Ambulatory Visit (HOSPITAL_COMMUNITY)
Admission: RE | Admit: 2020-02-02 | Discharge: 2020-02-02 | Disposition: A | Discharge status: Home / Self Care | Payer: Medicare Other | Source: Ambulatory Visit | Attending: Internal Medicine | Admitting: Internal Medicine

## 2020-02-03 ENCOUNTER — Other Ambulatory Visit (HOSPITAL_COMMUNITY)
Admission: RE | Admit: 2020-02-03 | Discharge: 2020-02-03 | Disposition: A | Discharge status: Home / Self Care | Payer: Medicare Other | Source: Ambulatory Visit | Attending: Internal Medicine | Admitting: Internal Medicine

## 2020-02-03 NOTE — Progress Notes (Signed)
Patient had her husband cancel her procedure 01/21/20. She was too sick to come in. (please refer to previous note, for more information.)  Patient has not called back to reschedule.

## 2020-02-04 ENCOUNTER — Ambulatory Visit (HOSPITAL_COMMUNITY): Admission: RE | Admit: 2020-02-04 | Payer: Medicare Other | Source: Home / Self Care | Admitting: Internal Medicine

## 2020-02-04 ENCOUNTER — Encounter (HOSPITAL_COMMUNITY): Admission: RE | Payer: Self-pay | Source: Home / Self Care

## 2020-02-04 SURGERY — ESOPHAGOGASTRODUODENOSCOPY (EGD) WITH PROPOFOL
Anesthesia: Monitor Anesthesia Care

## 2020-02-05 ENCOUNTER — Other Ambulatory Visit (INDEPENDENT_AMBULATORY_CARE_PROVIDER_SITE_OTHER): Payer: Self-pay | Admitting: Internal Medicine

## 2020-02-05 NOTE — Telephone Encounter (Signed)
Last seen 01/01/2020 for Gerd by Dr. Karilyn Cota

## 2020-02-10 ENCOUNTER — Encounter (INDEPENDENT_AMBULATORY_CARE_PROVIDER_SITE_OTHER): Payer: Self-pay

## 2020-02-10 ENCOUNTER — Other Ambulatory Visit (INDEPENDENT_AMBULATORY_CARE_PROVIDER_SITE_OTHER): Payer: Self-pay

## 2020-02-11 NOTE — Patient Instructions (Signed)
20    Your procedure is scheduled on: 02/18/2020  Report to [facility] at   10:45  AM.  Call this number if you have problems the morning of surgery: [phone]   Remember:   Follow instructions on letter from office regarding when to stop eating and drinking        No Smoking the day of procedure      Take these medicines the morning of surgery with A SIP OF WATER: Carvedilol and nexium    Hydrocodone if needed   Do not wear jewelry, make-up or nail polish.  Do not wear lotions, powders, or perfumes. You may wear deodorant.                Do not bring valuables to the hospital.  Contacts, dentures or bridgework may not be worn into surgery.  Leave suitcase in the car. After surgery it may be brought to your room.  For patients admitted to the hospital, checkout time is 11:00 AM the day of discharge.   Patients discharged the day of surgery will not be allowed to drive home. Upper Endoscopy, Adult Upper endoscopy is a procedure to look inside the upper GI (gastrointestinal) tract. The upper GI tract is made up of:  The part of the body that moves food from your mouth to your stomach (esophagus).  The stomach.  The first part of your small intestine (duodenum). This procedure is also called esophagogastroduodenoscopy (EGD) or gastroscopy. In this procedure, your health care provider passes a thin, flexible tube (endoscope) through your mouth and down your esophagus into your stomach. A small camera is attached to the end of the tube. Images from the camera appear on a monitor in the exam room. During this procedure, your health care provider may also remove a small piece of tissue to be sent to a lab and examined under a microscope (biopsy). Your health care provider may do an upper endoscopy to diagnose cancers of the upper GI tract. You may also have this procedure to find the cause of other conditions, such as:  Stomach pain.  Heartburn.  Pain or problems when  swallowing.  Nausea and vomiting.  Stomach bleeding.  Stomach ulcers. Tell a health care provider about:  Any allergies you have.  All medicines you are taking, including vitamins, herbs, eye drops, creams, and over-the-counter medicines.  Any problems you or family members have had with anesthetic medicines.  Any blood disorders you have.  Any surgeries you have had.  Any medical conditions you have.  Whether you are pregnant or may be pregnant. What are the risks? Generally, this is a safe procedure. However, problems may occur, including:  Infection.  Bleeding.  Allergic reactions to medicines.  A tear or hole (perforation) in the esophagus, stomach, or duodenum. What happens before the procedure? Staying hydrated Follow instructions from your health care provider about hydration, which may include:  Up to 2 hours before the procedure - you may continue to drink clear liquids, such as water, clear fruit juice, black coffee, and plain tea.  Eating and drinking restrictions Follow instructions from your health care provider about eating and drinking, which may include:  8 hours before the procedure - stop eating heavy meals or foods, such as meat, fried foods, or fatty foods.  6 hours before the procedure - stop eating light meals or foods, such as toast or cereal.  6 hours before the procedure - stop drinking milk or drinks that contain milk.  2 hours before the procedure - stop drinking clear liquids. Medicines Ask your health care provider about:  Changing or stopping your regular medicines. This is especially important if you are taking diabetes medicines or blood thinners.  Taking medicines such as aspirin and ibuprofen. These medicines can thin your blood. Do not take these medicines unless your health care provider tells you to take them.  Taking over-the-counter medicines, vitamins, herbs, and supplements. General instructions  Plan to have someone  take you home from the hospital or clinic.  If you will be going home right after the procedure, plan to have someone with you for 24 hours.  Ask your health care provider what steps will be taken to help prevent infection. What happens during the procedure?  1. An IV will be inserted into one of your veins. 2. You may be given one or more of the following: ? A medicine to help you relax (sedative). ? A medicine to numb the throat (local anesthetic). 3. You will lie on your left side on an exam table. 4. Your health care provider will pass the endoscope through your mouth and down your esophagus. 5. Your health care provider will use the scope to check the inside of your esophagus, stomach, and duodenum. Biopsies may be taken. 6. The endoscope will be removed. The procedure may vary among health care providers and hospitals. What happens after the procedure?  Your blood pressure, heart rate, breathing rate, and blood oxygen level will be monitored until you leave the hospital or clinic.  Do not drive for 24 hours if you were given a sedative during your procedure.  When your throat is no longer numb, you may be given some fluids to drink.  It is up to you to get the results of your procedure. Ask your health care provider, or the department that is doing the procedure, when your results will be ready. Summary  Upper endoscopy is a procedure to look inside the upper GI tract.  During the procedure, an IV will be inserted into one of your veins. You may be given a medicine to help you relax.  A medicine will be used to numb your throat.  The endoscope will be passed through your mouth and down your esophagus. This information is not intended to replace advice given to you by your health care provider. Make sure you discuss any questions you have with your health care provider. Document Revised: 07/11/2017 Document Reviewed: 06/18/2017 Elsevier Patient Education  [place] After  Please read the instructions outlined below and refer to this sheet in the next few weeks. These discharge instructions provide you with general information on caring for yourself after you leave the  hospital. Your doctor may also give you specific instructions. While your treatment has been planned according to the most current medical practices available, unavoidable complications occasionally occur. If you have any problems or questions after discharge, please call your doctor. HOME CARE INSTRUCTIONS Activity  You may resume your regular activity but move at a slower pace for the next 24 hours.   Take frequent rest periods for the next 24 hours.   Walking will help expel (get rid of) the air and reduce the bloated feeling in your abdomen.   No driving for 24 hours (because of the anesthesia (medicine) used during the test).   You may shower.   Do not sign any important legal documents or operate any machinery for 24 hours (because of the anesthesia used during the test).  Nutrition  Drink plenty of fluids.   You may resume your normal diet.   Begin with a light meal and progress to your normal diet.   Avoid alcoholic beverages for 24 hours or as instructed by your caregiver.  Medications You may resume your normal medications unless your caregiver tells you otherwise. What you can expect today  You may experience abdominal discomfort such as a feeling of fullness or "gas" pains.   You may experience a sore throat for 2 to 3 days. This is normal. Gargling with salt water may help this.  Follow-up Your doctor will discuss the results of your test with you. SEEK IMMEDIATE MEDICAL CARE IF:  You have excessive nausea (feeling sick to your stomach) and/or vomiting.   You have severe abdominal pain and distention  (swelling).   You have trouble swallowing.   You have a temperature over 100 F (37.8 C).   You have rectal bleeding or vomiting of blood.  Document Released: 08/31/2003 Document Revised: 01/05/2011 Document Reviewed: 03/13/2007

## 2020-02-12 ENCOUNTER — Encounter (HOSPITAL_COMMUNITY)
Admission: RE | Admit: 2020-02-12 | Discharge: 2020-02-12 | Disposition: A | Discharge status: Home / Self Care | Payer: Medicare Other | Source: Ambulatory Visit | Attending: Internal Medicine | Admitting: Internal Medicine

## 2020-02-12 ENCOUNTER — Other Ambulatory Visit: Payer: Self-pay

## 2020-02-16 ENCOUNTER — Other Ambulatory Visit (HOSPITAL_COMMUNITY)
Admission: RE | Admit: 2020-02-16 | Discharge: 2020-02-16 | Disposition: A | Discharge status: Home / Self Care | Payer: Medicare Other | Source: Ambulatory Visit | Attending: Internal Medicine | Admitting: Internal Medicine

## 2020-02-16 ENCOUNTER — Telehealth (HOSPITAL_COMMUNITY): Payer: Medicare Other | Admitting: Psychiatry

## 2020-02-16 ENCOUNTER — Other Ambulatory Visit (HOSPITAL_COMMUNITY): Payer: Medicare Other

## 2020-02-17 ENCOUNTER — Telehealth (INDEPENDENT_AMBULATORY_CARE_PROVIDER_SITE_OTHER): Payer: Medicare Other | Admitting: Psychiatry

## 2020-02-17 ENCOUNTER — Encounter (HOSPITAL_COMMUNITY): Payer: Self-pay | Admitting: Psychiatry

## 2020-02-17 ENCOUNTER — Other Ambulatory Visit (HOSPITAL_COMMUNITY)
Admission: RE | Admit: 2020-02-17 | Discharge: 2020-02-17 | Disposition: A | Discharge status: Home / Self Care | Payer: Medicare Other | Source: Ambulatory Visit | Attending: Internal Medicine | Admitting: Internal Medicine

## 2020-02-17 ENCOUNTER — Other Ambulatory Visit: Payer: Self-pay

## 2020-02-17 ENCOUNTER — Other Ambulatory Visit (HOSPITAL_COMMUNITY): Admission: RE | Admit: 2020-02-17 | Payer: Medicare Other | Source: Ambulatory Visit

## 2020-02-17 DIAGNOSIS — F3130 Bipolar disorder, current episode depressed, mild or moderate severity, unspecified: Secondary | ICD-10-CM

## 2020-02-17 MED ORDER — ARIPIPRAZOLE 10 MG PO TABS: 10.0000 mg | ORAL_TABLET | Freq: Every day | ORAL | 2 refills | Status: DC

## 2020-02-17 MED ORDER — LAMOTRIGINE 200 MG PO TABS: 200.0000 mg | ORAL_TABLET | Freq: Every day | ORAL | 2 refills | Status: DC

## 2020-02-17 MED ORDER — ZOLPIDEM TARTRATE 10 MG PO TABS: 10.0000 mg | ORAL_TABLET | Freq: Every day | ORAL | 2 refills | Status: DC

## 2020-02-17 NOTE — Progress Notes (Signed)
Virtual Visit via Telephone Note  I connected with Lindsay Palmer on 02/17/20 at  2:20 PM EST by telephone and verified that I am speaking with the correct person using two identifiers.  Location: Patient: home Provider: home   I discussed the limitations, risks, security and privacy concerns of performing an evaluation and management service by telephone and the availability of in person appointments. I also discussed with the patient that there may be a patient responsible charge related to this service. The patient expressed understanding and agreed to proceed.     I discussed the assessment and treatment plan with the patient. The patient was provided an opportunity to ask questions and all were answered. The patient agreed with the plan and demonstrated an understanding of the instructions.   The patient was advised to call back or seek an in-person evaluation if the symptoms worsen or if the condition fails to improve as anticipated.  I provided 15 minutes of non-face-to-face time during this encounter.   Diannia Ruder, MD  [facility] MD/PA/NP OP Progress Note  02/17/2020 2:30 PM Lindsay Palmer  MRN:  [id number]  Chief Complaint:  Chief Complaint    Anxiety; Depression; Manic Behavior; Follow-up     HPI: This patient is a 80 year old married white female who lives with her husband in [place].  She is retired from [employer]  She has 1 son and 1 daughter and 3 grandchildren.  The patient returns for follow-up after 6 months.  She states that in the last several months she has been hospitalized twice for pneumonia and hypertension.  Her blood pressures are still running somewhat high but better than they were.  She states throughout all this she has been doing well in terms of her moods.  She denies depression thoughts of self-harm or suicide.  She denies racing thoughts irritability anger or difficulty focusing.  She is sleeping well with the Ambien.  She does not have any  other manic symptoms. Visit Diagnosis:    ICD-10-CM   1. Bipolar I disorder, most recent episode depressed (HCC)  F31.30     Past Psychiatric History: Long-term outpatient treatment for bipolar disorder  Past Medical History:  Past Medical History:  Diagnosis Date  . Bipolar 2 disorder (HCC)   . Chronic insomnia   . Congestive heart failure (CHF) (HCC) 12/2019  . Diverticulosis of colon   . Dyspnea   . Elevated LFTs   . Fatty liver disease, nonalcoholic   . GERD (gastroesophageal reflux disease)   . H/O first degree atrioventricular block   . Hypercholesteremia   . Hypertension   . Major depressive disorder   . Migraine   . Osteopenia   . Pancreatitis   . Pneumonia   . Sleep apnea    Stop Bang score of 4. Pt said she had sleep apnea at one time, but she has had another sleep study since then and they said she does not have it anymore.  . Vitamin D deficiency     Past Surgical History:  Procedure Laterality Date  . BILIARY STENT PLACEMENT N/A 07/18/2013   Procedure: BILIARY STENT PLACEMENT;  Surgeon: Malissa Hippo, MD;  Location: [facility];  Service: Endoscopy;  Laterality: N/A;  . Cataract Surgery Bilateral 08-2012  . CHOLECYSTECTOMY    . COLONOSCOPY  2003   Diverticulitis h/o polyps -due next 2015 -gastroscopy 2003  . ERCP    . ERCP N/A 07/18/2013   Procedure: ENDOSCOPIC RETROGRADE CHOLANGIOPANCREATOGRAPHY (ERCP) COMMON BILE DUCT BRUSHING;  Surgeon: Malissa Hippo, MD;  Location: [facility];  Service: Endoscopy;  Laterality: N/A;  . ERCP N/A 11/07/2013   Procedure: ENDOSCOPIC RETROGRADE CHOLANGIOPANCREATOGRAPHY (ERCP);  Surgeon: Malissa Hippo, MD;  Location: [facility];  Service: Endoscopy;  Laterality: N/A;  . ESOPHAGEAL DILATION N/A 08/23/2016   Procedure: ESOPHAGEAL DILATION;  Surgeon: Malissa Hippo, MD;  Location: [facility];  Service: Endoscopy;  Laterality: N/A;  . ESOPHAGOGASTRODUODENOSCOPY N/A 08/23/2016   Procedure: ESOPHAGOGASTRODUODENOSCOPY (EGD);  Surgeon:  Malissa Hippo, MD;  Location: [facility];  Service: Endoscopy;  Laterality: N/A;  245-moved up to 100 per [name]  . EYE SURGERY Bilateral    cataract removal  . NECK SURGERY     Stenosis C6-C7  . PARTIAL COLECTOMY  2018  . SIGMOIDOSCOPY  2007   no polyps  . SPHINCTEROTOMY N/A 07/18/2013   Procedure: SPHINCTEROTOMY;  Surgeon: Malissa Hippo, MD;  Location: [facility];  Service: Endoscopy;  Laterality: N/A;  . SPHINCTEROTOMY  11/07/2013   Procedure: EXTENSION OF SPHINCTEROTOMY;  Surgeon: Malissa Hippo, MD;  Location: [facility];  Service: Endoscopy;;  . STENT REMOVAL N/A 11/07/2013   Procedure: STENT REMOVAL;  Surgeon: Malissa Hippo, MD;  Location: [facility];  Service: Endoscopy;  Laterality: N/A;    Family Psychiatric History: see below  Family History:  Family History  Problem Relation Age of Onset  . Diabetes Mother   . Coronary artery disease Father   . Gout Father   . Breast cancer Sister   . Pancreatic cancer Brother   . Alcohol abuse Brother   . Healthy Brother   . Lung cancer Sister   . Healthy Sister   . Depression Sister   . Hyperlipidemia Daughter   . Hyperlipidemia Son   . Bipolar disorder Son   . Healthy Sister   . Healthy Sister   . Seizures Other     Social History:  Social History   Socioeconomic History  . Marital status: Married    Spouse name: Not on file  . Number of children: Not on file  . Years of education: Not on file  . Highest education level: Not on file  Occupational History  . Not on file  Tobacco Use  . Smoking status: Never Smoker  . Smokeless tobacco: Never Used  Vaping Use  . Vaping Use: Never used  Substance and Sexual Activity  . Alcohol use: No    Alcohol/week: 0.0 standard drinks  . Drug use: No  . Sexual activity: Not on file  Other Topics Concern  . Not on file  Social History Narrative  . Not on file   Social Determinants of Health   Financial Resource Strain: Not on file  Food Insecurity: Not on file   Transportation Needs: Not on file  Physical Activity: Not on file  Stress: Not on file  Social Connections: Not on file    Allergies:  Allergies  Allergen Reactions  . Codeine Nausea And Vomiting  . Ciprofloxacin     Nausea and Vomiting  . Flagyl [Metronidazole]     Nausea and vomiting  . Oxycodone Nausea And Vomiting    Metabolic Disorder Labs: No results found for: HGBA1C, MPG No results found for: PROLACTIN Lab Results  Component Value Date   TRIG 113 07/28/2013   Lab Results  Component Value Date   TSH 1.213 09/14/2014    Therapeutic Level Labs: No results found for: LITHIUM No results found for: VALPROATE No components found for:  CBMZ  Current Medications: Current Outpatient Medications  Medication Sig Dispense Refill  . ARIPiprazole (ABILIFY) 10 MG tablet Take 1 tablet (10 mg total) by mouth at bedtime. 90 tablet 2  . carvedilol (COREG) 25 MG tablet Take 25 mg by mouth 2 (two) times daily with a meal.     . Cholecalciferol (VITAMIN D) 400 UNITS capsule Take 400 Units by mouth daily.    Marland Kitchen. esomeprazole (NEXIUM) 40 MG capsule Take 1 capsule (40 mg total) by mouth daily before breakfast. 30 capsule 5  . Fish Oil-Cholecalciferol (FISH OIL + D3 PO) Take 1 capsule by mouth 2 (two) times daily.     Marland Kitchen. HYDROcodone-acetaminophen (NORCO/VICODIN) 5-325 MG tablet Take 1 tablet by mouth every 6 (six) hours as needed for moderate pain.    . hyoscyamine (LEVSIN SL) 0.125 MG SL tablet PLACE 1-2 TABLETS UNDER THE TONGUE 2 (TWO) TIMES DAILY AS NEEDED. (Patient taking differently: Take 0.125-0.25 mg by mouth 2 (two) times daily as needed for cramping.) 360 tablet 0  . lamoTRIgine (LAMICTAL) 200 MG tablet Take 1 tablet (200 mg total) by mouth daily. 90 tablet 2  . losartan (COZAAR) 100 MG tablet Take 100 mg by mouth daily.    Marland Kitchen. omeprazole (PRILOSEC) 20 MG capsule Take 20 mg by mouth 2 (two) times daily before a meal.    . ondansetron (ZOFRAN) 4 MG tablet TAKE 1 TABLET BY MOUTH EVERY  8 HOURS AS NEEDED FOR NAUSEA AND VOMITING 60 tablet 1  . polyethylene glycol powder (GLYCOLAX/MIRALAX) powder Take 17 grams daily prn for Constipation. (Patient taking differently: Take 17 g by mouth daily as needed for moderate constipation. Take 17 grams daily prn for Constipation.) 1581 g 3  . potassium chloride (MICRO-K) 10 MEQ CR capsule Take 2 capsules (20 mEq total) by mouth daily as needed (Take potassium on days when you take torsemide.). (Patient taking differently: No sig reported) 180 capsule 0  . simvastatin (ZOCOR) 20 MG tablet Take 20 mg by mouth every evening.    . torsemide (DEMADEX) 20 MG tablet Take 20 mg by mouth daily.     . vitamin B-12 (CYANOCOBALAMIN) 1000 MCG tablet Take 1,000 mcg by mouth daily.    Marland Kitchen. zolpidem (AMBIEN) 10 MG tablet Take 1 tablet (10 mg total) by mouth at bedtime. 90 tablet 2   No current facility-administered medications for this visit.     Musculoskeletal: Strength & Muscle Tone: decreased Gait & Station: unsteady Patient leans: N/A  Psychiatric Specialty Exam: Review of Systems  Musculoskeletal: Positive for arthralgias and gait problem.  All other systems reviewed and are negative.   There were no vitals taken for this visit.There is no height or weight on file to calculate BMI.  General Appearance: NA  Eye Contact:  NA  Speech:  Clear and Coherent  Volume:  Normal  Mood:  Euthymic  Affect:  NA  Thought Process:  Goal Directed  Orientation:  Full (Time, Place, and Person)  Thought Content: WDL   Suicidal Thoughts:  No  Homicidal Thoughts:  No  Memory:  Immediate;   Good Recent;   Good Remote;   Good  Judgement:  Good  Insight:  Fair  Psychomotor Activity:  Decreased  Concentration:  Concentration: Good  Recall:  Good  Fund of Knowledge: Good  Language: Good  Akathisia:  No  Handed:  Right  AIMS (if indicated): not done  Assets:  Communication Skills Desire for Improvement Resilience Social Support Talents/Skills  ADL's:   Intact  Cognition: WNL  Sleep:  Good   Screenings:   Assessment and Plan: This patient is a 80 year old female with a history of bipolar disorder.  She continues to be stable on her current regimen.  She will continue Abilify 10 mg at bedtime for mood stabilization, Lamictal 200 mg daily for mood stabilization and Ambien 10 mg at bedtime for sleep.  She will return to see me in 6 months   Diannia Ruder, MD 02/17/2020, 2:30 PM

## 2020-02-18 ENCOUNTER — Ambulatory Visit (HOSPITAL_COMMUNITY): Admission: RE | Admit: 2020-02-18 | Payer: Medicare Other | Source: Home / Self Care | Admitting: Internal Medicine

## 2020-02-18 ENCOUNTER — Encounter (HOSPITAL_COMMUNITY): Admission: RE | Payer: Self-pay | Source: Home / Self Care

## 2020-02-18 SURGERY — ESOPHAGOGASTRODUODENOSCOPY (EGD) WITH PROPOFOL
Anesthesia: Monitor Anesthesia Care

## 2020-02-19 ENCOUNTER — Telehealth (INDEPENDENT_AMBULATORY_CARE_PROVIDER_SITE_OTHER): Payer: Self-pay | Admitting: Internal Medicine

## 2020-02-19 NOTE — Telephone Encounter (Signed)
Spouse called stated he has left 2 messages - would like to reschedule procedure - please advise- ph# [phone]

## 2020-02-20 NOTE — Patient Instructions (Signed)
Lindsay Palmer  02/20/2020     @PREFPERIOPPHARMACY @   Your procedure is scheduled on  02/25/2020.    Report to 02/27/2020 at  0920   A.M.    Call this number if you have problems the morning of surgery:  [phone]     Follow the diet instructions given to you by the office.                      Take these medicines the morning of surgery with A SIP OF WATER  Lamicta, mobic(if needed), zofran (if needed).              Use your nebulizer before you come.   Please brush your teeth.  Do not wear jewelry, make-up or nail polish.  Do not wear lotions, powders, or perfumes, or deodorant.  Do not shave 48 hours prior to surgery.  Men may shave face and neck.  Do not bring valuables to the hospital.  [facility] is not responsible for any belongings or valuables.  Contacts, dentures or bridgework may not be worn into surgery.  Leave your suitcase in the car.  After surgery it may be brought to your room.  For patients admitted to the hospital, discharge time will be determined by your treatment team.  Patients discharged the day of surgery will not be allowed to drive home and they must have someone with them for 24 hours.   Special instructions:   DO NOT smoke (tobacco or vape) the morning of your procedure.   Please read over the following fact sheets that you were given. Anesthesia Post-op Instructions and Care and Recovery After Surgery       Upper Endoscopy, Adult, Care After This sheet gives you information about how to care for yourself after your procedure. Your health care provider may also give you more specific instructions. If you have problems or questions, contact your health care provider. What can I expect after the procedure? After the procedure, it is common to have:  A sore throat.  Mild stomach pain or discomfort.  Bloating.  Nausea. Follow these instructions at home:  Follow instructions from your health care provider about what to eat  or drink after your procedure.  Return to your normal activities as told by your health care provider. Ask your health care provider what activities are safe for you.  Take over-the-counter and prescription medicines only as told by your health care provider.  If you were given a sedative during the procedure, it can affect you for several hours. Do not drive or operate machinery until your health care provider says that it is safe.  Keep all follow-up visits as told by your health care provider. This is important.   Contact a health care provider if you have:  A sore throat that lasts longer than one day.  Trouble swallowing. Get help right away if:  You vomit blood or your vomit looks like coffee grounds.  You have: ? A fever. ? Bloody, black, or tarry stools. ? A severe sore throat or you cannot swallow. ? Difficulty breathing. ? Severe pain in your chest or abdomen. Summary  After the procedure, it is common to have a sore throat, mild stomach discomfort, bloating, and nausea.  If you were given a sedative during the procedure, it can affect you for several hours. Do not drive or operate machinery until your health care provider says that it is safe.  Follow instructions from your health care provider about what to eat or drink after your procedure.  Return to your normal activities as told by your health care provider. This information is not intended to replace advice given to you by your health care provider. Make sure you discuss any questions you have with your health care provider. Document Revised: 01/14/2019 Document Reviewed: 06/18/2017 Elsevier Patient Education  2021 Else[employer] Inc[employer] Monitored Anesthesia Care, Care After This sheet gives you information about how to care for yourself after your procedure. Your health care provider may also give you more specific instructions. If you have problems or questions, contact your health care provider. What can I expect  after the procedure? After the procedure, it is common to have:  Tiredness.  Forgetfulness about what happened after the procedure.  Impaired judgment for important decisions.  Nausea or vomiting.  Some difficulty with balance. Follow these instructions at home: For the time period you were told by your health care provider:  Rest as needed.  Do not participate in activities where you could fall or become injured.  Do not drive or use machinery.  Do not drink alcohol.  Do not take sleeping pills or medicines that cause drowsiness.  Do not make important decisions or sign legal documents.  Do not take care of children on your own.      Eating and drinking  Follow the diet that is recommended by your health care provider.  Drink enough fluid to keep your urine pale yellow.  If you vomit: ? Drink water, juice, or soup when you can drink without vomiting. ? Make sure you have little or no nausea before eating solid foods. General instructions  Have a responsible adult stay with you for the time you are told. It is important to have someone help care for you until you are awake and alert.  Take over-the-counter and prescription medicines only as told by your health care provider.  If you have sleep apnea, surgery and certain medicines can increase your risk for breathing problems. Follow instructions from your health care provider about wearing your sleep device: ? Anytime you are sleeping, including during daytime naps. ? While taking prescription pain medicines, sleeping medicines, or medicines that make you drowsy.  Avoid smoking.  Keep all follow-up visits as told by your health care provider. This is important. Contact a health care provider if:  You keep feeling nauseous or you keep vomiting.  You feel light-headed.  You are still sleepy or having trouble with balance after 24 hours.  You develop a rash.  You have a fever.  You have redness or swelling  around the IV site. Get help right away if:  You have trouble breathing.  You have new-onset confusion at home. Summary  For several hours after your procedure, you may feel tired. You may also be forgetful and have poor judgment.  Have a responsible adult stay with you for the time you are told. It is important to have someone help care for you until you are awake and alert.  Rest as told. Do not drive or operate machinery. Do not drink alcohol or take sleeping pills.  Get help right away if you have trouble breathing, or if you suddenly become confused. This information is not intended to replace advice given to you by your health care provider. Make sure you discuss any questions you have with your health care provider. Document Revised: 10/02/2019 Document Reviewed: 12/19/2018 Elsevier Patient Education  2021  Elsevier Inc. ° °

## 2020-02-23 ENCOUNTER — Telehealth (HOSPITAL_COMMUNITY): Payer: Self-pay | Admitting: Psychiatry

## 2020-02-23 NOTE — Telephone Encounter (Signed)
Patients husband answered and took message for pt to return call to schedule appt, patient was laying down

## 2020-02-24 ENCOUNTER — Other Ambulatory Visit (HOSPITAL_COMMUNITY)
Admission: RE | Admit: 2020-02-24 | Discharge: 2020-02-24 | Disposition: A | Discharge status: Home / Self Care | Payer: Medicare Other | Source: Ambulatory Visit | Attending: Internal Medicine | Admitting: Internal Medicine

## 2020-02-24 ENCOUNTER — Other Ambulatory Visit: Payer: Self-pay

## 2020-02-24 ENCOUNTER — Encounter (HOSPITAL_COMMUNITY)
Admission: RE | Admit: 2020-02-24 | Discharge: 2020-02-24 | Disposition: A | Discharge status: Home / Self Care | Payer: Medicare Other | Source: Ambulatory Visit | Attending: Internal Medicine | Admitting: Internal Medicine

## 2020-02-24 DIAGNOSIS — Z01812 Encounter for preprocedural laboratory examination: Secondary | ICD-10-CM | POA: Diagnosis present

## 2020-02-24 DIAGNOSIS — R1033 Periumbilical pain: Secondary | ICD-10-CM

## 2020-02-24 DIAGNOSIS — R110 Nausea: Secondary | ICD-10-CM

## 2020-02-24 DIAGNOSIS — K219 Gastro-esophageal reflux disease without esophagitis: Secondary | ICD-10-CM

## 2020-02-24 LAB — BASIC METABOLIC PANEL
Anion gap: 11 (ref 5–15)
BUN: 21 mg/dL (ref 8–23)
CO2: 27 mmol/L (ref 22–32)
Calcium: 8.9 mg/dL (ref 8.9–10.3)
Chloride: 103 mmol/L (ref 98–111)
Creatinine, Ser: 1.34 mg/dL — ABNORMAL HIGH (ref 0.44–1.00)
GFR, Estimated: 41 mL/min — ABNORMAL LOW (ref >60)
Glucose, Bld: 96 mg/dL (ref 70–99)
Potassium: 3.5 mmol/L (ref 3.5–5.1)
Sodium: 141 mmol/L (ref 135–145)

## 2020-02-24 LAB — CBC WITH DIFFERENTIAL/PLATELET
Abs Immature Granulocytes: 0.01 10*3/uL (ref 0.00–0.07)
Basophils Absolute: 0 10*3/uL (ref 0.0–0.1)
Basophils Relative: 1 %
Eosinophils Absolute: 0.1 10*3/uL (ref 0.0–0.5)
Eosinophils Relative: 3 %
HCT: 30.3 % — ABNORMAL LOW (ref 36.0–46.0)
Hemoglobin: 8.6 g/dL — ABNORMAL LOW (ref 12.0–15.0)
Immature Granulocytes: 0 %
Lymphocytes Relative: 21 %
Lymphs Abs: 1.2 10*3/uL (ref 0.7–4.0)
MCH: 23.6 pg — ABNORMAL LOW (ref 26.0–34.0)
MCHC: 28.4 g/dL — ABNORMAL LOW (ref 30.0–36.0)
MCV: 83 fL (ref 80.0–100.0)
Monocytes Absolute: 0.6 10*3/uL (ref 0.1–1.0)
Monocytes Relative: 11 %
Neutro Abs: 3.7 10*3/uL (ref 1.7–7.7)
Neutrophils Relative %: 64 %
Platelets: 272 10*3/uL (ref 150–400)
RBC: 3.65 MIL/uL — ABNORMAL LOW (ref 3.87–5.11)
RDW: 15.9 % — ABNORMAL HIGH (ref 11.5–15.5)
WBC: 5.7 10*3/uL (ref 4.0–10.5)
nRBC: 0 % (ref 0.0–0.2)

## 2020-02-24 NOTE — Telephone Encounter (Signed)
Noted, working with Mr Yogi to get her rescheduled

## 2020-02-25 ENCOUNTER — Encounter (HOSPITAL_COMMUNITY)
Admission: RE | Disposition: A | Discharge status: Home / Self Care | Payer: Self-pay | Source: Home / Self Care | Attending: Internal Medicine

## 2020-02-25 ENCOUNTER — Ambulatory Visit (HOSPITAL_COMMUNITY)
Admission: RE | Admit: 2020-02-25 | Discharge: 2020-02-25 | Disposition: A | Discharge status: Home / Self Care | Payer: Medicare Other | Attending: Internal Medicine | Admitting: Internal Medicine

## 2020-02-25 ENCOUNTER — Ambulatory Visit (HOSPITAL_COMMUNITY): Payer: Medicare Other | Admitting: Anesthesiology

## 2020-02-25 ENCOUNTER — Encounter (HOSPITAL_COMMUNITY): Payer: Self-pay | Admitting: Internal Medicine

## 2020-02-25 DIAGNOSIS — Z01818 Encounter for other preprocedural examination: Secondary | ICD-10-CM | POA: Insufficient documentation

## 2020-02-25 DIAGNOSIS — Z538 Procedure and treatment not carried out for other reasons: Secondary | ICD-10-CM | POA: Diagnosis not present

## 2020-02-25 DIAGNOSIS — U071 COVID-19: Secondary | ICD-10-CM | POA: Insufficient documentation

## 2020-02-25 LAB — HEPATIC FUNCTION PANEL
ALT: 13 U/L (ref 0–44)
AST: 17 U/L (ref 15–41)
Albumin: 3.3 g/dL — ABNORMAL LOW (ref 3.5–5.0)
Alkaline Phosphatase: 84 U/L (ref 38–126)
Bilirubin, Direct: 0.1 mg/dL (ref 0.0–0.2)
Indirect Bilirubin: 0.5 mg/dL (ref 0.3–0.9)
Total Bilirubin: 0.6 mg/dL (ref 0.3–1.2)
Total Protein: 6.9 g/dL (ref 6.5–8.1)

## 2020-02-25 LAB — IRON AND TIBC
Iron: 20 ug/dL — ABNORMAL LOW (ref 28–170)
Saturation Ratios: 3 % — ABNORMAL LOW (ref 10.4–31.8)
TIBC: 628 ug/dL — ABNORMAL HIGH (ref 250–450)
UIBC: 608 ug/dL

## 2020-02-25 LAB — FOLATE: Folate: 14.3 ng/mL (ref >5.9)

## 2020-02-25 LAB — VITAMIN B12: Vitamin B-12: 2600 pg/mL — ABNORMAL HIGH (ref 180–914)

## 2020-02-25 LAB — FERRITIN: Ferritin: 6 ng/mL — ABNORMAL LOW (ref 11–307)

## 2020-02-25 LAB — SARS CORONAVIRUS 2 BY RT PCR (HOSPITAL ORDER, PERFORMED IN ~~LOC~~ HOSPITAL LAB): SARS Coronavirus 2: POSITIVE — AB

## 2020-02-25 SURGERY — ESOPHAGOGASTRODUODENOSCOPY (EGD) WITH PROPOFOL
Anesthesia: General

## 2020-02-25 NOTE — Anesthesia Preprocedure Evaluation (Signed)
Anesthesia Evaluation  Patient identified by MRN, date of birth, ID band Patient awake    Reviewed: Allergy & Precautions, H&P , NPO status , Patient's Chart, lab work & pertinent test results, reviewed documented beta blocker date and time   Airway Mallampati: II  TM Distance: >3 FB Neck ROM: full    Dental no notable dental hx.    Pulmonary shortness of breath, sleep apnea , pneumonia, resolved,    Pulmonary exam normal breath sounds clear to auscultation       Cardiovascular Exercise Tolerance: Good hypertension, negative cardio ROS   Rhythm:regular Rate:Normal     Neuro/Psych  Headaches, PSYCHIATRIC DISORDERS Depression Bipolar Disorder    GI/Hepatic Neg liver ROS, GERD  Medicated,  Endo/Other  negative endocrine ROS  Renal/GU negative Renal ROS  negative genitourinary   Musculoskeletal   Abdominal   Peds  Hematology  (+) Blood dyscrasia, anemia ,   Anesthesia Other Findings   Reproductive/Obstetrics negative OB ROS                             Anesthesia Physical  Anesthesia Plan  ASA: III  Anesthesia Plan: General   Post-op Pain Management:    Induction:   PONV Risk Score and Plan: Propofol infusion and TIVA  Airway Management Planned:   Additional Equipment:   Intra-op Plan:   Post-operative Plan:   Informed Consent: I have reviewed the patients History and Physical, chart, labs and discussed the procedure including the risks, benefits and alternatives for the proposed anesthesia with the patient or authorized representative who has indicated his/her understanding and acceptance.     Dental Advisory Given  Plan Discussed with: CRNA  Anesthesia Plan Comments:         Anesthesia Quick Evaluation  

## 2020-02-26 ENCOUNTER — Telehealth (INDEPENDENT_AMBULATORY_CARE_PROVIDER_SITE_OTHER): Payer: Self-pay | Admitting: Internal Medicine

## 2020-02-26 ENCOUNTER — Other Ambulatory Visit (INDEPENDENT_AMBULATORY_CARE_PROVIDER_SITE_OTHER): Payer: Self-pay | Admitting: Internal Medicine

## 2020-02-26 MED ORDER — FERROUS SULFATE 325 (65 FE) MG PO TABS: 325.0000 mg | ORAL_TABLET | Freq: Two times a day (BID) | ORAL | 0 refills | Status: AC

## 2020-02-26 NOTE — Telephone Encounter (Signed)
Patient was called this morning regarding her lab studies LFTs are normal. B12 and folate levels are normal Iron studies confirm iron deficiency anemia. Patient advised to take ferrous sulfate 325 mg p.o. twice daily. Will arrange for esophagogastroduodenoscopy and colonoscopy in 4 weeks with propofol. Forward copy of this blood work to PCP.

## 2020-02-27 NOTE — Telephone Encounter (Signed)
Noted, thank you

## 2020-03-02 ENCOUNTER — Encounter (INDEPENDENT_AMBULATORY_CARE_PROVIDER_SITE_OTHER): Payer: Self-pay

## 2020-03-02 ENCOUNTER — Telehealth (INDEPENDENT_AMBULATORY_CARE_PROVIDER_SITE_OTHER): Payer: Self-pay

## 2020-03-02 ENCOUNTER — Other Ambulatory Visit (INDEPENDENT_AMBULATORY_CARE_PROVIDER_SITE_OTHER): Payer: Self-pay

## 2020-03-02 ENCOUNTER — Ambulatory Visit (INDEPENDENT_AMBULATORY_CARE_PROVIDER_SITE_OTHER): Payer: Medicare Other | Admitting: Internal Medicine

## 2020-03-02 DIAGNOSIS — Z1211 Encounter for screening for malignant neoplasm of colon: Secondary | ICD-10-CM

## 2020-03-02 MED ORDER — NA SULFATE-K SULFATE-MG SULF 17.5-3.13-1.6 GM/177ML PO SOLN: 354.0000 mL | Freq: Once | ORAL | 0 refills | Status: AC

## 2020-03-02 NOTE — Telephone Encounter (Signed)
Lindsay Palmer, CMA  

## 2020-03-03 ENCOUNTER — Encounter (INDEPENDENT_AMBULATORY_CARE_PROVIDER_SITE_OTHER): Payer: Self-pay

## 2020-03-08 ENCOUNTER — Encounter (INDEPENDENT_AMBULATORY_CARE_PROVIDER_SITE_OTHER): Payer: Self-pay

## 2020-03-09 ENCOUNTER — Other Ambulatory Visit (INDEPENDENT_AMBULATORY_CARE_PROVIDER_SITE_OTHER): Payer: Self-pay | Admitting: Gastroenterology

## 2020-03-10 ENCOUNTER — Telehealth (INDEPENDENT_AMBULATORY_CARE_PROVIDER_SITE_OTHER): Payer: Self-pay

## 2020-03-10 MED ORDER — NA SULFATE-K SULFATE-MG SULF 17.5-3.13-1.6 GM/177ML PO SOLN: 354.0000 mL | Freq: Once | ORAL | 0 refills | Status: AC

## 2020-03-10 NOTE — Telephone Encounter (Signed)
Lindsay Palmer, CMA  

## 2020-03-11 ENCOUNTER — Telehealth (INDEPENDENT_AMBULATORY_CARE_PROVIDER_SITE_OTHER): Payer: Self-pay

## 2020-03-11 DIAGNOSIS — Z1211 Encounter for screening for malignant neoplasm of colon: Secondary | ICD-10-CM

## 2020-03-11 MED ORDER — PEG 3350-KCL-NA BICARB-NACL 420 G PO SOLR: 4000.0000 mL | ORAL | 0 refills | Status: DC

## 2020-03-11 NOTE — Telephone Encounter (Signed)
LeighAnn Linville Decarolis, CMA  

## 2020-03-16 NOTE — Patient Instructions (Signed)
Torina Ey  03/16/2020     @PREFPERIOPPHARMACY @   Your procedure is scheduled on  03/24/2020.   Report to 03/26/2020 at  0730  A.M.   Call this number if you have problems the morning of surgery:  [phone]   Remember:  Follow the diet and prep instructions given to you by the office.                 Take these medicines the morning of surgery with A SIP OF WATER   Abilify, carvedilol, lamictal, mobic(if needed), zofran (if needed).    Please brush your teeth.  Do not wear jewelry, make-up or nail polish.  Do not wear lotions, powders, or perfumes, or deodorant.  Do not shave 48 hours prior to surgery.  Men may shave face and neck.  Do not bring valuables to the hospital.  [facility] is not responsible for any belongings or valuables.  Contacts, dentures or bridgework may not be worn into surgery.  Leave your suitcase in the car.  After surgery it may be brought to your room.  For patients admitted to the hospital, discharge time will be determined by your treatment team.  Patients discharged the day of surgery will not be allowed to drive home and must have someone with them for 24 hours.   Special instructions:   DO NOT smoke tobacco or vape the morning of your procedure.   Please read over the following fact sheets that you were given. Anesthesia Post-op Instructions and Care and Recovery After Surgery       Upper Endoscopy, Adult, Care After This sheet gives you information about how to care for yourself after your procedure. Your health care provider may also give you more specific instructions. If you have problems or questions, contact your health care provider. What can I expect after the procedure? After the procedure, it is common to have:  A sore throat.  Mild stomach pain or discomfort.  Bloating.  Nausea. Follow these instructions at home:  Follow instructions from your health care provider about what to eat or drink after your  procedure.  Return to your normal activities as told by your health care provider. Ask your health care provider what activities are safe for you.  Take over-the-counter and prescription medicines only as told by your health care provider.  If you were given a sedative during the procedure, it can affect you for several hours. Do not drive or operate machinery until your health care provider says that it is safe.  Keep all follow-up visits as told by your health care provider. This is important.   Contact a health care provider if you have:  A sore throat that lasts longer than one day.  Trouble swallowing. Get help right away if:  You vomit blood or your vomit looks like coffee grounds.  You have: ? A fever. ? Bloody, black, or tarry stools. ? A severe sore throat or you cannot swallow. ? Difficulty breathing. ? Severe pain in your chest or abdomen. Summary  After the procedure, it is common to have a sore throat, mild stomach discomfort, bloating, and nausea.  If you were given a sedative during the procedure, it can affect you for several hours. Do not drive or operate machinery until your health care provider says that it is safe.  Follow instructions from your health care provider about what to eat or drink after your procedure.  Return to  your normal activities as told by your health care provider. This information is not intended to replace advice given to you by your health care provider. Make sure you discuss any questions you have with your health care provider. Document Revised: 01/14/2019 Document Reviewed: 06/18/2017 Elsevier Patient Education  2021 [employer]  Colonoscopy, Adult, Care After This sheet gives you information about how to care for yourself after your procedure. Your health care provider may also give you more specific instructions. If you have problems or questions, contact your health care provider. What can I expect after the procedure? After  the procedure, it is common to have:  A small amount of blood in your stool for 24 hours after the procedure.  Some gas.  Mild cramping or bloating of your abdomen. Follow these instructions at home: Eating and drinking  Drink enough fluid to keep your urine pale yellow.  Follow instructions from your health care provider about eating or drinking restrictions.  Resume your normal diet as instructed by your health care provider. Avoid heavy or fried foods that are hard to digest.   Activity  Rest as told by your health care provider.  Avoid sitting for a long time without moving. Get up to take short walks every 1-2 hours. This is important to improve blood flow and breathing. Ask for help if you feel weak or unsteady.  Return to your normal activities as told by your health care provider. Ask your health care provider what activities are safe for you. Managing cramping and bloating  Try walking around when you have cramps or feel bloated.  Apply heat to your abdomen as told by your health care provider. Use the heat source that your health care provider recommends, such as a moist heat pack or a heating pad. ? Place a towel between your skin and the heat source. ? Leave the heat on for 20-30 minutes. ? Remove the heat if your skin turns bright red. This is especially important if you are unable to feel pain, heat, or cold. You may have a greater risk of getting burned.   General instructions  If you were given a sedative during the procedure, it can affect you for several hours. Do not drive or operate machinery until your health care provider says that it is safe.  For the first 24 hours after the procedure: ? Do not sign important documents. ? Do not drink alcohol. ? Do your regular daily activities at a slower pace than normal. ? Eat soft foods that are easy to digest.  Take over-the-counter and prescription medicines only as told by your health care provider.  Keep all  follow-up visits as told by your health care provider. This is important. Contact a health care provider if:  You have blood in your stool 2-3 days after the procedure. Get help right away if you have:  More than a small spotting of blood in your stool.  Large blood clots in your stool.  Swelling of your abdomen.  Nausea or vomiting.  A fever.  Increasing pain in your abdomen that is not relieved with medicine. Summary  After the procedure, it is common to have a small amount of blood in your stool. You may also have mild cramping and bloating of your abdomen.  If you were given a sedative during the procedure, it can affect you for several hours. Do not drive or operate machinery until your health care provider says that it is safe.  Get help  right away if you have a lot of blood in your stool, nausea or vomiting, a fever, or increased pain in your abdomen. This information is not intended to replace advice given to you by your health care provider. Make sure you discuss any questions you have with your health care provider. Document Revised: 01/10/2019 Document Reviewed: 08/12/2018 Elsevier Patient Education  2021 [employer] Monitored Anesthesia Care, Care After This sheet gives you information about how to care for yourself after your procedure. Your health care provider may also give you more specific instructions. If you have problems or questions, contact your health care provider. What can I expect after the procedure? After the procedure, it is common to have:  Tiredness.  Forgetfulness about what happened after the procedure.  Impaired judgment for important decisions.  Nausea or vomiting.  Some difficulty with balance. Follow these instructions at home: For the time period you were told by your health care provider:  Rest as needed.  Do not participate in activities where you could fall or become injured.  Do not drive or use machinery.  Do not drink  alcohol.  Do not take sleeping pills or medicines that cause drowsiness.  Do not make important decisions or sign legal documents.  Do not take care of children on your own.      Eating and drinking  Follow the diet that is recommended by your health care provider.  Drink enough fluid to keep your urine pale yellow.  If you vomit: ? Drink water, juice, or soup when you can drink without vomiting. ? Make sure you have little or no nausea before eating solid foods. General instructions  Have a responsible adult stay with you for the time you are told. It is important to have someone help care for you until you are awake and alert.  Take over-the-counter and prescription medicines only as told by your health care provider.  If you have sleep apnea, surgery and certain medicines can increase your risk for breathing problems. Follow instructions from your health care provider about wearing your sleep device: ? Anytime you are sleeping, including during daytime naps. ? While taking prescription pain medicines, sleeping medicines, or medicines that make you drowsy.  Avoid smoking.  Keep all follow-up visits as told by your health care provider. This is important. Contact a health care provider if:  You keep feeling nauseous or you keep vomiting.  You feel light-headed.  You are still sleepy or having trouble with balance after 24 hours.  You develop a rash.  You have a fever.  You have redness or swelling around the IV site. Get help right away if:  You have trouble breathing.  You have new-onset confusion at home. Summary  For several hours after your procedure, you may feel tired. You may also be forgetful and have poor judgment.  Have a responsible adult stay with you for the time you are told. It is important to have someone help care for you until you are awake and alert.  Rest as told. Do not drive or operate machinery. Do not drink alcohol or take sleeping  pills.  Get help right away if you have trouble breathing, or if you suddenly become confused. This information is not intended to replace advice given to you by your health care provider. Make sure you discuss any questions you have with your health care provider. Document Revised: 10/02/2019 Document Reviewed: 12/19/2018 Elsevier Patient Education  2021 [employer].

## 2020-03-18 ENCOUNTER — Other Ambulatory Visit: Payer: Self-pay

## 2020-03-18 ENCOUNTER — Encounter (HOSPITAL_COMMUNITY)
Admission: RE | Admit: 2020-03-18 | Discharge: 2020-03-18 | Disposition: A | Discharge status: Home / Self Care | Payer: Medicare Other | Source: Ambulatory Visit | Attending: Internal Medicine | Admitting: Internal Medicine

## 2020-03-22 ENCOUNTER — Encounter (HOSPITAL_COMMUNITY): Payer: Self-pay

## 2020-03-22 ENCOUNTER — Other Ambulatory Visit: Payer: Self-pay

## 2020-03-22 ENCOUNTER — Other Ambulatory Visit (INDEPENDENT_AMBULATORY_CARE_PROVIDER_SITE_OTHER): Payer: Self-pay

## 2020-03-22 ENCOUNTER — Other Ambulatory Visit (HOSPITAL_COMMUNITY)
Admission: RE | Admit: 2020-03-22 | Discharge: 2020-03-22 | Disposition: A | Discharge status: Home / Self Care | Payer: Medicare Other | Source: Ambulatory Visit | Attending: Internal Medicine | Admitting: Internal Medicine

## 2020-03-22 DIAGNOSIS — Z1211 Encounter for screening for malignant neoplasm of colon: Secondary | ICD-10-CM

## 2020-03-24 ENCOUNTER — Ambulatory Visit (HOSPITAL_COMMUNITY)
Admission: RE | Admit: 2020-03-24 | Discharge: 2020-03-24 | Disposition: A | Discharge status: Home / Self Care | Payer: Medicare Other | Source: Ambulatory Visit | Attending: Internal Medicine | Admitting: Internal Medicine

## 2020-03-24 ENCOUNTER — Encounter (HOSPITAL_COMMUNITY)
Admission: RE | Disposition: A | Discharge status: Home / Self Care | Payer: Self-pay | Source: Ambulatory Visit | Attending: Internal Medicine

## 2020-03-24 ENCOUNTER — Encounter (INDEPENDENT_AMBULATORY_CARE_PROVIDER_SITE_OTHER): Payer: Self-pay | Admitting: *Deleted

## 2020-03-24 ENCOUNTER — Other Ambulatory Visit: Payer: Self-pay

## 2020-03-24 ENCOUNTER — Ambulatory Visit (HOSPITAL_COMMUNITY): Payer: Medicare Other | Admitting: Anesthesiology

## 2020-03-24 ENCOUNTER — Encounter (HOSPITAL_COMMUNITY): Payer: Self-pay | Admitting: Internal Medicine

## 2020-03-24 DIAGNOSIS — K5730 Diverticulosis of large intestine without perforation or abscess without bleeding: Secondary | ICD-10-CM | POA: Insufficient documentation

## 2020-03-24 DIAGNOSIS — R1030 Lower abdominal pain, unspecified: Secondary | ICD-10-CM | POA: Insufficient documentation

## 2020-03-24 DIAGNOSIS — Z791 Long term (current) use of non-steroidal anti-inflammatories (NSAID): Secondary | ICD-10-CM | POA: Insufficient documentation

## 2020-03-24 DIAGNOSIS — Z980 Intestinal bypass and anastomosis status: Secondary | ICD-10-CM | POA: Insufficient documentation

## 2020-03-24 DIAGNOSIS — K319 Disease of stomach and duodenum, unspecified: Secondary | ICD-10-CM | POA: Insufficient documentation

## 2020-03-24 DIAGNOSIS — D509 Iron deficiency anemia, unspecified: Secondary | ICD-10-CM | POA: Insufficient documentation

## 2020-03-24 DIAGNOSIS — E7800 Pure hypercholesterolemia, unspecified: Secondary | ICD-10-CM | POA: Insufficient documentation

## 2020-03-24 DIAGNOSIS — K644 Residual hemorrhoidal skin tags: Secondary | ICD-10-CM | POA: Insufficient documentation

## 2020-03-24 DIAGNOSIS — K2950 Unspecified chronic gastritis without bleeding: Secondary | ICD-10-CM | POA: Insufficient documentation

## 2020-03-24 DIAGNOSIS — I509 Heart failure, unspecified: Secondary | ICD-10-CM | POA: Diagnosis not present

## 2020-03-24 DIAGNOSIS — R110 Nausea: Secondary | ICD-10-CM | POA: Diagnosis not present

## 2020-03-24 DIAGNOSIS — K449 Diaphragmatic hernia without obstruction or gangrene: Secondary | ICD-10-CM | POA: Diagnosis not present

## 2020-03-24 DIAGNOSIS — K219 Gastro-esophageal reflux disease without esophagitis: Secondary | ICD-10-CM

## 2020-03-24 DIAGNOSIS — Z9049 Acquired absence of other specified parts of digestive tract: Secondary | ICD-10-CM | POA: Diagnosis not present

## 2020-03-24 DIAGNOSIS — Z881 Allergy status to other antibiotic agents status: Secondary | ICD-10-CM | POA: Diagnosis not present

## 2020-03-24 DIAGNOSIS — R1033 Periumbilical pain: Secondary | ICD-10-CM

## 2020-03-24 DIAGNOSIS — Z1211 Encounter for screening for malignant neoplasm of colon: Secondary | ICD-10-CM

## 2020-03-24 DIAGNOSIS — K760 Fatty (change of) liver, not elsewhere classified: Secondary | ICD-10-CM | POA: Diagnosis not present

## 2020-03-24 DIAGNOSIS — I110 Hypertensive heart disease with heart failure: Secondary | ICD-10-CM | POA: Insufficient documentation

## 2020-03-24 DIAGNOSIS — K2970 Gastritis, unspecified, without bleeding: Secondary | ICD-10-CM

## 2020-03-24 DIAGNOSIS — Z79899 Other long term (current) drug therapy: Secondary | ICD-10-CM | POA: Diagnosis not present

## 2020-03-24 DIAGNOSIS — Z885 Allergy status to narcotic agent status: Secondary | ICD-10-CM | POA: Diagnosis not present

## 2020-03-24 HISTORY — PX: ESOPHAGOGASTRODUODENOSCOPY (EGD) WITH PROPOFOL: SHX5813

## 2020-03-24 HISTORY — PX: COLONOSCOPY WITH PROPOFOL: SHX5780

## 2020-03-24 HISTORY — PX: BIOPSY: SHX5522

## 2020-03-24 LAB — CBC
HCT: 37.6 % (ref 36.0–46.0)
Hemoglobin: 11.1 g/dL — ABNORMAL LOW (ref 12.0–15.0)
MCH: 24.9 pg — ABNORMAL LOW (ref 26.0–34.0)
MCHC: 29.5 g/dL — ABNORMAL LOW (ref 30.0–36.0)
MCV: 84.5 fL (ref 80.0–100.0)
Platelets: 214 10*3/uL (ref 150–400)
RBC: 4.45 MIL/uL (ref 3.87–5.11)
RDW: 21.6 % — ABNORMAL HIGH (ref 11.5–15.5)
WBC: 4.7 10*3/uL (ref 4.0–10.5)
nRBC: 0 % (ref 0.0–0.2)

## 2020-03-24 LAB — HM COLONOSCOPY

## 2020-03-24 SURGERY — COLONOSCOPY WITH PROPOFOL
Anesthesia: General

## 2020-03-24 MED ORDER — STERILE WATER FOR IRRIGATION IR SOLN
Status: DC | PRN
  Administered 2020-03-24: 1.5 mL

## 2020-03-24 MED ORDER — CHLORHEXIDINE GLUCONATE CLOTH 2 % EX PADS: 6.0000 | MEDICATED_PAD | Freq: Once | CUTANEOUS | Status: DC

## 2020-03-24 MED ORDER — LACTATED RINGERS IV SOLN: INTRAVENOUS | Status: DC

## 2020-03-24 MED ORDER — PROPOFOL 500 MG/50ML IV EMUL
INTRAVENOUS | Status: DC | PRN
  Administered 2020-03-24: 75 ug/kg/min via INTRAVENOUS

## 2020-03-24 MED ORDER — PROPOFOL 10 MG/ML IV BOLUS
INTRAVENOUS | Status: DC | PRN
  Administered 2020-03-24 (×4): 50 mg via INTRAVENOUS

## 2020-03-24 NOTE — Transfer of Care (Signed)
Immediate Anesthesia Transfer of Care Note  Patient: Lindsay Palmer  Procedure(s) Performed: COLONOSCOPY WITH PROPOFOL (N/A ) ESOPHAGOGASTRODUODENOSCOPY (EGD) WITH PROPOFOL (N/A ) BIOPSY  Patient Location: Short Stay  Anesthesia Type:General  Level of Consciousness: awake, alert  and patient cooperative  Airway & Oxygen Therapy: Patient Spontanous Breathing  Post-op Assessment: Report given to RN and Post -op Vital signs reviewed and stable  Post vital signs: Reviewed and stable  Last Vitals:  Vitals Value Taken Time  BP    Temp    Pulse    Resp    SpO2     See vital sign flow sheetLast Pain:  Vitals:   03/24/20 0906  TempSrc:   PainSc: 0-No pain         Complications: No complications documented.

## 2020-03-24 NOTE — Op Note (Signed)
[facility] Patient Name: Lindsay LinerHelen Palmer Procedure Date: 03/24/2020 8:56 AM MRN: [id number] Date of Birth: 1940/07/23 Attending MD: Lindsay DecemberNajeeb Gwendlyon Palmer , MD CSN: [id number] Age: [age] Admit Type: Outpatient Procedure:                Upper GI endoscopy Indications:              Iron deficiency anemia, Nausea Providers:                Lindsay DecemberNajeeb Elanda Garmany, MD, Angelica RanSusan Goss, Burke Keelsrisann Tilley,                            Technician Referring MD:             Angelique Holmianne E. Elliot, NP Medicines:                Propofol per Anesthesia Complications:            No immediate complications. Estimated Blood Loss:     Estimated blood loss was minimal. Procedure:                Pre-Anesthesia Assessment:                           - Prior to the procedure, a History and Physical                            was performed, and patient medications and                            allergies were reviewed. The patient's tolerance of                            previous anesthesia was also reviewed. The risks                            and benefits of the procedure and the sedation                            options and risks were discussed with the patient.                            All questions were answered, and informed consent                            was obtained. Prior Anticoagulants: The patient has                            taken no previous anticoagulant or antiplatelet                            agents except for NSAID medication. ASA Grade                            Assessment: III - A patient with severe systemic  disease. After reviewing the risks and benefits,                            the patient was deemed in satisfactory condition to                            undergo the procedure.                           After obtaining informed consent, the endoscope was                            passed under direct vision. Throughout the                            procedure, the patient's  blood pressure, pulse, and                            oxygen saturations were monitored continuously. The                            GIF-H190 ([id number]) scope was introduced through the                            mouth, and advanced to the second part of duodenum.                            The upper GI endoscopy was accomplished without                            difficulty. The patient tolerated the procedure                            well. Scope In: 9:14:17 AM Scope Out: 9:22:49 AM Total Procedure Duration: 0 hours 8 minutes 32 seconds  Findings:      The hypopharynx was normal.      The examined esophagus was normal.      The Z-line was regular and was found 37 cm from the incisors.      A 3 cm hiatal hernia was present.      Patchy mild inflammation characterized by congestion (edema) and       erythema was found in the gastric antrum. Biopsies were taken with a       cold forceps for histology. The pathology specimen was placed into       Bottle Number 1.      The exam of the stomach was otherwise normal.      The duodenal bulb and second portion of the duodenum were normal.       Biopsies for histology were taken with a cold forceps for evaluation of       celiac disease. The pathology specimen was placed into Bottle Number 2. Impression:               - Normal hypopharynx.                           -  Normal esophagus.                           - Z-line regular, 37 cm from the incisors.                           - 3 cm hiatal hernia.                           - Gastritis. Biopsied.                           - Normal duodenal bulb and second portion of the                            duodenum. Biopsied. Moderate Sedation:      Per Anesthesia Care Recommendation:           - Patient has a contact number available for                            emergencies. The signs and symptoms of potential                            delayed complications were discussed with the                             patient. Return to normal activities tomorrow.                            Written discharge instructions were provided to the                            patient.                           - Resume previous diet today.                           - Continue present medications.                           - No aspirin, ibuprofen, naproxen, or other                            non-steroidal anti-inflammatory drugs for 1 day.                           - Await pathology results. Procedure Code(s):        --- Professional ---                           [phone], Esophagogastroduodenoscopy, flexible,                            transoral; with biopsy, single or multiple Diagnosis Code(s):        --- Professional ---  K44.9, Diaphragmatic hernia without obstruction or                            gangrene                           K29.70, Gastritis, unspecified, without bleeding                           D50.9, Iron deficiency anemia, unspecified                           R11.0, Nausea CPT copyright 2019 American Medical Association. All rights reserved. The codes documented in this report are preliminary and upon coder review may  be revised to meet current compliance requirements. Lindsay December, MD Lindsay December, MD 03/24/2020 9:51:05 AM This report has been signed electronically. Number of Addenda: 0

## 2020-03-24 NOTE — Anesthesia Postprocedure Evaluation (Signed)
Anesthesia Post Note  Patient: Lindsay Palmer  Procedure(s) Performed: COLONOSCOPY WITH PROPOFOL (N/A ) ESOPHAGOGASTRODUODENOSCOPY (EGD) WITH PROPOFOL (N/A ) BIOPSY  Patient location during evaluation: Short Stay Anesthesia Type: General Level of consciousness: awake and alert and patient cooperative Pain management: satisfactory to patient Vital Signs Assessment: post-procedure vital signs reviewed and stable Respiratory status: spontaneous breathing and patient connected to nasal cannula oxygen Cardiovascular status: stable Postop Assessment: no apparent nausea or vomiting Anesthetic complications: no   No complications documented.   Last Vitals:  Vitals:   03/24/20 0820 03/24/20 0948  BP: (!) 149/66 (!) 134/46  Pulse: 93   Resp: 13 14  Temp: 36.7 C 36.5 C  SpO2: 100% 100%    Last Pain:  Vitals:   03/24/20 0948  TempSrc: Oral  PainSc: 0-No pain                 Arlet Marter

## 2020-03-24 NOTE — Anesthesia Preprocedure Evaluation (Signed)
Anesthesia Evaluation  Patient identified by MRN, date of birth, ID band Patient awake    Reviewed: Allergy & Precautions, H&P , NPO status , Patient's Chart, lab work & pertinent test results, reviewed documented beta blocker date and time   Airway Mallampati: II  TM Distance: >3 FB Neck ROM: full    Dental no notable dental hx.    Pulmonary shortness of breath, sleep apnea , pneumonia, resolved,    Pulmonary exam normal breath sounds clear to auscultation       Cardiovascular Exercise Tolerance: Good hypertension, negative cardio ROS   Rhythm:regular Rate:Normal     Neuro/Psych  Headaches, PSYCHIATRIC DISORDERS Depression Bipolar Disorder    GI/Hepatic Neg liver ROS, GERD  Medicated,  Endo/Other  negative endocrine ROS  Renal/GU negative Renal ROS  negative genitourinary   Musculoskeletal   Abdominal   Peds  Hematology  (+) Blood dyscrasia, anemia ,   Anesthesia Other Findings   Reproductive/Obstetrics negative OB ROS                             Anesthesia Physical  Anesthesia Plan  ASA: III  Anesthesia Plan: General   Post-op Pain Management:    Induction:   PONV Risk Score and Plan: Propofol infusion and TIVA  Airway Management Planned:   Additional Equipment:   Intra-op Plan:   Post-operative Plan:   Informed Consent: I have reviewed the patients History and Physical, chart, labs and discussed the procedure including the risks, benefits and alternatives for the proposed anesthesia with the patient or authorized representative who has indicated his/her understanding and acceptance.     Dental Advisory Given  Plan Discussed with: CRNA  Anesthesia Plan Comments:         Anesthesia Quick Evaluation

## 2020-03-24 NOTE — Anesthesia Procedure Notes (Signed)
Date/Time: 03/24/2020 9:05 AM Performed by: Franco Nones, CRNA Pre-anesthesia Checklist: Patient identified, Emergency Drugs available, Suction available, Timeout performed and Patient being monitored Patient Re-evaluated:Patient Re-evaluated prior to induction Oxygen Delivery Method: Nasal Cannula

## 2020-03-24 NOTE — Op Note (Signed)
[facility] Patient Name: Lindsay LinerHelen Palmer Procedure Date: 03/24/2020 9:24 AM MRN: [id number] Date of Birth: 03/13/1940 Attending MD: Lionel DecemberNajeeb Armine Rizzolo , MD CSN: [id number] Age: [age] Admit Type: Outpatient Procedure:                Colonoscopy Indications:              Iron deficiency anemia Providers:                Lionel DecemberNajeeb Anessia [place], MD, Angelica RanSusan Goss, Burke Keelsrisann Tilley,                            Technician Referring MD:             Angelique Holmianne E. Elliot, NP Medicines:                Propofol per Anesthesia Complications:            No immediate complications. Estimated Blood Loss:     Estimated blood loss: none. Procedure:                Pre-Anesthesia Assessment:                           - Prior to the procedure, a History and Physical                            was performed, and patient medications and                            allergies were reviewed. The patient's tolerance of                            previous anesthesia was also reviewed. The risks                            and benefits of the procedure and the sedation                            options and risks were discussed with the patient.                            All questions were answered, and informed consent                            was obtained. Prior Anticoagulants: The patient has                            taken no previous anticoagulant or antiplatelet                            agents except for NSAID medication. ASA Grade                            Assessment: III - A patient with severe systemic  disease. After reviewing the risks and benefits,                            the patient was deemed in satisfactory condition to                            undergo the procedure.                           After obtaining informed consent, the colonoscope                            was passed under direct vision. Throughout the                            procedure, the patient's blood pressure,  pulse, and                            oxygen saturations were monitored continuously. The                            PCF-H190DL ([id number] scope was introduced through                            the anus and advanced to the the cecum, identified                            by appendiceal orifice and ileocecal valve. The                            colonoscopy was performed without difficulty. The                            patient tolerated the procedure well. The quality                            of the bowel preparation was adequate. The                            ileocecal valve, appendiceal orifice, and rectum                            were photographed. Scope In: 9:27:26 AM Scope Out: 9:40:11 AM Scope Withdrawal Time: 0 hours 9 minutes 8 seconds  Total Procedure Duration: 0 hours 12 minutes 45 seconds  Findings:      The perianal and digital rectal examinations were normal.      A single diverticulum was found in the proximal sigmoid colon.      There was evidence of a prior end-to-end colo-colonic anastomosis in the       distal sigmoid colon. This was patent.      External hemorrhoids were found during retroflexion. The hemorrhoids       were medium-sized. Impression:               - Diverticulosis in the proximal sigmoid colon.                           -  Patent end-to-end colo-colonic anastomosis.                           - External hemorrhoids.                           - No specimens collected. Moderate Sedation:      Per Anesthesia Care Recommendation:           - Patient has a contact number available for                            emergencies. The signs and symptoms of potential                            delayed complications were discussed with the                            patient. Return to normal activities tomorrow.                            Written discharge instructions were provided to the                            patient.                           - High  fiber diet today.                           - Continue present medications.                           - No repeat colonoscopy due to age and the absence                            of advanced adenomas.                           - Check CBC today. Procedure Code(s):        --- Professional ---                           [phone], Colonoscopy, flexible; diagnostic, including                            collection of specimen(s) by brushing or washing,                            when performed (separate procedure) Diagnosis Code(s):        --- Professional ---                           K64.4, Residual hemorrhoidal skin tags                           Z98.0, Intestinal bypass and anastomosis status  D50.9, Iron deficiency anemia, unspecified                           K57.30, Diverticulosis of large intestine without                            perforation or abscess without bleeding CPT copyright 2019 [employer]. All rights reserved. The codes documented in this report are preliminary and upon coder review may  be revised to meet current compliance requirements. Lionel December, MD Lionel December, MD 03/24/2020 9:55:49 AM This report has been signed electronically. Number of Addenda: 0

## 2020-03-24 NOTE — Discharge Instructions (Signed)
Resume meloxicam on 03/25/2020 Resume other medications as before High-fiber diet. No driving for 24 hours. Physician will call with results of blood work and biopsy.      Colonoscopy, Adult, Care After This sheet gives you information about how to care for yourself after your procedure. Your doctor may also give you more specific instructions. If you have problems or questions, call your doctor. What can I expect after the procedure? After the procedure, it is common to have:  A small amount of blood in your poop (stool) for 24 hours.  Some gas.  Mild cramping or bloating in your belly (abdomen). Follow these instructions at home: Eating and drinking  Drink enough fluid to keep your pee (urine) pale yellow.  Follow instructions from your doctor about what you cannot eat or drink.  Return to your normal diet as told by your doctor. Avoid heavy or fried foods that are hard to digest.   Activity  Rest as told by your doctor.  Do not sit for a long time without moving. Get up to take short walks every 1-2 hours. This is important. Ask for help if you feel weak or unsteady.  Return to your normal activities as told by your doctor. Ask your doctor what activities are safe for you. To help cramping and bloating:  Try walking around.  Put heat on your belly as told by your doctor. Use the heat source that your doctor recommends, such as a moist heat pack or a heating pad. ? Put a towel between your skin and the heat source. ? Leave the heat on for 20-30 minutes. ? Remove the heat if your skin turns bright red. This is very important if you are unable to feel pain, heat, or cold. You may have a greater risk of getting burned.   General instructions  If you were given a medicine to help you relax (sedative) during your procedure, it can affect you for many hours. Do not drive or use machinery until your doctor says that it is safe.  For the first 24 hours after the  procedure: ? Do not sign important documents. ? Do not drink alcohol. ? Do your daily activities more slowly than normal. ? Eat foods that are soft and easy to digest.  Take over-the-counter or prescription medicines only as told by your doctor.  Keep all follow-up visits as told by your doctor. This is important. Contact a doctor if:  You have blood in your poop 2-3 days after the procedure. Get help right away if:  You have more than a small amount of blood in your poop.  You see large clumps of tissue (blood clots) in your poop.  Your belly is swollen.  You feel like you may vomit (nauseous).  You vomit.  You have a fever.  You have belly pain that gets worse, and medicine does not help your pain. Summary  After the procedure, it is common to have a small amount of blood in your poop. You may also have mild cramping and bloating in your belly.  If you were given a medicine to help you relax (sedative) during your procedure, it can affect you for many hours. Do not drive or use machinery until your doctor says that it is safe.  Get help right away if you have a lot of blood in your poop, feel like you may vomit, have a fever, or have more belly pain. This information is not intended to replace advice given  to you by your health care provider. Make sure you discuss any questions you have with your health care provider. Document Revised: 11/22/2018 Document Reviewed: 08/12/2018 Elsevier Patient Education  2021 [employer]  Diverticulosis  Diverticulosis is a condition that develops when small pouches (diverticula) form in the wall of the large intestine (colon). The colon is where water is absorbed and stool (feces) is formed. The pouches form when the inside layer of the colon pushes through weak spots in the outer layers of the colon. You may have a few pouches or many of them. The pouches usually do not cause problems unless they become inflamed or infected. When this  happens, the condition is called diverticulitis. What are the causes? The cause of this condition is not known. What increases the risk? The following factors may make you more likely to develop this condition:  Being older than age 60. Your risk for this condition increases with age. Diverticulosis is rare among people younger than age 25. By age 61, many people have it.  Eating a low-fiber diet.  Having frequent constipation.  Being overweight.  Not getting enough exercise.  Smoking.  Taking over-the-counter pain medicines, like aspirin and ibuprofen.  Having a family history of diverticulosis. What are the signs or symptoms? In most people, there are no symptoms of this condition. If you do have symptoms, they may include:  Bloating.  Cramps in the abdomen.  Constipation or diarrhea.  Pain in the lower left side of the abdomen. How is this diagnosed? Because diverticulosis usually has no symptoms, it is most often diagnosed during an exam for other colon problems. The condition may be diagnosed by:  Using a flexible scope to examine the colon (colonoscopy).  Taking an X-ray of the colon after dye has been put into the colon (barium enema).  Having a CT scan. How is this treated? You may not need treatment for this condition. Your health care provider may recommend treatment to prevent problems. You may need treatment if you have symptoms or if you previously had diverticulitis. Treatment may include:  Eating a high-fiber diet.  Taking a fiber supplement.  Taking a live bacteria supplement (probiotic).  Taking medicine to relax your colon.   Follow these instructions at home: Medicines  Take over-the-counter and prescription medicines only as told by your health care provider.  If told by your health care provider, take a fiber supplement or probiotic. Constipation prevention Your condition may cause constipation. To prevent or treat constipation, you may  need to:  Drink enough fluid to keep your urine pale yellow.  Take over-the-counter or prescription medicines.  Eat foods that are high in fiber, such as beans, whole grains, and fresh fruits and vegetables.  Limit foods that are high in fat and processed sugars, such as fried or sweet foods.   General instructions  Try not to strain when you have a bowel movement.  Keep all follow-up visits as told by your health care provider. This is important. Contact a health care provider if you:  Have pain in your abdomen.  Have bloating.  Have cramps.  Have not had a bowel movement in 3 days. Get help right away if:  Your pain gets worse.  Your bloating becomes very bad.  You have a fever or chills, and your symptoms suddenly get worse.  You vomit.  You have bowel movements that are bloody or black.  You have bleeding from your rectum. Summary  Diverticulosis is a condition that  develops when small pouches (diverticula) form in the wall of the large intestine (colon).  You may have a few pouches or many of them.  This condition is most often diagnosed during an exam for other colon problems.  Treatment may include increasing the fiber in your diet, taking supplements, or taking medicines. This information is not intended to replace advice given to you by your health care provider. Make sure you discuss any questions you have with your health care provider. Document Revised: 08/15/2018 Document Reviewed: 08/15/2018 Elsevier Patient Education  2021 [employer]  Hemorrhoids Hemorrhoids are swollen veins in and around the rectum or anus. There are two types of hemorrhoids:  Internal hemorrhoids. These occur in the veins that are just inside the rectum. They may poke through to the outside and become irritated and painful.  External hemorrhoids. These occur in the veins that are outside the anus and can be felt as a painful swelling or hard lump near the anus. Most  hemorrhoids do not cause serious problems, and they can be managed with home treatments such as diet and lifestyle changes. If home treatments do not help the symptoms, procedures can be done to shrink or remove the hemorrhoids. What are the causes? This condition is caused by increased pressure in the anal area. This pressure may result from various things, including:  Constipation.  Straining to have a bowel movement.  Diarrhea.  Pregnancy.  Obesity.  Sitting for long periods of time.  Heavy lifting or other activity that causes you to strain.  Anal sex.  Riding a bike for a long period of time. What are the signs or symptoms? Symptoms of this condition include:  Pain.  Anal itching or irritation.  Rectal bleeding.  Leakage of stool (feces).  Anal swelling.  One or more lumps around the anus. How is this diagnosed? This condition can often be diagnosed through a visual exam. Other exams or tests may also be done, such as:  An exam that involves feeling the rectal area with a gloved hand (digital rectal exam).  An exam of the anal canal that is done using a small tube (anoscope).  A blood test, if you have lost a significant amount of blood.  A test to look inside the colon using a flexible tube with a camera on the end (sigmoidoscopy or colonoscopy). How is this treated? This condition can usually be treated at home. However, various procedures may be done if dietary changes, lifestyle changes, and other home treatments do not help your symptoms. These procedures can help make the hemorrhoids smaller or remove them completely. Some of these procedures involve surgery, and others do not. Common procedures include:  Rubber band ligation. Rubber bands are placed at the base of the hemorrhoids to cut off their blood supply.  Sclerotherapy. Medicine is injected into the hemorrhoids to shrink them.  Infrared coagulation. A type of light energy is used to get rid of the  hemorrhoids.  Hemorrhoidectomy surgery. The hemorrhoids are surgically removed, and the veins that supply them are tied off.  Stapled hemorrhoidopexy surgery. The surgeon staples the base of the hemorrhoid to the rectal wall. Follow these instructions at home: Eating and drinking  Eat foods that have a lot of fiber in them, such as whole grains, beans, nuts, fruits, and vegetables.  Ask your health care provider about taking products that have added fiber (fiber supplements).  Reduce the amount of fat in your diet. You can do this by eating low-fat  dairy products, eating less red meat, and avoiding processed foods.  Drink enough fluid to keep your urine pale yellow.   Managing pain and swelling  Take warm sitz baths for 20 minutes, 3-4 times a day to ease pain and discomfort. You may do this in a bathtub or using a portable sitz bath that fits over the toilet.  If directed, apply ice to the affected area. Using ice packs between sitz baths may be helpful. ? Put ice in a plastic bag. ? Place a towel between your skin and the bag. ? Leave the ice on for 20 minutes, 2-3 times a day.   General instructions  Take over-the-counter and prescription medicines only as told by your health care provider.  Use medicated creams or suppositories as told.  Get regular exercise. Ask your health care provider how much and what kind of exercise is best for you. In general, you should do moderate exercise for at least 30 minutes on most days of the week (150 minutes each week). This can include activities such as walking, biking, or yoga.  Go to the bathroom when you have the urge to have a bowel movement. Do not wait.  Avoid straining to have bowel movements.  Keep the anal area dry and clean. Use wet toilet paper or moist towelettes after a bowel movement.  Do not sit on the toilet for long periods of time. This increases blood pooling and pain.  Keep all follow-up visits as told by your health  care provider. This is important. Contact a health care provider if you have:  Increasing pain and swelling that are not controlled by treatment or medicine.  Difficulty having a bowel movement, or you are unable to have a bowel movement.  Pain or inflammation outside the area of the hemorrhoids. Get help right away if you have:  Uncontrolled bleeding from your rectum. Summary  Hemorrhoids are swollen veins in and around the rectum or anus.  Most hemorrhoids can be managed with home treatments such as diet and lifestyle changes.  Taking warm sitz baths can help ease pain and discomfort.  In severe cases, procedures or surgery can be done to shrink or remove the hemorrhoids. This information is not intended to replace advice given to you by your health care provider. Make sure you discuss any questions you have with your health care provider. Document Revised: 06/14/2018 Document Reviewed: 06/07/2017 Elsevier Patient Education  2021 [employer]   Upper Endoscopy, Adult, Care After This sheet gives you information about how to care for yourself after your procedure. Your health care provider may also give you more specific instructions. If you have problems or questions, contact your health care provider. What can I expect after the procedure? After the procedure, it is common to have:  A sore throat.  Mild stomach pain or discomfort.  Bloating.  Nausea. Follow these instructions at home:  Follow instructions from your health care provider about what to eat or drink after your procedure.  Return to your normal activities as told by your health care provider. Ask your health care provider what activities are safe for you.  Take over-the-counter and prescription medicines only as told by your health care provider.  If you were given a sedative during the procedure, it can affect you for several hours. Do not drive or operate machinery until your health care provider says  that it is safe.  Keep all follow-up visits as told by your health care provider. This is  important.   Contact a health care provider if you have:  A sore throat that lasts longer than one day.  Trouble swallowing. Get help right away if:  You vomit blood or your vomit looks like coffee grounds.  You have: ? A fever. ? Bloody, black, or tarry stools. ? A severe sore throat or you cannot swallow. ? Difficulty breathing. ? Severe pain in your chest or abdomen. Summary  After the procedure, it is common to have a sore throat, mild stomach discomfort, bloating, and nausea.  If you were given a sedative during the procedure, it can affect you for several hours. Do not drive or operate machinery until your health care provider says that it is safe.  Follow instructions from your health care provider about what to eat or drink after your procedure.  Return to your normal activities as told by your health care provider. This information is not intended to replace advice given to you by your health care provider. Make sure you discuss any questions you have with your health care provider. Document Revised: 01/14/2019 Document Reviewed: 06/18/2017 Elsevier Patient Education  2021 [employer]   Hiatal Hernia  A hiatal hernia occurs when part of the stomach slides above the muscle that separates the abdomen from the chest (diaphragm). A person can be born with a hiatal hernia (congenital), or it may develop over time. In almost all cases of hiatal hernia, only the top part of the stomach pushes through the diaphragm. Many people have a hiatal hernia with no symptoms. The larger the hernia, the more likely it is that you will have symptoms. In some cases, a hiatal hernia allows stomach acid to flow back into the tube that carries food from your mouth to your stomach (esophagus). This may cause heartburn symptoms. Severe heartburn symptoms may mean that you have developed a condition called  gastroesophageal reflux disease (GERD). What are the causes? This condition is caused by a weakness in the opening (hiatus) where the esophagus passes through the diaphragm to attach to the upper part of the stomach. A person may be born with a weakness in the hiatus, or a weakness can develop over time. What increases the risk? This condition is more likely to develop in:  Older people. Age is a major risk factor for a hiatal hernia, especially if you are over the age of 65.  Pregnant women.  People who are overweight.  People who have frequent constipation. What are the signs or symptoms? Symptoms of this condition usually develop in the form of GERD symptoms. Symptoms include:  Heartburn.  Belching.  Indigestion.  Trouble swallowing.  Coughing or wheezing.  Sore throat.  Hoarseness.  Chest pain.  Nausea and vomiting. How is this diagnosed? This condition may be diagnosed during testing for GERD. Tests that may be done include:  X-rays of your stomach or chest.  An upper gastrointestinal (GI) series. This is an X-ray exam of your GI tract that is taken after you swallow a chalky liquid that shows up clearly on the X-ray.  Endoscopy. This is a procedure to look into your stomach using a thin, flexible tube that has a tiny camera and light on the end of it. How is this treated? This condition may be treated by:  Dietary and lifestyle changes to help reduce GERD symptoms.  Medicines. These may include: ? Over-the-counter antacids. ? Medicines that make your stomach empty more quickly. ? Medicines that block the production of stomach acid (H2  blockers). ? Stronger medicines to reduce stomach acid (proton pump inhibitors).  Surgery to repair the hernia, if other treatments are not helping. If you have no symptoms, you may not need treatment. Follow these instructions at home: Lifestyle and activity  Do not use any products that contain nicotine or tobacco, such  as cigarettes and e-cigarettes. If you need help quitting, ask your health care provider.  Try to achieve and maintain a healthy body weight.  Avoid putting pressure on your abdomen. Anything that puts pressure on your abdomen increases the amount of acid that may be pushed up into your esophagus. ? Avoid bending over, especially after eating. ? Raise the head of your bed by putting blocks under the legs. This keeps your head and esophagus higher than your stomach. ? Do not wear tight clothing around your chest or stomach. ? Try not to strain when having a bowel movement, when urinating, or when lifting heavy objects. Eating and drinking  Avoid foods that can worsen GERD symptoms. These may include: ? Fatty foods, like fried foods. ? Citrus fruits, like oranges or lemon. ? Other foods and drinks that contain acid, like orange juice or tomatoes. ? Spicy food. ? Chocolate.  Eat frequent small meals instead of three large meals a day. This helps prevent your stomach from getting too full. ? Eat slowly. ? Do not lie down right after eating. ? Do not eat 1-2 hours before bed.  Do not drink beverages with caffeine. These include cola, coffee, cocoa, and tea.  Do not drink alcohol. General instructions  Take over-the-counter and prescription medicines only as told by your health care provider.  Keep all follow-up visits as told by your health care provider. This is important. Contact a health care provider if:  Your symptoms are not controlled with medicines or lifestyle changes.  You are having trouble swallowing.  You have coughing or wheezing that will not go away. Get help right away if:  Your pain is getting worse.  Your pain spreads to your arms, neck, jaw, teeth, or back.  You have shortness of breath.  You sweat for no reason.  You feel sick to your stomach (nauseous) or you vomit.  You vomit blood.  You have bright red blood in your stools.  You have black,  tarry stools. This information is not intended to replace advice given to you by your health care provider. Make sure you discuss any questions you have with your health care provider. Document Revised: 12/29/2016 Document Reviewed: 08/21/2016 Elsevier Patient Education  2021 [employer]

## 2020-03-24 NOTE — H&P (Signed)
Lindsay Palmer is an 80 y.o. female.   Chief Complaint: Patient is here for esophagogastroduodenoscopy and colonoscopy. HPI: Patient is 80 year old Caucasian female with multiple medical problems as chronic GERD and remains with recurrent nausea without vomiting as well as abdominal pain primarily across lower abdomen.  She was scheduled for esophagogastroduodenoscopy few weeks ago but she was positive for Covid.  Therefore the procedure was postponed.  In the meantime lab studies revealed anemia and iron deficiency.  She is therefore also undergoing diagnostic colonoscopy.  Her last colonoscopy was 9 or 10 years ago.  She has history of complicated diverticulitis for which she had surgery more than 2 years ago.  She did not have any endoscopic evaluation prior to or after her sigmoid colon resection. Her appetite is fair.  She denies weight loss melena or rectal bleeding.  She is prone to constipation.  She is using polyclinical alcohol. She is on meloxicam 15 mg daily and she has not been able to make it without it.  Past Medical History:  Diagnosis Date  . Bipolar 2 disorder (HCC)   . Chronic insomnia   . Congestive heart failure (CHF) (HCC) 12/2019  . Diverticulosis of colon   . Dyspnea   . Elevated LFTs   . Fatty liver disease, nonalcoholic   . GERD (gastroesophageal reflux disease)   . H/O first degree atrioventricular block   . Hypercholesteremia   . Hypertension   . Major depressive disorder   . Migraine   . Osteopenia   . Pancreatitis   . Pneumonia   . Sleep apnea    Stop Bang score of 4. Pt said she had sleep apnea at one time, but she has had another sleep study since then and they said she does not have it anymore.  . Vitamin D deficiency     Past Surgical History:  Procedure Laterality Date  . BILIARY STENT PLACEMENT N/A 07/18/2013   Procedure: BILIARY STENT PLACEMENT;  Surgeon: Malissa Hippo, MD;  Location: [facility];  Service: Endoscopy;  Laterality: N/A;  . Cataract  Surgery Bilateral 08-2012  . CHOLECYSTECTOMY    . COLONOSCOPY  2003   Diverticulitis h/o polyps -due next 2015 -gastroscopy 2003  . ERCP    . ERCP N/A 07/18/2013   Procedure: ENDOSCOPIC RETROGRADE CHOLANGIOPANCREATOGRAPHY (ERCP) COMMON BILE DUCT BRUSHING;  Surgeon: Malissa Hippo, MD;  Location: [facility];  Service: Endoscopy;  Laterality: N/A;  . ERCP N/A 11/07/2013   Procedure: ENDOSCOPIC RETROGRADE CHOLANGIOPANCREATOGRAPHY (ERCP);  Surgeon: Malissa Hippo, MD;  Location: [facility];  Service: Endoscopy;  Laterality: N/A;  . ESOPHAGEAL DILATION N/A 08/23/2016   Procedure: ESOPHAGEAL DILATION;  Surgeon: Malissa Hippo, MD;  Location: [facility];  Service: Endoscopy;  Laterality: N/A;  . ESOPHAGOGASTRODUODENOSCOPY N/A 08/23/2016   Procedure: ESOPHAGOGASTRODUODENOSCOPY (EGD);  Surgeon: Malissa Hippo, MD;  Location: [facility];  Service: Endoscopy;  Laterality: N/A;  245-moved up to 100 per [name]  . EYE SURGERY Bilateral    cataract removal  . NECK SURGERY     Stenosis C6-C7  . PARTIAL COLECTOMY  2018  . SIGMOIDOSCOPY  2007   no polyps  . SPHINCTEROTOMY N/A 07/18/2013   Procedure: SPHINCTEROTOMY;  Surgeon: Malissa Hippo, MD;  Location: [facility];  Service: Endoscopy;  Laterality: N/A;  . SPHINCTEROTOMY  11/07/2013   Procedure: EXTENSION OF SPHINCTEROTOMY;  Surgeon: Malissa Hippo, MD;  Location: [facility];  Service: Endoscopy;;  . STENT REMOVAL N/A 11/07/2013   Procedure: STENT REMOVAL;  Surgeon: Malissa Hippo, MD;  Location: [facility];  Service: Endoscopy;  Laterality: N/A;    Family History  Problem Relation Age of Onset  . Diabetes Mother   . Coronary artery disease Father   . Gout Father   . Breast cancer Sister   . Pancreatic cancer Brother   . Alcohol abuse Brother   . Healthy Brother   . Lung cancer Sister   . Healthy Sister   . Depression Sister   . Hyperlipidemia Daughter   . Hyperlipidemia Son   . Bipolar disorder Son   . Healthy Sister   . Healthy Sister   . Seizures  Other    Social History:  reports that she has never smoked. She has never used smokeless tobacco. She reports that she does not drink alcohol and does not use drugs.  Allergies:  Allergies  Allergen Reactions  . Codeine Nausea And Vomiting  . Ciprofloxacin Nausea And Vomiting  . Flagyl [Metronidazole] Nausea And Vomiting  . Oxycodone Nausea And Vomiting    Medications Prior to Admission  Medication Sig Dispense Refill  . acetaminophen (TYLENOL) 500 MG tablet Take 500-1,000 mg by mouth every 6 (six) hours as needed (pain).    Marland Kitchen albuterol (PROVENTIL) (2.5 MG/3ML) 0.083% nebulizer solution Take 2.5 mg by nebulization every 6 (six) hours as needed for wheezing or shortness of breath.    . ARIPiprazole (ABILIFY) 10 MG tablet Take 1 tablet (10 mg total) by mouth at bedtime. 90 tablet 2  . carvedilol (COREG) 25 MG tablet Take 25 mg by mouth 2 (two) times daily with a meal. LUNCH & EVENING    . Cholecalciferol (VITAMIN D3) 10 MCG (400 UNIT) CAPS Take 400 Units by mouth daily.    Marland Kitchen esomeprazole (NEXIUM) 40 MG capsule Take 1 capsule (40 mg total) by mouth daily before breakfast. (Patient taking differently: Take 40 mg by mouth every evening.) 30 capsule 5  . ferrous sulfate (FERROUSUL) 325 (65 FE) MG tablet Take 1 tablet (325 mg total) by mouth 2 (two) times daily.  0  . furosemide (LASIX) 40 MG tablet Take 40 mg by mouth daily.    . hyoscyamine (LEVSIN SL) 0.125 MG SL tablet Place 0.125 mg under the tongue in the morning and at bedtime.    . lamoTRIgine (LAMICTAL) 200 MG tablet Take 1 tablet (200 mg total) by mouth daily. 90 tablet 2  . losartan (COZAAR) 100 MG tablet Take 100 mg by mouth daily.    . meloxicam (MOBIC) 15 MG tablet Take 15 mg by mouth daily.    . Omega-3 Fatty Acids (FISH OIL PO) Take 1 capsule by mouth in the morning and at bedtime.    . ondansetron (ZOFRAN) 4 MG tablet TAKE 1 TABLET BY MOUTH EVERY 8 HOURS AS NEEDED FOR NAUSEA AND VOMITING (Patient taking differently: Take 4 mg  by mouth daily.) 60 tablet 4  . polyethylene glycol powder (GLYCOLAX/MIRALAX) powder Take 17 grams daily prn for Constipation. (Patient taking differently: Take 17 g by mouth daily as needed for moderate constipation. Take 17 grams daily prn for Constipation.) 1581 g 3  . polyethylene glycol-electrolytes (TRILYTE) 420 g solution Take 4,000 mLs by mouth as directed. 4000 mL 0  . potassium chloride (KLOR-CON) 10 MEQ tablet Take 10 mEq by mouth daily.    . simvastatin (ZOCOR) 20 MG tablet Take 20 mg by mouth daily with lunch.    . torsemide (DEMADEX) 20 MG tablet Take 20 mg by mouth daily.     Marland Kitchen  vitamin B-12 (CYANOCOBALAMIN) 1000 MCG tablet Take 1,000 mcg by mouth every evening.    . zolpidem (AMBIEN) 10 MG tablet Take 1 tablet (10 mg total) by mouth at bedtime. 90 tablet 2    No results found for this or any previous visit (from the past 48 hour(s)). No results found.  Review of Systems  Blood pressure (!) 149/66, pulse 93, temperature 98.1 F (36.7 C), temperature source Oral, resp. rate 13, SpO2 100 %. Physical Exam HENT:     Mouth/Throat:     Mouth: Mucous membranes are moist.     Pharynx: Oropharynx is clear.  Eyes:     General: No scleral icterus.    Conjunctiva/sclera: Conjunctivae normal.  Cardiovascular:     Rate and Rhythm: Normal rate and regular rhythm.     Heart sounds: Normal heart sounds. No murmur heard.   Pulmonary:     Effort: Pulmonary effort is normal.     Breath sounds: Normal breath sounds.  Abdominal:     Comments: Abdomen is full.  She has low midline scar and umbilical hernia.  Hernia is reducible.  On palpation abdomen is soft and nontender with organomegaly or masses  Musculoskeletal:        General: No swelling.     Cervical back: Neck supple.  Lymphadenopathy:     Cervical: No cervical adenopathy.  Skin:    General: Skin is warm and dry.  Neurological:     Mental Status: She is alert.      Assessment/Plan  Recurrent nausea and lower abdominal  pain Iron deficiency anemia. Diagnostic esophagogastroduodenoscopy and colonoscopy.  Lionel December, MD 03/24/2020, 9:01 AM

## 2020-03-25 LAB — SURGICAL PATHOLOGY

## 2020-03-26 ENCOUNTER — Encounter (HOSPITAL_COMMUNITY): Payer: Self-pay | Admitting: Internal Medicine

## 2020-03-29 ENCOUNTER — Other Ambulatory Visit (INDEPENDENT_AMBULATORY_CARE_PROVIDER_SITE_OTHER): Payer: Self-pay | Admitting: Internal Medicine

## 2020-03-29 NOTE — Telephone Encounter (Signed)
Note on Dr. Rehman's desk 

## 2020-04-01 ENCOUNTER — Other Ambulatory Visit (INDEPENDENT_AMBULATORY_CARE_PROVIDER_SITE_OTHER): Payer: Self-pay | Admitting: Internal Medicine

## 2020-04-01 MED ORDER — ESOMEPRAZOLE MAGNESIUM 40 MG PO CPDR
40.0000 mg | DELAYED_RELEASE_CAPSULE | Freq: Every day | ORAL | 5 refills | Status: DC
Start: 2020-04-01 — End: 2020-10-22

## 2020-04-01 NOTE — Progress Notes (Signed)
Patient will continue Esomeprazole. Dexilant 30 mg is $11.43 per day.

## 2020-04-02 ENCOUNTER — Other Ambulatory Visit (INDEPENDENT_AMBULATORY_CARE_PROVIDER_SITE_OTHER): Payer: Self-pay | Admitting: Internal Medicine

## 2020-04-02 ENCOUNTER — Other Ambulatory Visit (HOSPITAL_COMMUNITY): Payer: Self-pay | Admitting: Psychiatry

## 2020-04-05 NOTE — Telephone Encounter (Signed)
Did Dr. Karilyn Cota address this?

## 2020-04-21 ENCOUNTER — Telehealth (INDEPENDENT_AMBULATORY_CARE_PROVIDER_SITE_OTHER): Payer: Self-pay | Admitting: *Deleted

## 2020-04-21 NOTE — Telephone Encounter (Signed)
Patient has been approved for her Esomeprazole 40 mg. The authorization is approved until 01/19/2021. A reminder note is in place to remind that it be done in December. [employer] : member ID [id number]. [phone].  Patient was made aware.

## 2020-06-03 ENCOUNTER — Encounter (INDEPENDENT_AMBULATORY_CARE_PROVIDER_SITE_OTHER): Payer: Self-pay | Admitting: Internal Medicine

## 2020-06-03 ENCOUNTER — Ambulatory Visit (INDEPENDENT_AMBULATORY_CARE_PROVIDER_SITE_OTHER): Payer: Medicare Other | Admitting: Internal Medicine

## 2020-06-03 ENCOUNTER — Other Ambulatory Visit: Payer: Self-pay

## 2020-06-03 VITALS — BP 179/60 | HR 42 | Temp 98.8°F | Ht 64.0 in | Wt 188.6 lb

## 2020-06-03 DIAGNOSIS — D509 Iron deficiency anemia, unspecified: Secondary | ICD-10-CM

## 2020-06-03 DIAGNOSIS — R1030 Lower abdominal pain, unspecified: Secondary | ICD-10-CM | POA: Diagnosis not present

## 2020-06-03 DIAGNOSIS — R110 Nausea: Secondary | ICD-10-CM | POA: Diagnosis not present

## 2020-06-03 NOTE — Progress Notes (Signed)
Presenting complaint;  Follow-up for lower abdominal pain nausea and history of iron deficiency anemia.  Database and subjective:  Patient is 80 year old Caucasian female who is here for scheduled visit accompanied by her husband Lanny Hurst.  She was last seen in the office in December 2021.  She has been hospitalized at [facility] in October for bilateral pneumonia and readmitted with pneumonia and CHF in November 2021. She was discovered to have iron deficiency anemia.  She underwent esophagogastroduodenoscopy on 03/24/2020.  EGD revealed small sliding hiatal hernia reactive changes on antral biopsy and normal duodenal mucosa.  Colonoscopy revealed few diverticula at sigmoid colon patent colonic anastomosis and external hemorrhoids.  Patient was begun on p.o. iron and her hemoglobin. Patient says she had blood work few weeks ago by Ms. Shari Prows, NP and she also had some blood work by Dr. Alroy Dust.  She states she is advised to back off on her torsemide which she is taking every other day. Patient continues to complain of nausea.  She has nausea every day but is not constant.  Every now and then she can have a day or 2 without nausea like recently she went 2-1/2 days without nausea according to her husband Lanny Hurst.  However she never vomits.  Nausea seem to get worse with meals and she gets relief with ondansetron which she is using almost daily.  She denies heartburn or regurgitation she feels esomeprazole is working well.  She does not feel her nausea has gotten worse since she has been iron. She continues to complain of pain across the lower abdomen.  Pain occurs 2-3 times a week.  She says there is no pattern to this pain.  It occurs at different times.  This pain does not seem to get worse with meals or defecation.  She is using polyethylene glycol on as-needed basis.  She generally has bowel movement daily although she has had diarrhea over the last day or 2.  She denies melena or rectal  bleeding.  She says pain can be quite intense and can last for couple hours and sometimes half a day.  Levsin sublingual has helped.  This pain is not associated with fever chills urgency diarrhea or hematuria. She says she has good appetite.  She generally eats 3 meals a day.  According to her husband these are small meals.  Sometimes he just has snack at lunchtime.  She has not lost any weight since her last visit. Patient states she has felt stronger since she has been on p.o. iron.  Her husband states that she does not do any physical activity other than walking from bedroom to then to kitchen bathroom etc.  Current Medications: Outpatient Encounter Medications as of 06/03/2020  Medication Sig  . albuterol (PROVENTIL) (2.5 MG/3ML) 0.083% nebulizer solution Take 2.5 mg by nebulization every 6 (six) hours as needed for wheezing or shortness of breath.  . ARIPiprazole (ABILIFY) 10 MG tablet Take 1 tablet (10 mg total) by mouth at bedtime.  . carvedilol (COREG) 25 MG tablet Take 25 mg by mouth 2 (two) times daily with a meal. LUNCH & EVENING  . Cholecalciferol (VITAMIN D3) 10 MCG (400 UNIT) CAPS Take 400 Units by mouth daily.  Marland Kitchen esomeprazole (NEXIUM) 40 MG capsule Take 1 capsule (40 mg total) by mouth daily before breakfast.  . ferrous sulfate (FERROUSUL) 325 (65 FE) MG tablet Take 1 tablet (325 mg total) by mouth 2 (two) times daily. (Patient taking differently: Take 325 mg by mouth daily  with breakfast.)  . hyoscyamine (LEVSIN SL) 0.125 MG SL tablet PLACE 1-2 TABLETS UNDER THE TONGUE 2 (TWO) TIMES DAILY AS NEEDED.  Marland Kitchen lamoTRIgine (LAMICTAL) 200 MG tablet Take 1 tablet (200 mg total) by mouth daily.  Marland Kitchen losartan (COZAAR) 100 MG tablet Take 100 mg by mouth daily.  . meloxicam (MOBIC) 15 MG tablet Take 1 tablet (15 mg total) by mouth daily.  . Omega-3 Fatty Acids (FISH OIL PO) Take 1 capsule by mouth in the morning and at bedtime.  . ondansetron (ZOFRAN) 4 MG tablet TAKE 1 TABLET BY MOUTH EVERY 8 HOURS  AS NEEDED FOR NAUSEA AND VOMITING (Patient taking differently: Take 4 mg by mouth daily.)  . polyethylene glycol powder (GLYCOLAX/MIRALAX) powder Take 17 grams daily prn for Constipation. (Patient taking differently: Take 17 g by mouth daily as needed for moderate constipation. Take 17 grams daily prn for Constipation.)  . potassium chloride (KLOR-CON) 10 MEQ tablet Take 10 mEq by mouth daily.  . simvastatin (ZOCOR) 20 MG tablet Take 20 mg by mouth daily with lunch.  . torsemide (DEMADEX) 20 MG tablet Take 20 mg by mouth daily.   . vitamin B-12 (CYANOCOBALAMIN) 1000 MCG tablet Take 1,000 mcg by mouth every evening.  . zolpidem (AMBIEN) 10 MG tablet Take 1 tablet (10 mg total) by mouth at bedtime.  . [DISCONTINUED] furosemide (LASIX) 40 MG tablet Take 40 mg by mouth daily.  Marland Kitchen acetaminophen (TYLENOL) 500 MG tablet Take 500-1,000 mg by mouth every 6 (six) hours as needed (pain). (Patient not taking: Reported on 06/03/2020)   No facility-administered encounter medications on file as of 06/03/2020.     Objective: Blood pressure (!) 179/60, pulse (!) 42, temperature 98.8 F (37.1 C), temperature source Oral, height $RemoveBefo'5\' 4"'vNXFnboeNpJ$  (1.626 m), weight 188 lb 9.6 oz (85.5 kg). Patient is alert and in no acute distress. She is wearing a mask. Conjunctiva is pink. Sclera is nonicteric Oropharyngeal mucosa is normal. No neck masses or thyromegaly noted. Cardiac exam with regular rhythm normal S1 and S2. No murmur or gallop noted. Lungs are clear to auscultation. Abdomen is full.  She has long midline scar.  He has small umbilical hernia which is completely reducible.  She also has horizontal scar in right abdomen.  Bowel sounds are normal.  No bruit noted.  On palpation abdomen is soft.  She has mild tenderness in LLQ on deep palpation.  There is no guarding or rebound. No LE edema or clubbing noted.  Labs/studies Results:   CBC Latest Ref Rng & Units 03/24/2020 02/24/2020 01/20/2020  WBC 4.0 - 10.5 K/uL 4.7 5.7  5.3  Hemoglobin 12.0 - 15.0 g/dL 11.1(L) 8.6(L) 8.5(L)  Hematocrit 36.0 - 46.0 % 37.6 30.3(L) 29.0(L)  Platelets 150 - 400 K/uL 214 272 248    CMP Latest Ref Rng & Units 02/25/2020 02/24/2020 01/20/2020  Glucose 70 - 99 mg/dL - 96 104(H)  BUN 8 - 23 mg/dL - 21 15  Creatinine 0.44 - 1.00 mg/dL - 1.34(H) 1.17(H)  Sodium 135 - 145 mmol/L - 141 142  Potassium 3.5 - 5.1 mmol/L - 3.5 4.1  Chloride 98 - 111 mmol/L - 103 103  CO2 22 - 32 mmol/L - 27 26  Calcium 8.9 - 10.3 mg/dL - 8.9 9.0  Total Protein 6.5 - 8.1 g/dL 6.9 - 7.1  Total Bilirubin 0.3 - 1.2 mg/dL 0.6 - 0.8  Alkaline Phos 38 - 126 U/L 84 - 75  AST 15 - 41 U/L 17 - 18  ALT  0 - 44 U/L 13 - 12    Hepatic Function Latest Ref Rng & Units 02/25/2020 01/20/2020 04/28/2019  Total Protein 6.5 - 8.1 g/dL 6.9 7.1 7.1  Albumin 3.5 - 5.0 g/dL 3.3(L) 3.4(L) -  AST 15 - 41 U/L $Remo'17 18 17  'iENOM$ ALT 0 - 44 U/L $Remo'13 12 13  'UjTdH$ Alk Phosphatase 38 - 126 U/L 84 75 -  Total Bilirubin 0.3 - 1.2 mg/dL 0.6 0.8 0.4  Bilirubin, Direct 0.0 - 0.2 mg/dL 0.1 - -      Assessment:  #1.  Chronic nausea.  She has had nausea for several years.  She had normal EGD by Dr. Lutricia Horsfall in 2005.  She had gastric emptying study in January 2017 with 85% emptying at 4 hours but emptying at 1 to and 3 hours was normal(33%, 66% and 76% ).  I am not convinced that her chronic nausea is due to borderline gastric emptying study unless gastric emptying has worsened.  More recently she had EGD by me in February 2022 revealing small sliding hiatal hernia and gastritis.  Biopsies were negative for H. pylori infection.  Duodenal biopsy negative for celiac disease. Nausea does not appear to be due to GERD as her heartburn is well controlled with PPI therapy. Nausea seem to be worse after meals which she never vomits.  She responds to as needed ondansetron..  She is on multiple medications but these do not seem to make her nausea worse. She may benefit from repeat gastric emptying study at some point  to determine if she would be candidate for promotility agent which will have to be medication other than metoclopramide.  #3.  History of iron deficiency anemia.  She had EGD and colonoscopy on 03/24/2020.  EGD was negative for peptic ulcer disease or other bleeding lesions.  Gastric biopsy revealed reactive changes and duodenal biopsy was normal.  Colonoscopy revealed few diverticula at proximal sigmoid colon, healthy colonic anastomosis and external hemorrhoids.  Iron deficiency anemia felt to be due to impaired iron absorption in setting of chronic PPI use.  She has responded to p.o. iron.  #4.  Lower abdominal pain.  She has had this pain off and on for years.  Work-up has been negative.  She had had CT and April 2021 which did not reveal any abnormality to account for pain.  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  She had sigmoid colon resection for complicated diverticulitis and 2018 pain is not typical of abdominal angina.  She has history of IBS but this pain pain is not typical of IBS.  Nevertheless she is getting some relief with hyoscyamine sublingual but pain is occurring couple of times a week.  Evaluation during an acute episode in the emergency room would be valuable.  She has umbilical hernia which is completely reducible and possibly not the source of this pain.  #4.  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  Procedure was complicated by pancreatitis and pseudocyst which resolved without any intervention.  She had 24 x 11 mm soft tissue density with peripheral calcification felt to be residual cyst in central mesentery.  Her LFTs have remained normal.  Plan:  Will request copy of recent blood work from Dr. Virgilio Belling and PCPs office before any tests ordered. Referral to [facility] GI division for their opinion regarding patient's chronic nausea and lower abdominal pain. Patient encouraged to increase  physical activity  as tolerated. Office visit in 4 months.

## 2020-06-03 NOTE — Patient Instructions (Signed)
Will arrange for consultation at [facility] for further evaluation of nausea and lower abdominal pain. Please consider evaluation in ER if abdominal pain is not relieved with sublingual Levsin/hyoscyamine.

## 2020-07-13 ENCOUNTER — Telehealth (HOSPITAL_COMMUNITY): Payer: Self-pay | Admitting: *Deleted

## 2020-07-13 NOTE — Telephone Encounter (Signed)
Patient is needing refills for all of her medications provider prescribes for her. Patient appt was cancelled due to provider being on medical leave. Message was left to call office to resch appt.  

## 2020-07-15 ENCOUNTER — Other Ambulatory Visit (HOSPITAL_COMMUNITY): Payer: Self-pay | Admitting: Psychiatry

## 2020-07-15 MED ORDER — ZOLPIDEM TARTRATE 10 MG PO TABS: 10.0000 mg | ORAL_TABLET | Freq: Every day | ORAL | 2 refills | Status: DC

## 2020-07-15 MED ORDER — LAMOTRIGINE 200 MG PO TABS: 200.0000 mg | ORAL_TABLET | Freq: Every day | ORAL | 2 refills | Status: DC

## 2020-07-15 MED ORDER — ARIPIPRAZOLE 10 MG PO TABS: 10.0000 mg | ORAL_TABLET | Freq: Every day | ORAL | 2 refills | Status: DC

## 2020-07-15 NOTE — Telephone Encounter (Signed)
sent 

## 2020-07-20 ENCOUNTER — Telehealth (INDEPENDENT_AMBULATORY_CARE_PROVIDER_SITE_OTHER): Payer: Self-pay

## 2020-07-20 NOTE — Telephone Encounter (Signed)
Patient called to see if Dr. Karilyn Cota had spoken with someone in [place] to see if someone there could see her. As she has not heard from the office.

## 2020-07-20 NOTE — Telephone Encounter (Signed)
Patient aware that we have sent the referral on 06/22/2020 per [name]. I gave her the phone number to [facility] of [phone] to call to see where her appointment date stands. She states understanding.

## 2020-08-09 ENCOUNTER — Telehealth (HOSPITAL_COMMUNITY): Payer: Medicare Other | Admitting: Psychiatry

## 2020-08-16 ENCOUNTER — Telehealth (INDEPENDENT_AMBULATORY_CARE_PROVIDER_SITE_OTHER): Payer: Self-pay | Admitting: *Deleted

## 2020-08-16 ENCOUNTER — Other Ambulatory Visit (INDEPENDENT_AMBULATORY_CARE_PROVIDER_SITE_OTHER): Payer: Self-pay | Admitting: Internal Medicine

## 2020-08-16 DIAGNOSIS — K219 Gastro-esophageal reflux disease without esophagitis: Secondary | ICD-10-CM

## 2020-08-16 NOTE — Telephone Encounter (Signed)
Spoke to scheduler today and on 08/03/20 - patient is on wait list - they will call patient when they have a cancellation

## 2020-09-06 ENCOUNTER — Other Ambulatory Visit: Payer: Self-pay

## 2020-09-06 ENCOUNTER — Encounter (HOSPITAL_COMMUNITY): Payer: Self-pay | Admitting: Psychiatry

## 2020-09-06 ENCOUNTER — Telehealth (INDEPENDENT_AMBULATORY_CARE_PROVIDER_SITE_OTHER): Payer: Medicare Other | Admitting: Psychiatry

## 2020-09-06 DIAGNOSIS — F3130 Bipolar disorder, current episode depressed, mild or moderate severity, unspecified: Secondary | ICD-10-CM | POA: Diagnosis not present

## 2020-09-06 MED ORDER — ARIPIPRAZOLE 10 MG PO TABS: 10.0000 mg | ORAL_TABLET | Freq: Every day | ORAL | 2 refills | Status: DC

## 2020-09-06 MED ORDER — ZOLPIDEM TARTRATE 10 MG PO TABS: 10.0000 mg | ORAL_TABLET | Freq: Every day | ORAL | 2 refills | Status: DC

## 2020-09-06 MED ORDER — LAMOTRIGINE 200 MG PO TABS: 200.0000 mg | ORAL_TABLET | Freq: Every day | ORAL | 2 refills | Status: DC

## 2020-09-06 NOTE — Progress Notes (Signed)
Virtual Visit via Telephone Note  I connected with Vedha Tercero on 09/06/20 at  3:20 PM EDT by telephone and verified that I am speaking with the correct person using two identifiers.  Location: Patient: home Provider: home office   I discussed the limitations, risks, security and privacy concerns of performing an evaluation and management service by telephone and the availability of in person appointments. I also discussed with the patient that there may be a patient responsible charge related to this service. The patient expressed understanding and agreed to proceed.      I discussed the assessment and treatment plan with the patient. The patient was provided an opportunity to ask questions and all were answered. The patient agreed with the plan and demonstrated an understanding of the instructions.   The patient was advised to call back or seek an in-person evaluation if the symptoms worsen or if the condition fails to improve as anticipated.  I provided 15 minutes of non-face-to-face time during this encounter.   Diannia Ruder, MD  [facility] MD/PA/NP OP Progress Note  09/06/2020 3:35 PM Saxon Crosby  MRN:  [id number]  Chief Complaint:  Chief Complaint   Depression; Manic Behavior; Follow-up    HPI: This patient is a 80 year old married white female who lives with her husband in [place].  She is retired from [employer]  She has 1 son and 1 daughter and 3 grandchildren.  The patient returns for follow-up after 8 months.  She states that she continues to have trouble with abdominal pain and nausea.  She is still able to eat and has not lost any weight.  Her endoscopy and colonoscopy were not very revealing.  She does have mild iron deficiency anemia and is now taking iron.  In terms of mood she continues to do well.  She denies being depressed or suicidal.  She denies manic symptoms such as racing thoughts or disorganized thinking.  She is sleeping well.  She feels like  her medications are working well for her. Visit Diagnosis:    ICD-10-CM   1. Bipolar I disorder, most recent episode depressed (HCC)  F31.30       Past Psychiatric History: Long-term outpatient treatment for bipolar disorder  Past Medical History:  Past Medical History:  Diagnosis Date   Bipolar 2 disorder (HCC)    Chronic insomnia    Congestive heart failure (CHF) (HCC) 12/2019   Diverticulosis of colon    Dyspnea    Elevated LFTs    Fatty liver disease, nonalcoholic    GERD (gastroesophageal reflux disease)    H/O first degree atrioventricular block    Hypercholesteremia    Hypertension    Major depressive disorder    Migraine    Osteopenia    Pancreatitis    Pneumonia    Sleep apnea    Stop Bang score of 4. Pt said she had sleep apnea at one time, but she has had another sleep study since then and they said she does not have it anymore.   Vitamin D deficiency     Past Surgical History:  Procedure Laterality Date   BILIARY STENT PLACEMENT N/A 07/18/2013   Procedure: BILIARY STENT PLACEMENT;  Surgeon: Malissa Hippo, MD;  Location: [facility];  Service: Endoscopy;  Laterality: N/A;   BIOPSY  03/24/2020   Procedure: BIOPSY;  Surgeon: Malissa Hippo, MD;  [facility]: [facility];  Service: Endoscopy;;   Cataract Surgery Bilateral 08-2012   CHOLECYSTECTOMY  COLONOSCOPY  2003   Diverticulitis h/o polyps -due next 2015 -gastroscopy 2003   COLONOSCOPY WITH PROPOFOL N/A 03/24/2020   Procedure: COLONOSCOPY WITH PROPOFOL;  Surgeon: Malissa Hippo, MD;  Location: [facility];  Service: Endoscopy;  Laterality: N/A;  9:00, pt tested + 1/25 <90 days   ERCP     ERCP N/A 07/18/2013   Procedure: ENDOSCOPIC RETROGRADE CHOLANGIOPANCREATOGRAPHY (ERCP) COMMON BILE DUCT BRUSHING;  Surgeon: Malissa Hippo, MD;  Location: [facility];  Service: Endoscopy;  Laterality: N/A;   ERCP N/A 11/07/2013   Procedure: ENDOSCOPIC RETROGRADE CHOLANGIOPANCREATOGRAPHY (ERCP);  Surgeon: Malissa Hippo, MD;   Location: [facility];  Service: Endoscopy;  Laterality: N/A;   ESOPHAGEAL DILATION N/A 08/23/2016   Procedure: ESOPHAGEAL DILATION;  Surgeon: Malissa Hippo, MD;  [facility];  Service: Endoscopy;  Laterality: N/A;   ESOPHAGOGASTRODUODENOSCOPY N/A 08/23/2016   Procedure: ESOPHAGOGASTRODUODENOSCOPY (EGD);  Surgeon: Malissa Hippo, MD;  [facility];  Service: Endoscopy;  Laterality: N/A;  245-moved up to 100 per [name]   ESOPHAGOGASTRODUODENOSCOPY (EGD) WITH PROPOFOL N/A 03/24/2020   Procedure: ESOPHAGOGASTRODUODENOSCOPY (EGD) WITH PROPOFOL;  Surgeon: Malissa Hippo, MD;  [facility];  Service: Endoscopy;  Laterality: N/A;   EYE SURGERY Bilateral    cataract removal   NECK SURGERY     Stenosis C6-C7   PARTIAL COLECTOMY  2018   SIGMOIDOSCOPY  2007   no polyps   SPHINCTEROTOMY N/A 07/18/2013   Procedure: SPHINCTEROTOMY;  Surgeon: Malissa Hippo, MD;  Location: [facility];  Service: Endoscopy;  Laterality: N/A;   SPHINCTEROTOMY  11/07/2013   Procedure: EXTENSION OF SPHINCTEROTOMY;  Surgeon: Malissa Hippo, MD;  Location: [facility];  Service: Endoscopy;;   STENT REMOVAL N/A 11/07/2013   Procedure: STENT REMOVAL;  Surgeon: Malissa Hippo, MD;  Location: [facility];  Service: Endoscopy;  Laterality: N/A;    Family Psychiatric History: see below  Family History:  Family History  Problem Relation Age of Onset   Diabetes Mother    Coronary artery disease Father    Gout Father    Breast cancer Sister    Pancreatic cancer Brother    Alcohol abuse Brother    Healthy Brother    Lung cancer Sister    Healthy Sister    Depression Sister    Hyperlipidemia Daughter    Hyperlipidemia Son    Bipolar disorder Son    Healthy Sister    Healthy Sister    Seizures Other     Social History:  Social History   Socioeconomic History   Marital status: Married    Spouse name: Not on file   Number of children: Not on file   Years of education: Not on file   Highest education  level: Not on file  Occupational History   Not on file  Tobacco Use   Smoking status: Never   Smokeless tobacco: Never  Vaping Use   Vaping Use: Never used  Substance and Sexual Activity   Alcohol use: No    Alcohol/week: 0.0 standard drinks   Drug use: No   Sexual activity: Not on file  Other Topics Concern   Not on file  Social History Narrative   Not on file   Social Determinants of Health   Financial Resource Strain: Not on file  Food Insecurity: Not on file  Transportation Needs: Not on file  Physical Activity: Not on file  Stress: Not on file  Social Connections: Not on file  Allergies:  Allergies  Allergen Reactions   Codeine Nausea And Vomiting   Ciprofloxacin Nausea And Vomiting   Flagyl [Metronidazole] Nausea And Vomiting   Oxycodone Nausea And Vomiting    Metabolic Disorder Labs: No results found for: HGBA1C, MPG No results found for: PROLACTIN Lab Results  Component Value Date   TRIG 113 07/28/2013   Lab Results  Component Value Date   TSH 1.213 09/14/2014    Therapeutic Level Labs: No results found for: LITHIUM No results found for: VALPROATE No components found for:  CBMZ  Current Medications: Current Outpatient Medications  Medication Sig Dispense Refill   acetaminophen (TYLENOL) 500 MG tablet Take 500-1,000 mg by mouth every 6 (six) hours as needed (pain). (Patient not taking: Reported on 06/03/2020)     albuterol (PROVENTIL) (2.5 MG/3ML) 0.083% nebulizer solution Take 2.5 mg by nebulization every 6 (six) hours as needed for wheezing or shortness of breath.     ARIPiprazole (ABILIFY) 10 MG tablet Take 1 tablet (10 mg total) by mouth at bedtime. 90 tablet 2   carvedilol (COREG) 25 MG tablet Take 25 mg by mouth 2 (two) times daily with a meal. LUNCH & EVENING     Cholecalciferol (VITAMIN D3) 10 MCG (400 UNIT) CAPS Take 400 Units by mouth daily.     esomeprazole (NEXIUM) 40 MG capsule Take 1 capsule (40 mg total) by mouth daily before  breakfast. 30 capsule 5   ferrous sulfate (FERROUSUL) 325 (65 FE) MG tablet Take 1 tablet (325 mg total) by mouth 2 (two) times daily. (Patient taking differently: Take 325 mg by mouth daily with breakfast.)  0   hyoscyamine (LEVSIN SL) 0.125 MG SL tablet PLACE 1-2 TABLETS UNDER THE TONGUE 2 (TWO) TIMES DAILY AS NEEDED. 120 tablet 2   lamoTRIgine (LAMICTAL) 200 MG tablet Take 1 tablet (200 mg total) by mouth daily. 90 tablet 2   losartan (COZAAR) 100 MG tablet Take 100 mg by mouth daily.     meloxicam (MOBIC) 15 MG tablet Take 1 tablet (15 mg total) by mouth daily.     Omega-3 Fatty Acids (FISH OIL PO) Take 1 capsule by mouth in the morning and at bedtime.     ondansetron (ZOFRAN) 4 MG tablet TAKE 1 TABLET BY MOUTH EVERY 8 HOURS AS NEEDED FOR NAUSEA AND VOMITING (Patient taking differently: Take 4 mg by mouth daily.) 60 tablet 4   polyethylene glycol powder (GLYCOLAX/MIRALAX) powder Take 17 grams daily prn for Constipation. (Patient taking differently: Take 17 g by mouth daily as needed for moderate constipation. Take 17 grams daily prn for Constipation.) 1581 g 3   potassium chloride (KLOR-CON) 10 MEQ tablet Take 10 mEq by mouth daily.     simvastatin (ZOCOR) 20 MG tablet Take 20 mg by mouth daily with lunch.     torsemide (DEMADEX) 20 MG tablet Take 20 mg by mouth every other day.     vitamin B-12 (CYANOCOBALAMIN) 1000 MCG tablet Take 1,000 mcg by mouth every evening.     zolpidem (AMBIEN) 10 MG tablet Take 1 tablet (10 mg total) by mouth at bedtime. 90 tablet 2   No current facility-administered medications for this visit.     Musculoskeletal: Strength & Muscle Tone: within normal limits Gait & Station: normal Patient leans: N/A  Psychiatric Specialty Exam: Review of Systems  Gastrointestinal:  Positive for abdominal pain and nausea.  Musculoskeletal:  Positive for back pain.  All other systems reviewed and are negative.  There were no vitals taken for  this visit.There is no height or  weight on file to calculate BMI.  General Appearance: NA  Eye Contact:  NA  Speech:  Clear and Coherent  Volume:  Normal  Mood:  Euthymic  Affect:  NA  Thought Process:  Goal Directed  Orientation:  Full (Time, Place, and Person)  Thought Content: Rumination   Suicidal Thoughts:  No  Homicidal Thoughts:  No  Memory:  Immediate;   Good Recent;   Good Remote;   Good  Judgement:  Good  Insight:  Fair  Psychomotor Activity:  Normal  Concentration:  Concentration: Good and Attention Span: Good  Recall:  Good  Fund of Knowledge: Good  Language: Good  Akathisia:  No  Handed:  Right  AIMS (if indicated): not done  Assets:  Communication Skills Desire for Improvement Resilience Social Support  ADL's:  Intact  Cognition: WNL  Sleep:  Good   Screenings: PHQ2-9    Flowsheet Row [facility]  PHQ-2 Total Score 1      Flowsheet Row Video Visit from 09/06/2020 in [facility] Admission (Discharged) [facility]Ad[facility] Testing 60 from [facility]/24/2020 [facility]  C-SSRS RISK CATEGORY No Risk No Risk Error: Question 6 not populated        Assessment and Plan: This patient is a 80 year old female with a history of bipolar disorder.  She is very stable on her current regimen.  She will continue Abilify 10 mg at bedtime as well as Lamictal 200 mg daily for mood stabilization and Ambien 10 mg at bedtime for sleep.  She will return to see me in 6 months   Diannia Rudereborah Reverie Vaquera, MD 09/06/2020, 3:35 PM

## 2020-09-20 ENCOUNTER — Telehealth (INDEPENDENT_AMBULATORY_CARE_PROVIDER_SITE_OTHER): Payer: Self-pay | Admitting: *Deleted

## 2020-09-20 NOTE — Telephone Encounter (Signed)
Pt requesting refill on purelax. Uses it once daily. Last seen 06/03/20 and has upcoming appt on 10/05/20 [facility] [address] [place]  Pt's number [phone]

## 2020-09-21 ENCOUNTER — Other Ambulatory Visit (INDEPENDENT_AMBULATORY_CARE_PROVIDER_SITE_OTHER): Payer: Self-pay | Admitting: *Deleted

## 2020-09-21 DIAGNOSIS — K5900 Constipation, unspecified: Secondary | ICD-10-CM

## 2020-09-21 MED ORDER — POLYETHYLENE GLYCOL 3350 17 GM/SCOOP PO POWD: ORAL | 5 refills | Status: DC

## 2020-09-21 NOTE — Telephone Encounter (Signed)
Ok to refill plus 5 additional refills per dr Karilyn Cota. Med sent to pharm and pt was notified.

## 2020-09-22 NOTE — Telephone Encounter (Signed)
error 

## 2020-09-30 ENCOUNTER — Other Ambulatory Visit (HOSPITAL_COMMUNITY): Payer: Self-pay | Admitting: Psychiatry

## 2020-10-05 ENCOUNTER — Encounter (INDEPENDENT_AMBULATORY_CARE_PROVIDER_SITE_OTHER): Payer: Self-pay | Admitting: Internal Medicine

## 2020-10-05 ENCOUNTER — Ambulatory Visit (INDEPENDENT_AMBULATORY_CARE_PROVIDER_SITE_OTHER): Payer: Medicare Other | Admitting: Internal Medicine

## 2020-10-05 ENCOUNTER — Other Ambulatory Visit: Payer: Self-pay

## 2020-10-05 VITALS — BP 156/58 | HR 69 | Temp 99.4°F | Ht 64.0 in | Wt 192.7 lb

## 2020-10-05 DIAGNOSIS — D509 Iron deficiency anemia, unspecified: Secondary | ICD-10-CM

## 2020-10-05 DIAGNOSIS — R110 Nausea: Secondary | ICD-10-CM

## 2020-10-05 NOTE — Patient Instructions (Addendum)
Take ondansetron 8 mg at 4 AM daily for 2 weeks and call with progress report to see if taking medication at this time helps prevent nausea. Please call office [place]  Remember to take polyethylene glycol on days when you do not have a spontaneous bowel movement.  You can take half a scoop instead of the full school. Physician will call with results of blood work when completed

## 2020-10-05 NOTE — Progress Notes (Signed)
Presenting complaint;  Follow-up for nausea and constipation.  Database and subjective:  Patient is 80 year old Caucasian female who is here for scheduled visit accompanied by her husband Lanny Hurst. Patient was last seen on 06/03/2020 and paperwork was sent to [facility] for consultation regarding her chronic nausea but they declined to see her.  We therefore sent a request to [facility] but she has not heard anything from them yet.  Patient remains with nausea.  She has nausea virtually every day.  On most mornings she is nauseated when she wakes up.  Nausea seem to get worse after meals and may last for couple of hours or all day long.  Nausea is never associated with vomiting.  She denies heartburn.  She says her bowels move every day.  Recently thought she was constipated and refilled polyethylene glycol but she did not have to take it.  She denies melena or rectal bleeding. According to her husband she does not do much physical activity other than walking from the bedroom to the kitchen to the den to the bathroom. He uses oxygen usually at night and during the daytime on as-needed basis. She eats 3 small meals a day.  She has gained 4 pounds since her last visit.  She says she is taking fluid pill every other day because of concern for kidney damage. She says abdominal pain is less frequent and he uses Levsin no more than once a week.  Pain is across upper abdomen. She says she feels well as long she takes Ambien. She is under care of Dr. Levonne Spiller for management of her bipolar disorder.  She says last few visits for virtual.  Current Medications: Outpatient Encounter Medications as of 10/05/2020  Medication Sig   acetaminophen (TYLENOL) 500 MG tablet Take 500-1,000 mg by mouth every 6 (six) hours as needed (pain).   albuterol (PROVENTIL) (2.5 MG/3ML) 0.083% nebulizer solution Take 2.5 mg by nebulization every 6 (six) hours as needed for wheezing or shortness of breath.   ARIPiprazole (ABILIFY)  10 MG tablet Take 1 tablet (10 mg total) by mouth at bedtime.   carvedilol (COREG) 25 MG tablet Take 25 mg by mouth 2 (two) times daily with a meal. LUNCH & EVENING   Cholecalciferol (VITAMIN D3) 10 MCG (400 UNIT) CAPS Take 400 Units by mouth daily.   esomeprazole (NEXIUM) 40 MG capsule Take 1 capsule (40 mg total) by mouth daily before breakfast.   ferrous sulfate (FERROUSUL) 325 (65 FE) MG tablet Take 1 tablet (325 mg total) by mouth 2 (two) times daily. (Patient taking differently: Take 325 mg by mouth daily with breakfast.)   hydrALAZINE (APRESOLINE) 100 MG tablet Take 100 mg by mouth 2 (two) times daily.   hyoscyamine (LEVSIN SL) 0.125 MG SL tablet PLACE 1-2 TABLETS UNDER THE TONGUE 2 (TWO) TIMES DAILY AS NEEDED.   lamoTRIgine (LAMICTAL) 200 MG tablet Take 1 tablet (200 mg total) by mouth daily.   losartan (COZAAR) 100 MG tablet Take 100 mg by mouth daily.   meloxicam (MOBIC) 15 MG tablet Take 1 tablet (15 mg total) by mouth daily.   Omega-3 Fatty Acids (FISH OIL PO) Take 1 capsule by mouth in the morning and at bedtime.   ondansetron (ZOFRAN) 4 MG tablet TAKE 1 TABLET BY MOUTH EVERY 8 HOURS AS NEEDED FOR NAUSEA AND VOMITING (Patient taking differently: Take 4 mg by mouth daily.)   polyethylene glycol powder (GLYCOLAX/MIRALAX) 17 GM/SCOOP powder Take 17 grams daily prn for Constipation.   potassium  chloride (KLOR-CON) 10 MEQ tablet Take 10 mEq by mouth daily.   simvastatin (ZOCOR) 20 MG tablet Take 20 mg by mouth daily with lunch.   torsemide (DEMADEX) 20 MG tablet Take 20 mg by mouth every other day.   vitamin B-12 (CYANOCOBALAMIN) 1000 MCG tablet Take 1,000 mcg by mouth every evening.   zolpidem (AMBIEN) 10 MG tablet Take 1 tablet (10 mg total) by mouth at bedtime.   No facility-administered encounter medications on file as of 10/05/2020.     Objective: Blood pressure (!) 156/58, pulse 69, temperature 99.4 F (37.4 C), temperature source Oral, height $RemoveBefo'5\' 4"'MIjMuHAcufB$  (1.626 m), weight 192 lb 11.2  oz (87.4 kg). Patient is alert and in no acute distress. Conjunctiva is pink. Sclera is nonicteric Oropharyngeal mucosa is normal. No neck masses or thyromegaly noted. Cardiac exam with regular rhythm normal S1 and S2. No murmur or gallop noted. Lungs are clear to auscultation. Abdomen is full.  She has long midline scar.  Bowel sounds are normal.  She has small umbilical hernia which is completely reducible and nontender.  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  On palpation abdomen is soft.  She has mild tenderness in epigastric region.  No organomegaly or masses. She has trace edema around ankles.  Labs/studies Results:   CBC Latest Ref Rng & Units 03/24/2020 02/24/2020 01/20/2020  WBC 4.0 - 10.5 K/uL 4.7 5.7 5.3  Hemoglobin 12.0 - 15.0 g/dL 11.1(L) 8.6(L) 8.5(L)  Hematocrit 36.0 - 46.0 % 37.6 30.3(L) 29.0(L)  Platelets 150 - 400 K/uL 214 272 248    CMP Latest Ref Rng & Units 02/25/2020 02/24/2020 01/20/2020  Glucose 70 - 99 mg/dL - 96 104(H)  BUN 8 - 23 mg/dL - 21 15  Creatinine 0.44 - 1.00 mg/dL - 1.34(H) 1.17(H)  Sodium 135 - 145 mmol/L - 141 142  Potassium 3.5 - 5.1 mmol/L - 3.5 4.1  Chloride 98 - 111 mmol/L - 103 103  CO2 22 - 32 mmol/L - 27 26  Calcium 8.9 - 10.3 mg/dL - 8.9 9.0  Total Protein 6.5 - 8.1 g/dL 6.9 - 7.1  Total Bilirubin 0.3 - 1.2 mg/dL 0.6 - 0.8  Alkaline Phos 38 - 126 U/L 84 - 75  AST 15 - 41 U/L 17 - 18  ALT 0 - 44 U/L 13 - 12    Hepatic Function Latest Ref Rng & Units 02/25/2020 01/20/2020 04/28/2019  Total Protein 6.5 - 8.1 g/dL 6.9 7.1 7.1  Albumin 3.5 - 5.0 g/dL 3.3(L) 3.4(L) -  AST 15 - 41 U/L $Remo'17 18 17  'Zheqi$ ALT 0 - 44 U/L $Remo'13 12 13  'kcZtk$ Alk Phosphatase 38 - 126 U/L 84 75 -  Total Bilirubin 0.3 - 1.2 mg/dL 0.6 0.8 0.4  Bilirubin, Direct 0.0 - 0.2 mg/dL 0.1 - -      Assessment:  #1.  Chronic nausea.  She has had nausea for at least 7 years but it has become more pronounced and now occurring daily.  She is using ondansetron every day  which provides temporary relief.  She had very mild gastroparesis on emptying study of January 2017.  Wonder if gastroparesis has worsened.  However what is worth mentioning is that her nausea is never associated with vomiting.  EGD 6 months ago did not reveal any abnormality to account for her nausea and similarly abdominal pelvic CT in April last year failed to reveal any abnormality to account for the symptoms. I am reluctant to recommend low-dose  benzodiazepine given that she is on lamotrigine and aripiprazole. She is unable to come off meloxicam because of her arthritis.  However she does not have peptic ulcer disease. Therefore I wonder if nausea is central in origin or secondary to her bipolar disorder.   #2.  Chronic constipation.  She is doing well with dietary measures and as needed polyethylene glycol.  She needs to make sure that her bowels move at least every other day.  Goal is to prevent her from getting constipated.  #3.  History of iron deficiency anemia most likely due to impaired iron absorption due to chronic PPI therapy.  She responded to p.o. iron therapy which was started last fall.  She has not had recent H&H.   Plan:  CBC, serum iron TIBC and ferritin. LFTs. Patient will try taking ondansetron 8 mg around 4 AM daily for the next couple of weeks and see if she would wake up without nausea. Patient advised to take polyethylene glycol on days when she does not have a bowel movement. We will try her on domperidone if her [place].  I explained to them both that this is not FDA approved medicine but would be worth trying for nausea. Office visit in 4 months.

## 2020-10-07 ENCOUNTER — Encounter (INDEPENDENT_AMBULATORY_CARE_PROVIDER_SITE_OTHER): Payer: Self-pay | Admitting: *Deleted

## 2020-10-07 ENCOUNTER — Other Ambulatory Visit (INDEPENDENT_AMBULATORY_CARE_PROVIDER_SITE_OTHER): Payer: Self-pay | Admitting: *Deleted

## 2020-10-21 ENCOUNTER — Telehealth (INDEPENDENT_AMBULATORY_CARE_PROVIDER_SITE_OTHER): Payer: Self-pay | Admitting: Internal Medicine

## 2020-10-21 MED ORDER — LORAZEPAM 0.5 MG PO TABS: 0.5000 mg | ORAL_TABLET | Freq: Two times a day (BID) | ORAL | 0 refills | Status: DC | PRN

## 2020-10-21 NOTE — Telephone Encounter (Signed)
Patient's husband Mellody Dance called me yesterday to let me know that they were able to obtain domperidone for Lindsay Palmer but her cardiologist recommended not to use this medication. In the meantime I talk with Dr. Diannia Ruder patient's psychiatrist who is agreeable for me to use lorazepam as long as it does not make her sleepy. Will send prescription for 0.5 mg p.o. twice daily as needed 30 doses without refill.

## 2020-10-22 ENCOUNTER — Other Ambulatory Visit (INDEPENDENT_AMBULATORY_CARE_PROVIDER_SITE_OTHER): Payer: Self-pay | Admitting: Internal Medicine

## 2020-11-05 ENCOUNTER — Encounter (INDEPENDENT_AMBULATORY_CARE_PROVIDER_SITE_OTHER): Payer: Self-pay

## 2020-11-12 ENCOUNTER — Other Ambulatory Visit (INDEPENDENT_AMBULATORY_CARE_PROVIDER_SITE_OTHER): Payer: Self-pay | Admitting: Internal Medicine

## 2020-11-12 ENCOUNTER — Other Ambulatory Visit (INDEPENDENT_AMBULATORY_CARE_PROVIDER_SITE_OTHER): Payer: Self-pay | Admitting: Gastroenterology

## 2020-11-15 NOTE — Telephone Encounter (Signed)
Last office visit 10/05/20 for nausea, vomiting, ID Next visit 02/08/21

## 2020-11-16 ENCOUNTER — Telehealth (INDEPENDENT_AMBULATORY_CARE_PROVIDER_SITE_OTHER): Payer: Self-pay

## 2020-11-16 NOTE — Telephone Encounter (Signed)
Patient aware the medication has been sent to the pharmacy. She states yes the Lorazepam is helping with the nausea.

## 2020-11-16 NOTE — Telephone Encounter (Signed)
Lorazepam sent to local pharmacy. Per Dr.Rehman is this helping with nausea?

## 2020-11-18 NOTE — Telephone Encounter (Signed)
Dr.Rehman aware the Lorazepam is helping.

## 2020-12-20 ENCOUNTER — Telehealth (INDEPENDENT_AMBULATORY_CARE_PROVIDER_SITE_OTHER): Payer: Self-pay | Admitting: *Deleted

## 2020-12-20 NOTE — Telephone Encounter (Signed)
[phone] 

## 2020-12-20 NOTE — Telephone Encounter (Signed)
Pt's husband called and states he is looking her new drug plan and the one he wants covers all her meds except for hyoscyamine sl 0.125mg . I called back to see what meds were covered under that drug plan and he was not able to pull it up on website while on phone. I told pt I would call back tomorrow and get info.

## 2020-12-21 NOTE — Telephone Encounter (Signed)
Called and spoke with pt to see if they  were able to get the alternative meds that new drug plan would cover. She states they have not gotten that info yet and I told her I would check back tomorrow with them.

## 2020-12-31 NOTE — Telephone Encounter (Signed)
Pt's husband states the alternatives the insurance gave him was pnpoenmine, mesalamine, thiamine hydrochloride, dalantamine, scopolamine. Consult with dr Karilyn Cota and he would like patient to stay on hysocamine. I looked up price with [facility] and it was 13.85 for 30 tablets at [facility] and discussed with pt's husband that pt should stay on med and they could get through [facility] for $13.85.

## 2021-01-11 NOTE — Telephone Encounter (Signed)
error 

## 2021-01-27 ENCOUNTER — Other Ambulatory Visit (INDEPENDENT_AMBULATORY_CARE_PROVIDER_SITE_OTHER): Payer: Self-pay | Admitting: Internal Medicine

## 2021-01-27 NOTE — Telephone Encounter (Signed)
Last seen 10/05/20.

## 2021-01-28 ENCOUNTER — Other Ambulatory Visit (INDEPENDENT_AMBULATORY_CARE_PROVIDER_SITE_OTHER): Payer: Self-pay | Admitting: *Deleted

## 2021-01-28 DIAGNOSIS — Z79899 Other long term (current) drug therapy: Secondary | ICD-10-CM

## 2021-02-04 ENCOUNTER — Telehealth (INDEPENDENT_AMBULATORY_CARE_PROVIDER_SITE_OTHER): Payer: Self-pay | Admitting: *Deleted

## 2021-02-04 NOTE — Telephone Encounter (Signed)
Pt states she is getting her labs done in [place] at [facility] on 02/07/21.  I called [facility] in [place] to see if they would take [facility] order form and was told yes they would. Phone number to lab [phone]. Will hold on to message til we get lab results.

## 2021-02-08 ENCOUNTER — Encounter (INDEPENDENT_AMBULATORY_CARE_PROVIDER_SITE_OTHER): Payer: Self-pay | Admitting: Internal Medicine

## 2021-02-08 ENCOUNTER — Ambulatory Visit (INDEPENDENT_AMBULATORY_CARE_PROVIDER_SITE_OTHER): Payer: Medicare Other | Admitting: Internal Medicine

## 2021-02-08 ENCOUNTER — Other Ambulatory Visit: Payer: Self-pay

## 2021-02-08 VITALS — BP 164/44 | HR 67 | Temp 98.8°F | Ht 64.0 in | Wt 193.3 lb

## 2021-02-08 DIAGNOSIS — R110 Nausea: Secondary | ICD-10-CM | POA: Diagnosis not present

## 2021-02-08 DIAGNOSIS — D509 Iron deficiency anemia, unspecified: Secondary | ICD-10-CM | POA: Diagnosis not present

## 2021-02-08 DIAGNOSIS — Z79899 Other long term (current) drug therapy: Secondary | ICD-10-CM

## 2021-02-08 DIAGNOSIS — R635 Abnormal weight gain: Secondary | ICD-10-CM | POA: Insufficient documentation

## 2021-02-08 DIAGNOSIS — R1030 Lower abdominal pain, unspecified: Secondary | ICD-10-CM | POA: Diagnosis not present

## 2021-02-08 DIAGNOSIS — Z8639 Personal history of other endocrine, nutritional and metabolic disease: Secondary | ICD-10-CM | POA: Insufficient documentation

## 2021-02-08 DIAGNOSIS — E559 Vitamin D deficiency, unspecified: Secondary | ICD-10-CM | POA: Insufficient documentation

## 2021-02-08 DIAGNOSIS — R531 Weakness: Secondary | ICD-10-CM

## 2021-02-08 NOTE — Patient Instructions (Signed)
Please check with Dr. Diannia Ruder if she could take Phenergan 12-1/2 mg no more than twice a week for nausea. Physician will call with results of blood test when completed.

## 2021-02-08 NOTE — Telephone Encounter (Signed)
Patient came in office today and after visit went to [facility] to [facility]work.

## 2021-02-08 NOTE — Progress Notes (Signed)
Presenting complaint;  Nausea and lower abdominal pain.  Database and subjective:  Patient is 81 year old Caucasian female who is here for scheduled visit accompanied by her husband Lanny Hurst.  She was last seen in September 2022. 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  She had pancreatitis back in October 2015.  Secondary to contrast complicated by pseudocysts which resolved with conservative therapy.  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  She had anastomotic leak treated with antibiotics. She has chronic nausea which has not responded to therapy. Last year she was diagnosed with iron deficiency anemia.  She underwent EGD and colonoscopy in February 2022.   She had mild chronic gastritis with reactive changes and focal intestinal metaplasia.  H. pylori stains were negative.  Duodenal biopsy did not show any changes of celiac disease.  Colonoscopy revealed patent colorectal anastomosis and single sigmoid colon diverticulum.  She continues to feel poorly.  She has nausea virtually every day but she never vomits.  It can last for several hours to a whole day.  Ondansetron sometimes helps.  But she never gets relief.  She was given lorazepam after consultation with Dr. Levonne Spiller our psychiatrist but it did not make any difference. She continues to complain of intermittent lower abdominal pain.  At times it can be intense but is short-lived.  Pain is relieved with hyoscyamine.  On most days she has 1 formed stool but she can have periods of diarrhea and/or constipation.  She denies melena or rectal bleeding.  Her husband states that she eats very little and he is surprised that she is not losing weight. Lanny Hurst states she sleeps for 10 to 12 hours.  Rest of the time she is just sitting and may watch TV.  She states she went canceled 2  appointments with her hairdresser.  Current Medications: Outpatient Encounter Medications as of 02/08/2021  Medication Sig   acetaminophen (TYLENOL) 500 MG tablet Take 500-1,000 mg by mouth every 6 (six) hours as needed (pain).   ARIPiprazole (ABILIFY) 10 MG tablet Take 1 tablet (10 mg total) by mouth at bedtime.   carvedilol (COREG) 25 MG tablet Take 25 mg by mouth 2 (two) times daily with a meal. LUNCH & EVENING   Cholecalciferol (VITAMIN D3) 10 MCG (400 UNIT) CAPS Take 400 Units by mouth daily.   esomeprazole (NEXIUM) 40 MG capsule TAKE 1 CAPSULE BY MOUTH EVERY DAY BEFORE BREAKFAST   ferrous sulfate (FERROUSUL) 325 (65 FE) MG tablet Take 1 tablet (325 mg total) by mouth 2 (two) times daily. (Patient taking differently: Take 325 mg by mouth daily with breakfast.)   hydrALAZINE (APRESOLINE) 100 MG tablet Take 100 mg by mouth 2 (two) times daily.   hyoscyamine (LEVSIN SL) 0.125 MG SL tablet PLACE 1-2 TABLETS UNDER THE TONGUE 2 (TWO) TIMES DAILY AS NEEDED.   lamoTRIgine (LAMICTAL) 200 MG tablet Take 1 tablet (200 mg total) by mouth daily.   LORazepam (ATIVAN) 0.5 MG tablet TAKE 1 TABLET BY MOUTH 2 TIMES DAILY AS NEEDED FOR ANXIETY.   losartan (COZAAR) 100 MG tablet Take 100 mg by mouth daily.   meloxicam (MOBIC) 15 MG tablet Take 1 tablet (15 mg total) by mouth daily.   Omega-3 Fatty Acids (FISH OIL PO) Take 1 capsule by mouth in the morning and at bedtime.   ondansetron (ZOFRAN) 4 MG tablet TAKE 1 TABLET BY MOUTH  EVERY 8 HOURS AS NEEDED FOR NAUSEA AND VOMITING   polyethylene glycol powder (GLYCOLAX/MIRALAX) 17 GM/SCOOP powder Take 17 grams daily prn for Constipation.   potassium chloride (MICRO-K) 10 MEQ CR capsule TAKE 2 CAPSULES BY MOUTH DAILY AS NEEDED ( TAKE ON DAYS WHEN YOU TAKE TORSEMIDE)   simvastatin (ZOCOR) 20 MG tablet Take 20 mg by mouth daily with lunch.   torsemide (DEMADEX) 20 MG tablet Take 20 mg by mouth every other day.   vitamin B-12 (CYANOCOBALAMIN) 1000 MCG tablet Take 1,000  mcg by mouth every evening.   zolpidem (AMBIEN) 10 MG tablet Take 1 tablet (10 mg total) by mouth at bedtime.   NON FORMULARY DOMPERIDONE 10MG   TAKE ONE BID FOR 15 DAYS - ordered by Dr. Laural Golden on 10/05/20. [facility]. (Patient not taking: Reported on 02/08/2021)   [DISCONTINUED] albuterol (PROVENTIL) (2.5 MG/3ML) 0.083% nebulizer solution Take 2.5 mg by nebulization every 6 (six) hours as needed for wheezing or shortness of breath.   [DISCONTINUED] potassium chloride (KLOR-CON) 10 MEQ tablet Take 10 mEq by mouth daily.   No facility-administered encounter medications on file as of 02/08/2021.     Objective: Blood pressure (!) 164/44, pulse 67, temperature 98.8 F (37.1 C), temperature source Oral, height 5\' 4"  (1.626 m), weight 193 lb 4.8 oz (87.7 kg). Patient is alert and in no acute distress. Conjunctiva is pink. Sclera is nonicteric Oropharyngeal mucosa is normal. No neck masses or thyromegaly noted. Cardiac exam with regular rhythm normal S1 and S2. No murmur or gallop noted. Lungs are clear to auscultation. Abdomen is full.  She has long midline scar.  She has small umbilical hernia which is soft and nontender and reducible.  She has another horizontal scar in right mid abdomen.  On palpation abdomen is soft and nontender with organomegaly or masses. She has trace edema around ankles.  Labs/studies Results:   CBC Latest Ref Rng & Units 03/24/2020 02/24/2020 01/20/2020  WBC 4.0 - 10.5 K/uL 4.7 5.7 5.3  Hemoglobin 12.0 - 15.0 g/dL 11.1(L) 8.6(L) 8.5(L)  Hematocrit 36.0 - 46.0 % 37.6 30.3(L) 29.0(L)  Platelets 150 - 400 K/uL 214 272 248    CMP Latest Ref Rng & Units 02/25/2020 02/24/2020 01/20/2020  Glucose 70 - 99 mg/dL - 96 104(H)  BUN 8 - 23 mg/dL - 21 15  Creatinine 0.44 - 1.00 mg/dL - 1.34(H) 1.17(H)  Sodium 135 - 145 mmol/L - 141 142  Potassium 3.5 - 5.1 mmol/L - 3.5 4.1  Chloride 98 - 111 mmol/L - 103 103  CO2 22 - 32 mmol/L - 27 26  Calcium 8.9 - 10.3 mg/dL - 8.9 9.0   Total Protein 6.5 - 8.1 g/dL 6.9 - 7.1  Total Bilirubin 0.3 - 1.2 mg/dL 0.6 - 0.8  Alkaline Phos 38 - 126 U/L 84 - 75  AST 15 - 41 U/L 17 - 18  ALT 0 - 44 U/L 13 - 12    Hepatic Function Latest Ref Rng & Units 02/25/2020 01/20/2020 04/28/2019  Total Protein 6.5 - 8.1 g/dL 6.9 7.1 7.1  Albumin 3.5 - 5.0 g/dL 3.3(L) 3.4(L) -  AST 15 - 41 U/L 17 18 17   ALT 0 - 44 U/L 13 12 13   Alk Phosphatase 38 - 126 U/L 84 75 -  Total Bilirubin 0.3 - 1.2 mg/dL 0.6 0.8 0.4  Bilirubin, Direct 0.0 - 0.2 mg/dL 0.1 - -      Assessment:  #1.  Chronic nausea.  She has daily nausea but never  vomits.  She is getting relief with ondansetron.  Unfortunately she is not a candidate for Phenergan or similar medications since she is on psychotropic drugs.  Potential for side effects is very high.  She did have solid-phase gastric emptying study 6 years ago and was within normal limits.  EGD 1 year ago did not reveal peptic ulcer disease or pyloric stenosis.  It is quite possible that nausea is due to one of her medications or meloxicam.  She has not been able to come off meloxicam because of her back issues.  No new recommendations at this time.  #2.  History of iron deficiency anemia.  Suspect anemia secondary to poor iron absorption.  EGD and colonoscopy in February last year were negative.  She is due for blood work.  #3.  Lower abdominal pain felt to be due to IBS.  #4.  History of mildly elevated transaminases possibly due to fatty liver.  Plan:  Patient will go to the lab for CBC, serum iron TIBC ferritin and metabolic 7. We will also check vitamin D and TSH level. He can try low-dose promethazine but first will check with Dr. Levonne Spiller. Office visit in 4 months.

## 2021-02-09 LAB — BASIC METABOLIC PANEL
BUN/Creatinine Ratio: 26 (calc) — ABNORMAL HIGH (ref 6–22)
BUN: 44 mg/dL — ABNORMAL HIGH (ref 7–25)
CO2: 29 mmol/L (ref 20–32)
Calcium: 9.7 mg/dL (ref 8.6–10.4)
Chloride: 105 mmol/L (ref 98–110)
Creat: 1.71 mg/dL — ABNORMAL HIGH (ref 0.60–1.00)
Glucose, Bld: 94 mg/dL (ref 65–99)
Potassium: 5.2 mmol/L (ref 3.5–5.3)
Sodium: 141 mmol/L (ref 135–146)

## 2021-02-09 LAB — TSH: TSH: 1.38 mIU/L (ref 0.40–4.50)

## 2021-02-09 LAB — VITAMIN D 25 HYDROXY (VIT D DEFICIENCY, FRACTURES): Vit D, 25-Hydroxy: 50 ng/mL (ref 30–100)

## 2021-02-09 NOTE — Telephone Encounter (Signed)
error 

## 2021-02-11 ENCOUNTER — Emergency Department (HOSPITAL_COMMUNITY)
Admission: EM | Admit: 2021-02-11 | Discharge: 2021-02-11 | Disposition: A | Discharge status: Home / Self Care | Payer: Medicare Other | Attending: Emergency Medicine | Admitting: Emergency Medicine

## 2021-02-11 ENCOUNTER — Other Ambulatory Visit (INDEPENDENT_AMBULATORY_CARE_PROVIDER_SITE_OTHER): Payer: Self-pay

## 2021-02-11 ENCOUNTER — Telehealth (INDEPENDENT_AMBULATORY_CARE_PROVIDER_SITE_OTHER): Payer: Self-pay

## 2021-02-11 ENCOUNTER — Encounter (HOSPITAL_COMMUNITY): Payer: Self-pay | Admitting: *Deleted

## 2021-02-11 DIAGNOSIS — I10 Essential (primary) hypertension: Secondary | ICD-10-CM

## 2021-02-11 DIAGNOSIS — D509 Iron deficiency anemia, unspecified: Secondary | ICD-10-CM

## 2021-02-11 DIAGNOSIS — K920 Hematemesis: Secondary | ICD-10-CM

## 2021-02-11 DIAGNOSIS — R1033 Periumbilical pain: Secondary | ICD-10-CM

## 2021-02-11 DIAGNOSIS — D649 Anemia, unspecified: Secondary | ICD-10-CM

## 2021-02-11 LAB — COMPREHENSIVE METABOLIC PANEL
ALT: 24 U/L (ref 0–44)
AST: 24 U/L (ref 15–41)
Albumin: 3.8 g/dL (ref 3.5–5.0)
Alkaline Phosphatase: 75 U/L (ref 38–126)
Anion gap: 7 (ref 5–15)
BUN: 41 mg/dL — ABNORMAL HIGH (ref 8–23)
CO2: 24 mmol/L (ref 22–32)
Calcium: 8.8 mg/dL — ABNORMAL LOW (ref 8.9–10.3)
Chloride: 108 mmol/L (ref 98–111)
Creatinine, Ser: 1.61 mg/dL — ABNORMAL HIGH (ref 0.44–1.00)
GFR, Estimated: 32 mL/min — ABNORMAL LOW (ref >60)
Glucose, Bld: 137 mg/dL — ABNORMAL HIGH (ref 70–99)
Potassium: 4.9 mmol/L (ref 3.5–5.1)
Sodium: 139 mmol/L (ref 135–145)
Total Bilirubin: 0.6 mg/dL (ref 0.3–1.2)
Total Protein: 7.4 g/dL (ref 6.5–8.1)

## 2021-02-11 LAB — CBC WITH DIFFERENTIAL/PLATELET
Abs Immature Granulocytes: 0.02 10*3/uL (ref 0.00–0.07)
Basophils Absolute: 0 10*3/uL (ref 0.0–0.1)
Basophils Relative: 0 %
Eosinophils Absolute: 0 10*3/uL (ref 0.0–0.5)
Eosinophils Relative: 1 %
HCT: 35.8 % — ABNORMAL LOW (ref 36.0–46.0)
Hemoglobin: 12.1 g/dL (ref 12.0–15.0)
Immature Granulocytes: 1 %
Lymphocytes Relative: 20 %
Lymphs Abs: 0.8 10*3/uL (ref 0.7–4.0)
MCH: 34.2 pg — ABNORMAL HIGH (ref 26.0–34.0)
MCHC: 33.8 g/dL (ref 30.0–36.0)
MCV: 101.1 fL — ABNORMAL HIGH (ref 80.0–100.0)
Monocytes Absolute: 0.4 10*3/uL (ref 0.1–1.0)
Monocytes Relative: 11 %
Neutro Abs: 2.8 10*3/uL (ref 1.7–7.7)
Neutrophils Relative %: 67 %
Platelets: 176 10*3/uL (ref 150–400)
RBC: 3.54 MIL/uL — ABNORMAL LOW (ref 3.87–5.11)
RDW: 11.6 % (ref 11.5–15.5)
WBC: 4.1 10*3/uL (ref 4.0–10.5)
nRBC: 0 % (ref 0.0–0.2)

## 2021-02-11 LAB — HEMOGLOBIN: Hemoglobin: 11.9 g/dL — ABNORMAL LOW (ref 12.0–15.0)

## 2021-02-11 LAB — POC OCCULT BLOOD, ED: Stool Culture result 1 (CMPCXR): NEGATIVE

## 2021-02-11 MED ORDER — SODIUM CHLORIDE 0.9 % IV BOLUS
500.0000 mL | Freq: Once | INTRAVENOUS | Status: AC
  Administered 2021-02-11: 500 mL via INTRAVENOUS

## 2021-02-11 MED ORDER — PANTOPRAZOLE SODIUM 40 MG IV SOLR
40.0000 mg | Freq: Once | INTRAVENOUS | Status: AC
  Administered 2021-02-11: 40 mg via INTRAVENOUS
  Filled 2021-02-11: qty 40

## 2021-02-11 NOTE — ED Provider Notes (Signed)
[facility] EMERGENCY DEPARTMENT Provider Note   CSN: [id number] Arrival date & time: 02/11/21  1051     History  Chief Complaint  Patient presents with   Melena    Lindsay Palmer is a 81 y.o. female with a longstanding history of chronic nausea and IBS who presents the emergency department with chief complaint of black vomitus.  Patient states that she ate a biscuit with gravy from [employer] today and got sick to her stomach and then had black tarry vomit.  She denies coffee-ground emesis.  She has not been having any abdominal pain as of recent.  She follows with Dr. Laural Golden who recommends the patient presents sent to the emergency department for further evaluation.  She denies any known melena or hematochezia but is on iron.  She denies fevers, chills, dizziness with standing  HPI     Home Medications Prior to Admission medications   Medication Sig Start Date End Date Taking? Authorizing Provider  ARIPiprazole (ABILIFY) 10 MG tablet Take 1 tablet (10 mg total) by mouth at bedtime. 09/06/20  Yes Cloria Spring, MD  carvedilol (COREG) 25 MG tablet Take 25 mg by mouth 2 (two) times daily with a meal. LUNCH & EVENING 12/10/19  Yes [provider]  Cholecalciferol (VITAMIN D3) 10 MCG (400 UNIT) CAPS Take 400 Units by mouth daily.   Yes [provider]  esomeprazole (NEXIUM) 40 MG capsule TAKE 1 CAPSULE BY MOUTH EVERY DAY BEFORE BREAKFAST 10/22/20  Yes Rehman, Mechele Dawley, MD  ferrous sulfate (FERROUSUL) 325 (65 FE) MG tablet Take 1 tablet (325 mg total) by mouth 2 (two) times daily. Patient taking differently: Take 325 mg by mouth daily with breakfast. 02/26/20  Yes Rehman, Mechele Dawley, MD  hydrALAZINE (APRESOLINE) 100 MG tablet Take 100 mg by mouth 2 (two) times daily.   Yes [provider]  hyoscyamine (LEVSIN SL) 0.125 MG SL tablet PLACE 1-2 TABLETS UNDER THE TONGUE 2 (TWO) TIMES DAILY AS NEEDED. 04/02/20  Yes Rehman, Mechele Dawley, MD  lamoTRIgine (LAMICTAL) 200 MG tablet  Take 1 tablet (200 mg total) by mouth daily. 09/06/20  Yes Cloria Spring, MD  losartan (COZAAR) 100 MG tablet Take 100 mg by mouth daily. 04/05/19  Yes [provider]  meloxicam (MOBIC) 15 MG tablet Take 1 tablet (15 mg total) by mouth daily. 03/25/20  Yes Rehman, Mechele Dawley, MD  Omega-3 Fatty Acids (FISH OIL PO) Take 1 capsule by mouth in the morning and at bedtime.   Yes [provider]  ondansetron (ZOFRAN) 4 MG tablet TAKE 1 TABLET BY MOUTH EVERY 8 HOURS AS NEEDED FOR NAUSEA AND VOMITING 11/15/20  Yes Rehman, Mechele Dawley, MD  polyethylene glycol powder (GLYCOLAX/MIRALAX) 17 GM/SCOOP powder Take 17 grams daily prn for Constipation. 09/21/20  Yes Rehman, Mechele Dawley, MD  potassium chloride (MICRO-K) 10 MEQ CR capsule TAKE 2 CAPSULES BY MOUTH DAILY AS NEEDED ( TAKE ON DAYS WHEN YOU TAKE TORSEMIDE) 01/27/21  Yes Rehman, Mechele Dawley, MD  simvastatin (ZOCOR) 20 MG tablet Take 20 mg by mouth daily with lunch.   Yes [provider]  torsemide (DEMADEX) 20 MG tablet Take 20 mg by mouth every other day. 12/10/19  Yes [provider]  vitamin B-12 (CYANOCOBALAMIN) 1000 MCG tablet Take 1,000 mcg by mouth every evening.   Yes [provider]  zolpidem (AMBIEN) 10 MG tablet Take 1 tablet (10 mg total) by mouth at bedtime. 09/06/20  Yes Cloria Spring, MD  acetaminophen (TYLENOL) 500  MG tablet Take 500-1,000 mg by mouth every 6 (six) hours as needed (pain).    [provider]  LORazepam (ATIVAN) 0.5 MG tablet TAKE 1 TABLET BY MOUTH 2 TIMES DAILY AS NEEDED FOR ANXIETY. 11/16/20   Rehman, Mechele Dawley, MD  NON FORMULARY DOMPERIDONE 10MG   TAKE ONE BID FOR 15 DAYS - ordered by Dr. Laural Golden on 10/05/20. [facility]. Patient not taking: Reported on 02/08/2021    [provider]      Allergies    Codeine, Ciprofloxacin, Flagyl [metronidazole], and Oxycodone    Review of Systems   Review of Systems As per the HPI Physical Exam Updated Vital Signs BP (!) 144/71     Pulse 81    Temp 98.2 F (36.8 C)    Resp 18    SpO2 98%  Physical Exam Vitals and nursing note reviewed.  Constitutional:      General: She is not in acute distress.    Appearance: She is well-developed. She is not diaphoretic.  HENT:     Head: Normocephalic and atraumatic.     Right Ear: External ear normal.     Left Ear: External ear normal.     Nose: Nose normal.     Mouth/Throat:     Mouth: Mucous membranes are moist.  Eyes:     General: No scleral icterus.    Conjunctiva/sclera: Conjunctivae normal.  Cardiovascular:     Rate and Rhythm: Normal rate and regular rhythm.     Heart sounds: Normal heart sounds. No murmur heard.   No friction rub. No gallop.  Pulmonary:     Effort: Pulmonary effort is normal. No respiratory distress.     Breath sounds: Normal breath sounds.  Abdominal:     General: Bowel sounds are normal. There is no distension.     Palpations: Abdomen is soft. There is no mass.     Tenderness: There is no abdominal tenderness. There is no guarding.  Musculoskeletal:     Cervical back: Normal range of motion.  Skin:    General: Skin is warm and dry.  Neurological:     Mental Status: She is alert and oriented to person, place, and time.  Psychiatric:        Behavior: Behavior normal.    ED Results / Procedures / Treatments   Labs (all labs ordered are listed, but only abnormal results are displayed) Labs Reviewed  CBC WITH DIFFERENTIAL/PLATELET - Abnormal; Notable for the following components:      Result Value   RBC 3.54 (*)    HCT 35.8 (*)    MCV 101.1 (*)    MCH 34.2 (*)    All other components within normal limits  COMPREHENSIVE METABOLIC PANEL - Abnormal; Notable for the following components:   Glucose, Bld 137 (*)    BUN 41 (*)    Creatinine, Ser 1.61 (*)    Calcium 8.8 (*)    GFR, Estimated 32 (*)    All other components within normal limits  HEMOGLOBIN - Abnormal; Notable for the following components:   Hemoglobin 11.9 (*)    All  other components within normal limits  POC OCCULT BLOOD, ED    EKG None  Radiology No results found.  Procedures Procedures    Medications Ordered in ED Medications  sodium chloride 0.9 % bolus 500 mL (0 mLs Intravenous Stopped 02/11/21 1405)  pantoprazole (PROTONIX) injection 40 mg (40 mg Intravenous Given 02/11/21 1258)    ED Course/ Medical Decision Making/  A&P Clinical Course as of 02/11/21 1947  Fri Feb 11, 2021  1158 BUN(!): 41 [AH]  1158 Creatinine(!): 1.61 BUN / Cr at baselin [AH]  1158 Hemoglobin: 12.1 Normal hgb [AH]  1344 Case discussed with Dr. Laural Golden via phone- I reviewed hx, labs and work up. Agree that I will repeat HGB to make sure it is stable. If so, patient may be discharged. [AH]    Clinical Course User Index [AH] Margarita Mail, PA-C                           Medical Decision Making 81 year old female here with dark emesis.  No vomiting here in the emergency department I ordered and reviewed labs that included CBC which shows hemoglobin of 12.1 initially and then 11.9 on repeat essentially no difference, CMP with baseline renal insufficiency, negative occult stool.  Patient does not appear to have any active GI bleed.  She is hemodynamically stable.  Case discussed with Dr. Laural Golden who will see her in outpatient follow-up and agrees with work-up  Amount and/or Complexity of Data Reviewed Independent Historian: spouse Labs: ordered. Discussion of management or test interpretation with external provider(s): Outpatient GI specialist    Final Clinical Impression(s) / ED Diagnoses Final diagnoses:  Dark emesis    Rx / DC Orders ED Discharge Orders     None         Margarita Mail, PA-C 02/11/21 1947    Milton Ferguson, MD 02/12/21 1126

## 2021-02-11 NOTE — ED Triage Notes (Signed)
States she had a black tarry stool today and was referred by GI, VOMITED ONCE

## 2021-02-11 NOTE — Discharge Instructions (Addendum)
Get help right away if: You faint. You feel weak or dizzy. You are urinating less than normal or not at all. You vomit up: Large amounts of blood or dark material that may look like coffee grounds. Bright red blood. You have any of the following: Persistent vomiting. A rapid heartbeat. Blood in your stool. Chest pain. Difficulty breathing. 

## 2021-02-11 NOTE — Telephone Encounter (Signed)
Patient husband aware and will take patient to the hospital right away. I asked that he please call us Monday with an update, as we will need to know whether Dr. Karilyn Cota still wants the labs we ordered since patient went to Hospital. If so The patient husband wants all labs coordinated with Dr. Nancy Nordmann.

## 2021-02-11 NOTE — Telephone Encounter (Signed)
Per Dr. Karilyn Cota patient needs repeat labs next week around 02/17/2021. Per Dr. Karilyn Cota patient will need CBC, Iron,Tibc Ferr,Bmet and Hepatic panel done. I have placed the orders in Epic and printed them and placed at the front desk for the patient to pick up prior to blood draw at [facility].

## 2021-02-11 NOTE — Telephone Encounter (Signed)
Per Dr. Karilyn Cota patient should go to the ED as she takes Meloxicam.  Patient called back this am stating after eating a gravy biscuit this morning she vomited up a black tarry substance. No fevers no abdominal pains as of now,but did have them prior to vomiting.(Per husband if labs are needing to be done patient has an appt at Dr. Anthoney Harada office and would like the lab coordinated with them)

## 2021-02-14 ENCOUNTER — Telehealth (INDEPENDENT_AMBULATORY_CARE_PROVIDER_SITE_OTHER): Payer: Self-pay | Admitting: *Deleted

## 2021-02-14 NOTE — Telephone Encounter (Signed)
Husband called and stated that she ended up in ER and had lots of labs done Thursday however she did not go to get labs Dr. Karilyn Cota ordered.   They were told to call here and get next step done?????  Can you call him back at [phone]

## 2021-02-14 NOTE — Telephone Encounter (Signed)
Husband called. He states she is doing better since being home from ED. Has not had any more vomiting.   Dr. Karilyn Cota wanted pt to have bmp, liver, cbc, iron, tibc and ferritin.  Hospital did cmp and cbc.   Husband advised I will get with dr Karilyn Cota and see if she needs anything additional and let him know.

## 2021-02-14 NOTE — Telephone Encounter (Signed)
Called and spoke with husband. See other patient call from today

## 2021-02-16 NOTE — Telephone Encounter (Signed)
Per dr Karilyn Cota hemoglobin was good in hospital 12.1 so no need in doing any bloodwork for him at this time. Pt called and notified.

## 2021-02-17 ENCOUNTER — Telehealth (INDEPENDENT_AMBULATORY_CARE_PROVIDER_SITE_OTHER): Payer: Self-pay | Admitting: *Deleted

## 2021-02-17 NOTE — Telephone Encounter (Signed)
Pt's husband states he was checking on rx for domperidone. Also if she can take phenergan ( note states to check with dr Harrington Challenger) will let him know that. Checking on status of referral to [facility] (message sent to ann) and if she needed bw and ct scan. I talked with dr Laural Golden this week and he states she does not need any bloodwork. I did not see anything in the notes about ct scan. I did let husband know that she did not need bw for dr Laural Golden and I would askabout ct scan. (Husband states he may be mistaken because she has had a lot going on)

## 2021-02-17 NOTE — Telephone Encounter (Signed)
Pt's husband called and states she has not heard back about appt at [facility] and it has been a couple of months.   [phone]

## 2021-02-18 NOTE — Telephone Encounter (Signed)
Called and spoke with husband and let him know what note from 9/22 said about domperidone. And he states last visit dr Laural Golden told him he was going to do some research and see if she could take it. And also I could not find anything in her chart about a ct scan and I let husband know that I would ask dr Laural Golden when he is back in office next week.

## 2021-02-18 NOTE — Telephone Encounter (Signed)
Lindsay Palmer spoke with patient about referral to Lindsay Palmer and they have her scheduled 08/17/21 and pt was notified.   Copied dr Olevia Perches note from 10/21/20 below Patient's husband Lindsay Palmer called me yesterday to let me know that they were able to obtain domperidone for Lindsay Palmer but her cardiologist recommended not to use this medication.

## 2021-02-18 NOTE — Telephone Encounter (Signed)
Spoke to [place] at [facility], patient is scheduled for 08/17/21 at 920 am with Dr Chales Abrahams, they will keep her on cancellation list. Lindsay Palmer is aware.

## 2021-02-23 NOTE — Telephone Encounter (Signed)
Lindsay Palmer please see message below about ct scan. Patient states she cannot do the 7th, 8th, or the 14th.

## 2021-02-23 NOTE — Telephone Encounter (Signed)
Per dr Karilyn Cota - order ct abdomen and pelvis without IV contrast. Dx: lower abdominal pain. Also try otc emetrol bid. Takes when she wakes up and before bedtime. I called and discussed with pt. She verbalized understanding of all and does want to do CT abdomen/pelvis without IV contrast. And she states she will try emetrol otc.

## 2021-02-24 ENCOUNTER — Other Ambulatory Visit (INDEPENDENT_AMBULATORY_CARE_PROVIDER_SITE_OTHER): Payer: Self-pay

## 2021-02-24 DIAGNOSIS — R1030 Lower abdominal pain, unspecified: Secondary | ICD-10-CM

## 2021-03-03 ENCOUNTER — Telehealth (INDEPENDENT_AMBULATORY_CARE_PROVIDER_SITE_OTHER): Payer: Self-pay | Admitting: Internal Medicine

## 2021-03-03 NOTE — Telephone Encounter (Signed)
I went over CT results with with patient. CT does not reveal any acute abnormalities to account for her intermittent lower abdominal pain.  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 CT abdomen and pelvis from April 2021 did not reveal any lung nodule or stranding.  Therefore will request DVD so this study can be compared with prior study. Patient states she remains with intermittent nausea.  Emetrol did not help.  She is using ondansetron.  Will change ondansetron dose to 8 mg as well as refills. Patient should have chest CT in 12 months.

## 2021-03-08 ENCOUNTER — Telehealth (INDEPENDENT_AMBULATORY_CARE_PROVIDER_SITE_OTHER): Payer: Medicare Other | Admitting: Psychiatry

## 2021-03-08 ENCOUNTER — Encounter (HOSPITAL_COMMUNITY): Payer: Self-pay | Admitting: Psychiatry

## 2021-03-08 ENCOUNTER — Other Ambulatory Visit: Payer: Self-pay

## 2021-03-08 DIAGNOSIS — F3130 Bipolar disorder, current episode depressed, mild or moderate severity, unspecified: Secondary | ICD-10-CM

## 2021-03-08 MED ORDER — PROMETHAZINE HCL 12.5 MG PO TABS: 12.5000 mg | ORAL_TABLET | Freq: Three times a day (TID) | ORAL | 2 refills | Status: DC | PRN

## 2021-03-08 MED ORDER — LORAZEPAM 0.5 MG PO TABS: 0.5000 mg | ORAL_TABLET | Freq: Two times a day (BID) | ORAL | 2 refills | Status: DC | PRN

## 2021-03-08 MED ORDER — ZOLPIDEM TARTRATE 10 MG PO TABS: 10.0000 mg | ORAL_TABLET | Freq: Every day | ORAL | 2 refills | Status: DC

## 2021-03-08 MED ORDER — LAMOTRIGINE 200 MG PO TABS: 200.0000 mg | ORAL_TABLET | Freq: Every day | ORAL | 2 refills | Status: DC

## 2021-03-08 MED ORDER — ARIPIPRAZOLE 10 MG PO TABS
10.0000 mg | ORAL_TABLET | Freq: Every day | ORAL | 2 refills | Status: DC
Start: 2021-03-08 — End: 2021-09-05

## 2021-03-08 NOTE — Progress Notes (Signed)
Virtual Visit via Telephone Note  I connected with Lindsay Palmer on 03/08/21 at  1:20 PM EST by telephone and verified that I am speaking with the correct person using two identifiers.  Location: Patient: home Provider: office   I discussed the limitations, risks, security and privacy concerns of performing an evaluation and management service by telephone and the availability of in person appointments. I also discussed with the patient that there may be a patient responsible charge related to this service. The patient expressed understanding and agreed to proceed.     I discussed the assessment and treatment plan with the patient. The patient was provided an opportunity to ask questions and all were answered. The patient agreed with the plan and demonstrated an understanding of the instructions.   The patient was advised to call back or seek an in-person evaluation if the symptoms worsen or if the condition fails to improve as anticipated.  I provided 12 minutes of non-face-to-face time during this encounter.   Levonne Spiller, MD  [facility] MD/PA/NP OP Progress Note  03/08/2021 1:47 PM Lindsay Palmer  MRN:  [id number]  Chief Complaint:  Chief Complaint   Depression; Manic Behavior; Follow-up    HPI: This patient is a 81 year old married white female who lives with her husband in [place].  She is retired from [employer].  She has 1 son, 1 daughter and 3 grandchildren.  The patient returns for follow-up after 6 months.  She continues to have trouble with abdominal pain and nausea.  All of her studies so far have been negative.  She is scheduled to have an abdominal CT scan.  She states she does not have the symptoms every day but when they happen she gets pretty sick.  She is on Zofran but its not helping.  She asked about promethazine.  I am okay with her taking this at a low dose infrequently but it is similar to the Abilify and can have additive side effects so I would like her  to use it sparingly.  In terms of mood she continues to do well.  She denies being depressed suicidal and denies manic symptoms such as racing thoughts irritability anger irrationality.  At times she has trouble sleeping but usually can get up and watch TV and then go back to sleep. Visit Diagnosis:    ICD-10-CM   1. Bipolar I disorder, most recent episode depressed ([place])  F31.30       Past Psychiatric History: Long-term outpatient treatment for bipolar disorder  Past Medical History:  Past Medical History:  Diagnosis Date   Bipolar 2 disorder ([place])    Chronic insomnia    Congestive heart failure (CHF) ([place]) 12/2019   Diverticulosis of colon    Dyspnea    Elevated LFTs    Fatty liver disease, nonalcoholic    GERD (gastroesophageal reflux disease)    H/O first degree atrioventricular block    Hypercholesteremia    Hypertension    Major depressive disorder    Migraine    Osteopenia    Pancreatitis    Pneumonia    Sleep apnea    Stop Bang score of 4. Pt said she had sleep apnea at one time, but she has had another sleep study since then and they said she does not have it anymore.   Vitamin D deficiency     Past Surgical History:  Procedure Laterality Date   BILIARY STENT PLACEMENT N/A 07/18/2013   Procedure: BILIARY STENT PLACEMENT;  Surgeon:  Malissa HippoNajeeb U Rehman, MD;  Location: [facility];  Service: Endoscopy;  Laterality: N/A;   BIOPSY  03/24/2020   Procedure: BIOPSY;  Surgeon: Malissa Hippoehman, Najeeb U, MD;  Location: [facility];  Service: Endoscopy;;   Cataract Surgery Bilateral 08-2012   CHOLECYSTECTOMY     COLONOSCOPY  2003   Diverticulitis h/o polyps -due next 2015 -gastroscopy 2003   COLONOSCOPY WITH PROPOFOL N/A 03/24/2020   Procedure: COLONOSCOPY WITH PROPOFOL;  Surgeon: Malissa Hippoehman, Najeeb U, MD;  [facility]: [facility];  Service: Endoscopy;  Laterality: N/A;  9:00, pt tested + 1/25 <90 days   ERCP     ERCP N/A 07/18/2013   Procedure: ENDOSCOPIC RETROGRADE CHOLANGIOPANCREATOGRAPHY  (ERCP) COMMON BILE DUCT BRUSHING;  Surgeon: Malissa HippoNajeeb U Rehman, MD;  Location: [facility];  Service: Endoscopy;  Laterality: N/A;   ERCP N/A 11/07/2013   Procedure: ENDOSCOPIC RETROGRADE CHOLANGIOPANCREATOGRAPHY (ERCP);  Surgeon: Malissa HippoNajeeb U Rehman, MD;  Location: [facility];  Service: Endoscopy;  Laterality: N/A;   ESOPHAGEAL DILATION N/A 08/23/2016   Procedure: ESOPHAGEAL DILATION;  Surgeon: Malissa Hippoehman, Najeeb U, MD;  [facility];  Service: Endoscopy;  Laterality: N/A;   ESOPHAGOGASTRODUODENOSCOPY N/A 08/23/2016   Procedure: ESOPHAGOGASTRODUODENOSCOPY (EGD);  Surgeon: Malissa Hippoehman, Najeeb U, MD;  [facility];  Service: Endoscopy;  Laterality: N/A;  245-moved up to 100 per Dewayne HatchAnn   ESOPHAGOGASTRODUODENOSCOPY (EGD) WITH PROPOFOL N/A 03/24/2020   Procedure: ESOPHAGOGASTRODUODENOSCOPY (EGD) WITH PROPOFOL;  Surgeon: Malissa Hippoehman, Najeeb U, MD;  [facility];  Service: Endoscopy;  Laterality: N/A;   EYE SURGERY Bilateral    cataract removal   NECK SURGERY     Stenosis C6-C7   PARTIAL COLECTOMY  2018   SIGMOIDOSCOPY  2007   no polyps   SPHINCTEROTOMY N/A 07/18/2013   Procedure: SPHINCTEROTOMY;  Surgeon: Malissa HippoNajeeb U Rehman, MD;  Location: [facility];  Service: Endoscopy;  Laterality: N/A;   SPHINCTEROTOMY  11/07/2013   Procedure: EXTENSION OF SPHINCTEROTOMY;  Surgeon: Malissa HippoNajeeb U Rehman, MD;  Location: [facility];  Service: Endoscopy;;   STENT REMOVAL N/A 11/07/2013   Procedure: STENT REMOVAL;  Surgeon: Malissa HippoNajeeb U Rehman, MD;  Location: [facility];  Service: Endoscopy;  Laterality: N/A;    Family Psychiatric History: see below  Family History:  Family History  Problem Relation Age of Onset   Diabetes Mother    Coronary artery disease Father    Gout Father    Breast cancer Sister    Pancreatic cancer Brother    Alcohol abuse Brother    Healthy Brother    Lung cancer Sister    Healthy Sister    Depression Sister    Hyperlipidemia Daughter    Hyperlipidemia Son    Bipolar disorder Son    Healthy Sister     Healthy Sister    Seizures Other     Social History:  Social History   Socioeconomic History   Marital status: Married    Spouse name: Not on file   Number of children: Not on file   Years of education: Not on file   Highest education level: Not on file  Occupational History   Not on file  Tobacco Use   Smoking status: Never   Smokeless tobacco: Never  Vaping Use   Vaping Use: Never used  Substance and Sexual Activity   Alcohol use: No    Alcohol/week: 0.0 standard drinks   Drug use: No   Sexual activity: Not on file  Other Topics Concern   Not on file  Social History  Narrative   Not on file   Social Determinants of Health   Financial Resource Strain: Not on file  Food Insecurity: Not on file  Transportation Needs: Not on file  Physical Activity: Not on file  Stress: Not on file  Social Connections: Not on file    Allergies:  Allergies  Allergen Reactions   Codeine Nausea And Vomiting   Ciprofloxacin Nausea And Vomiting   Flagyl [Metronidazole] Nausea And Vomiting   Oxycodone Nausea And Vomiting    Metabolic Disorder Labs: No results found for: HGBA1C, MPG No results found for: PROLACTIN Lab Results  Component Value Date   TRIG 113 07/28/2013   Lab Results  Component Value Date   TSH 1.38 02/08/2021   TSH 1.213 09/14/2014    Therapeutic Level Labs: No results found for: LITHIUM No results found for: VALPROATE No components found for:  CBMZ  Current Medications: Current Outpatient Medications  Medication Sig Dispense Refill   promethazine (PHENERGAN) 12.5 MG tablet Take 1 tablet (12.5 mg total) by mouth every 8 (eight) hours as needed for nausea or vomiting. 30 tablet 2   acetaminophen (TYLENOL) 500 MG tablet Take 500-1,000 mg by mouth every 6 (six) hours as needed (pain).     ARIPiprazole (ABILIFY) 10 MG tablet Take 1 tablet (10 mg total) by mouth at bedtime. 90 tablet 2   carvedilol (COREG) 25 MG tablet Take 25 mg by mouth 2 (two) times daily  with a meal. LUNCH & EVENING     Cholecalciferol (VITAMIN D3) 10 MCG (400 UNIT) CAPS Take 400 Units by mouth daily.     esomeprazole (NEXIUM) 40 MG capsule TAKE 1 CAPSULE BY MOUTH EVERY DAY BEFORE BREAKFAST 90 capsule 1   ferrous sulfate (FERROUSUL) 325 (65 FE) MG tablet Take 1 tablet (325 mg total) by mouth 2 (two) times daily. (Patient taking differently: Take 325 mg by mouth daily with breakfast.)  0   hydrALAZINE (APRESOLINE) 100 MG tablet Take 100 mg by mouth 2 (two) times daily.     hyoscyamine (LEVSIN SL) 0.125 MG SL tablet PLACE 1-2 TABLETS UNDER THE TONGUE 2 (TWO) TIMES DAILY AS NEEDED. 120 tablet 2   lamoTRIgine (LAMICTAL) 200 MG tablet Take 1 tablet (200 mg total) by mouth daily. 90 tablet 2   LORazepam (ATIVAN) 0.5 MG tablet Take 1 tablet (0.5 mg total) by mouth 2 (two) times daily as needed for anxiety. 60 tablet 2   losartan (COZAAR) 100 MG tablet Take 100 mg by mouth daily.     meloxicam (MOBIC) 15 MG tablet Take 1 tablet (15 mg total) by mouth daily.     NON FORMULARY DOMPERIDONE 10MG   TAKE ONE BID FOR 15 DAYS - ordered by Dr. Laural Golden on 10/05/20. [facility]. (Patient not taking: Reported on 02/08/2021)     Omega-3 Fatty Acids (FISH OIL PO) Take 1 capsule by mouth in the morning and at bedtime.     ondansetron (ZOFRAN) 4 MG tablet TAKE 1 TABLET BY MOUTH EVERY 8 HOURS AS NEEDED FOR NAUSEA AND VOMITING 60 tablet 4   polyethylene glycol powder (GLYCOLAX/MIRALAX) 17 GM/SCOOP powder Take 17 grams daily prn for Constipation. 1581 g 5   potassium chloride (MICRO-K) 10 MEQ CR capsule TAKE 2 CAPSULES BY MOUTH DAILY AS NEEDED ( TAKE ON DAYS WHEN YOU TAKE TORSEMIDE) 180 capsule 0   simvastatin (ZOCOR) 20 MG tablet Take 20 mg by mouth daily with lunch.     torsemide (DEMADEX) 20 MG tablet Take 20 mg by mouth  every other day.     vitamin B-12 (CYANOCOBALAMIN) 1000 MCG tablet Take 1,000 mcg by mouth every evening.     zolpidem (AMBIEN) 10 MG tablet Take 1 tablet (10 mg total) by mouth at  bedtime. 90 tablet 2   No current facility-administered medications for this visit.     Musculoskeletal: Strength & Muscle Tone: na Gait & Station: na Patient leans: N/A  Psychiatric Specialty Exam: Review of Systems  Gastrointestinal:  Positive for abdominal pain and nausea.  All other systems reviewed and are negative.  There were no vitals taken for this visit.There is no height or weight on file to calculate BMI.  General Appearance: NA  Eye Contact:  NA  Speech:  Clear and Coherent  Volume:  Normal  Mood:  Euthymic  Affect:  NA  Thought Process:  Goal Directed  Orientation:  Full (Time, Place, and Person)  Thought Content: WDL   Suicidal Thoughts:  No  Homicidal Thoughts:  No  Memory:  Immediate;   Good Recent;   Good Remote;   Good  Judgement:  Good  Insight:  Fair  Psychomotor Activity:  Decreased  Concentration:  Concentration: Good and Attention Span: Good  Recall:  Good  Fund of Knowledge: Good  Language: Good  Akathisia:  No  Handed:  Right  AIMS (if indicated): not done  Assets:  Communication Skills Desire for Improvement Resilience Social Support Talents/Skills  ADL's:  Intact  Cognition: WNL  Sleep:  Fair   Screenings: PHQ2-9    Flowsheet Row Video Visit from 03/08/2021 in [facility] Video Visit from 09/06/2020 in [facility]  PHQ-2 Total Score 0 1      Flowsheet Row Video Visit from 03/08/2021 in [facility] from 02/11/2021 in [facility] Video Visit from 09/06/2020 in [facility] No Risk No Risk No Risk        Assessment and Plan: This patient is a 81 year old female with a history of bipolar disorder.  She remained stable on her current regimen.  I have agreed for her to have a low-dose of Phenergan-12.5 mg once or twice a day  only as needed.  She should not take it with Abilify.  She will continue Abilify 10 mg at bedtime as well as Lamictal 200 mg daily for mood stabilization Ambien 10 mg daily for sleep lorazepam 0.5 mg twice daily as needed for anxiety.  She will return to see me in 6 months or call sooner as needed   Levonne Spiller, MD 03/08/2021, 1:47 PM

## 2021-03-09 ENCOUNTER — Telehealth (HOSPITAL_COMMUNITY): Payer: Medicare Other | Admitting: Psychiatry

## 2021-03-18 ENCOUNTER — Encounter (INDEPENDENT_AMBULATORY_CARE_PROVIDER_SITE_OTHER): Payer: Self-pay

## 2021-04-21 ENCOUNTER — Other Ambulatory Visit (INDEPENDENT_AMBULATORY_CARE_PROVIDER_SITE_OTHER): Payer: Self-pay | Admitting: Internal Medicine

## 2021-04-21 NOTE — Telephone Encounter (Signed)
Potassium 2 months ago 4.9. last seen 02/08/21 ?

## 2021-09-05 ENCOUNTER — Telehealth (INDEPENDENT_AMBULATORY_CARE_PROVIDER_SITE_OTHER): Payer: Medicare Other | Admitting: Psychiatry

## 2021-09-05 ENCOUNTER — Encounter (HOSPITAL_COMMUNITY): Payer: Self-pay | Admitting: Psychiatry

## 2021-09-05 ENCOUNTER — Other Ambulatory Visit (INDEPENDENT_AMBULATORY_CARE_PROVIDER_SITE_OTHER): Payer: Self-pay | Admitting: Internal Medicine

## 2021-09-05 ENCOUNTER — Telehealth (HOSPITAL_COMMUNITY): Payer: Self-pay | Admitting: *Deleted

## 2021-09-05 DIAGNOSIS — F3130 Bipolar disorder, current episode depressed, mild or moderate severity, unspecified: Secondary | ICD-10-CM | POA: Diagnosis not present

## 2021-09-05 MED ORDER — LAMOTRIGINE 200 MG PO TABS: 200.0000 mg | ORAL_TABLET | Freq: Every day | ORAL | 2 refills | Status: DC

## 2021-09-05 MED ORDER — TRAZODONE HCL 50 MG PO TABS: 50.0000 mg | ORAL_TABLET | Freq: Every day | ORAL | 2 refills | Status: DC

## 2021-09-05 MED ORDER — ARIPIPRAZOLE 10 MG PO TABS
10.0000 mg | ORAL_TABLET | Freq: Every day | ORAL | 2 refills | Status: DC
Start: 2021-09-05 — End: 2022-03-06

## 2021-09-05 MED ORDER — LORAZEPAM 0.5 MG PO TABS: 0.5000 mg | ORAL_TABLET | Freq: Two times a day (BID) | ORAL | 5 refills | Status: DC | PRN

## 2021-09-05 NOTE — Progress Notes (Signed)
Virtual Visit via Telephone Note  I connected with Lindsay Palmer on 09/05/21 at  2:00 PM EDT by telephone and verified that I am speaking with the correct person using two identifiers.  Location: Patient: home Provider: home office   I discussed the limitations, risks, security and privacy concerns of performing an evaluation and management service by telephone and the availability of in person appointments. I also discussed with the patient that there may be a patient responsible charge related to this service. The patient expressed understanding and agreed to proceed.      I discussed the assessment and treatment plan with the patient. The patient was provided an opportunity to ask questions and all were answered. The patient agreed with the plan and demonstrated an understanding of the instructions.   The patient was advised to call back or seek an in-person evaluation if the symptoms worsen or if the condition fails to improve as anticipated.  I provided 15 minutes of non-face-to-face time during this encounter.   Levonne Spiller, MD  [facility] MD/PA/NP OP Progress Note  09/05/2021 2:03 PM Lindsay Palmer  MRN:  [id number]  Chief Complaint:  Chief Complaint  Patient presents with   Depression   Manic Behavior   Anxiety   Follow-up   HPI: This patient is an 81 year old married white female who lives with her husband in [place].  She is retired from [employer].  She has 1 son, 1 daughter and 3 grandchildren.  The patient returns for follow-up after 6 months regarding her bipolar disorder anxiety and insomnia.  She states in terms of her mental health she is doing well.  She denies significant depression anxiety.  However her abdominal pain and nausea are continuing and getting worse.  She states that she had a negative CT scan and a negative colonoscopy and endoscopy.  However she suffers from abdominal pain and nausea several times a week.  She is going to be seeing a new GI  specialist.  She is not eating very well but her weight has not changed.  She does not really get much exercise.  She states the Ambien really is not working for her sleep.  She has a really hard time getting to sleep.  Of note she does not get much exercise.  She has not really tried much of anything else I suggested a switch to trazodone and she is in agreement.  She denies any thoughts of self-harm or suicide. Visit Diagnosis:    ICD-10-CM   1. Bipolar I disorder, most recent episode depressed ([place])  F31.30       Past Psychiatric History: Long-term outpatient treatment for bipolar disorder  Past Medical History:  Past Medical History:  Diagnosis Date   Bipolar 2 disorder ([place])    Chronic insomnia    Congestive heart failure (CHF) ([place]) 12/2019   Diverticulosis of colon    Dyspnea    Elevated LFTs    Fatty liver disease, nonalcoholic    GERD (gastroesophageal reflux disease)    H/O first degree atrioventricular block    Hypercholesteremia    Hypertension    Major depressive disorder    Migraine    Osteopenia    Pancreatitis    Pneumonia    Sleep apnea    Stop Bang score of 4. Pt said she had sleep apnea at one time, but she has had another sleep study since then and they said she does not have it anymore.   Vitamin D deficiency  Past Surgical History:  Procedure Laterality Date   BILIARY STENT PLACEMENT N/A 07/18/2013   Procedure: BILIARY STENT PLACEMENT;  Surgeon: Malissa Hippo, MD;  Location: [facility];  Service: Endoscopy;  Laterality: N/A;   BIOPSY  03/24/2020   Procedure: BIOPSY;  Surgeon: Malissa Hippo, MD;  [facility];  Service: Endoscopy;;   Cataract Surgery Bilateral 08-2012   CHOLECYSTECTOMY     COLONOSCOPY  2003   Diverticulitis h/o polyps -due next 2015 -gastroscopy 2003   COLONOSCOPY WITH PROPOFOL N/A 03/24/2020   Procedure: COLONOSCOPY WITH PROPOFOL;  Surgeon: Malissa Hippo, MD;  [facility];  Service: Endoscopy;  Laterality:  N/A;  9:00, pt tested + 1/25 <90 days   ERCP     ERCP N/A 07/18/2013   Procedure: ENDOSCOPIC RETROGRADE CHOLANGIOPANCREATOGRAPHY (ERCP) COMMON BILE DUCT BRUSHING;  Surgeon: Malissa Hippo, MD;  Location: [facility];  Service: Endoscopy;  Laterality: N/A;   ERCP N/A 11/07/2013   Procedure: ENDOSCOPIC RETROGRADE CHOLANGIOPANCREATOGRAPHY (ERCP);  Surgeon: Malissa Hippo, MD;  Location: [facility];  Service: Endoscopy;  Laterality: N/A;   ESOPHAGEAL DILATION N/A 08/23/2016   Procedure: ESOPHAGEAL DILATION;  Surgeon: Malissa Hippo, MD;  [facility];  Service: Endoscopy;  Laterality: N/A;   ESOPHAGOGASTRODUODENOSCOPY N/A 08/23/2016   Procedure: ESOPHAGOGASTRODUODENOSCOPY (EGD);  Surgeon: Malissa Hippo, MD;  [facility];  Service: Endoscopy;  Laterality: N/A;  245-moved up to 100 per [name]   ESOPHAGOGASTRODUODENOSCOPY (EGD) WITH PROPOFOL N/A 03/24/2020   Procedure: ESOPHAGOGASTRODUODENOSCOPY (EGD) WITH PROPOFOL;  Surgeon: Malissa Hippo, MD;  [facility];  Service: Endoscopy;  Laterality: N/A;   EYE SURGERY Bilateral    cataract removal   NECK SURGERY     Stenosis C6-C7   PARTIAL COLECTOMY  2018   SIGMOIDOSCOPY  2007   no polyps   SPHINCTEROTOMY N/A 07/18/2013   Procedure: SPHINCTEROTOMY;  Surgeon: Malissa Hippo, MD;  [facility];  Service: Endoscopy;  Laterality: N/A;   SPHINCTEROTOMY  11/07/2013   Procedure: EXTENSION OF SPHINCTEROTOMY;  Surgeon: Malissa Hippo, MD;  Location: [facility];  Service: Endoscopy;;   STENT REMOVAL N/A 11/07/2013   Procedure: STENT REMOVAL;  Surgeon: Malissa Hippo, MD;  Location: [facility];  Service: Endoscopy;  Laterality: N/A;    Family Psychiatric History: see below  Family History:  Family History  Problem Relation Age of Onset   Diabetes Mother    Coronary artery disease Father    Gout Father    Breast cancer Sister    Pancreatic cancer Brother    Alcohol abuse Brother    Healthy Brother    Lung cancer Sister     Healthy Sister    Depression Sister    Hyperlipidemia Daughter    Hyperlipidemia Son    Bipolar disorder Son    Healthy Sister    Healthy Sister    Seizures Other     Social History:  Social History   Socioeconomic History   Marital status: Married    Spouse name: Not on file   Number of children: Not on file   Years of education: Not on file   Highest education level: Not on file  Occupational History   Not on file  Tobacco Use   Smoking status: Never   Smokeless tobacco: Never  Vaping Use   Vaping Use: Never used  Substance and Sexual Activity   Alcohol use: No    Alcohol/week: 0.0 standard drinks of alcohol  Drug use: No   Sexual activity: Not on file  Other Topics Concern   Not on file  Social History Narrative   Not on file   Social Determinants of Health   Financial Resource Strain: Not on file  Food Insecurity: Not on file  Transportation Needs: Not on file  Physical Activity: Not on file  Stress: Not on file  Social Connections: Not on file    Allergies:  Allergies  Allergen Reactions   Codeine Nausea And Vomiting   Ciprofloxacin Nausea And Vomiting   Flagyl [Metronidazole] Nausea And Vomiting   Oxycodone Nausea And Vomiting    Metabolic Disorder Labs: No results found for: "HGBA1C", "MPG" No results found for: "PROLACTIN" Lab Results  Component Value Date   TRIG 113 07/28/2013   Lab Results  Component Value Date   TSH 1.38 02/08/2021   TSH 1.213 09/14/2014    Therapeutic Level Labs: No results found for: "LITHIUM" No results found for: "VALPROATE" No results found for: "CBMZ"  Current Medications: Current Outpatient Medications  Medication Sig Dispense Refill   traZODone (DESYREL) 50 MG tablet Take 1 tablet (50 mg total) by mouth at bedtime. 90 tablet 2   acetaminophen (TYLENOL) 500 MG tablet Take 500-1,000 mg by mouth every 6 (six) hours as needed (pain).     ARIPiprazole (ABILIFY) 10 MG tablet Take 1 tablet (10 mg total) by  mouth at bedtime. 90 tablet 2   carvedilol (COREG) 25 MG tablet Take 25 mg by mouth 2 (two) times daily with a meal. LUNCH & EVENING     Cholecalciferol (VITAMIN D3) 10 MCG (400 UNIT) CAPS Take 400 Units by mouth daily.     esomeprazole (NEXIUM) 40 MG capsule TAKE 1 CAPSULE BY MOUTH EVERY DAY BEFORE BREAKFAST 90 capsule 1   ferrous sulfate (FERROUSUL) 325 (65 FE) MG tablet Take 1 tablet (325 mg total) by mouth 2 (two) times daily. (Patient taking differently: Take 325 mg by mouth daily with breakfast.)  0   hydrALAZINE (APRESOLINE) 100 MG tablet Take 100 mg by mouth 2 (two) times daily.     hyoscyamine (LEVSIN SL) 0.125 MG SL tablet PLACE 1-2 TABLETS UNDER THE TONGUE 2 (TWO) TIMES DAILY AS NEEDED. 120 tablet 2   lamoTRIgine (LAMICTAL) 200 MG tablet Take 1 tablet (200 mg total) by mouth daily. 90 tablet 2   LORazepam (ATIVAN) 0.5 MG tablet Take 1 tablet (0.5 mg total) by mouth 2 (two) times daily as needed for anxiety. 60 tablet 5   losartan (COZAAR) 100 MG tablet Take 100 mg by mouth daily.     meloxicam (MOBIC) 15 MG tablet Take 1 tablet (15 mg total) by mouth daily.     NON FORMULARY DOMPERIDONE 10MG   TAKE ONE BID FOR 15 DAYS - ordered by Dr. Laural Golden on 10/05/20. [facility]. (Patient not taking: Reported on 02/08/2021)     Omega-3 Fatty Acids (FISH OIL PO) Take 1 capsule by mouth in the morning and at bedtime.     ondansetron (ZOFRAN) 4 MG tablet TAKE 1 TABLET BY MOUTH EVERY 8 HOURS AS NEEDED FOR NAUSEA AND VOMITING 60 tablet 4   polyethylene glycol powder (GLYCOLAX/MIRALAX) 17 GM/SCOOP powder Take 17 grams daily prn for Constipation. 1581 g 5   potassium chloride (MICRO-K) 10 MEQ CR capsule TAKE 2 CAPSULES BY MOUTH DAILY AS NEEDED ( TAKE ON DAYS WHEN YOU TAKE TORSEMIDE) 180 capsule 0   promethazine (PHENERGAN) 12.5 MG tablet Take 1 tablet (12.5 mg total) by mouth every  8 (eight) hours as needed for nausea or vomiting. 30 tablet 2   simvastatin (ZOCOR) 20 MG tablet Take 20 mg by mouth daily  with lunch.     torsemide (DEMADEX) 20 MG tablet Take 20 mg by mouth every other day.     vitamin B-12 (CYANOCOBALAMIN) 1000 MCG tablet Take 1,000 mcg by mouth every evening.     No current facility-administered medications for this visit.     Musculoskeletal: Strength & Muscle Tone: na Gait & Station: na Patient leans: N/A  Psychiatric Specialty Exam: Review of Systems  Gastrointestinal:  Positive for abdominal pain and nausea.  Psychiatric/Behavioral:  Positive for sleep disturbance.   All other systems reviewed and are negative.   There were no vitals taken for this visit.There is no height or weight on file to calculate BMI.  General Appearance: NA  Eye Contact:  NA  Speech:  Clear and Coherent  Volume:  Normal  Mood:  Euthymic  Affect:  NA  Thought Process:  Goal Directed  Orientation:  Full (Time, Place, and Person)  Thought Content: Rumination   Suicidal Thoughts:  No  Homicidal Thoughts:  No  Memory:  Immediate;   Good Recent;   Good Remote;   Good  Judgement:  Good  Insight:  Fair  Psychomotor Activity:  Decreased  Concentration:  Concentration: Good and Attention Span: Good  Recall:  Good  Fund of Knowledge: Good  Language: Good  Akathisia:  No  Handed:  Right  AIMS (if indicated): not done  Assets:  Communication Skills Desire for Improvement Resilience Social Support  ADL's:  Intact  Cognition: WNL  Sleep:  Poor   Screenings: PHQ2-9    Flowsheet Row [facility] Video Visit from 03/08/2021 in [facility] Video Visit from 09/06/2020 in [facility]  PHQ-2 Total Score 1 0 1      Flowsheet Row Video Visit [facility] 09[facility]05/2021 [facility] Video Visit from 03/08/2021 [facility] [facility] from 02/11/2021 in [facility] EMERGENCY  DEPARTMENT  C-SSRS RISK CATEGORY No Risk No Risk No Risk        Assessment and Plan: This patient is an 81 year old female with a history of bipolar disorder.  She is doing fairly well although she is not sleeping well.  We will therefore discontinue Ambien in favor of trazodone 50 mg at bedtime.  She will continue Abilify 10 mg at bedtime for mood stabilization, Lamictal 200 mg daily for mood stabilization and lorazepam 0.5 mg twice daily as needed for anxiety.  She will return to see me in 6 months or call sooner if the new medicine for sleep is not working  Secretary/administrator of Care: Collaboration of Care: Other provider involved in patient's care AEB notes are shared with GI specialist on the epic system  Patient/Guardian was advised Release of Information must be obtained prior to any record release in order to collaborate their care with an outside provider. Patient/Guardian was advised if they have not already done so to contact the registration department to sign all necessary forms in order for [place] to release information regarding their care.   Consent: Patient/Guardian gives verbal consent for treatment and assignment of benefits for services provided during this visit. Patient/Guardian expressed understanding and agreed to proceed.    Diannia Ruder, MD 09/05/2021, 2:03 PM

## 2021-09-05 NOTE — Telephone Encounter (Signed)
Patient called and LMOM needing to schedule f/u appt. Staff called patient to schedule appt and was not able to reach patient. Phone just rang and patient do not have voicemail.

## 2021-09-05 NOTE — Telephone Encounter (Signed)
02/08/2021 last seen by Dr. Karilyn Cota

## 2021-09-23 ENCOUNTER — Other Ambulatory Visit (HOSPITAL_COMMUNITY): Payer: Self-pay | Admitting: Psychiatry

## 2021-09-26 ENCOUNTER — Encounter (INDEPENDENT_AMBULATORY_CARE_PROVIDER_SITE_OTHER): Payer: Self-pay | Admitting: Gastroenterology

## 2021-09-26 ENCOUNTER — Ambulatory Visit (INDEPENDENT_AMBULATORY_CARE_PROVIDER_SITE_OTHER): Payer: Medicare Other | Admitting: Gastroenterology

## 2021-09-26 VITALS — BP 147/66 | HR 59 | Temp 97.9°F | Ht 64.0 in | Wt 193.8 lb

## 2021-09-26 DIAGNOSIS — R110 Nausea: Secondary | ICD-10-CM | POA: Diagnosis not present

## 2021-09-26 DIAGNOSIS — K5900 Constipation, unspecified: Secondary | ICD-10-CM

## 2021-09-26 MED ORDER — PROCHLORPERAZINE MALEATE 10 MG PO TABS: 10.0000 mg | ORAL_TABLET | Freq: Four times a day (QID) | ORAL | 0 refills | Status: DC | PRN

## 2021-09-26 NOTE — Progress Notes (Unsigned)
Lindsay Palmer, M.D. Gastroenterology & Hepatology [facility] [address] [place], [place] [address]  Primary Care Physician: Garald Braver [address] [place] [address]  I will communicate my assessment and recommendations to the referring MD via EMR.  Problems: Chronic nausea History of post ERCP pancreatitis H/o perforated diverticulitis  History of Present Illness: Lindsay Palmer is a 81 y.o. 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  The patient was last seen on 02/08/2021. At that time, the patient had CBC, iron stores, vitamin D, TSH and CMP checked.  CMP showed a creatinine of 1.61, BUN of 41, normal electrolytes and liver function, CBC showed a stable hemoglobin of 12.1, with normal WBC of 4.1 and platelets 176, TSH was normal at 1.38, vitamin D was normal at 50.  A CT scan was performed in January 31st 2023 which based on the note by Dr. Karilyn Cota, it showed pneumobilia secondary to previous sphincterotomy and atrophy pancreatitis, evidence of prior colon surgery.  She has a 6 mm groundglass density in the left lower lobe and was advised to have a repeat CT chest in 12 months.  She was started on low-dose promethazine 12.5 mg every 8 hours as needed.  She reports that she has presented chronic nausea after her episode of pancreatitis.  Patient was referred to [facility] for a second opinion for her chronic nausea, but she had her appointment moved to September and she had to cancel the state as she was not able to make it at that time.  Based on notes, the patient was prescribed lorazepam for nausea  in the past without any relief of her symptoms.  She has also tried Zofran and promethazine. She has been taking Zofran intermittently which occasionally helps relieving her symptoms of nausea.  She endorses not having any vomiting episodes.  She states she tried Phenergan but believes her symptoms did not improve so she stopped taking the medication. Also stopped Ativan as it was making her very drowsy.  She reports that she has intermittent episodes of pain in her abdomen diffusely which improves hyoscyamine.  She does not eat too much but she has kept her weight as she has been eating sweets and crackers.  She has constipation, may have a bowel movement possibly once a week.  The patient denies having any fever, chills, hematochezia, melena, hematemesis, abdominal distention, diarrhea, jaundice, pruritus or weight loss.    Last EGD: 03/2020 - Normal esophagus. - Z-line regular, 37 cm from the incisors. - 3 cm hiatal hernia. - Gastritis. Biopsied. - Normal duodenal bulb and second portion of the duodenum. Biopsied.  Last Colonoscopy: 03/2020 - Diverticulosis in the proximal sigmoid colon. - Patent end-to-end colo-colonic anastomosis. - External hemorrhoids.  Path: A. STOMACH, BIOPSY:  - Mild chronic gastritis and reactive gastropathy.  - Intestinal metaplasia is present focally in antrum, negative for  dysplasia.  - Warthin-Starry stain is negative for Helicobacter pylori.   B. DUODENUM, BIOPSY:  - Duodenal mucosa with no significant pathologic findings.  - Negative for increased intraepithelial lymphocytes and villous  architectural changes.   Past Medical History: Past Medical History:  Diagnosis Date   Bipolar 2 disorder (HCC)    Chronic insomnia    Congestive heart  failure (CHF) (HCC) 12/2019   Diverticulosis of colon    Dyspnea    Elevated LFTs    Fatty liver disease, nonalcoholic    GERD (gastroesophageal reflux disease)    H/O first degree atrioventricular block     Hypercholesteremia    Hypertension    Major depressive disorder    Migraine    Osteopenia    Pancreatitis    Pneumonia    Sleep apnea    Stop Bang score of 4. Pt said she had sleep apnea at one time, but she has had another sleep study since then and they said she does not have it anymore.   Vitamin D deficiency     Past Surgical History: Past Surgical History:  Procedure Laterality Date   BILIARY STENT PLACEMENT N/A 07/18/2013   Procedure: BILIARY STENT PLACEMENT;  Surgeon: Malissa Hippo, MD;  Location: [facility];  Service: Endoscopy;  Laterality: N/A;   BIOPSY  03/24/2020   Procedure: BIOPSY;  Surgeon: Malissa Hippo, MD;  Location: [facility];  Service: Endoscopy;;   Cataract Surgery Bilateral 08-2012   CHOLECYSTECTOMY     COLONOSCOPY  2003   Diverticulitis h/o polyps -due next 2015 -gastroscopy 2003   COLONOSCOPY WITH PROPOFOL N/A 03/24/2020   Procedure: COLONOSCOPY WITH PROPOFOL;  Surgeon: Malissa Hippo, MD;  Location: [facility];  Service: Endoscopy;  Laterality: N/A;  9:00, pt tested + 1/25 <90 days   ERCP     ERCP N/A 07/18/2013   Procedure: ENDOSCOPIC RETROGRADE CHOLANGIOPANCREATOGRAPHY (ERCP) COMMON BILE DUCT BRUSHING;  Surgeon: Malissa Hippo, MD;  Location: [facility];  Service: Endoscopy;  Laterality: N/A;   ERCP N/A 11/07/2013   Procedure: ENDOSCOPIC RETROGRADE CHOLANGIOPANCREATOGRAPHY (ERCP);  Surgeon: Malissa Hippo, MD;  Location: [facility];  Service: Endoscopy;  Laterality: N/A;   ESOPHAGEAL DILATION N/A 08/23/2016   Procedure: ESOPHAGEAL DILATION;  Surgeon: Malissa Hippo, MD;  [facility];  Service: Endoscopy;  Laterality: N/A;   ESOPHAGOGASTRODUODENOSCOPY N/A 08/23/2016   Procedure: ESOPHAGOGASTRODUODENOSCOPY (EGD);  Surgeon: Malissa Hippo, MD;  [facility];  Service: Endoscopy;  Laterality: N/A;  245-moved up to 100 per [name]   ESOPHAGOGASTRODUODENOSCOPY (EGD) WITH PROPOFOL N/A 03/24/2020   Procedure: ESOPHAGOGASTRODUODENOSCOPY (EGD)  WITH PROPOFOL;  Surgeon: Malissa Hippo, MD;  [facility];  Service: Endoscopy;  Laterality: N/A;   EYE SURGERY Bilateral    cataract removal   NECK SURGERY     Stenosis C6-C7   PARTIAL COLECTOMY  2018   SIGMOIDOSCOPY  2007   no polyps   SPHINCTEROTOMY N/A 07/18/2013   Procedure: SPHINCTEROTOMY;  Surgeon: Malissa Hippo, MD;  Location: [facility];  Service: Endoscopy;  Laterality: N/A;   SPHINCTEROTOMY  11/07/2013   Procedure: EXTENSION OF SPHINCTEROTOMY;  Surgeon: Malissa Hippo, MD;  Location: [facility];  Service: Endoscopy;;   STENT REMOVAL N/A 11/07/2013   Procedure: STENT REMOVAL;  Surgeon: Malissa Hippo, MD;  Location: [facility];  Service: Endoscopy;  Laterality: N/A;    Family History: Family History  Problem Relation Age of Onset   Diabetes Mother    Coronary artery disease Father    Gout Father    Breast cancer Sister    Pancreatic cancer Brother    Alcohol abuse Brother    Healthy Brother    Lung cancer Sister    Healthy Sister    Depression Sister    Hyperlipidemia Daughter    Hyperlipidemia Son    Bipolar disorder  Son    Healthy Sister    Healthy Sister    Seizures Other     Social History: Social History   Tobacco Use  Smoking Status Never   Passive exposure: Never  Smokeless Tobacco Never   Social History   Substance and Sexual Activity  Alcohol Use No   Alcohol/week: 0.0 standard drinks of alcohol   Social History   Substance and Sexual Activity  Drug Use No    Allergies: Allergies  Allergen Reactions   Codeine Nausea And Vomiting   Ciprofloxacin Nausea And Vomiting   Flagyl [Metronidazole] Nausea And Vomiting   Oxycodone Nausea And Vomiting    Medications: Current Outpatient Medications  Medication Sig Dispense Refill   acetaminophen (TYLENOL) 500 MG tablet Take 500-1,000 mg by mouth every 6 (six) hours as needed (pain).     ARIPiprazole (ABILIFY) 10 MG tablet Take 1 tablet (10 mg total) by mouth at bedtime. 90 tablet 2    carvedilol (COREG) 25 MG tablet Take 25 mg by mouth 2 (two) times daily with a meal. LUNCH & EVENING     Cholecalciferol (VITAMIN D3) 10 MCG (400 UNIT) CAPS Take 400 Units by mouth daily.     esomeprazole (NEXIUM) 40 MG capsule TAKE 1 CAPSULE BY MOUTH ONCE DAILY BEFORE BREAKFAST 90 capsule 0   ferrous sulfate (FERROUSUL) 325 (65 FE) MG tablet Take 1 tablet (325 mg total) by mouth 2 (two) times daily. (Patient taking differently: Take 325 mg by mouth daily with breakfast.)  0   hydrALAZINE (APRESOLINE) 100 MG tablet Take 100 mg by mouth 2 (two) times daily.     hyoscyamine (LEVSIN SL) 0.125 MG SL tablet PLACE 1-2 TABLETS UNDER THE TONGUE 2 (TWO) TIMES DAILY AS NEEDED. 120 tablet 2   lamoTRIgine (LAMICTAL) 200 MG tablet Take 1 tablet (200 mg total) by mouth daily. 90 tablet 2   LORazepam (ATIVAN) 0.5 MG tablet Take 1 tablet (0.5 mg total) by mouth 2 (two) times daily as needed for anxiety. 60 tablet 5   losartan (COZAAR) 100 MG tablet Take 100 mg by mouth daily.     meloxicam (MOBIC) 15 MG tablet Take 1 tablet (15 mg total) by mouth daily.     Omega-3 Fatty Acids (FISH OIL PO) Take 1 capsule by mouth in the morning and at bedtime.     ondansetron (ZOFRAN) 4 MG tablet TAKE 1 TABLET BY MOUTH EVERY 8 HOURS AS NEEDED FOR NAUSEA AND VOMITING 60 tablet 4   polyethylene glycol powder (GLYCOLAX/MIRALAX) 17 GM/SCOOP powder Take 17 grams daily prn for Constipation. 1581 g 5   potassium chloride (MICRO-K) 10 MEQ CR capsule TAKE 2 CAPSULES BY MOUTH DAILY AS NEEDED ( TAKE ON DAYS WHEN YOU TAKE TORSEMIDE) 180 capsule 0   prochlorperazine (COMPAZINE) 10 MG tablet Take 1 tablet (10 mg total) by mouth every 6 (six) hours as needed for nausea or vomiting. 30 tablet 0   simvastatin (ZOCOR) 20 MG tablet Take 20 mg by mouth daily with lunch.     torsemide (DEMADEX) 20 MG tablet Take 20 mg by mouth every other day.     traZODone (DESYREL) 50 MG tablet Take 1 tablet (50 mg total) by mouth at bedtime. 90 tablet 2   vitamin  B-12 (CYANOCOBALAMIN) 1000 MCG tablet Take 1,000 mcg by mouth every evening.     zolpidem (AMBIEN) 10 MG tablet Take 10 mg by mouth at bedtime as needed for sleep.     NON FORMULARY DOMPERIDONE 10MG   TAKE  ONE BID FOR 15 DAYS - ordered by Dr. Karilyn Cota on 10/05/20. [facility]. (Patient not taking: Reported on 02/08/2021)     No current facility-administered medications for this visit.    Review of Systems: GENERAL: negative for malaise, night sweats HEENT: No changes in hearing or vision, no nose bleeds or other nasal problems. NECK: Negative for lumps, goiter, pain and significant neck swelling RESPIRATORY: Negative for cough, wheezing CARDIOVASCULAR: Negative for chest pain, leg swelling, palpitations, orthopnea GI: SEE HPI MUSCULOSKELETAL: Negative for joint pain or swelling, back pain, and muscle pain. SKIN: Negative for lesions, rash PSYCH: Negative for sleep disturbance, mood disorder and recent psychosocial stressors. HEMATOLOGY Negative for prolonged bleeding, bruising easily, and swollen nodes. ENDOCRINE: Negative for cold or heat intolerance, polyuria, polydipsia and goiter. NEURO: negative for tremor, gait imbalance, syncope and seizures. The remainder of the review of systems is noncontributory.   Physical Exam: BP (!) 147/66 (BP Location: Left Arm, Patient Position: Sitting, Cuff Size: Large)   Pulse (!) 59   Temp 97.9 F (36.6 C) (Oral)   Ht 5\' 4"  (1.626 m)   Wt 193 lb 12.8 oz (87.9 kg)   BMI 33.27 kg/m  GENERAL: The patient is AO x3, in no acute distress. HEENT: Head is normocephalic and atraumatic. EOMI are intact. Mouth is well hydrated and without lesions. NECK: Supple. No masses LUNGS: Clear to auscultation. No presence of rhonchi/wheezing/rales. Adequate chest expansion HEART: RRR, normal s1 and s2. ABDOMEN: Soft, nontender, no guarding, no peritoneal signs, and nondistended. BS +. No masses. EXTREMITIES: Without any cyanosis, clubbing, rash, lesions or  edema. NEUROLOGIC: AOx3, no focal motor deficit. SKIN: no jaundice, no rashes  Imaging/Labs: as above  I personally reviewed and interpreted the available labs, imaging and endoscopic files.  Impression and Plan: Lindsay Palmer is a 81 y.o. 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  The patient reports a chronic history of nausea which seems started around the time she developed her post ERCP pancreatitis.  She has tried different medications in the past but only Zofran has partially helped relieve her symptoms.  She has not presented any other associated symptoms besides some intermittent abdominal pain.  Has had previous cross-sectional abdominal imaging and endoscopic investigations which have been unremarkable.  Will check fasting cortisol today to rule out adrenal insufficiency.  Part of her symptoms may also be related to constipation for which she was encouraged to increase the intake of MiraLAX to achieve a more regular bowel movement frequency.  She can also continue taking Zofran as needed for nausea but it preparatory she can try Compazine.  -Check fasting cortisol in AM -continue Zofran 4 mg q8h as needed for nausea -If after 1 hour of taking ondansetron there is no symptom improvement, take compazine for nausea control -Start taking Miralax 1 capful every night for one week. If bowel movements do not improve, increase to 2 capfuls. If after two weeks there is no improvement, increase to 3 capfuls.  All questions were answered.      96, MD Gastroenterology and Hepatology [facility]

## 2021-09-26 NOTE — Progress Notes (Signed)
She reports that she has presented chronic nausea after her episode of pancreatitis.  Patient was referred to [facility] for a second opinion for her chronic nausea, but she had her appointmetn moved to September and shehad to cancel as she was not able to make it at that time.  She has been taking Zofran intermittently which occasionally helps relieving her symptoms of nausea but no vomiting. She states she tried Phenergan but believes her symptoms did not improve. Also stopped Ativan as it was making her very drowsy.  She reports that she has intermittent episodes of pain in her abdomen diffusely which improves hyoscyamine.  She does not eat too much but she has kept her weight as she has been eating sweets and crackers.  She has constipation, may go possibly once a week.

## 2021-09-26 NOTE — Patient Instructions (Addendum)
Perform blood workup fasting in AM continue Zofran 4 mg q8h as needed for nausea If after 1 hour of taking ondansetron there is no symptom improvement, take compazine for nausea control Start taking Miralax 1 capful every night for one week. If bowel movements do not improve, increase to 2 capfuls. If after two weeks there is no improvement, increase to 3 capfuls.

## 2021-09-29 ENCOUNTER — Telehealth (INDEPENDENT_AMBULATORY_CARE_PROVIDER_SITE_OTHER): Payer: Self-pay | Admitting: Gastroenterology

## 2021-09-29 NOTE — Telephone Encounter (Signed)
Patient made aware her most recent labs ( Cortisol )  were normal.

## 2021-09-29 NOTE — Telephone Encounter (Signed)
Hi Crystal,  Can you please call the patient and tell the patient the most recent blood testing (cortisol level) was normal (value of 12.8)?  Thanks,  Katrinka Blazing, MD Gastroenterology and Hepatology [facility]

## 2021-10-05 ENCOUNTER — Telehealth (HOSPITAL_COMMUNITY): Payer: Self-pay | Admitting: *Deleted

## 2021-10-05 ENCOUNTER — Other Ambulatory Visit (HOSPITAL_COMMUNITY): Payer: Self-pay | Admitting: Psychiatry

## 2021-10-05 MED ORDER — ZOLPIDEM TARTRATE 10 MG PO TABS: 10.0000 mg | ORAL_TABLET | Freq: Every evening | ORAL | 2 refills | Status: DC | PRN

## 2021-10-05 NOTE — Telephone Encounter (Signed)
sent 

## 2021-10-05 NOTE — Telephone Encounter (Signed)
Patient called stating that the Trazodone is not working. Per pt it is making her sleep at all. Per pt due to this happening, she might as well go back to her old medication Zolpidem.    [phone]

## 2021-10-05 NOTE — Telephone Encounter (Signed)
Called patient and husband stated patient was sleeping and will have patient call back when she gets up

## 2021-10-05 NOTE — Telephone Encounter (Signed)
Ambien sent in

## 2021-10-05 NOTE — Telephone Encounter (Signed)
Patient called stating she would like to see if provider could please send her Ambien to [facility] in [place]. Per pt that's the pharmacy she would like to use from now on. Staff informed patient message will be sent to provider and pharmacy was updated.

## 2021-10-06 NOTE — Telephone Encounter (Signed)
Spoke with patient and informed her that medication was sent to [facility] and patient verbalized understanding.

## 2021-10-19 NOTE — Telephone Encounter (Signed)
Staff informed patient medication was sent in

## 2021-11-25 ENCOUNTER — Other Ambulatory Visit (INDEPENDENT_AMBULATORY_CARE_PROVIDER_SITE_OTHER): Payer: Self-pay | Admitting: Gastroenterology

## 2021-11-25 DIAGNOSIS — R110 Nausea: Secondary | ICD-10-CM

## 2021-11-28 NOTE — Telephone Encounter (Signed)
Last seen 09/26/21 

## 2021-12-12 ENCOUNTER — Other Ambulatory Visit (INDEPENDENT_AMBULATORY_CARE_PROVIDER_SITE_OTHER): Payer: Self-pay

## 2021-12-12 MED ORDER — POTASSIUM CHLORIDE ER 10 MEQ PO CPCR: ORAL_CAPSULE | ORAL | 0 refills | Status: AC

## 2021-12-12 NOTE — Telephone Encounter (Signed)
Will refill a week, but she needs to follow with PCP regarding potassium supplementation (this is not regularly prescribed by our clinic)

## 2021-12-12 NOTE — Telephone Encounter (Signed)
I called and spoke with the patient she is aware we sent in a few and will need to follow up with the pcp to get filled in the future.

## 2021-12-28 ENCOUNTER — Other Ambulatory Visit (INDEPENDENT_AMBULATORY_CARE_PROVIDER_SITE_OTHER): Payer: Self-pay | Admitting: Gastroenterology

## 2021-12-29 ENCOUNTER — Encounter (INDEPENDENT_AMBULATORY_CARE_PROVIDER_SITE_OTHER): Payer: Self-pay | Admitting: Gastroenterology

## 2022-01-02 ENCOUNTER — Other Ambulatory Visit (HOSPITAL_COMMUNITY): Payer: Self-pay | Admitting: Psychiatry

## 2022-01-19 ENCOUNTER — Ambulatory Visit (INDEPENDENT_AMBULATORY_CARE_PROVIDER_SITE_OTHER): Payer: Medicare Other | Admitting: Gastroenterology

## 2022-01-25 ENCOUNTER — Other Ambulatory Visit (INDEPENDENT_AMBULATORY_CARE_PROVIDER_SITE_OTHER): Payer: Self-pay | Admitting: Gastroenterology

## 2022-01-25 DIAGNOSIS — R110 Nausea: Secondary | ICD-10-CM

## 2022-02-01 ENCOUNTER — Other Ambulatory Visit (HOSPITAL_COMMUNITY): Payer: Self-pay | Admitting: Psychiatry

## 2022-02-03 ENCOUNTER — Telehealth (HOSPITAL_COMMUNITY): Payer: Self-pay | Admitting: *Deleted

## 2022-02-03 MED ORDER — ZOLPIDEM TARTRATE 10 MG PO TABS: 10.0000 mg | ORAL_TABLET | Freq: Every evening | ORAL | 0 refills | Status: DC | PRN

## 2022-02-03 NOTE — Telephone Encounter (Signed)
Patient called requesting refills for her Ambien 10mg  to be sent to [facility] in [place].   Script was last sent to pharmacy Dec 5th.

## 2022-02-03 NOTE — Telephone Encounter (Signed)
Sent to pharmacy. Please inform patient

## 2022-02-03 NOTE — Addendum Note (Signed)
Addended by: Berniece Andreas T on: 02/03/2022 12:03 PM   Modules accepted: Orders

## 2022-02-06 NOTE — Telephone Encounter (Signed)
Spoke with patient and she stated she was able to get her medication

## 2022-03-02 ENCOUNTER — Encounter (INDEPENDENT_AMBULATORY_CARE_PROVIDER_SITE_OTHER): Payer: Self-pay | Admitting: Gastroenterology

## 2022-03-02 ENCOUNTER — Ambulatory Visit (INDEPENDENT_AMBULATORY_CARE_PROVIDER_SITE_OTHER): Payer: Medicare Other | Admitting: Gastroenterology

## 2022-03-02 ENCOUNTER — Other Ambulatory Visit (HOSPITAL_COMMUNITY): Payer: Self-pay | Admitting: Psychiatry

## 2022-03-02 VITALS — BP 152/70 | HR 73 | Temp 98.4°F | Ht 64.0 in | Wt 187.3 lb

## 2022-03-02 DIAGNOSIS — R110 Nausea: Secondary | ICD-10-CM

## 2022-03-02 NOTE — Patient Instructions (Addendum)
Schedule gastric emptying study Hold medication for vomiting one week before undergoing gastric emptying study Call to update which is the medication you are taking for nausea

## 2022-03-02 NOTE — Progress Notes (Signed)
Maylon Peppers, M.D. Gastroenterology & Hepatology [facility] [address] [place], [place] [address]  Primary Care Physician: Thea Alken [address] [place] [address]  I will communicate my assessment and recommendations to the referring MD via EMR.  Problems: Chronic postprandial  nausea History of post ERCP pancreatitis H/o perforated diverticulitis  History of Present Illness: Lindsay Palmer is a 82 y.o. 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  The patient was last seen on 09/26/2021. At that time, the patient had fasting cortical checked which was normal (12.8).  She was continued on Zofran as needed with breakthrough doses of Compazine for nausea control.  She was also advised to take MiraLAX for constipation.  A CT scan was performed in January 31st 2023 which based on the note by Dr. Laural Golden, it showed pneumobilia secondary to previous sphincterotomy and atrophy pancreatitis, evidence of prior colon surgery.  She has a 6 mm groundglass density in the left lower lobe and was advised to have a repeat CT chest in 12 months.  States she has nausea every day, usually after or while eating. No vomiting but "she fights to vomit". She is taking  an anti nausea medication (believes it is Zofran) every 6 hours. She is not taking Compazine, does not remember ever taking this medication.  Near the patient nor her husband are sure what she is currently taking.  She was told by her cardiologist that domperidone was contraindicated in the past and she was concerned about the potential side effects with this  medicine.  Has been moving her bowels regularly, very seldom takes Miralax to move her bowels.  The patient denies having any nausea, vomiting, fever, chills, hematochezia, melena, hematemesis, abdominal distention, abdominal pain, diarrhea, jaundice, pruritus.  She has lost 6 pounds since last time she was in clinic.  Importantly, the patient had a gastric emptying study on 02/15/2015 which showed slightly delayed emptying at 4 hours, with 85% of the gastric contents emptied (normal more than 90%)  Last EGD: 03/2020 - Normal esophagus. - Z-line regular, 37 cm from the incisors. - 3 cm hiatal hernia. - Gastritis. Biopsied. - Normal duodenal bulb and second portion of the duodenum. Biopsied.   Last Colonoscopy: 03/2020 - Diverticulosis in the proximal sigmoid colon. - Patent end-to-end colo-colonic anastomosis. - External hemorrhoids.   Path: A. STOMACH, BIOPSY:  - Mild chronic gastritis and reactive gastropathy.  - Intestinal metaplasia is present focally in antrum, negative for  dysplasia.  - Warthin-Starry stain is negative for Helicobacter pylori.   B. DUODENUM, BIOPSY:  - Duodenal mucosa with no significant pathologic findings.  - Negative for increased intraepithelial lymphocytes and villous  architectural changes.   Past Medical History: Past Medical History:  Diagnosis Date   Bipolar 2 disorder ([place])    Chronic insomnia    Congestive heart failure (CHF) ([place]) 12/2019   Diverticulosis of colon    Dyspnea    Elevated LFTs    Fatty liver disease, nonalcoholic    GERD (gastroesophageal reflux disease)    H/O first degree atrioventricular block    Hypercholesteremia    Hypertension    Major depressive disorder    Migraine    Osteopenia    Pancreatitis    Pneumonia  Sleep apnea    Stop Bang score of 4. Pt said she had sleep apnea at one time, but she has had another sleep study since then and they said she does not have it anymore.   Vitamin D deficiency      Past Surgical History: Past Surgical History:  Procedure Laterality Date   BILIARY STENT PLACEMENT N/A 07/18/2013   Procedure: BILIARY STENT PLACEMENT;  Surgeon: Rogene Houston, MD;  Location: [facility];  Service: Endoscopy;  Laterality: N/A;   BIOPSY  03/24/2020   Procedure: BIOPSY;  Surgeon: Rogene Houston, MD;  [facility];  Service: Endoscopy;;   Cataract Surgery Bilateral 08-2012   CHOLECYSTECTOMY     COLONOSCOPY  2003   Diverticulitis h/o polyps -due next 2015 -gastroscopy 2003   COLONOSCOPY WITH PROPOFOL N/A 03/24/2020   Procedure: COLONOSCOPY WITH PROPOFOL;  Surgeon: Rogene Houston, MD;  [facility];  Service: Endoscopy;  Laterality: N/A;  9:00, pt tested + 1/25 <90 days   ERCP     ERCP N/A 07/18/2013   Procedure: ENDOSCOPIC RETROGRADE CHOLANGIOPANCREATOGRAPHY (ERCP) COMMON BILE DUCT BRUSHING;  Surgeon: Rogene Houston, MD;  Location: [facility];  Service: Endoscopy;  Laterality: N/A;   ERCP N/A 11/07/2013   Procedure: ENDOSCOPIC RETROGRADE CHOLANGIOPANCREATOGRAPHY (ERCP);  Surgeon: Rogene Houston, MD;  Location: [facility];  Service: Endoscopy;  Laterality: N/A;   ESOPHAGEAL DILATION N/A 08/23/2016   Procedure: ESOPHAGEAL DILATION;  Surgeon: Rogene Houston, MD;  [facility];  Service: Endoscopy;  Laterality: N/A;   ESOPHAGOGASTRODUODENOSCOPY N/A 08/23/2016   Procedure: ESOPHAGOGASTRODUODENOSCOPY (EGD);  Surgeon: Rogene Houston, MD;  [facility];  Service: Endoscopy;  Laterality: N/A;  245-moved up to 100 per Lelon Frohlich   ESOPHAGOGASTRODUODENOSCOPY (EGD) WITH PROPOFOL N/A 03/24/2020   Procedure: ESOPHAGOGASTRODUODENOSCOPY (EGD) WITH PROPOFOL;  Surgeon: Rogene Houston, MD;  [facility];  Service: Endoscopy;  Laterality: N/A;   EYE SURGERY Bilateral    cataract removal   NECK SURGERY     Stenosis C6-C7   PARTIAL COLECTOMY  2018   SIGMOIDOSCOPY  2007   no polyps   SPHINCTEROTOMY N/A 07/18/2013   Procedure: SPHINCTEROTOMY;   Surgeon: Rogene Houston, MD;  Location: [facility];  Service: Endoscopy;  Laterality: N/A;   SPHINCTEROTOMY  11/07/2013   Procedure: EXTENSION OF SPHINCTEROTOMY;  Surgeon: Rogene Houston, MD;  Location: [facility];  Service: Endoscopy;;   STENT REMOVAL N/A 11/07/2013   Procedure: STENT REMOVAL;  Surgeon: Rogene Houston, MD;  Location: [facility];  Service: Endoscopy;  Laterality: N/A;    Family History: Family History  Problem Relation Age of Onset   Diabetes Mother    Coronary artery disease Father    Gout Father    Breast cancer Sister    Pancreatic cancer Brother    Alcohol abuse Brother    Healthy Brother    Lung cancer Sister    Healthy Sister    Depression Sister    Hyperlipidemia Daughter    Hyperlipidemia Son    Bipolar disorder Son    Healthy Sister    Healthy Sister    Seizures Other     Social History: Social History   Tobacco Use  Smoking Status Never   Passive exposure: Never  Smokeless Tobacco Never   Social History   Substance and Sexual Activity  Alcohol Use No   Alcohol/week: 0.0 standard drinks of alcohol   Social History   Substance and Sexual Activity  Drug Use No    Allergies: Allergies  Allergen Reactions   Codeine Nausea And Vomiting   Ciprofloxacin Nausea And Vomiting   Flagyl [Metronidazole] Nausea And Vomiting   Oxycodone Nausea And Vomiting    Medications: Current Outpatient Medications  Medication Sig Dispense Refill   acetaminophen (TYLENOL) 500 MG tablet Take 500-1,000 mg by mouth every 6 (six) hours as needed (pain).     ARIPiprazole (ABILIFY) 10 MG tablet Take 1 tablet (10 mg total) by mouth at bedtime. 90 tablet 2   carvedilol (COREG) 25 MG tablet Take 25 mg by mouth 2 (two) times daily with a meal. LUNCH & EVENING     Cholecalciferol (VITAMIN D3) 10 MCG (400 UNIT) CAPS Take 400 Units by mouth daily.     esomeprazole (NEXIUM) 40 MG capsule TAKE 1 CAPSULE BY MOUTH ONCE DAILY BEFORE BREAKFAST 90 capsule 3   ferrous sulfate  (FERROUSUL) 325 (65 FE) MG tablet Take 1 tablet (325 mg total) by mouth 2 (two) times daily. (Patient taking differently: Take 325 mg by mouth daily with breakfast.)  0   hydrALAZINE (APRESOLINE) 100 MG tablet Take 100 mg by mouth 2 (two) times daily.     hyoscyamine (LEVSIN SL) 0.125 MG SL tablet PLACE 1-2 TABLETS UNDER THE TONGUE 2 (TWO) TIMES DAILY AS NEEDED. 120 tablet 2   lamoTRIgine (LAMICTAL) 200 MG tablet Take 1 tablet (200 mg total) by mouth daily. 90 tablet 2   LORazepam (ATIVAN) 0.5 MG tablet Take 1 tablet (0.5 mg total) by mouth 2 (two) times daily as needed for anxiety. 60 tablet 5   losartan (COZAAR) 100 MG tablet Take 100 mg by mouth daily.     meloxicam (MOBIC) 15 MG tablet Take 1 tablet (15 mg total) by mouth daily.     Omega-3 Fatty Acids (FISH OIL PO) Take 1 capsule by mouth in the morning and at bedtime.     ondansetron (ZOFRAN) 4 MG tablet TAKE 1 TABLET BY MOUTH EVERY 8 HOURS AS NEEDED FOR NAUSEA AND VOMITING 60 tablet 4   OVER THE COUNTER MEDICATION Vit D 400 units capsule daily     polyethylene glycol powder (GLYCOLAX/MIRALAX) 17 GM/SCOOP powder Take 17 grams daily prn for Constipation. 1581 g 5   potassium chloride (MICRO-K) 10 MEQ CR capsule TAKE 2 CAPSULES BY MOUTH DAILY AS NEEDED ( TAKE ON DAYS WHEN YOU TAKE TORSEMIDE) 14 capsule 0   prochlorperazine (COMPAZINE) 10 MG tablet TAKE 1 TABLET BY MOUTH EVERY 6 HOURS AS NEEDED FOR NAUSEA FOR VOMITING 30 tablet 2   simvastatin (ZOCOR) 20 MG tablet Take 20 mg by mouth daily with lunch.     torsemide (DEMADEX) 20 MG tablet Take 20 mg by mouth every other day.     traZODone (DESYREL) 50 MG tablet Take 1 tablet (50 mg total) by mouth at bedtime. 90 tablet 2   vitamin B-12 (CYANOCOBALAMIN) 1000 MCG tablet Take 1,000 mcg by mouth every evening.     zolpidem (AMBIEN) 10 MG tablet Take 1 tablet (10 mg total) by mouth at bedtime as needed. for sleep 30 tablet 0   NON FORMULARY DOMPERIDONE 10MG   TAKE ONE BID FOR 15 DAYS - ordered by Dr.  Laural Golden on 10/05/20. [facility]. (Patient not taking: Reported on 02/08/2021)     No current facility-administered medications for this visit.    Review of Systems: GENERAL: negative for malaise, night sweats HEENT: No changes in hearing or vision, no nose bleeds or other nasal problems. NECK: Negative  for lumps, goiter, pain and significant neck swelling RESPIRATORY: Negative for cough, wheezing CARDIOVASCULAR: Negative for chest pain, leg swelling, palpitations, orthopnea GI: SEE HPI MUSCULOSKELETAL: Negative for joint pain or swelling, back pain, and muscle pain. SKIN: Negative for lesions, rash PSYCH: Negative for sleep disturbance, mood disorder and recent psychosocial stressors. HEMATOLOGY Negative for prolonged bleeding, bruising easily, and swollen nodes. ENDOCRINE: Negative for cold or heat intolerance, polyuria, polydipsia and goiter. NEURO: negative for tremor, gait imbalance, syncope and seizures. The remainder of the review of systems is noncontributory.   Physical Exam: BP (!) 152/70 (BP Location: Left Arm, Patient Position: Sitting, Cuff Size: Large)   Pulse 73   Temp 98.4 F (36.9 C) (Temporal)   Ht 5\' 4"  (1.626 m)   Wt 187 lb 4.8 oz (85 kg)   BMI 32.15 kg/m  GENERAL: The patient is AO x3, in no acute distress. HEENT: Head is normocephalic and atraumatic. EOMI are intact. Mouth is well hydrated and without lesions. NECK: Supple. No masses LUNGS: Clear to auscultation. No presence of rhonchi/wheezing/rales. Adequate chest expansion HEART: RRR, normal s1 and s2. ABDOMEN: Soft, nontender, no guarding, no peritoneal signs, and nondistended. BS +. No masses. EXTREMITIES: Without any cyanosis, clubbing, rash, lesions or edema. NEUROLOGIC: AOx3, no focal motor deficit.  She has a resting tremor. SKIN: no jaundice, no rashes  Imaging/Labs: as above  I personally reviewed and interpreted the available labs, imaging and endoscopic files.  Impression and  Plan: Lindsay Palmer is a 82 y.o. 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  The patient has presented chronic nausea after having meals for multiple years.  This has worsened recently.  She has had previous investigations which have shown possible delayed gastric emptying and an study performed in 2017.  We discussed the possibility of evaluating again her gastric emptying with a repeat study while off medications to assess if this has progressed in severity.  Both the patient and the husband are agreeable to proceed with this.  I asked them to hold the antiemetics for 1 week prior to her test to obtain reliable results.  We discussed that if  indeed has gastroparesis, there are limited options for the chronic management.  As she has a contraindication for domperidone, we could attempt using low-dose Reglan.  We discussed the potential side effects and benefits.  The patient is slightly hesitant to try this in the future but may reconsider depending on the findings on GES.  Schedule gastric emptying study Hold medication for vomiting one week before undergoing gastric emptying study  All questions were answered.      2018, MD Gastroenterology and Hepatology [facility]

## 2022-03-03 ENCOUNTER — Other Ambulatory Visit (HOSPITAL_COMMUNITY): Payer: Medicare Other

## 2022-03-03 ENCOUNTER — Telehealth: Payer: Self-pay | Admitting: *Deleted

## 2022-03-03 NOTE — Telephone Encounter (Signed)
Thanks Abigail Butts, will update that in the note.  She can continue this medication every 6 hours as needed, will need to hold this medication a week prior to gastric emptying study.

## 2022-03-03 NOTE — Telephone Encounter (Signed)
Thanks Mindy - I will let Dr. Jenetta Downer know.

## 2022-03-03 NOTE — Telephone Encounter (Signed)
Discussed with pt's husband ( on dpr ) that she should hole compazine one week prior to gastric emptying study. It is scheduled for next Friday. Husband reports she usually takes that med twice a day every day and she is really sick today. Wants to know if there is something else she can take.

## 2022-03-03 NOTE — Telephone Encounter (Signed)
patient seen yesterday and was asked to call back with name of nausea med she is taking. She is taking compazine 10mg  for nausea

## 2022-03-03 NOTE — Telephone Encounter (Signed)
Unfortunately, if she takes medications prior to the test, it can affect the result and cause a false negative/positive

## 2022-03-03 NOTE — Telephone Encounter (Signed)
GES scheduled for 2/9, arrival 7:45am, npo midnight, no stomach meds after midnight. She is aware of appt details and had no questions.  Lindsay Palmer pt also stated she is taking compazine 10mg  for nausea

## 2022-03-06 ENCOUNTER — Telehealth (INDEPENDENT_AMBULATORY_CARE_PROVIDER_SITE_OTHER): Payer: Medicare Other | Admitting: Psychiatry

## 2022-03-06 ENCOUNTER — Encounter (HOSPITAL_COMMUNITY): Payer: Self-pay | Admitting: Psychiatry

## 2022-03-06 DIAGNOSIS — F3130 Bipolar disorder, current episode depressed, mild or moderate severity, unspecified: Secondary | ICD-10-CM

## 2022-03-06 MED ORDER — ARIPIPRAZOLE 10 MG PO TABS: 10.0000 mg | ORAL_TABLET | Freq: Every day | ORAL | 2 refills | Status: DC

## 2022-03-06 MED ORDER — ZOLPIDEM TARTRATE 10 MG PO TABS: 10.0000 mg | ORAL_TABLET | Freq: Every evening | ORAL | 2 refills | Status: DC | PRN

## 2022-03-06 MED ORDER — LAMOTRIGINE 200 MG PO TABS: 200.0000 mg | ORAL_TABLET | Freq: Every day | ORAL | 2 refills | Status: DC

## 2022-03-06 MED ORDER — LORAZEPAM 0.5 MG PO TABS: 0.5000 mg | ORAL_TABLET | Freq: Two times a day (BID) | ORAL | 5 refills | Status: DC | PRN

## 2022-03-06 NOTE — Progress Notes (Signed)
Virtual Visit via Telephone Note  I connected with Lindsay Palmer on 03/06/22 at  1:00 PM EST by telephone and verified that I am speaking with the correct person using two identifiers.  Location: Patient: home Provider: office   I discussed the limitations, risks, security and privacy concerns of performing an evaluation and management service by telephone and the availability of in person appointments. I also discussed with the patient that there may be a patient responsible charge related to this service. The patient expressed understanding and agreed to proceed.    I discussed the assessment and treatment plan with the patient. The patient was provided an opportunity to ask questions and all were answered. The patient agreed with the plan and demonstrated an understanding of the instructions.   The patient was advised to call back or seek an in-person evaluation if the symptoms worsen or if the condition fails to improve as anticipated.  I provided 15 minutes of non-face-to-face time during this encounter.   Lindsay Spiller, MD  [facility] MD/PA/NP OP Progress Note  03/06/2022 1:16 PM Lindsay Palmer  MRN:  [id number]  Chief Complaint:  Chief Complaint  Patient presents with   Depression   Anxiety   Follow-up   HPI: This patient is an 82 year old married white female who lives with her husband in [place].  She is retired from [employer].  She has 1 son, 1 daughter and 3 grandchildren.   The patient returns for follow-up after 6 months regarding her bipolar disorder anxiety and insomnia.  For the most part she is doing well.  She denies significant depression or manic symptoms such as racing thoughts or insomnia.  She is still having a lot of nausea and just went back to see GI.  She is going to have another gastric emptying study.  She never did start the trazodone but is sleeping fairly well with the Ambien.  She denies any thoughts of self-harm or suicide Visit Diagnosis:     ICD-10-CM   1. Bipolar I disorder, most recent episode depressed ([place])  F31.30       Past Psychiatric History: Long-term outpatient treatment for bipolar disorder  Past Medical History:  Past Medical History:  Diagnosis Date   Bipolar 2 disorder ([place])    Chronic insomnia    Congestive heart failure (CHF) ([place]) 12/2019   Diverticulosis of colon    Dyspnea    Elevated LFTs    Fatty liver disease, nonalcoholic    GERD (gastroesophageal reflux disease)    H/O first degree atrioventricular block    Hypercholesteremia    Hypertension    Major depressive disorder    Migraine    Osteopenia    Pancreatitis    Pneumonia    Sleep apnea    Stop Bang score of 4. Pt said she had sleep apnea at one time, but she has had another sleep study since then and they said she does not have it anymore.   Vitamin D deficiency     Past Surgical History:  Procedure Laterality Date   BILIARY STENT PLACEMENT N/A 07/18/2013   Procedure: BILIARY STENT PLACEMENT;  Surgeon: Rogene Houston, MD;  Location: [facility];  Service: Endoscopy;  Laterality: N/A;   BIOPSY  03/24/2020   Procedure: BIOPSY;  Surgeon: Rogene Houston, MD;  Location[facility];  Service: Endoscopy;;   Cataract Surgery Bilateral 08-2012   CHOLECYSTECTOMY     COLONOSCOPY  2003   Diverticulitis h/o polyps -due next 2015 -  gastroscopy 2003   COLONOSCOPY WITH PROPOFOL N/A 03/24/2020   Procedure: COLONOSCOPY WITH PROPOFOL;  Surgeon: Rogene Houston, MD;  [facility];  Service: Endoscopy;  Laterality: N/A;  9:00, pt tested + 1/25 <90 days   ERCP     ERCP N/A 07/18/2013   Procedure: ENDOSCOPIC RETROGRADE CHOLANGIOPANCREATOGRAPHY (ERCP) COMMON BILE DUCT BRUSHING;  Surgeon: Rogene Houston, MD;  Location: [facility];  Service: Endoscopy;  Laterality: N/A;   ERCP N/A 11/07/2013   Procedure: ENDOSCOPIC RETROGRADE CHOLANGIOPANCREATOGRAPHY (ERCP);  Surgeon: Rogene Houston, MD;  Location: [facility];  Service: Endoscopy;  Laterality: N/A;    ESOPHAGEAL DILATION N/A 08/23/2016   Procedure: ESOPHAGEAL DILATION;  Surgeon: Rogene Houston, MD;  [facility];  Service: Endoscopy;  Laterality: N/A;   ESOPHAGOGASTRODUODENOSCOPY N/A 08/23/2016   Procedure: ESOPHAGOGASTRODUODENOSCOPY (EGD);  Surgeon: Rogene Houston, MD;  [facility];  Service: Endoscopy;  Laterality: N/A;  245-moved up to 100 per Lelon Frohlich   ESOPHAGOGASTRODUODENOSCOPY (EGD) WITH PROPOFOL N/A 03/24/2020   Procedure: ESOPHAGOGASTRODUODENOSCOPY (EGD) WITH PROPOFOL;  Surgeon: Rogene Houston, MD;  [facility];  Service: Endoscopy;  Laterality: N/A;   EYE SURGERY Bilateral    cataract removal   NECK SURGERY     Stenosis C6-C7   PARTIAL COLECTOMY  2018   SIGMOIDOSCOPY  2007   no polyps   SPHINCTEROTOMY N/A 07/18/2013   Procedure: SPHINCTEROTOMY;  Surgeon: Rogene Houston, MD;  Location: [facility];  Service: Endoscopy;  Laterality: N/A;   SPHINCTEROTOMY  11/07/2013   Procedure: EXTENSION OF SPHINCTEROTOMY;  Surgeon: Rogene Houston, MD;  Location: [facility];  Service: Endoscopy;;   STENT REMOVAL N/A 11/07/2013   Procedure: STENT REMOVAL;  Surgeon: Rogene Houston, MD;  Location: [facility];  Service: Endoscopy;  Laterality: N/A;    Family Psychiatric History: See below  Family History:  Family History  Problem Relation Age of Onset   Diabetes Mother    Coronary artery disease Father    Gout Father    Breast cancer Sister    Pancreatic cancer Brother    Alcohol abuse Brother    Healthy Brother    Lung cancer Sister    Healthy Sister    Depression Sister    Hyperlipidemia Daughter    Hyperlipidemia Son    Bipolar disorder Son    Healthy Sister    Healthy Sister    Seizures Other     Social History:  Social History   Socioeconomic History   Marital status: Married    Spouse name: Not on file   Number of children: Not on file   Years of education: Not on file   Highest education level: Not on file  Occupational History   Not on file   Tobacco Use   Smoking status: Never    Passive exposure: Never   Smokeless tobacco: Never  Vaping Use   Vaping Use: Never used  Substance and Sexual Activity   Alcohol use: No    Alcohol/week: 0.0 standard drinks of alcohol   Drug use: No   Sexual activity: Not on file  Other Topics Concern   Not on file  Social History Narrative   Not on file   Social Determinants of Health   Financial Resource Strain: Not on file  Food Insecurity: Not on file  Transportation Needs: Not on file  Physical Activity: Not on file  Stress: Not on file  Social Connections: Not on file    Allergies:  Allergies  Allergen Reactions   Codeine Nausea And Vomiting   Ciprofloxacin Nausea And Vomiting   Flagyl [Metronidazole] Nausea And Vomiting   Oxycodone Nausea And Vomiting    Metabolic Disorder Labs: No results found for: "HGBA1C", "MPG" No results found for: "PROLACTIN" Lab Results  Component Value Date   TRIG 113 07/28/2013   Lab Results  Component Value Date   TSH 1.38 02/08/2021   TSH 1.213 09/14/2014    Therapeutic Level Labs: No results found for: "LITHIUM" No results found for: "VALPROATE" No results found for: "CBMZ"  Current Medications: Current Outpatient Medications  Medication Sig Dispense Refill   acetaminophen (TYLENOL) 500 MG tablet Take 500-1,000 mg by mouth every 6 (six) hours as needed (pain).     ARIPiprazole (ABILIFY) 10 MG tablet Take 1 tablet (10 mg total) by mouth at bedtime. 90 tablet 2   carvedilol (COREG) 25 MG tablet Take 25 mg by mouth 2 (two) times daily with a meal. LUNCH & EVENING     Cholecalciferol (VITAMIN D3) 10 MCG (400 UNIT) CAPS Take 400 Units by mouth daily.     esomeprazole (NEXIUM) 40 MG capsule TAKE 1 CAPSULE BY MOUTH ONCE DAILY BEFORE BREAKFAST 90 capsule 3   ferrous sulfate (FERROUSUL) 325 (65 FE) MG tablet Take 1 tablet (325 mg total) by mouth 2 (two) times daily. (Patient taking differently: Take 325 mg by mouth daily with breakfast.)   0   hydrALAZINE (APRESOLINE) 100 MG tablet Take 100 mg by mouth 2 (two) times daily.     hyoscyamine (LEVSIN SL) 0.125 MG SL tablet PLACE 1-2 TABLETS UNDER THE TONGUE 2 (TWO) TIMES DAILY AS NEEDED. 120 tablet 2   lamoTRIgine (LAMICTAL) 200 MG tablet Take 1 tablet (200 mg total) by mouth daily. 90 tablet 2   LORazepam (ATIVAN) 0.5 MG tablet Take 1 tablet (0.5 mg total) by mouth 2 (two) times daily as needed for anxiety. 60 tablet 5   losartan (COZAAR) 100 MG tablet Take 100 mg by mouth daily.     meloxicam (MOBIC) 15 MG tablet Take 1 tablet (15 mg total) by mouth daily.     NON FORMULARY DOMPERIDONE 10MG   TAKE ONE BID FOR 15 DAYS - ordered by Dr. Laural Golden on 10/05/20. [facility]. (Patient not taking: Reported on 02/08/2021)     Omega-3 Fatty Acids (FISH OIL PO) Take 1 capsule by mouth in the morning and at bedtime.     ondansetron (ZOFRAN) 4 MG tablet TAKE 1 TABLET BY MOUTH EVERY 8 HOURS AS NEEDED FOR NAUSEA AND VOMITING 60 tablet 4   OVER THE COUNTER MEDICATION Vit D 400 units capsule daily     polyethylene glycol powder (GLYCOLAX/MIRALAX) 17 GM/SCOOP powder Take 17 grams daily prn for Constipation. 1581 g 5   potassium chloride (MICRO-K) 10 MEQ CR capsule TAKE 2 CAPSULES BY MOUTH DAILY AS NEEDED ( TAKE ON DAYS WHEN YOU TAKE TORSEMIDE) 14 capsule 0   prochlorperazine (COMPAZINE) 10 MG tablet TAKE 1 TABLET BY MOUTH EVERY 6 HOURS AS NEEDED FOR NAUSEA FOR VOMITING 30 tablet 2   simvastatin (ZOCOR) 20 MG tablet Take 20 mg by mouth daily with lunch.     torsemide (DEMADEX) 20 MG tablet Take 20 mg by mouth every other day.     vitamin B-12 (CYANOCOBALAMIN) 1000 MCG tablet Take 1,000 mcg by mouth every evening.     zolpidem (AMBIEN) 10 MG tablet Take 1 tablet (10 mg total) by mouth at bedtime as needed. for sleep 30 tablet  2   No current facility-administered medications for this visit.     Musculoskeletal: Strength & Muscle Tone: na Gait & Station: na Patient leans: N/A  Psychiatric  Specialty Exam: Review of Systems  Gastrointestinal:  Positive for nausea.  All other systems reviewed and are negative.   There were no vitals taken for this visit.There is no height or weight on file to calculate BMI.  General Appearance: NA  Eye Contact:  NA  Speech:  Clear and Coherent  Volume:  Normal  Mood:  Euthymic  Affect:  NA  Thought Process:  Goal Directed  Orientation:  Full (Time, Place, and Person)  Thought Content: WDL   Suicidal Thoughts:  No  Homicidal Thoughts:  No  Memory:  Immediate;   Good Recent;   Good Remote;   Fair  Judgement:  Good  Insight:  Fair  Psychomotor Activity:  Decreased  Concentration:  Concentration: Fair and Attention Span: Fair  Recall:  Good  Fund of Knowledge: Good  Language: Good  Akathisia:  No  Handed:  Right  AIMS (if indicated): not done  Assets:  Communication Skills Desire for Improvement Resilience Social Support  ADL's:  Intact  Cognition: WNL  Sleep:  Good   Screenings: PHQ2-9    Flowsheet Row Video Visit from 09/05/2021 in [facility] [place] Video Visit from 03/08/2021 in [facility] at [place] Video Visit from 09/06/2020 in [facility] at [facility] Total Score 1 0 1      Flowsheet Row Video Visit from 09/05/2021 in [facility] at [place] Video Visit from 03/08/2021 in [facility] at [place] ED from 02/11/2021 in [facility] Emergency Department at [facility]  C-SSRS RISK CATEGORY No Risk No Risk No Risk        Assessment and Plan: This patient is an 82 year old female with a history of bipolar disorder she continues to do well on her current regimen.  She will continue Abilify 10 mg at bedtime for mood stabilization, Lamictal 200 mg daily for mood stabilization and Ambien 10 mg at bedtime for sleep as well as lorazepam 0.5 mg twice daily as  needed for anxiety.  She will return to see me in 6 months  Collaboration of Care: Collaboration of Care: Other provider involved in patient's care AEB notes are shared with GI on the epic system  Patient/Guardian was advised Release of Information must be obtained prior to any record release in order to collaborate their care with an outside provider. Patient/Guardian was advised if they have not already done so to contact the registration department to sign all necessary forms in order for [place] to release information regarding their care.   Consent: Patient/Guardian gives verbal consent for treatment and assignment of benefits for services provided during this visit. Patient/Guardian expressed understanding and agreed to proceed.    Diannia Ruder, MD 03/06/2022, 1:16 PM

## 2022-03-06 NOTE — Telephone Encounter (Signed)
Discussed with patient and she verbalized understanding.  

## 2022-03-10 ENCOUNTER — Encounter (HOSPITAL_COMMUNITY): Payer: Self-pay

## 2022-03-10 ENCOUNTER — Encounter (HOSPITAL_COMMUNITY)
Admission: RE | Admit: 2022-03-10 | Discharge: 2022-03-10 | Disposition: A | Discharge status: Home / Self Care | Payer: Medicare Other | Source: Ambulatory Visit | Attending: Gastroenterology | Admitting: Gastroenterology

## 2022-03-10 DIAGNOSIS — R110 Nausea: Secondary | ICD-10-CM | POA: Diagnosis present

## 2022-03-10 MED ORDER — TECHNETIUM TC 99M SULFUR COLLOID
2.0000 | Freq: Once | INTRAVENOUS | Status: AC | PRN
  Administered 2022-03-10: 2.2 via ORAL

## 2022-03-13 ENCOUNTER — Other Ambulatory Visit (INDEPENDENT_AMBULATORY_CARE_PROVIDER_SITE_OTHER): Payer: Self-pay | Admitting: *Deleted

## 2022-03-13 DIAGNOSIS — K863 Pseudocyst of pancreas: Secondary | ICD-10-CM

## 2022-03-30 ENCOUNTER — Other Ambulatory Visit (INDEPENDENT_AMBULATORY_CARE_PROVIDER_SITE_OTHER): Payer: Self-pay | Admitting: Gastroenterology

## 2022-03-30 ENCOUNTER — Telehealth (INDEPENDENT_AMBULATORY_CARE_PROVIDER_SITE_OTHER): Payer: Self-pay | Admitting: *Deleted

## 2022-03-30 DIAGNOSIS — R112 Nausea with vomiting, unspecified: Secondary | ICD-10-CM

## 2022-03-30 MED ORDER — METOCLOPRAMIDE HCL 5 MG PO TABS: 5.0000 mg | ORAL_TABLET | Freq: Three times a day (TID) | ORAL | 3 refills | Status: DC

## 2022-03-30 NOTE — Telephone Encounter (Signed)
Pt's husband called to get lab results of test done last Thursday done at [place] at [address]. Phone number [phone]. I  called and spoke to [place] at [place] and she told me she would fax over her results.  Await results.

## 2022-03-30 NOTE — Telephone Encounter (Signed)
Husband also wanted to let Dr. Jenetta Downer know she has not been able to eat much this week and lays in the bed most of the time. He said some weeks are better than others. Has nausea and nausea med given does not really helping.   [phone]

## 2022-03-30 NOTE — Telephone Encounter (Signed)
Spoke to patient today, still presenting recurrent nausea despite taking Zofran and Compazine.  Will switch her to Reglan 5 mg 3 times daily.  I advised to stop the other medications for now.  She is set up an appointment with another GI and was facility for a second opinion which I find reasonable.  May consider doing a CT of the head and repeat CT of the abdomen and pelvis with IV contrast if symptoms persist and she wants to continue her care in our office.

## 2022-03-31 NOTE — Telephone Encounter (Signed)
Received cortisol level from [facility] ( 15.9 ) result put on your desk.

## 2022-03-31 NOTE — Telephone Encounter (Signed)
Thanks, I had received this in the past, result was normal Thanks

## 2022-03-31 NOTE — Telephone Encounter (Signed)
Called [facility] and let Lattie Haw at lab know we still did not receive labs and she told me she would fax again.

## 2022-04-03 NOTE — Telephone Encounter (Signed)
Patient aware lab was normal.

## 2022-06-01 ENCOUNTER — Other Ambulatory Visit (HOSPITAL_COMMUNITY): Payer: Self-pay | Admitting: Psychiatry

## 2022-06-14 ENCOUNTER — Other Ambulatory Visit (HOSPITAL_COMMUNITY): Payer: Self-pay | Admitting: Psychiatry

## 2022-06-19 ENCOUNTER — Other Ambulatory Visit (HOSPITAL_COMMUNITY): Payer: Self-pay | Admitting: Psychiatry

## 2022-06-19 ENCOUNTER — Telehealth (INDEPENDENT_AMBULATORY_CARE_PROVIDER_SITE_OTHER): Payer: Self-pay | Admitting: *Deleted

## 2022-06-19 ENCOUNTER — Ambulatory Visit (INDEPENDENT_AMBULATORY_CARE_PROVIDER_SITE_OTHER): Payer: Medicare Other | Admitting: Gastroenterology

## 2022-06-19 ENCOUNTER — Other Ambulatory Visit (INDEPENDENT_AMBULATORY_CARE_PROVIDER_SITE_OTHER): Payer: Self-pay | Admitting: Gastroenterology

## 2022-06-19 DIAGNOSIS — R112 Nausea with vomiting, unspecified: Secondary | ICD-10-CM

## 2022-06-19 MED ORDER — PROMETHAZINE HCL 12.5 MG PO TABS: 12.5000 mg | ORAL_TABLET | Freq: Three times a day (TID) | ORAL | 3 refills | Status: DC | PRN

## 2022-06-19 NOTE — Telephone Encounter (Signed)
Medication refilled

## 2022-06-19 NOTE — Telephone Encounter (Signed)
Patient's husband called to clarify which med she is suppose to be taking for nausea. She has been taking promethazine 12.5mg  and husband states she did not try metoclopramide because she was worried about tremors from med. States she already has tremors from taking abilify. Note states zofran and compazine has not helped in the past and she wanted to know if you would fill promethazine 12.5mg .   Hedwig Morton  [phone]

## 2022-06-19 NOTE — Telephone Encounter (Signed)
Patient notifed promethazine sent to pharmacy

## 2022-06-19 NOTE — Telephone Encounter (Signed)
So he wants to have compazine refilled? We can try a higher dose, but she will need to stop zofran

## 2022-06-19 NOTE — Telephone Encounter (Signed)
She wants promethazine 12.5 mg refilled.

## 2022-06-28 ENCOUNTER — Other Ambulatory Visit (HOSPITAL_COMMUNITY): Payer: Self-pay | Admitting: Psychiatry

## 2022-07-17 ENCOUNTER — Ambulatory Visit (INDEPENDENT_AMBULATORY_CARE_PROVIDER_SITE_OTHER): Payer: Medicare Other | Admitting: Gastroenterology

## 2022-07-17 ENCOUNTER — Encounter (INDEPENDENT_AMBULATORY_CARE_PROVIDER_SITE_OTHER): Payer: Self-pay | Admitting: Gastroenterology

## 2022-07-17 ENCOUNTER — Telehealth (INDEPENDENT_AMBULATORY_CARE_PROVIDER_SITE_OTHER): Payer: Self-pay | Admitting: Gastroenterology

## 2022-07-17 VITALS — BP 180/58 | HR 68 | Temp 97.8°F | Ht 64.0 in | Wt 180.2 lb

## 2022-07-17 DIAGNOSIS — R634 Abnormal weight loss: Secondary | ICD-10-CM | POA: Diagnosis not present

## 2022-07-17 DIAGNOSIS — R519 Headache, unspecified: Secondary | ICD-10-CM | POA: Diagnosis not present

## 2022-07-17 DIAGNOSIS — R110 Nausea: Secondary | ICD-10-CM

## 2022-07-17 MED ORDER — METOCLOPRAMIDE HCL 5 MG PO TABS
5.0000 mg | ORAL_TABLET | Freq: Three times a day (TID) | ORAL | 3 refills | Status: DC | PRN
Start: 2022-07-17 — End: 2022-09-06

## 2022-07-17 NOTE — Telephone Encounter (Signed)
Pt in office today and needing CT head w and w/o contrast. Pt would like this done at [facility]. Order printed out and placed on provider desk to be faxed. Will fax once signed.

## 2022-07-17 NOTE — Progress Notes (Signed)
Katrinka Blazing, M.D. Gastroenterology & Hepatology [facility] [address] [place], [place] [address]  Primary Care Physician: Garald Braver [address] [place] [address]  I will communicate my assessment and recommendations to the referring MD via EMR.  Problems: Chronic postprandial  nausea History of post ERCP pancreatitis H/o perforated diverticulitis   History of Present Illness: Lindsay Palmer is a 82 y.o. 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  The patient was last seen on 03/02/2022. At that time, the patient is scheduled for a gastric emptying study.  This was performed on 03/10/2022 which was within normal limits.  She had an 8 AM cortisol test performed which was normal x 2.  Patient reports that she is having nausea all the time. She does not vomit. She takes promethazine almost on a daily basis, which sometimes helps relieving her symptoms, although sometimes she has to take 2 pills to relieve the nausea. She feels very aggravated when the episodes are present. She has lost 8 lb since the last time she was in clinic as she was feeling very symptomatic.  The patient denies having any nausea, vomiting, fever, chills, hematochezia, melena, hematemesis, abdominal distention, abdominal pain, diarrhea, jaundice, pruritus or weight loss.  Reports having some intermittent episodes of headaches chronically.  She did not tell her husband about this but has presented this chronically.  Patient states she can have some episodes of mid abdominal pain intermittently on some occasions, but this is not related to food  intake exclusively.  Previous workup includes a negative gastric emptying study in 2024, CT scan of the abdomen and pelvis with IV contrast on 2023 showing showed pneumobilia secondary to previous sphincterotomy and atrophy pancreatitis, evidence of prior colon surgery.  EGD with findings described below.  Normal cortisol levels in the past.  Notably, the patient saw Dr. Hebert Soho at [facility].  She recommended starting Metamucil and MiraLAX.  Also discussed the possibility of starting low-dose Elavil for possible bowel hypersensitivity.  Last EGD: 03/2020 - Normal esophagus. - Z-line regular, 37 cm from the incisors. - 3 cm hiatal hernia. - Gastritis. Biopsied. - Normal duodenal bulb and second portion of the duodenum. Biopsied.   Last Colonoscopy: 03/2020 - Diverticulosis in the proximal sigmoid colon. - Patent end-to-end colo-colonic anastomosis. - External hemorrhoids.   Path: A. STOMACH, BIOPSY:  - Mild chronic gastritis and reactive gastropathy.  - Intestinal metaplasia is present focally in antrum, negative for  dysplasia.  - Warthin-Starry stain is negative for Helicobacter pylori.   B. DUODENUM, BIOPSY:  - Duodenal mucosa with no significant pathologic findings.  - Negative for increased intraepithelial lymphocytes and villous  architectural changes.   Past Medical History: Past Medical History:  Diagnosis Date   Bipolar 2 disorder (HCC)    Chronic insomnia    Congestive heart failure (CHF) (HCC) 12/2019   Diverticulosis of colon    Dyspnea    Elevated LFTs    Fatty liver disease, nonalcoholic    GERD (gastroesophageal reflux disease)    H/O first degree atrioventricular block    Hypercholesteremia    Hypertension    Major depressive disorder    Migraine    Osteopenia    Pancreatitis    Pneumonia  Sleep apnea    Stop Bang score of 4. Pt said she had sleep apnea at one time, but she has had another sleep study since then and they said she does not  have it anymore.   Vitamin D deficiency     Past Surgical History: Past Surgical History:  Procedure Laterality Date   BILIARY STENT PLACEMENT N/A 07/18/2013   Procedure: BILIARY STENT PLACEMENT;  Surgeon: Malissa Hippo, MD;  Location: [facility];  Service: Endoscopy;  Laterality: N/A;   BIOPSY  03/24/2020   Procedure: BIOPSY;  Surgeon: Malissa Hippo, MD;  [facility];  Service: Endoscopy;;   Cataract Surgery Bilateral 08-2012   CHOLECYSTECTOMY     COLONOSCOPY  2003   Diverticulitis h/o polyps -due next 2015 -gastroscopy 2003   COLONOSCOPY WITH PROPOFOL N/A 03/24/2020   Procedure: COLONOSCOPY WITH PROPOFOL;  Surgeon: Malissa Hippo, MD;  [facility]: [facility];  Service: Endoscopy;  Laterality: N/A;  9:00, pt tested + 1/25 <90 days   ERCP     ERCP N/A 07/18/2013   Procedure: ENDOSCOPIC RETROGRADE CHOLANGIOPANCREATOGRAPHY (ERCP) COMMON BILE DUCT BRUSHING;  Surgeon: Malissa Hippo, MD;  Location: [facility];  Service: Endoscopy;  Laterality: N/A;   ERCP N/A 11/07/2013   Procedure: ENDOSCOPIC RETROGRADE CHOLANGIOPANCREATOGRAPHY (ERCP);  Surgeon: Malissa Hippo, MD;  Location: [facility];  Service: Endoscopy;  Laterality: N/A;   ESOPHAGEAL DILATION N/A 08/23/2016   Procedure: ESOPHAGEAL DILATION;  Surgeon: Malissa Hippo, MD;  [facility];  Service: Endoscopy;  Laterality: N/A;   ESOPHAGOGASTRODUODENOSCOPY N/A 08/23/2016   Procedure: ESOPHAGOGASTRODUODENOSCOPY (EGD);  Surgeon: Malissa Hippo, MD;  [facility];  Service: Endoscopy;  Laterality: N/A;  245-moved up to 100 per [name]   ESOPHAGOGASTRODUODENOSCOPY (EGD) WITH PROPOFOL N/A 03/24/2020   Procedure: ESOPHAGOGASTRODUODENOSCOPY (EGD) WITH PROPOFOL;  Surgeon: Malissa Hippo, MD;  [facility];  Service: Endoscopy;  Laterality: N/A;   EYE SURGERY Bilateral    cataract removal   NECK SURGERY     Stenosis C6-C7   PARTIAL COLECTOMY  2018   SIGMOIDOSCOPY  2007   no polyps   SPHINCTEROTOMY N/A  07/18/2013   Procedure: SPHINCTEROTOMY;  Surgeon: Malissa Hippo, MD;  Location: [facility];  Service: Endoscopy;  Laterality: N/A;   SPHINCTEROTOMY  11/07/2013   Procedure: EXTENSION OF SPHINCTEROTOMY;  Surgeon: Malissa Hippo, MD;  Location: [facility];  Service: Endoscopy;;   STENT REMOVAL N/A 11/07/2013   Procedure: STENT REMOVAL;  Surgeon: Malissa Hippo, MD;  Location: [facility];  Service: Endoscopy;  Laterality: N/A;    Family History: Family History  Problem Relation Age of Onset   Diabetes Mother    Coronary artery disease Father    Gout Father    Breast cancer Sister    Pancreatic cancer Brother    Alcohol abuse Brother    Healthy Brother    Lung cancer Sister    Healthy Sister    Depression Sister    Hyperlipidemia Daughter    Hyperlipidemia Son    Bipolar disorder Son    Healthy Sister    Healthy Sister    Seizures Other     Social History: Social History   Tobacco Use  Smoking Status Never   Passive exposure: Never  Smokeless Tobacco Never   Social History   Substance and Sexual Activity  Alcohol Use No   Alcohol/week: 0.0 standard drinks of alcohol   Social History   Substance and Sexual Activity  Drug Use No    Allergies: Allergies  Allergen Reactions   Codeine Nausea And Vomiting   Ciprofloxacin Nausea And Vomiting   Flagyl [Metronidazole] Nausea And Vomiting   Oxycodone Nausea And Vomiting    Medications: Current Outpatient Medications  Medication Sig Dispense Refill   acetaminophen (TYLENOL) 500 MG tablet Take 500-1,000 mg by mouth every 6 (six) hours as needed (pain).     ARIPiprazole (ABILIFY) 10 MG tablet Take 1 tablet (10 mg total) by mouth at bedtime. 90 tablet 2   carvedilol (COREG) 25 MG tablet Take 25 mg by mouth 2 (two) times daily with a meal. LUNCH & EVENING     Cholecalciferol (VITAMIN D3) 10 MCG (400 UNIT) CAPS Take 400 Units by mouth daily.     esomeprazole (NEXIUM) 40 MG capsule TAKE 1 CAPSULE BY MOUTH ONCE DAILY BEFORE BREAKFAST  90 capsule 3   ferrous sulfate (FERROUSUL) 325 (65 FE) MG tablet Take 1 tablet (325 mg total) by mouth 2 (two) times daily. (Patient taking differently: Take 325 mg by mouth daily with breakfast.)  0   hydrALAZINE (APRESOLINE) 100 MG tablet Take 100 mg by mouth 2 (two) times daily.     hyoscyamine (LEVSIN SL) 0.125 MG SL tablet PLACE 1-2 TABLETS UNDER THE TONGUE 2 (TWO) TIMES DAILY AS NEEDED. 120 tablet 2   lamoTRIgine (LAMICTAL) 200 MG tablet Take 1 tablet (200 mg total) by mouth daily. 90 tablet 2   LORazepam (ATIVAN) 0.5 MG tablet Take 1 tablet (0.5 mg total) by mouth 2 (two) times daily as needed for anxiety. 60 tablet 5   losartan (COZAAR) 100 MG tablet Take 100 mg by mouth daily.     meloxicam (MOBIC) 15 MG tablet Take 1 tablet (15 mg total) by mouth daily.     Omega-3 Fatty Acids (FISH OIL PO) Take 1 capsule by mouth in the morning and at bedtime.     OVER THE COUNTER MEDICATION Vit D 400 units capsule daily     potassium chloride (MICRO-K) 10 MEQ CR capsule TAKE 2 CAPSULES BY MOUTH DAILY AS NEEDED ( TAKE ON DAYS WHEN YOU TAKE TORSEMIDE) 14 capsule 0   promethazine (PHENERGAN) 12.5 MG tablet Take 1 tablet (12.5 mg total) by mouth every 8 (eight) hours as needed for nausea or vomiting. 90 tablet 3   simvastatin (ZOCOR) 20 MG tablet Take 20 mg by mouth daily with lunch.     torsemide (DEMADEX) 20 MG tablet Take 20 mg by mouth every other day.     vitamin B-12 (CYANOCOBALAMIN) 1000 MCG tablet Take 1,000 mcg by mouth every evening.     zolpidem (AMBIEN) 10 MG tablet TAKE 1 TABLET BY MOUTH AT BEDTIME AS NEEDED FOR SLEEP 30 tablet 4   polyethylene glycol powder (GLYCOLAX/MIRALAX) 17 GM/SCOOP powder Take 17 grams daily prn for Constipation. (Patient not taking: Reported on 07/17/2022) 1581 g 5   No current facility-administered medications for this visit.    Review of Systems: GENERAL: negative for malaise, night sweats HEENT: No changes in hearing or vision, no nose bleeds or other nasal  problems. NECK: Negative for lumps, goiter, pain and significant neck swelling RESPIRATORY: Negative for cough, wheezing CARDIOVASCULAR: Negative for chest pain, leg swelling, palpitations, orthopnea GI: SEE HPI MUSCULOSKELETAL: Negative for joint pain or swelling, back pain, and muscle pain. SKIN: Negative for lesions, rash PSYCH: Negative for sleep disturbance, mood disorder and recent psychosocial stressors. HEMATOLOGY Negative for prolonged bleeding, bruising easily, and swollen nodes. ENDOCRINE: Negative for cold or  heat intolerance, polyuria, polydipsia and goiter. NEURO: negative for tremor, gait imbalance, syncope and seizures. The remainder of the review of systems is noncontributory.   Physical Exam: BP (!) 180/58 (BP Location: Left Arm, Patient Position: Sitting, Cuff Size: Large)   Pulse 68   Temp 97.8 F (36.6 C) (Temporal)   Ht 5\' 4"  (1.626 m)   Wt 180 lb 3.2 oz (81.7 kg)   BMI 30.93 kg/m  GENERAL: The patient is AO x3, in no acute distress. Has flat affect. HEENT: Head is normocephalic and atraumatic. EOMI are intact. Mouth is well hydrated and without lesions. NECK: Supple. No masses LUNGS: Clear to auscultation. No presence of rhonchi/wheezing/rales. Adequate chest expansion HEART: RRR, normal s1 and s2. ABDOMEN: Soft, nontender, no guarding, no peritoneal signs, and nondistended. BS +. No masses. EXTREMITIES: Without any cyanosis, clubbing, rash, lesions or edema. NEUROLOGIC: AOx3, no focal motor deficit. SKIN: no jaundice, no rashes  Imaging/Labs: as above  I personally reviewed and interpreted the available labs, imaging and endoscopic files.  Impression and Plan: Lindsay Palmer is a 82 y.o. 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  Patient has presented chronic nausea without any red flag signs.  However she reports that episodes of nausea have been very debilitating and have led to poor oral intake.  She has had an extensive workup to evaluate this in the past but no abnormalities have been found on these investigations.  It is very possible her nausea is central in nature or related to her polypharmacy.  However, will evaluate for any intracranial pathology that could cause the persistent symptoms given the presence of episodes of headache.  For now, we will try Reglan 5 mg 3 times daily instead of promethazine as this may provide further symptom relief.  The patient is agreeable to try this at this time (previously she was fearful about worsening tremors with this medication but she has not felt any complete relief with different medications).  I explained to the patient and her husband she should discuss with her psychiatrist the possibility of adding low-dose antidepressant such as a TCA in the future.  This will need to be monitored by her psychiatrist as she is already taking Lamictal and Abilify.  Stop promethazine (Phenergan) Start Reglan 5 mg every 8 hours as needed  Schedule CT head with and without contrast  - Stop promethazine (Phenergan) Start Reglan 5 mg every 8 hours as needed  Schedule CT head with and without contrast -Could consider discussing with psychiatrist possibility of adding low-dose TCA to her current regimen if refractory symptoms.  All questions were answered.      Katrinka Blazing, MD Gastroenterology and Hepatology [facility]

## 2022-07-17 NOTE — Patient Instructions (Signed)
Stop promethazine (Phenergan) Start Reglan 5 mg every 8 hours as needed  Schedule CT head with and without contrast

## 2022-07-18 NOTE — Telephone Encounter (Signed)
CT order signed and faxed over to [facility] at [phone]

## 2022-07-25 ENCOUNTER — Telehealth (INDEPENDENT_AMBULATORY_CARE_PROVIDER_SITE_OTHER): Payer: Self-pay | Admitting: Gastroenterology

## 2022-07-25 NOTE — Telephone Encounter (Signed)
Hi Crystal,  Can you please call the patient and tell the patient the CT brain was normal?  Thanks,  Katrinka Blazing, MD Gastroenterology and Hepatology [facility]

## 2022-07-25 NOTE — Telephone Encounter (Signed)
Patient made aware her CT scan was normal.

## 2022-09-04 ENCOUNTER — Encounter (HOSPITAL_COMMUNITY): Payer: Self-pay | Admitting: Psychiatry

## 2022-09-04 ENCOUNTER — Telehealth (HOSPITAL_COMMUNITY): Payer: Medicare Other | Admitting: Psychiatry

## 2022-09-04 DIAGNOSIS — F3130 Bipolar disorder, current episode depressed, mild or moderate severity, unspecified: Secondary | ICD-10-CM

## 2022-09-04 MED ORDER — ARIPIPRAZOLE 10 MG PO TABS: 10.0000 mg | ORAL_TABLET | Freq: Every day | ORAL | 2 refills | Status: DC

## 2022-09-04 MED ORDER — LAMOTRIGINE 200 MG PO TABS: 200.0000 mg | ORAL_TABLET | Freq: Every day | ORAL | 2 refills | Status: DC

## 2022-09-04 MED ORDER — LORAZEPAM 0.5 MG PO TABS: 0.5000 mg | ORAL_TABLET | Freq: Two times a day (BID) | ORAL | 5 refills | Status: DC | PRN

## 2022-09-04 MED ORDER — TEMAZEPAM 15 MG PO CAPS: 15.0000 mg | ORAL_CAPSULE | Freq: Every evening | ORAL | 2 refills | Status: DC | PRN

## 2022-09-04 NOTE — Progress Notes (Signed)
Virtual Visit via Telephone Note  I connected with Lindsay Palmer on 09/04/22 at  1:00 PM EDT by telephone and verified that I am speaking with the correct person using two identifiers.  Location: Patient: home Provider: office   I discussed the limitations, risks, security and privacy concerns of performing an evaluation and management service by telephone and the availability of in person appointments. I also discussed with the patient that there may be a patient responsible charge related to this service. The patient expressed understanding and agreed to proceed.      I discussed the assessment and treatment plan with the patient. The patient was provided an opportunity to ask questions and all were answered. The patient agreed with the plan and demonstrated an understanding of the instructions.   The patient was advised to call back or seek an in-person evaluation if the symptoms worsen or if the condition fails to improve as anticipated.  I provided 15 minutes of non-face-to-face time during this encounter.   Diannia Ruder, MD  [facility] MD/PA/NP OP Progress Note  09/04/2022 1:16 PM Lindsay Palmer  MRN:  [id number]  Chief Complaint:  Chief Complaint  Patient presents with   Depression   Anxiety   Manic Behavior   Follow-up   HPI:  This patient is an 82 year old married white female who lives with her husband in [place].  She is retired from [employer]  She has 1 son, 1 daughter and 3 grandchildren.   The patient returns for follow-up after 6 months regarding her bipolar disorder anxiety and insomnia.  She states that her mood has been stable and she has been neither depressed nor manic.  However she still not sleeping well.  The Ambien is not working as well as it used to.  We had tried trazodone last year which also did not work.  I suggested we try temazepam as a is not good for someone with bipolar disorder to lose sleep.  She still having a lot of nausea and GI  cannot seem to find the cause.  She has constipation but no other symptoms.  She has lost about 8 pounds and often does not feel like eating much.  She denies any thoughts of self-harm or suicide Visit Diagnosis:    ICD-10-CM   1. Bipolar I disorder, most recent episode depressed (HCC)  F31.30       Past Psychiatric History: Long-term outpatient treatment for bipolar disorder  Past Medical History:  Past Medical History:  Diagnosis Date   Bipolar 2 disorder (HCC)    Chronic insomnia    Congestive heart failure (CHF) (HCC) 12/2019   Diverticulosis of colon    Dyspnea    Elevated LFTs    Fatty liver disease, nonalcoholic    GERD (gastroesophageal reflux disease)    H/O first degree atrioventricular block    Hypercholesteremia    Hypertension    Major depressive disorder    Migraine    Osteopenia    Pancreatitis    Pneumonia    Sleep apnea    Stop Bang score of 4. Pt said she had sleep apnea at one time, but she has had another sleep study since then and they said she does not have it anymore.   Vitamin D deficiency     Past Surgical History:  Procedure Laterality Date   BILIARY STENT PLACEMENT N/A 07/18/2013   Procedure: BILIARY STENT PLACEMENT;  Surgeon: Malissa Hippo, MD;  Location: [facility];  Service: Endoscopy;  Laterality: N/A;   BIOPSY  03/24/2020   Procedure: BIOPSY;  Surgeon: Malissa Hippo, MD;  [facility];  Service: Endoscopy;;   Cataract Surgery Bilateral 08-2012   CHOLECYSTECTOMY     COLONOSCOPY  2003   Diverticulitis h/o polyps -due next 2015 -gastroscopy 2003   COLONOSCOPY WITH PROPOFOL N/A 03/24/2020   Procedure: COLONOSCOPY WITH PROPOFOL;  Surgeon: Malissa Hippo, MD;  [facility];  Service: Endoscopy;  Laterality: N/A;  9:00, pt tested + 1/25 <90 days   ERCP     ERCP N/A 07/18/2013   Procedure: ENDOSCOPIC RETROGRADE CHOLANGIOPANCREATOGRAPHY (ERCP) COMMON BILE DUCT BRUSHING;  Surgeon: Malissa Hippo, MD;  Location: [facility];   Service: Endoscopy;  Laterality: N/A;   ERCP N/A 11/07/2013   Procedure: ENDOSCOPIC RETROGRADE CHOLANGIOPANCREATOGRAPHY (ERCP);  Surgeon: Malissa Hippo, MD;  Location: [facility];  Service: Endoscopy;  Laterality: N/A;   ESOPHAGEAL DILATION N/A 08/23/2016   Procedure: ESOPHAGEAL DILATION;  Surgeon: Malissa Hippo, MD;  [facility];  Service: Endoscopy;  Laterality: N/A;   ESOPHAGOGASTRODUODENOSCOPY N/A 08/23/2016   Procedure: ESOPHAGOGASTRODUODENOSCOPY (EGD);  Surgeon: Malissa Hippo, MD;  [facility];  Service: Endoscopy;  Laterality: N/A;  245-moved up to 100 per [name]   ESOPHAGOGASTRODUODENOSCOPY (EGD) WITH PROPOFOL N/A 03/24/2020   Procedure: ESOPHAGOGASTRODUODENOSCOPY (EGD) WITH PROPOFOL;  Surgeon: Malissa Hippo, MD;  [facility];  Service: Endoscopy;  Laterality: N/A;   EYE SURGERY Bilateral    cataract removal   NECK SURGERY     Stenosis C6-C7   PARTIAL COLECTOMY  2018   SIGMOIDOSCOPY  2007   no polyps   SPHINCTEROTOMY N/A 07/18/2013   Procedure: SPHINCTEROTOMY;  Surgeon: Malissa Hippo, MD;  Location: [facility];  Service: Endoscopy;  Laterality: N/A;   SPHINCTEROTOMY  11/07/2013   Procedure: EXTENSION OF SPHINCTEROTOMY;  Surgeon: Malissa Hippo, MD;  Location: [facility];  Service: Endoscopy;;   STENT REMOVAL N/A 11/07/2013   Procedure: STENT REMOVAL;  Surgeon: Malissa Hippo, MD;  Location: [facility];  Service: Endoscopy;  Laterality: N/A;    Family Psychiatric History: See below  Family History:  Family History  Problem Relation Age of Onset   Diabetes Mother    Coronary artery disease Father    Gout Father    Breast cancer Sister    Pancreatic cancer Brother    Alcohol abuse Brother    Healthy Brother    Lung cancer Sister    Healthy Sister    Depression Sister    Hyperlipidemia Daughter    Hyperlipidemia Son    Bipolar disorder Son    Healthy Sister    Healthy Sister    Seizures Other     Social History:  Social History    Socioeconomic History   Marital status: Married    Spouse name: Not on file   Number of children: Not on file   Years of education: Not on file   Highest education level: Not on file  Occupational History   Not on file  Tobacco Use   Smoking status: Never    Passive exposure: Never   Smokeless tobacco: Never  Vaping Use   Vaping status: Never Used  Substance and Sexual Activity   Alcohol use: No    Alcohol/week: 0.0 standard drinks of alcohol   Drug use: No   Sexual activity: Not on file  Other Topics Concern   Not on file  Social History Narrative   Not  on file   Social Determinants of Health   Financial Resource Strain: Not on file  Food Insecurity: Not on file  Transportation Needs: Not on file  Physical Activity: Not on file  Stress: Not on file  Social Connections: Not on file    Allergies:  Allergies  Allergen Reactions   Codeine Nausea And Vomiting   Ciprofloxacin Nausea And Vomiting   Flagyl [Metronidazole] Nausea And Vomiting   Oxycodone Nausea And Vomiting    Metabolic Disorder Labs: No results found for: "HGBA1C", "MPG" No results found for: "PROLACTIN" Lab Results  Component Value Date   TRIG 113 07/28/2013   Lab Results  Component Value Date   TSH 1.38 02/08/2021   TSH 1.213 09/14/2014    Therapeutic Level Labs: No results found for: "LITHIUM" No results found for: "VALPROATE" No results found for: "CBMZ"  Current Medications: Current Outpatient Medications  Medication Sig Dispense Refill   temazepam (RESTORIL) 15 MG capsule Take 1 capsule (15 mg total) by mouth at bedtime as needed for sleep. 30 capsule 2   acetaminophen (TYLENOL) 500 MG tablet Take 500-1,000 mg by mouth every 6 (six) hours as needed (pain).     ARIPiprazole (ABILIFY) 10 MG tablet Take 1 tablet (10 mg total) by mouth at bedtime. 90 tablet 2   carvedilol (COREG) 25 MG tablet Take 25 mg by mouth 2 (two) times daily with a meal. LUNCH & EVENING     Cholecalciferol  (VITAMIN D3) 10 MCG (400 UNIT) CAPS Take 400 Units by mouth daily.     esomeprazole (NEXIUM) 40 MG capsule TAKE 1 CAPSULE BY MOUTH ONCE DAILY BEFORE BREAKFAST 90 capsule 3   ferrous sulfate (FERROUSUL) 325 (65 FE) MG tablet Take 1 tablet (325 mg total) by mouth 2 (two) times daily. (Patient taking differently: Take 325 mg by mouth daily with breakfast.)  0   hydrALAZINE (APRESOLINE) 100 MG tablet Take 100 mg by mouth 2 (two) times daily.     hyoscyamine (LEVSIN SL) 0.125 MG SL tablet PLACE 1-2 TABLETS UNDER THE TONGUE 2 (TWO) TIMES DAILY AS NEEDED. 120 tablet 2   lamoTRIgine (LAMICTAL) 200 MG tablet Take 1 tablet (200 mg total) by mouth daily. 90 tablet 2   LORazepam (ATIVAN) 0.5 MG tablet Take 1 tablet (0.5 mg total) by mouth 2 (two) times daily as needed for anxiety. 60 tablet 5   losartan (COZAAR) 100 MG tablet Take 100 mg by mouth daily.     meloxicam (MOBIC) 15 MG tablet Take 1 tablet (15 mg total) by mouth daily.     metoCLOPramide (REGLAN) 5 MG tablet Take 1 tablet (5 mg total) by mouth every 8 (eight) hours as needed for nausea. 90 tablet 3   Omega-3 Fatty Acids (FISH OIL PO) Take 1 capsule by mouth in the morning and at bedtime.     OVER THE COUNTER MEDICATION Vit D 400 units capsule daily     polyethylene glycol powder (GLYCOLAX/MIRALAX) 17 GM/SCOOP powder Take 17 grams daily prn for Constipation. (Patient not taking: Reported on 07/17/2022) 1581 g 5   potassium chloride (MICRO-K) 10 MEQ CR capsule TAKE 2 CAPSULES BY MOUTH DAILY AS NEEDED ( TAKE ON DAYS WHEN YOU TAKE TORSEMIDE) 14 capsule 0   simvastatin (ZOCOR) 20 MG tablet Take 20 mg by mouth daily with lunch.     torsemide (DEMADEX) 20 MG tablet Take 20 mg by mouth every other day.     vitamin B-12 (CYANOCOBALAMIN) 1000 MCG tablet Take 1,000 mcg by mouth  every evening.     zolpidem (AMBIEN) 10 MG tablet TAKE 1 TABLET BY MOUTH AT BEDTIME AS NEEDED FOR SLEEP 30 tablet 4   No current facility-administered medications for this visit.      Musculoskeletal: Strength & Muscle Tone: na Gait & Station: na Patient leans: N/A  Psychiatric Specialty Exam: Review of Systems  Gastrointestinal:  Positive for constipation and nausea.  Psychiatric/Behavioral:  Positive for sleep disturbance.   All other systems reviewed and are negative.   There were no vitals taken for this visit.There is no height or weight on file to calculate BMI.  General Appearance: na  Eye Contact:  NA  Speech:  Clear and Coherent  Volume:  Normal  Mood:  Euthymic  Affect:  NA  Thought Process:  Goal Directed  Orientation:  Full (Time, Place, and Person)  Thought Content: WDL   Suicidal Thoughts:  No  Homicidal Thoughts:  No  Memory:  Immediate;   Good Recent;   Good Remote;   Fair  Judgement:  Good  Insight:  Fair  Psychomotor Activity:  Decreased  Concentration:  Concentration: Good and Attention Span: Good  Recall:  Good  Fund of Knowledge: Good  Language: Good  Akathisia:  No  Handed:  Right  AIMS (if indicated): not done  Assets:  Communication Skills Desire for Improvement Resilience Social Support Talents/Skills  ADL's:  Intact  Cognition: WNL  Sleep:  Poor   Screenings: PHQ2-9    Flowsheet Row Video Visit from 09/05/2021 in [facility] [place] Video Visit from 03/08/2021 in [facility] [place] Video Visit from 09/06/2020 in [facility] Total Score 1 0 1      Flowsheet Row Video Visit from 09/05/2021 in [facility] [place] Video Visit from 03/08/2021 in [facility] at [place] ED from 02/11/2021 in [facility] at [facility]  C-SSRS RISK CATEGORY No Risk No Risk No Risk        Assessment and Plan: This patient is an 82 year old female with history bipolar disorder.  In terms of mood she is doing well but not so well  for sleep.  She will discontinue Ambien and substitute temazepam 15 mg at bedtime for sleep.  She will continue Abilify 10 mg at bedtime for mood stabilization, Lamictal 200 mg daily for mood stabilization and lorazepam 0.5 mg twice daily as needed for anxiety.  She will return to see me in 6 months  Collaboration of Care: Collaboration of Care: Other provider involved in patient's care AEB notes are shared with GI on the epic system  Patient/Guardian was advised Release of Information must be obtained prior to any record release in order to collaborate their care with an outside provider. Patient/Guardian was advised if they have not already done so to contact the registration department to sign all necessary forms in order for [place] to release information regarding their care.   Consent: Patient/Guardian gives verbal consent for treatment and assignment of benefits for services provided during this visit. Patient/Guardian expressed understanding and agreed to proceed.    Diannia Ruder, MD 09/04/2022, 1:16 PM

## 2022-09-06 ENCOUNTER — Other Ambulatory Visit (INDEPENDENT_AMBULATORY_CARE_PROVIDER_SITE_OTHER): Payer: Self-pay

## 2022-09-06 ENCOUNTER — Telehealth (INDEPENDENT_AMBULATORY_CARE_PROVIDER_SITE_OTHER): Payer: Self-pay

## 2022-09-06 DIAGNOSIS — R110 Nausea: Secondary | ICD-10-CM

## 2022-09-06 DIAGNOSIS — R634 Abnormal weight loss: Secondary | ICD-10-CM

## 2022-09-06 MED ORDER — METOCLOPRAMIDE HCL 5 MG PO TABS
5.0000 mg | ORAL_TABLET | Freq: Three times a day (TID) | ORAL | 3 refills | Status: AC | PRN
Start: 2022-09-06 — End: ?

## 2022-09-06 NOTE — Telephone Encounter (Signed)
Patient husband says the patient was supposed to have started on Reglan,but was hesitant as it could cause tremors. He says this was not sent to the pharmacy. I looked and seen that it was,but I went ahead and reordered as per your previous message from the last office visit on 07/17/2022. Patient is now ready to try.   Stop promethazine (Phenergan) Start Reglan 5 mg every 8 hours as needed  Schedule CT head with and without contrast

## 2022-09-06 NOTE — Telephone Encounter (Signed)
Thanks

## 2022-10-30 ENCOUNTER — Ambulatory Visit (INDEPENDENT_AMBULATORY_CARE_PROVIDER_SITE_OTHER): Payer: Medicare Other | Admitting: Gastroenterology

## 2022-11-02 ENCOUNTER — Telehealth (INDEPENDENT_AMBULATORY_CARE_PROVIDER_SITE_OTHER): Payer: Self-pay | Admitting: Gastroenterology

## 2022-11-02 ENCOUNTER — Ambulatory Visit (INDEPENDENT_AMBULATORY_CARE_PROVIDER_SITE_OTHER): Payer: Medicare Other | Admitting: Gastroenterology

## 2022-11-02 ENCOUNTER — Encounter (INDEPENDENT_AMBULATORY_CARE_PROVIDER_SITE_OTHER): Payer: Self-pay | Admitting: Gastroenterology

## 2022-11-02 VITALS — BP 180/51 | HR 71 | Temp 97.1°F | Ht 64.0 in | Wt 180.4 lb

## 2022-11-02 DIAGNOSIS — R110 Nausea: Secondary | ICD-10-CM | POA: Diagnosis not present

## 2022-11-02 DIAGNOSIS — R1033 Periumbilical pain: Secondary | ICD-10-CM

## 2022-11-02 NOTE — Patient Instructions (Signed)
Schedule CT abdomen/pelvis with IV contrast Stop metoclopramide, if worsening nausea please restart medication

## 2022-11-02 NOTE — Telephone Encounter (Signed)
CT order faxed to [facility]

## 2022-11-02 NOTE — Progress Notes (Signed)
Lindsay Palmer, M.D. Gastroenterology & Hepatology [facility] Gastroenterology [address] [place], [place] [address]  Primary Care Physician: Lindsay Palmer [address] [place] [address]  I will communicate my assessment and recommendations to the referring MD via EMR.  Problems: Chronic postprandial  nausea History of post ERCP pancreatitis H/o perforated diverticulitis   History of Present Illness: Lindsay Palmer is a 82 y.o. 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  The patient was last seen on 07/17/2022. At that time, the patient was started on Reglan 5 mg every 8 hours as needed.  A CT brain was performed which was within normal limits.   Patient reports that she started taking her Reglan recently but has not noticed any significant improvement. She has nausea on a daily basis despite taking medications.  The patient reports that the nausea no is worse has she is presenting these since early in the morning and she has to lay down in her bed during the day when the nausea is severe.  Husband states she eats a small amount of food, mostly at lunch and he believes she has lost some weight. States she has had intermittent pain in her mid abdomen for a few months.  The patient denies having any nausea, vomiting, fever, chills, hematochezia, melena, hematemesis, abdominal distention, abdominal pain, diarrhea, jaundice, pruritus. Weight is the same as in previous visit.  Previous studies:  Gastric emptying study March 16, 2022 which was within normal limits.  She had an 8 AM cortisol test performed which was normal x 2. CT scan of the  abdomen and pelvis with IV contrast on 2023 showing showed pneumobilia secondary to previous sphincterotomy and atrophy pancreatitis, evidence of prior colon surgery. Normal cortisol levels in the past.  Normal head CT.  Last EGD: 03/2020 - Normal esophagus. - Z-line regular, 37 cm from the incisors. - 3 cm hiatal hernia. - Gastritis. Biopsied. - Normal duodenal bulb and second portion of the duodenum. Biopsied.   Last Colonoscopy: 03/2020 - Diverticulosis in the proximal sigmoid colon. - Patent end-to-end colo-colonic anastomosis. - External hemorrhoids.   Path: A. STOMACH, BIOPSY:  - Mild chronic gastritis and reactive gastropathy.  - Intestinal metaplasia is present focally in antrum, negative for  dysplasia.  - Warthin-Starry stain is negative for Helicobacter pylori.   B. DUODENUM, BIOPSY:  - Duodenal mucosa with no significant pathologic findings.  - Negative for increased intraepithelial lymphocytes and villous  architectural changes.   Past Medical History: Past Medical History:  Diagnosis Date   Bipolar 2 disorder (HCC)    Chronic insomnia    Congestive heart failure (CHF) (HCC) 12/2019   Diverticulosis of colon    Dyspnea    Elevated LFTs    Fatty liver disease, nonalcoholic    GERD (gastroesophageal reflux disease)    H/O first degree atrioventricular block    Hypercholesteremia    Hypertension    Major depressive disorder    Migraine    Osteopenia    Pancreatitis    Pneumonia    Sleep apnea    Stop Bang score of 4. Pt said she had sleep apnea at one time, but she has had another sleep study since then and they said she does not have it anymore.  Vitamin D deficiency     Past Surgical History: Past Surgical History:  Procedure Laterality Date   BILIARY STENT PLACEMENT N/A 07/18/2013   Procedure: BILIARY STENT PLACEMENT;  Surgeon: Lindsay Hippo, MD;  Location: [facility];  Service: Endoscopy;  Laterality: N/A;   BIOPSY  03/24/2020   Procedure: BIOPSY;   Surgeon: Lindsay Hippo, MD;  [facility];  Service: Endoscopy;;   Cataract Surgery Bilateral 08-2012   CHOLECYSTECTOMY     COLONOSCOPY  2003   Diverticulitis h/o polyps -due next 2015 -gastroscopy 2003   COLONOSCOPY WITH PROPOFOL N/A 03/24/2020   Procedure: COLONOSCOPY WITH PROPOFOL;  Surgeon: Lindsay Hippo, MD;  [facility]: [facility];  Service: Endoscopy;  Laterality: N/A;  9:00, pt tested + 1/25 <90 days   ERCP     ERCP N/A 07/18/2013   Procedure: ENDOSCOPIC RETROGRADE CHOLANGIOPANCREATOGRAPHY (ERCP) COMMON BILE DUCT BRUSHING;  Surgeon: Lindsay Hippo, MD;  Location: [facility];  Service: Endoscopy;  Laterality: N/A;   ERCP N/A 11/07/2013   Procedure: ENDOSCOPIC RETROGRADE CHOLANGIOPANCREATOGRAPHY (ERCP);  Surgeon: Lindsay Hippo, MD;  Location: [facility];  Service: Endoscopy;  Laterality: N/A;   ESOPHAGEAL DILATION N/A 08/23/2016   Procedure: ESOPHAGEAL DILATION;  Surgeon: Lindsay Hippo, MD;  [facility];  Service: Endoscopy;  Laterality: N/A;   ESOPHAGOGASTRODUODENOSCOPY N/A 08/23/2016   Procedure: ESOPHAGOGASTRODUODENOSCOPY (EGD);  Surgeon: Lindsay Hippo, MD;  [facility];  Service: Endoscopy;  Laterality: N/A;  245-moved up to 100 per [name]   ESOPHAGOGASTRODUODENOSCOPY (EGD) WITH PROPOFOL N/A 03/24/2020   Procedure: ESOPHAGOGASTRODUODENOSCOPY (EGD) WITH PROPOFOL;  Surgeon: Lindsay Hippo, MD;  [facility];  Service: Endoscopy;  Laterality: N/A;   EYE SURGERY Bilateral    cataract removal   NECK SURGERY     Stenosis C6-C7   PARTIAL COLECTOMY  2018   SIGMOIDOSCOPY  2007   no polyps   SPHINCTEROTOMY N/A 07/18/2013   Procedure: SPHINCTEROTOMY;  Surgeon: Lindsay Hippo, MD;  Location: [facility];  Service: Endoscopy;  Laterality: N/A;   SPHINCTEROTOMY  11/07/2013   Procedure: EXTENSION OF SPHINCTEROTOMY;  Surgeon: Lindsay Hippo, MD;  Location: [facility];  Service: Endoscopy;;   STENT REMOVAL N/A 11/07/2013   Procedure: STENT REMOVAL;   Surgeon: Lindsay Hippo, MD;  Location: [facility];  Service: Endoscopy;  Laterality: N/A;    Family History: Family History  Problem Relation Age of Onset   Diabetes Mother    Coronary artery disease Father    Gout Father    Breast cancer Sister    Pancreatic cancer Brother    Alcohol abuse Brother    Healthy Brother    Lung cancer Sister    Healthy Sister    Depression Sister    Hyperlipidemia Daughter    Hyperlipidemia Son    Bipolar disorder Son    Healthy Sister    Healthy Sister    Seizures Other     Social History: Social History   Tobacco Use  Smoking Status Never   Passive exposure: Never  Smokeless Tobacco Never   Social History   Substance and Sexual Activity  Alcohol Use No   Alcohol/week: 0.0 standard drinks of alcohol   Social History   Substance and Sexual Activity  Drug Use No    Allergies: Allergies  Allergen Reactions   Codeine Nausea And Vomiting   Ciprofloxacin Nausea And Vomiting   Flagyl [Metronidazole] Nausea And Vomiting   Oxycodone Nausea And Vomiting  Medications: Current Outpatient Medications  Medication Sig Dispense Refill   acetaminophen (TYLENOL) 500 MG tablet Take 500-1,000 mg by mouth every 6 (six) hours as needed (pain).     ARIPiprazole (ABILIFY) 10 MG tablet Take 1 tablet (10 mg total) by mouth at bedtime. 90 tablet 2   carvedilol (COREG) 25 MG tablet Take 25 mg by mouth 2 (two) times daily with a meal. LUNCH & EVENING     Cholecalciferol (VITAMIN D3) 10 MCG (400 UNIT) CAPS Take 400 Units by mouth daily.     esomeprazole (NEXIUM) 40 MG capsule TAKE 1 CAPSULE BY MOUTH ONCE DAILY BEFORE BREAKFAST 90 capsule 3   ferrous sulfate (FERROUSUL) 325 (65 FE) MG tablet Take 1 tablet (325 mg total) by mouth 2 (two) times daily. (Patient taking differently: Take 325 mg by mouth daily with breakfast.)  0   hydrALAZINE (APRESOLINE) 100 MG tablet Take 100 mg by mouth 2 (two) times daily.     lamoTRIgine (LAMICTAL) 200 MG tablet Take 1  tablet (200 mg total) by mouth daily. 90 tablet 2   LORazepam (ATIVAN) 0.5 MG tablet Take 1 tablet (0.5 mg total) by mouth 2 (two) times daily as needed for anxiety. 60 tablet 5   losartan (COZAAR) 100 MG tablet Take 100 mg by mouth daily.     meloxicam (MOBIC) 15 MG tablet Take 1 tablet (15 mg total) by mouth daily.     metoCLOPramide (REGLAN) 5 MG tablet Take 1 tablet (5 mg total) by mouth every 8 (eight) hours as needed for nausea. 90 tablet 3   Omega-3 Fatty Acids (FISH OIL PO) Take 1 capsule by mouth in the morning and at bedtime.     OVER THE COUNTER MEDICATION Vit D 400 units capsule daily     polyethylene glycol powder (GLYCOLAX/MIRALAX) 17 GM/SCOOP powder Take 17 grams daily prn for Constipation. 1581 g 5   potassium chloride (MICRO-K) 10 MEQ CR capsule TAKE 2 CAPSULES BY MOUTH DAILY AS NEEDED ( TAKE ON DAYS WHEN YOU TAKE TORSEMIDE) 14 capsule 0   simvastatin (ZOCOR) 20 MG tablet Take 20 mg by mouth daily with lunch.     torsemide (DEMADEX) 20 MG tablet Take 20 mg by mouth every other day.     vitamin B-12 (CYANOCOBALAMIN) 1000 MCG tablet Take 1,000 mcg by mouth every evening.     zolpidem (AMBIEN) 10 MG tablet TAKE 1 TABLET BY MOUTH AT BEDTIME AS NEEDED FOR SLEEP 30 tablet 4   hyoscyamine (LEVSIN SL) 0.125 MG SL tablet PLACE 1-2 TABLETS UNDER THE TONGUE 2 (TWO) TIMES DAILY AS NEEDED. (Patient not taking: Reported on 11/02/2022) 120 tablet 2   temazepam (RESTORIL) 15 MG capsule Take 1 capsule (15 mg total) by mouth at bedtime as needed for sleep. 30 capsule 2   No current facility-administered medications for this visit.    Review of Systems: GENERAL: negative for malaise, night sweats HEENT: No changes in hearing or vision, no nose bleeds or other nasal problems. NECK: Negative for lumps, goiter, pain and significant neck swelling RESPIRATORY: Negative for cough, wheezing CARDIOVASCULAR: Negative for chest pain, leg swelling, palpitations, orthopnea GI: SEE HPI MUSCULOSKELETAL:  Negative for joint pain or swelling, back pain, and muscle pain. SKIN: Negative for lesions, rash PSYCH: Negative for sleep disturbance, mood disorder and recent psychosocial stressors. HEMATOLOGY Negative for prolonged bleeding, bruising easily, and swollen nodes. ENDOCRINE: Negative for cold or heat intolerance, polyuria, polydipsia and goiter. NEURO: negative for tremor, gait imbalance, syncope and seizures. The remainder  of the review of systems is noncontributory.   Physical Exam: BP (!) 180/51 (BP Location: Right Arm, Patient Position: Sitting, Cuff Size: Large)   Pulse 71   Temp (!) 97.1 F (36.2 C) (Temporal)   Ht 5\' 4"  (1.626 m)   Wt 180 lb 6.4 oz (81.8 kg)   BMI 30.97 kg/m  GENERAL: The patient is AO x3, in no acute distress. HEENT: Head is normocephalic and atraumatic. EOMI are intact. Mouth is well hydrated and without lesions. NECK: Supple. No masses LUNGS: Clear to auscultation. No presence of rhonchi/wheezing/rales. Adequate chest expansion HEART: RRR, normal s1 and s2. ABDOMEN: mildly tender upon palpation of mid abdomen, no guarding, no peritoneal signs, and nondistended. BS +. No masses. EXTREMITIES: Without any cyanosis, clubbing, rash, lesions or edema. NEUROLOGIC: AOx3, no focal motor deficit. SKIN: no jaundice, no rashes  Imaging/Labs: as above  I personally reviewed and interpreted the available labs, imaging and endoscopic files.  Impression and Plan: Darlette Dubow is a 82 y.o. 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  The patient has presented chronic nausea for multiple years which has been worsening for the last 2 years.  She has had an  extensive investigation as delineated in this note.  So far, no abnormalities have been found to explain her symptoms.  The patient is very frustrated with this but she understands that multiple etiologies have been ruled out.  She is not improving at all with the use of Reglan and has failed the use of Zofran and Compazine.  She will proceed with a repeat CT of the abdomen pelvis with IV contrast.  I discussed with the patient and her husband the possibility of adding a TCA as part of her management, but I advised her that this will need to be discussed with Dr. Tenny Craw before given the use of Lamictal and Abilify.  - Schedule CT abdomen/pelvis with IV contrast - Stop metoclopramide, if worsening nausea please restart medication  All questions were answered.      Lindsay Blazing, MD Gastroenterology and Hepatology [facility] Gastroenterology

## 2022-11-14 ENCOUNTER — Other Ambulatory Visit (HOSPITAL_COMMUNITY): Payer: Self-pay | Admitting: Psychiatry

## 2022-11-14 ENCOUNTER — Telehealth (HOSPITAL_COMMUNITY): Payer: Self-pay | Admitting: *Deleted

## 2022-11-14 MED ORDER — ZOLPIDEM TARTRATE 10 MG PO TABS: 10.0000 mg | ORAL_TABLET | Freq: Every evening | ORAL | 4 refills | Status: DC | PRN

## 2022-11-14 NOTE — Telephone Encounter (Signed)
Per pt her sleep medication is not working. Would like to go back to the old sleep medication. Per pt she is only getting about 2 hours of sleep. The old medication name is Zolpidem. The medication she would like to stop is Temazepam. Per pt her pharmacy is [facility] in [place].

## 2022-11-14 NOTE — Telephone Encounter (Signed)
sent 

## 2022-12-27 ENCOUNTER — Telehealth (INDEPENDENT_AMBULATORY_CARE_PROVIDER_SITE_OTHER): Payer: Self-pay | Admitting: Gastroenterology

## 2022-12-27 NOTE — Telephone Encounter (Signed)
Hi Lindsay Palmer, Please let the patient know that the CT scan did not show any acute abnormalities in her abdomen that could explain the nausea and vomiting she has presented. I will reach her psychiatrist (Dr. Tenny Craw) to discuss if it is possible to start her on a low-dose TCA and concomitant use with Lamictal and Abilify.  I will let her know if this is possible.  Thanks

## 2023-01-01 ENCOUNTER — Other Ambulatory Visit (INDEPENDENT_AMBULATORY_CARE_PROVIDER_SITE_OTHER): Payer: Self-pay | Admitting: Gastroenterology

## 2023-01-01 DIAGNOSIS — R112 Nausea with vomiting, unspecified: Secondary | ICD-10-CM

## 2023-01-01 MED ORDER — AMITRIPTYLINE HCL 10 MG PO TABS
10.0000 mg | ORAL_TABLET | Freq: Every day | ORAL | 3 refills | Status: DC
Start: 2023-01-01 — End: 2023-09-03

## 2023-01-01 NOTE — Telephone Encounter (Signed)
Discussed with patient per Dr. Levon Hedger -  the CT scan did not show any acute abnormalities in her abdomen that could explain the nausea and vomiting she has presented. I will reach her psychiatrist (Dr. Tenny Craw) to discuss if it is possible to start her on a low-dose TCA and concomitant use with Lamictal and Abilify.  I will let her know if this is possible.    Patient verbalized understanding.

## 2023-01-01 NOTE — Telephone Encounter (Signed)
I discussed the patient with Dr. Tenny Craw, she stated that he will be okay to start the patient on low-dose TCA.  I discussed with the patient we will be starting her on Elavil 10 milligrams at bedtime.  I discussed with her that I do not consider it will be adequate to uptitrate the dose but we will try this medication to relieve her symptoms.  Patient understood and agreed.

## 2023-01-08 ENCOUNTER — Telehealth (HOSPITAL_COMMUNITY): Payer: Self-pay | Admitting: *Deleted

## 2023-01-08 NOTE — Telephone Encounter (Signed)
Patient would like for provider to please call her to ask her about this medication. Per pt she needs provider medical advise about this medication and if it'll be fine to take with all of her other medication. Medication name is Amitriptyline.

## 2023-01-08 NOTE — Telephone Encounter (Signed)
Informed patient with what provider stated and she agreed and verbalized understanding.  

## 2023-02-26 ENCOUNTER — Encounter (INDEPENDENT_AMBULATORY_CARE_PROVIDER_SITE_OTHER): Payer: Self-pay | Admitting: Gastroenterology

## 2023-02-26 ENCOUNTER — Ambulatory Visit (INDEPENDENT_AMBULATORY_CARE_PROVIDER_SITE_OTHER): Payer: Medicare Other | Admitting: Gastroenterology

## 2023-02-26 VITALS — BP 170/78 | HR 66 | Temp 98.0°F | Ht 64.0 in | Wt 175.7 lb

## 2023-02-26 DIAGNOSIS — R110 Nausea: Secondary | ICD-10-CM

## 2023-02-26 NOTE — Patient Instructions (Signed)
Continue with Elavil 10 mg at bedtime

## 2023-02-26 NOTE — Progress Notes (Unsigned)
Lindsay Palmer, M.D. Gastroenterology & Hepatology [facility] [address] [place], [place] [address]  Primary Care Physician: Garald Braver [address] [place] [address]  I will communicate my assessment and recommendations to the referring MD via EMR.  Problems: Chronic postprandial  nausea History of post ERCP pancreatitis H/o perforated diverticulitis   History of Present Illness: Lindsay Palmer is a 83 y.o. 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  The patient was last seen on 11/02/2022. At that time, the patient was ordered to have a CT of the abdomen pelvis with IV contrast, which was within normal limits.  I discussed the case with Dr. Tenny Craw (psychiatrist), she stated that it would be okay to start the patient on low-dose TCA.  On 12/27/2022, I discussed with the patient we will be starting her on Elavil 10 milligrams at bedtime.     Patient states that after she started Elavil, she has been feeling better, vomiting possibly 2-3 times in last 2 months. Has some nausea but it is much better compared to prior.  The patient denies having any fever, chills, hematochezia, melena, hematemesis, abdominal distention, abdominal pain, diarrhea, jaundice, pruritus or recent weight loss.  Previous studies:  Gastric emptying study Mar 12, 2022 which was within normal limits.  She had an 8 AM cortisol test performed which was normal x 2. CT scan of the abdomen and pelvis with IV contrast on 2023 showing showed pneumobilia secondary to previous sphincterotomy and atrophy pancreatitis, evidence of prior colon surgery. Normal  cortisol levels in the past.  Normal head CT.  Last EGD: 03/2020 - Normal esophagus. - Z-line regular, 37 cm from the incisors. - 3 cm hiatal hernia. - Gastritis. Biopsied. - Normal duodenal bulb and second portion of the duodenum. Biopsied.   Last Colonoscopy: 03/2020 - Diverticulosis in the proximal sigmoid colon. - Patent end-to-end colo-colonic anastomosis. - External hemorrhoids.   Path: A. STOMACH, BIOPSY:  - Mild chronic gastritis and reactive gastropathy.  - Intestinal metaplasia is present focally in antrum, negative for  dysplasia.  - Warthin-Starry stain is negative for Helicobacter pylori.   B. DUODENUM, BIOPSY:  - Duodenal mucosa with no significant pathologic findings.  - Negative for increased intraepithelial lymphocytes and villous  architectural changes.   Past Medical History: Past Medical History:  Diagnosis Date   Bipolar 2 disorder (HCC)    Chronic insomnia    Congestive heart failure (CHF) (HCC) 12/2019   Diverticulosis of colon    Dyspnea    Elevated LFTs    Fatty liver disease, nonalcoholic    GERD (gastroesophageal reflux disease)    H/O first degree atrioventricular block    Hypercholesteremia    Hypertension    Major depressive disorder    Migraine    Osteopenia    Pancreatitis    Pneumonia    Sleep apnea    Stop Bang score of 4. Pt said she had sleep apnea at one time, but she has had another sleep study since then and they said she does not have it anymore.   Vitamin D deficiency     Past Surgical History: Past Surgical History:  Procedure Laterality Date   BILIARY STENT PLACEMENT N/A 07/18/2013   Procedure: BILIARY STENT PLACEMENT;  Surgeon:  Malissa Hippo, MD;  Location: [facility];  Service: Endoscopy;  Laterality: N/A;   BIOPSY  03/24/2020   Procedure: BIOPSY;  Surgeon: Malissa Hippo, MD;  [facility];  Service: Endoscopy;;   Cataract Surgery Bilateral 08-2012   CHOLECYSTECTOMY     COLONOSCOPY  2003   Diverticulitis  h/o polyps -due next 2015 -gastroscopy 2003   COLONOSCOPY WITH PROPOFOL N/A 03/24/2020   Procedure: COLONOSCOPY WITH PROPOFOL;  Surgeon: Malissa Hippo, MD;  [facility];  Service: Endoscopy;  Laterality: N/A;  9:00, pt tested + 1/25 <90 days   ERCP     ERCP N/A 07/18/2013   Procedure: ENDOSCOPIC RETROGRADE CHOLANGIOPANCREATOGRAPHY (ERCP) COMMON BILE DUCT BRUSHING;  Surgeon: Malissa Hippo, MD;  Location: [facility];  Service: Endoscopy;  Laterality: N/A;   ERCP N/A 11/07/2013   Procedure: ENDOSCOPIC RETROGRADE CHOLANGIOPANCREATOGRAPHY (ERCP);  Surgeon: Malissa Hippo, MD;  Location: [facility];  Service: Endoscopy;  Laterality: N/A;   ESOPHAGEAL DILATION N/A 08/23/2016   Procedure: ESOPHAGEAL DILATION;  Surgeon: Malissa Hippo, MD;  [facility];  Service: Endoscopy;  Laterality: N/A;   ESOPHAGOGASTRODUODENOSCOPY N/A 08/23/2016   Procedure: ESOPHAGOGASTRODUODENOSCOPY (EGD);  Surgeon: Malissa Hippo, MD;  [facility];  Service: Endoscopy;  Laterality: N/A;  245-moved up to 100 per [name]   ESOPHAGOGASTRODUODENOSCOPY (EGD) WITH PROPOFOL N/A 03/24/2020   Procedure: ESOPHAGOGASTRODUODENOSCOPY (EGD) WITH PROPOFOL;  Surgeon: Malissa Hippo, MD;  [facility];  Service: Endoscopy;  Laterality: N/A;   EYE SURGERY Bilateral    cataract removal   NECK SURGERY     Stenosis C6-C7   PARTIAL COLECTOMY  2018   SIGMOIDOSCOPY  2007   no polyps   SPHINCTEROTOMY N/A 07/18/2013   Procedure: SPHINCTEROTOMY;  Surgeon: Malissa Hippo, MD;  Location: [facility];  Service: Endoscopy;  Laterality: N/A;   SPHINCTEROTOMY  11/07/2013   Procedure: EXTENSION OF SPHINCTEROTOMY;  Surgeon: Malissa Hippo, MD;  Location: [facility];  Service: Endoscopy;;   STENT REMOVAL N/A 11/07/2013   Procedure: STENT REMOVAL;  Surgeon: Malissa Hippo, MD;  Location: [facility];  Service: Endoscopy;  Laterality: N/A;    Family History: Family History  Problem Relation Age of Onset   Diabetes Mother     Coronary artery disease Father    Gout Father    Breast cancer Sister    Pancreatic cancer Brother    Alcohol abuse Brother    Healthy Brother    Lung cancer Sister    Healthy Sister    Depression Sister    Hyperlipidemia Daughter    Hyperlipidemia Son    Bipolar disorder Son    Healthy Sister    Healthy Sister    Seizures Other     Social History: Social History   Tobacco Use  Smoking Status Never   Passive exposure: Never  Smokeless Tobacco Never   Social History   Substance and Sexual Activity  Alcohol Use No   Alcohol/week: 0.0 standard drinks of alcohol   Social History   Substance and Sexual Activity  Drug Use No    Allergies: Allergies  Allergen Reactions   Codeine Nausea And Vomiting   Ciprofloxacin Nausea And Vomiting   Flagyl [Metronidazole] Nausea And Vomiting   Oxycodone Nausea And Vomiting    Medications: Current Outpatient Medications  Medication Sig Dispense Refill   acetaminophen (TYLENOL) 500 MG tablet Take 500-1,000 mg by mouth every 6 (six) hours as needed (pain).  amitriptyline (ELAVIL) 10 MG tablet Take 1 tablet (10 mg total) by mouth at bedtime. 90 tablet 3   ARIPiprazole (ABILIFY) 10 MG tablet Take 1 tablet (10 mg total) by mouth at bedtime. 90 tablet 2   carvedilol (COREG) 25 MG tablet Take 25 mg by mouth 2 (two) times daily with a meal. LUNCH & EVENING     Cholecalciferol (VITAMIN D3) 10 MCG (400 UNIT) CAPS Take 400 Units by mouth daily.     esomeprazole (NEXIUM) 40 MG capsule TAKE 1 CAPSULE BY MOUTH ONCE DAILY BEFORE BREAKFAST 90 capsule 3   ferrous sulfate (FERROUSUL) 325 (65 FE) MG tablet Take 1 tablet (325 mg total) by mouth 2 (two) times daily. (Patient taking differently: Take 325 mg by mouth daily with breakfast.)  0   hydrALAZINE (APRESOLINE) 100 MG tablet Take 100 mg by mouth 3 (three) times daily.     lamoTRIgine (LAMICTAL) 200 MG tablet Take 1 tablet (200 mg total) by mouth daily. 90 tablet 2   losartan (COZAAR) 100 MG  tablet Take 100 mg by mouth daily.     meloxicam (MOBIC) 15 MG tablet Take 1 tablet (15 mg total) by mouth daily.     metoCLOPramide (REGLAN) 5 MG tablet Take 1 tablet by mouth every 8 (eight) hours as needed.     Omega-3 Fatty Acids (FISH OIL PO) Take 1 capsule by mouth in the morning and at bedtime.     OVER THE COUNTER MEDICATION Vit D 400 units capsule daily     potassium chloride (MICRO-K) 10 MEQ CR capsule TAKE 2 CAPSULES BY MOUTH DAILY AS NEEDED ( TAKE ON DAYS WHEN YOU TAKE TORSEMIDE) 14 capsule 0   simvastatin (ZOCOR) 20 MG tablet Take 20 mg by mouth daily with lunch.     temazepam (RESTORIL) 15 MG capsule Take 1 capsule (15 mg total) by mouth at bedtime as needed for sleep. 30 capsule 2   torsemide (DEMADEX) 20 MG tablet Take 20 mg by mouth every other day.     vitamin B-12 (CYANOCOBALAMIN) 1000 MCG tablet Take 1,000 mcg by mouth every evening.     zolpidem (AMBIEN) 10 MG tablet Take 1 tablet (10 mg total) by mouth at bedtime as needed. for sleep 30 tablet 4   No current facility-administered medications for this visit.    Review of Systems: GENERAL: negative for malaise, night sweats HEENT: No changes in hearing or vision, no nose bleeds or other nasal problems. NECK: Negative for lumps, goiter, pain and significant neck swelling RESPIRATORY: Negative for cough, wheezing CARDIOVASCULAR: Negative for chest pain, leg swelling, palpitations, orthopnea GI: SEE HPI MUSCULOSKELETAL: Negative for joint pain or swelling, back pain, and muscle pain. SKIN: Negative for lesions, rash PSYCH: Negative for sleep disturbance, mood disorder and recent psychosocial stressors. HEMATOLOGY Negative for prolonged bleeding, bruising easily, and swollen nodes. ENDOCRINE: Negative for cold or heat intolerance, polyuria, polydipsia and goiter. NEURO: negative for tremor, gait imbalance, syncope and seizures. The remainder of the review of systems is noncontributory.   Physical Exam: BP (!) 170/78  (BP Location: Left Arm, Patient Position: Sitting, Cuff Size: Large)   Pulse 66   Temp 98 F (36.7 C) (Temporal)   Ht 5\' 4"  (1.626 m)   Wt 175 lb 11.2 oz (79.7 kg)   BMI 30.16 kg/m  GENERAL: The patient is AO x3, in no acute distress. HEENT: Head is normocephalic and atraumatic. EOMI are intact. Mouth is well hydrated and without lesions. NECK: Supple. No masses LUNGS: Clear  to auscultation. No presence of rhonchi/wheezing/rales. Adequate chest expansion HEART: RRR, normal s1 and s2. ABDOMEN: Soft, nontender, no guarding, no peritoneal signs, and nondistended. BS +. No masses. EXTREMITIES: Without any cyanosis, clubbing, rash, lesions or edema. NEUROLOGIC: AOx3, no focal motor deficit. SKIN: no jaundice, no rashes  Imaging/Labs: as above  I personally reviewed and interpreted the available labs, imaging and endoscopic files.  Impression and Plan: Lindsay Palmer is a 83 y.o. female coming for follow up of ***  -Continue with Elavil 10 mg at bedtime  All questions were answered.      Lindsay Blazing, MD Gastroenterology and Hepatology [facility]astroenterology

## 2023-03-02 ENCOUNTER — Ambulatory Visit (INDEPENDENT_AMBULATORY_CARE_PROVIDER_SITE_OTHER): Payer: Medicare Other | Admitting: Gastroenterology

## 2023-03-07 ENCOUNTER — Telehealth (HOSPITAL_COMMUNITY): Payer: Medicare Other | Admitting: Psychiatry

## 2023-03-08 ENCOUNTER — Other Ambulatory Visit (INDEPENDENT_AMBULATORY_CARE_PROVIDER_SITE_OTHER): Payer: Self-pay | Admitting: Gastroenterology

## 2023-03-09 ENCOUNTER — Telehealth (INDEPENDENT_AMBULATORY_CARE_PROVIDER_SITE_OTHER): Payer: Medicare Other | Admitting: Psychiatry

## 2023-03-09 ENCOUNTER — Encounter (HOSPITAL_COMMUNITY): Payer: Self-pay | Admitting: Psychiatry

## 2023-03-09 DIAGNOSIS — F3130 Bipolar disorder, current episode depressed, mild or moderate severity, unspecified: Secondary | ICD-10-CM | POA: Diagnosis not present

## 2023-03-09 MED ORDER — ZOLPIDEM TARTRATE 10 MG PO TABS: 10.0000 mg | ORAL_TABLET | Freq: Every evening | ORAL | 5 refills | Status: DC | PRN

## 2023-03-09 MED ORDER — LORAZEPAM 0.5 MG PO TABS: 0.5000 mg | ORAL_TABLET | Freq: Two times a day (BID) | ORAL | 5 refills | Status: DC | PRN

## 2023-03-09 MED ORDER — ARIPIPRAZOLE 10 MG PO TABS: 10.0000 mg | ORAL_TABLET | Freq: Every day | ORAL | 2 refills | Status: DC

## 2023-03-09 MED ORDER — LAMOTRIGINE 200 MG PO TABS: 200.0000 mg | ORAL_TABLET | Freq: Every day | ORAL | 2 refills | Status: DC

## 2023-03-09 NOTE — Progress Notes (Signed)
 Virtual Visit via Telephone Note  I connected with Lindsay Palmer on 03/09/23 at  9:00 AM EST by telephone and verified that I am speaking with the correct person using two identifiers.  Location: Patient: home Provider: office   I discussed the limitations, risks, security and privacy concerns of performing an evaluation and management service by telephone and the availability of in person appointments. I also discussed with the patient that there may be a patient responsible charge related to this service. The patient expressed understanding and agreed to proceed.      I discussed the assessment and treatment plan with the patient. The patient was provided an opportunity to ask questions and all were answered. The patient agreed with the plan and demonstrated an understanding of the instructions.   The patient was advised to call back or seek an in-person evaluation if the symptoms worsen or if the condition fails to improve as anticipated.  I provided 20 minutes of non-face-to-face time during this encounter.   [name], MD  [facility] MD/PA/NP OP Progress Note  03/09/2023 9:31 AM Lindsay Palmer  MRN:  [id number]  Chief Complaint:  Chief Complaint  Patient presents with   Manic Behavior   Anxiety   Follow-up   HPI: This patient is an 83 year old married white female who lives with her husband in [place] .  She is retired from [employer]  She has 1 son, 1 daughter and 3 grandchildren.   The patient returns for follow-up after 6 months regarding her bipolar disorder anxiety and insomnia.  Her gastroenterologist has added amitriptyline  10 mg at bedtime which is really helped with her abdominal symptoms.  She has had less nausea and cramping and she is eating much better.  After trying numerous medicines she decided to go back to Ambien  for sleep.  She still has good and bad nights but the Ambien  helps more than anything else.  She states that her mood has been good and  she denies depression and manic symptoms both.  She has not had racing thoughts or impulsive behaviors.  She feels that her mood is stable.  She denies any thoughts of suicide or self-harm Visit Diagnosis:    ICD-10-CM   1. Bipolar I disorder, most recent episode depressed (HCC)  F31.30       Past Psychiatric History: Long-term outpatient treatment for bipolar disorder  Past Medical History:  Past Medical History:  Diagnosis Date   Bipolar 2 disorder (HCC)    Chronic insomnia    Congestive heart failure (CHF) (HCC) 12/2019   Diverticulosis of colon    Dyspnea    Elevated LFTs    Fatty liver disease, nonalcoholic    GERD (gastroesophageal reflux disease)    H/O first degree atrioventricular block    Hypercholesteremia    Hypertension    Major depressive disorder    Migraine    Osteopenia    Pancreatitis    Pneumonia    Sleep apnea    Stop Bang score of 4. Pt said she had sleep apnea at one time, but she has had another sleep study since then and they said she does not have it anymore.   Vitamin D deficiency     Past Surgical History:  Procedure Laterality Date   BILIARY STENT PLACEMENT N/A 07/18/2013   Procedure: BILIARY STENT PLACEMENT;  Surgeon: Claudis RAYMOND Rivet, MD;  Location: [facility];  Service: Endoscopy;  Laterality: N/A;   BIOPSY  03/24/2020   Procedure: BIOPSY;  Surgeon:  Golda Claudis PENNER, MD;  [facility];  Service: Endoscopy;;   Cataract Surgery Bilateral 08-2012   CHOLECYSTECTOMY     COLONOSCOPY  2003   Diverticulitis h/o polyps -due next 2015 -gastroscopy 2003   COLONOSCOPY WITH PROPOFOL  N/A 03/24/2020   Procedure: COLONOSCOPY WITH PROPOFOL ;  Surgeon: Golda Claudis PENNER, MD;  [facility];  Service: Endoscopy;  Laterality: N/A;  9:00, pt tested + 1/25 <90 days   ERCP     ERCP N/A 07/18/2013   Procedure: ENDOSCOPIC RETROGRADE CHOLANGIOPANCREATOGRAPHY (ERCP) COMMON BILE DUCT BRUSHING;  Surgeon: Claudis PENNER Golda, MD;  Location: [facility];  Service:  Endoscopy;  Laterality: N/A;   ERCP N/A 11/07/2013   Procedure: ENDOSCOPIC RETROGRADE CHOLANGIOPANCREATOGRAPHY (ERCP);  Surgeon: Claudis PENNER Golda, MD;  Location: [facility];  Service: Endoscopy;  Laterality: N/A;   ESOPHAGEAL DILATION N/A 08/23/2016   Procedure: ESOPHAGEAL DILATION;  Surgeon: Golda Claudis PENNER, MD;  [facility];  Service: Endoscopy;  Laterality: N/A;   ESOPHAGOGASTRODUODENOSCOPY N/A 08/23/2016   Procedure: ESOPHAGOGASTRODUODENOSCOPY (EGD);  Surgeon: Golda Claudis PENNER, MD;  [facility];  Service: Endoscopy;  Laterality: N/A;  245-moved up to 100 per Jenkins   ESOPHAGOGASTRODUODENOSCOPY (EGD) WITH PROPOFOL  N/A 03/24/2020   Procedure: ESOPHAGOGASTRODUODENOSCOPY (EGD) WITH PROPOFOL ;  Surgeon: Golda Claudis PENNER, MD;  [facility];  Service: Endoscopy;  Laterality: N/A;   EYE SURGERY Bilateral    cataract removal   NECK SURGERY     Stenosis C6-C7   PARTIAL COLECTOMY  2018   SIGMOIDOSCOPY  2007   no polyps   SPHINCTEROTOMY N/A 07/18/2013   Procedure: SPHINCTEROTOMY;  Surgeon: Claudis PENNER Golda, MD;  [facility]: [facility];  Service: Endoscopy;  Laterality: N/A;   SPHINCTEROTOMY  11/07/2013   Procedure: EXTENSION OF SPHINCTEROTOMY;  Surgeon: Claudis PENNER Golda, MD;  Location: [facility];  Service: Endoscopy;;   STENT REMOVAL N/A 11/07/2013   Procedure: STENT REMOVAL;  Surgeon: Claudis PENNER Golda, MD;  Location: [facility];  Service: Endoscopy;  Laterality: N/A;    Family Psychiatric History: See below  Family History:  Family History  Problem Relation Age of Onset   Diabetes Mother    Coronary artery disease Father    Gout Father    Breast cancer Sister    Pancreatic cancer Brother    Alcohol abuse Brother    Healthy Brother    Lung cancer Sister    Healthy Sister    Depression Sister    Hyperlipidemia Daughter    Hyperlipidemia Son    Bipolar disorder Son    Healthy Sister    Healthy Sister    Seizures Other     Social History:  Social History   Socioeconomic  History   Marital status: Married    Spouse name: Not on file   Number of children: Not on file   Years of education: Not on file   Highest education level: Not on file  Occupational History   Not on file  Tobacco Use   Smoking status: Never    Passive exposure: Never   Smokeless tobacco: Never  Vaping Use   Vaping status: Never Used  Substance and Sexual Activity   Alcohol use: No    Alcohol/week: 0.0 standard drinks of alcohol   Drug use: No   Sexual activity: Not on file  Other Topics Concern   Not on file  Social History Narrative   Not on file   Social Drivers of Health   Financial Resource Strain:  Not on file  Food Insecurity: Not on file  Transportation Needs: Not on file  Physical Activity: Not on file  Stress: Not on file  Social Connections: Not on file    Allergies:  Allergies  Allergen Reactions   Codeine Nausea And Vomiting   Ciprofloxacin  Nausea And Vomiting   Flagyl  [Metronidazole ] Nausea And Vomiting   Oxycodone  Nausea And Vomiting    Metabolic Disorder Labs: No results found for: HGBA1C, MPG No results found for: PROLACTIN Lab Results  Component Value Date   TRIG 113 07/28/2013   Lab Results  Component Value Date   TSH 1.38 02/08/2021   TSH 1.213 09/14/2014    Therapeutic Level Labs: No results found for: LITHIUM No results found for: VALPROATE No results found for: CBMZ  Current Medications: Current Outpatient Medications  Medication Sig Dispense Refill   acetaminophen  (TYLENOL ) 500 MG tablet Take 500-1,000 mg by mouth every 6 (six) hours as needed (pain).     amitriptyline  (ELAVIL ) 10 MG tablet Take 1 tablet (10 mg total) by mouth at bedtime. 90 tablet 3   ARIPiprazole  (ABILIFY ) 10 MG tablet Take 1 tablet (10 mg total) by mouth at bedtime. 90 tablet 2   carvedilol (COREG) 25 MG tablet Take 25 mg by mouth 2 (two) times daily with a meal. LUNCH & EVENING     Cholecalciferol  (VITAMIN D3) 10 MCG (400 UNIT) CAPS Take 400  Units by mouth daily.     esomeprazole  (NEXIUM ) 40 MG capsule TAKE 1 CAPSULE BY MOUTH ONCE DAILY BEFORE BREAKFAST 90 capsule 3   ferrous sulfate  (FERROUSUL) 325 (65 FE) MG tablet Take 1 tablet (325 mg total) by mouth 2 (two) times daily. (Patient taking differently: Take 325 mg by mouth daily with breakfast.)  0   hydrALAZINE (APRESOLINE) 100 MG tablet Take 100 mg by mouth 3 (three) times daily.     lamoTRIgine  (LAMICTAL ) 200 MG tablet Take 1 tablet (200 mg total) by mouth daily. 90 tablet 2   losartan  (COZAAR ) 100 MG tablet Take 100 mg by mouth daily.     meloxicam (MOBIC) 15 MG tablet Take 1 tablet (15 mg total) by mouth daily.     metoCLOPramide  (REGLAN ) 5 MG tablet Take 1 tablet by mouth every 8 (eight) hours as needed.     Omega-3 Fatty Acids (FISH OIL PO) Take 1 capsule by mouth in the morning and at bedtime.     OVER THE COUNTER MEDICATION Vit D 400 units capsule daily     potassium chloride  (MICRO-K ) 10 MEQ CR capsule TAKE 2 CAPSULES BY MOUTH DAILY AS NEEDED ( TAKE ON DAYS WHEN YOU TAKE TORSEMIDE ) 14 capsule 0   simvastatin  (ZOCOR ) 20 MG tablet Take 20 mg by mouth daily with lunch.     torsemide  (DEMADEX ) 20 MG tablet Take 20 mg by mouth every other day.     vitamin B-12 (CYANOCOBALAMIN ) 1000 MCG tablet Take 1,000 mcg by mouth every evening.     zolpidem  (AMBIEN ) 10 MG tablet Take 1 tablet (10 mg total) by mouth at bedtime as needed. for sleep 30 tablet 5   No current facility-administered medications for this visit.     Musculoskeletal: Strength & Muscle Tone: na Gait & Station: na Patient leans: N/A  Psychiatric Specialty Exam: Review of Systems  All other systems reviewed and are negative.   There were no vitals taken for this visit.There is no height or weight on file to calculate BMI.  General Appearance: na  Eye Contact:  na  Speech:  Clear and Coherent  Volume:  Normal  Mood:  Euthymic  Affect:  NA  Thought Process:  Goal Directed  Orientation:  Full (Time, Place,  and Person)  Thought Content: WDL   Suicidal Thoughts:  No  Homicidal Thoughts:  No  Memory:  Immediate;   Good Recent;   Good Remote;   NA  Judgement:  Good  Insight:  Fair  Psychomotor Activity:  Decreased  Concentration:  Concentration: Good and Attention Span: Good  Recall:  Good  Fund of Knowledge: Good  Language: Good  Akathisia:  No  Handed:  Right  AIMS (if indicated): not done  Assets:  Communication Skills Desire for Improvement Resilience Social Support  ADL's:  Intact  Cognition: WNL  Sleep:  Fair   Screenings: PHQ2-9    Flowsheet Row Video Visit from 09/05/2021 in [facility] [place] Video Visit from 03/08/2021 in [facility] at [place] Video Visit from 09/06/2020 in [facility] Total Score 1 0 1      Flowsheet Row Video Visit from 09/05/2021 in [facility] at [place] Video Visit from 03/08/2021 in [facility] at [place] ED from 02/11/2021 in [facility] Emergency Department at [facility]  C-SSRS RISK CATEGORY No Risk No Risk No Risk        Assessment and Plan: This patient is an 83 year old female with a history of bipolar disorder.  She is doing well on her current regimen.  She will continue Ambien  10 mg at bedtime for sleep, Abilify  10 mg at bedtime as well as Lamictal  200 mg daily for mood stabilization and lorazepam  0.5 mg twice daily as needed for anxiety.  She will return to see me in 6 months  Collaboration of Care: Collaboration of Care: Other provider involved in patient's care AEB notes are shared with GI on the epic system  Patient/Guardian was advised Release of Information must be obtained prior to any record release in order to collaborate their care with an outside provider. Patient/Guardian was advised if they have not already done so to contact the registration  department to sign all necessary forms in order for us  to release information regarding their care.   Consent: Patient/Guardian gives verbal consent for treatment and assignment of benefits for services provided during this visit. Patient/Guardian expressed understanding and agreed to proceed.    [name], MD 03/09/2023, 9:31 AM

## 2023-08-09 ENCOUNTER — Ambulatory Visit (INDEPENDENT_AMBULATORY_CARE_PROVIDER_SITE_OTHER): Admitting: Gastroenterology

## 2023-08-16 ENCOUNTER — Ambulatory Visit (INDEPENDENT_AMBULATORY_CARE_PROVIDER_SITE_OTHER): Admitting: Gastroenterology

## 2023-09-03 ENCOUNTER — Other Ambulatory Visit: Payer: Self-pay

## 2023-09-03 ENCOUNTER — Ambulatory Visit (INDEPENDENT_AMBULATORY_CARE_PROVIDER_SITE_OTHER): Admitting: Gastroenterology

## 2023-09-03 ENCOUNTER — Emergency Department (HOSPITAL_COMMUNITY)
Admission: EM | Admit: 2023-09-03 | Discharge: 2023-09-03 | Discharge status: Against Medical Advice | Attending: Emergency Medicine | Admitting: Emergency Medicine

## 2023-09-03 ENCOUNTER — Encounter (HOSPITAL_COMMUNITY): Payer: Self-pay | Admitting: *Deleted

## 2023-09-03 ENCOUNTER — Encounter (INDEPENDENT_AMBULATORY_CARE_PROVIDER_SITE_OTHER): Payer: Self-pay | Admitting: Gastroenterology

## 2023-09-03 VITALS — BP 230/58 | HR 35 | Temp 97.3°F | Ht 64.0 in | Wt 176.3 lb

## 2023-09-03 DIAGNOSIS — Z5321 Procedure and treatment not carried out due to patient leaving prior to being seen by health care provider: Secondary | ICD-10-CM | POA: Diagnosis not present

## 2023-09-03 DIAGNOSIS — I160 Hypertensive urgency: Secondary | ICD-10-CM | POA: Insufficient documentation

## 2023-09-03 DIAGNOSIS — R110 Nausea: Secondary | ICD-10-CM | POA: Diagnosis not present

## 2023-09-03 DIAGNOSIS — I10 Essential (primary) hypertension: Secondary | ICD-10-CM | POA: Diagnosis not present

## 2023-09-03 DIAGNOSIS — R519 Headache, unspecified: Secondary | ICD-10-CM | POA: Diagnosis present

## 2023-09-03 MED ORDER — SCOPOLAMINE 1 MG/3DAYS TD PT72: 1.0000 | MEDICATED_PATCH | TRANSDERMAL | 12 refills | Status: AC

## 2023-09-03 NOTE — ED Triage Notes (Signed)
 Pt sent by GI doctor due to her high blood pressure.  + HA, denies any CP.

## 2023-09-03 NOTE — Progress Notes (Signed)
 Toribio Fortune, M.D. Gastroenterology & Hepatology [facility] [address] [place], [place] [address]  Primary Care Physician: Elliott, Dianne E [address] [place] [id number]  I will communicate my assessment and recommendations to the referring MD via EMR.  Problems: Chronic postprandial  nausea History of post ERCP pancreatitis H/o perforated diverticulitis   History of Present Illness: Lindsay Palmer is a 83 y.o. 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  The patient was last seen on 02/26/2023. At that time, the patient was continued on Elavil  10 mg at bedtime.  Patient reports having persisting nausea despite taking Elavil  in the past. As she was having persistent nausea, she quit taking Elavil . She is having nausea every 5-6 days per week. No vomiting. Food intake may vary, as she may have more food on the days she is not nauseated.  Husband states she has not been compliant with diuretics.  He has noticed her face looks more swollen today and her pressure has been uncontrolled recently.  The patient denies having any fever, chills, hematochezia, melena, hematemesis, abdominal distention, abdominal pain, diarrhea, jaundice, pruritus . Has lost some weight recently.  Last EGD: 03/2020 - Normal esophagus. - Z-line regular, 37 cm from the incisors. - 3 cm hiatal hernia. - Gastritis. Biopsied. - Normal duodenal bulb and second portion of the duodenum. Biopsied.   Last Colonoscopy: 03/2020 - Diverticulosis in the proximal sigmoid colon. - Patent end-to-end colo-colonic anastomosis. - External hemorrhoids.    Path: A. STOMACH, BIOPSY:  - Mild chronic gastritis and reactive gastropathy.  - Intestinal metaplasia is present focally in antrum, negative for  dysplasia.  - Warthin-Starry stain is negative for Helicobacter pylori.   B. DUODENUM, BIOPSY:  - Duodenal mucosa with no significant pathologic findings.  - Negative for increased intraepithelial lymphocytes and villous  architectural changes.   Past Medical History: Past Medical History:  Diagnosis Date   Bipolar 2 disorder (HCC)    Chronic insomnia    Congestive heart failure (CHF) (HCC) 12/2019   Diverticulosis of colon    Dyspnea    Elevated LFTs    Fatty liver disease, nonalcoholic    GERD (gastroesophageal reflux disease)    H/O first degree atrioventricular block    Hypercholesteremia    Hypertension    Major depressive disorder    Migraine    Osteopenia    Pancreatitis    Pneumonia    Sleep apnea    Stop Bang score of 4. Pt said she had sleep apnea at one time, but she has had another sleep study since then and they said she does not have it anymore.   Vitamin D deficiency     Past Surgical History: Past Surgical History:  Procedure Laterality Date   BILIARY STENT PLACEMENT N/A 07/18/2013   Procedure: BILIARY STENT PLACEMENT;  Surgeon: Claudis RAYMOND Rivet, MD;  Location: [facility];  Service: Endoscopy;  Laterality: N/A;   BIOPSY  03/24/2020   Procedure: BIOPSY;  Surgeon: Rivet Claudis RAYMOND, MD;  Location: [facility];  Service: Endoscopy;;   Cataract Surgery Bilateral 08-2012   CHOLECYSTECTOMY     COLONOSCOPY  2003   Diverticulitis h/o polyps -due next 2015 -gastroscopy 2003   COLONOSCOPY WITH PROPOFOL  N/A 03/24/2020  Procedure: COLONOSCOPY WITH PROPOFOL ;  Surgeon: Golda Claudis PENNER, MD;  Location: [facility];  Service: Endoscopy;  Laterality: N/A;  9:00, pt tested + 1/25 <90 days   ERCP     ERCP N/A 07/18/2013   Procedure: ENDOSCOPIC RETROGRADE CHOLANGIOPANCREATOGRAPHY (ERCP) COMMON BILE DUCT BRUSHING;  Surgeon:  Claudis PENNER Golda, MD;  Location: [facility];  Service: Endoscopy;  Laterality: N/A;   ERCP N/A 11/07/2013   Procedure: ENDOSCOPIC RETROGRADE CHOLANGIOPANCREATOGRAPHY (ERCP);  Surgeon: Claudis PENNER Golda, MD;  Location: [facility];  Service: Endoscopy;  Laterality: N/A;   ESOPHAGEAL DILATION N/A 08/23/2016   Procedure: ESOPHAGEAL DILATION;  Surgeon: Golda Claudis PENNER, MD;  [facility];  Service: Endoscopy;  Laterality: N/A;   ESOPHAGOGASTRODUODENOSCOPY N/A 08/23/2016   Procedure: ESOPHAGOGASTRODUODENOSCOPY (EGD);  Surgeon: Golda Claudis PENNER, MD;  [facility];  Service: Endoscopy;  Laterality: N/A;  245-moved up to 100 per Jenkins   ESOPHAGOGASTRODUODENOSCOPY (EGD) WITH PROPOFOL  N/A 03/24/2020   Procedure: ESOPHAGOGASTRODUODENOSCOPY (EGD) WITH PROPOFOL ;  Surgeon: Golda Claudis PENNER, MD;  [facility]: [facility];  Service: Endoscopy;  Laterality: N/A;   EYE SURGERY Bilateral    cataract removal   NECK SURGERY     Stenosis C6-C7   PARTIAL COLECTOMY  2018   SIGMOIDOSCOPY  2007   no polyps   SPHINCTEROTOMY N/A 07/18/2013   Procedure: SPHINCTEROTOMY;  Surgeon: Claudis PENNER Golda, MD;  Location: [facility];  Service: Endoscopy;  Laterality: N/A;   SPHINCTEROTOMY  11/07/2013   Procedure: EXTENSION OF SPHINCTEROTOMY;  Surgeon: Claudis PENNER Golda, MD;  Location: [facility];  Service: Endoscopy;;   STENT REMOVAL N/A 11/07/2013   Procedure: STENT REMOVAL;  Surgeon: Claudis PENNER Golda, MD;  Location: [facility];  Service: Endoscopy;  Laterality: N/A;    Family History: Family History  Problem Relation Age of Onset   Diabetes Mother    Coronary artery disease Father    Gout Father    Breast cancer Sister    Pancreatic cancer Brother    Alcohol abuse Brother    Healthy Brother    Lung cancer Sister    Healthy Sister    Depression Sister    Hyperlipidemia Daughter    Hyperlipidemia Son    Bipolar disorder Son    Healthy Sister    Healthy Sister    Seizures Other     Social History: Social History   Tobacco  Use  Smoking Status Never   Passive exposure: Never  Smokeless Tobacco Never   Social History   Substance and Sexual Activity  Alcohol Use No   Alcohol/week: 0.0 standard drinks of alcohol   Social History   Substance and Sexual Activity  Drug Use No    Allergies: Allergies  Allergen Reactions   Codeine Nausea And Vomiting   Ciprofloxacin  Nausea And Vomiting   Flagyl  [Metronidazole ] Nausea And Vomiting   Oxycodone  Nausea And Vomiting    Medications: Current Outpatient Medications  Medication Sig Dispense Refill   acetaminophen  (TYLENOL ) 500 MG tablet Take 500-1,000 mg by mouth every 6 (six) hours as needed (pain).     amitriptyline  (ELAVIL ) 10 MG tablet Take 1 tablet (10 mg total) by mouth at bedtime. 90 tablet 3   ARIPiprazole  (ABILIFY ) 10 MG tablet Take 1 tablet (10 mg total) by mouth at bedtime. 90 tablet 2   carvedilol (COREG) 25 MG tablet Take 25 mg by mouth 2 (two) times daily with a meal. LUNCH & EVENING     Cholecalciferol  (VITAMIN D3) 10 MCG (400 UNIT)  CAPS Take 400 Units by mouth daily.     esomeprazole  (NEXIUM ) 40 MG capsule TAKE 1 CAPSULE BY MOUTH ONCE DAILY BEFORE BREAKFAST 90 capsule 3   ferrous sulfate  (FERROUSUL) 325 (65 FE) MG tablet Take 1 tablet (325 mg total) by mouth 2 (two) times daily. (Patient taking differently: Take 325 mg by mouth daily with breakfast.)  0   hydrALAZINE (APRESOLINE) 100 MG tablet Take 100 mg by mouth 3 (three) times daily.     lamoTRIgine  (LAMICTAL ) 200 MG tablet Take 1 tablet (200 mg total) by mouth daily. 90 tablet 2   LORazepam  (ATIVAN ) 0.5 MG tablet Take 1 tablet (0.5 mg total) by mouth 2 (two) times daily as needed for anxiety. 60 tablet 5   losartan  (COZAAR ) 100 MG tablet Take 100 mg by mouth daily.     meloxicam (MOBIC) 15 MG tablet Take 1 tablet (15 mg total) by mouth daily.     metoCLOPramide  (REGLAN ) 5 MG tablet Take 1 tablet by mouth every 8 (eight) hours as needed.     Omega-3 Fatty Acids (FISH OIL PO) Take 1 capsule by  mouth in the morning and at bedtime.     OVER THE COUNTER MEDICATION Vit D 400 units capsule daily     potassium chloride  (MICRO-K ) 10 MEQ CR capsule TAKE 2 CAPSULES BY MOUTH DAILY AS NEEDED ( TAKE ON DAYS WHEN YOU TAKE TORSEMIDE ) 14 capsule 0   simvastatin  (ZOCOR ) 20 MG tablet Take 20 mg by mouth daily with lunch.     torsemide  (DEMADEX ) 20 MG tablet Take 20 mg by mouth every other day.     vitamin B-12 (CYANOCOBALAMIN ) 1000 MCG tablet Take 1,000 mcg by mouth every evening.     zolpidem  (AMBIEN ) 10 MG tablet Take 1 tablet (10 mg total) by mouth at bedtime as needed. for sleep 30 tablet 5   No current facility-administered medications for this visit.    Review of Systems: GENERAL: negative for malaise, night sweats HEENT: No changes in hearing or vision, no nose bleeds or other nasal problems. NECK: Negative for lumps, goiter, pain and significant neck swelling RESPIRATORY: Negative for cough, wheezing CARDIOVASCULAR: Negative for chest pain, leg swelling, palpitations, orthopnea GI: SEE HPI MUSCULOSKELETAL: Negative for joint pain or swelling, back pain, and muscle pain. SKIN: Negative for lesions, rash PSYCH: Negative for sleep disturbance, mood disorder and recent psychosocial stressors. HEMATOLOGY Negative for prolonged bleeding, bruising easily, and swollen nodes. ENDOCRINE: Negative for cold or heat intolerance, polyuria, polydipsia and goiter. NEURO: negative for tremor, gait imbalance, syncope and seizures. The remainder of the review of systems is noncontributory.   Physical Exam: There were no vitals taken for this visit. GENERAL: The patient is AO x3, in no acute distress. HEENT: Head is normocephalic and atraumatic. EOMI are intact. Mouth is well hydrated and without lesions. NECK: Supple. No masses LUNGS: Clear to auscultation. No presence of rhonchi/wheezing/rales. Adequate chest expansion HEART: RRR, normal s1 and s2. ABDOMEN: Soft, nontender, no guarding, no  peritoneal signs, and nondistended. BS +. No masses. EXTREMITIES: Without any cyanosis, clubbing, rash, lesions or edema. NEUROLOGIC: AOx3, no focal motor deficit. SKIN: no jaundice, no rashes  Imaging/Labs: as above  I personally reviewed and interpreted the available labs, imaging and endoscopic files.  Impression and Plan: Lindsay Palmer is a 83 y.o. 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  Patient has had multiple investigations for chronic nausea without clear-cut reason for this.  This includes blood testing for Addison's disease, cerebral pathology, cross-sectional abdominal imaging and endoscopic investigations.  She has been refractory to multiple different agents.  Due to these, we will start her on scopolamine  patches for now.  Notably, the patient has presented significantly elevated blood pressure in the setting of poor medication compliance..  Due to this, I will recommend her to have emergent evaluation in the ER.  I called the ER to notify them of the patient.  - Patient should go to the emergency department to have evaluation of elevated blood pressure -Start scopolamine  patches, 1 patch every 72 hours for nausea  All questions were answered.      Toribio Fortune, MD Gastroenterology and Hepatology [facility]astroenter[facility]

## 2023-09-03 NOTE — ED Notes (Signed)
 Pt states she has not taken her hypertensive medication today or yesterday.

## 2023-09-03 NOTE — ED Notes (Signed)
 Pt and pt's husband seen leaving the waiting room, husband stated that she will go home and take her blood pressure medication and unwilling to wait any longer.

## 2023-09-03 NOTE — Patient Instructions (Signed)
 Please go to the emergency department to have evaluation of elevated blood pressure Start scopolamine  patches, 1 patch every 72 hours for nausea

## 2023-09-04 ENCOUNTER — Telehealth (INDEPENDENT_AMBULATORY_CARE_PROVIDER_SITE_OTHER): Payer: Self-pay

## 2023-09-04 NOTE — Telephone Encounter (Signed)
 Lindsay Palmer

## 2023-09-06 ENCOUNTER — Telehealth (HOSPITAL_COMMUNITY): Payer: Medicare Other | Admitting: Psychiatry

## 2023-09-06 ENCOUNTER — Encounter (HOSPITAL_COMMUNITY): Payer: Self-pay | Admitting: Psychiatry

## 2023-09-06 ENCOUNTER — Other Ambulatory Visit (HOSPITAL_COMMUNITY): Payer: Self-pay | Admitting: Psychiatry

## 2023-09-06 DIAGNOSIS — F3130 Bipolar disorder, current episode depressed, mild or moderate severity, unspecified: Secondary | ICD-10-CM | POA: Diagnosis not present

## 2023-09-06 MED ORDER — LAMOTRIGINE 200 MG PO TABS: 200.0000 mg | ORAL_TABLET | Freq: Every day | ORAL | 2 refills | Status: AC

## 2023-09-06 MED ORDER — ARIPIPRAZOLE 10 MG PO TABS: 10.0000 mg | ORAL_TABLET | Freq: Every day | ORAL | 2 refills | Status: AC

## 2023-09-06 MED ORDER — LORAZEPAM 0.5 MG PO TABS: 0.5000 mg | ORAL_TABLET | Freq: Two times a day (BID) | ORAL | 5 refills | Status: AC | PRN

## 2023-09-06 MED ORDER — AMITRIPTYLINE HCL 10 MG PO TABS: 10.0000 mg | ORAL_TABLET | Freq: Every day | ORAL | 2 refills | Status: AC

## 2023-09-06 MED ORDER — ZOLPIDEM TARTRATE 10 MG PO TABS: 10.0000 mg | ORAL_TABLET | Freq: Every evening | ORAL | 5 refills | Status: AC | PRN

## 2023-09-06 NOTE — Progress Notes (Signed)
 Virtual Visit via Telephone Note  I connected with Lindsay Palmer on 09/06/23 at  1:00 PM EDT by telephone and verified that I am speaking with the correct person using two identifiers.  Location: Patient: home Provider: office   I discussed the limitations, risks, security and privacy concerns of performing an evaluation and management service by telephone and the availability of in person appointments. I also discussed with the patient that there may be a patient responsible charge related to this service. The patient expressed understanding and agreed to proceed.      I discussed the assessment and treatment plan with the patient. The patient was provided an opportunity to ask questions and all were answered. The patient agreed with the plan and demonstrated an understanding of the instructions.   The patient was advised to call back or seek an in-person evaluation if the symptoms worsen or if the condition fails to improve as anticipated.  I provided 20 minutes of non-face-to-face time during this encounter.   Lindsay Gull, MD  [facility] MD/PA/NP OP Progress Note  09/06/2023 1:17 PM Lindsay Palmer  MRN:  [id number]  Chief Complaint:  Chief Complaint  Patient presents with   Depression   Anxiety   Manic Behavior   Follow-up   HPI: This patient is an 83 year old married white female who lives with her husband in [place] .  She is retired from [employer]  She has 1 son, 1 daughter and 3 grandchildren.   This patient returns for follow-up after 6 months regarding her bipolar disorder anxiety and insomnia.  She states that over the last few days she has not been feeling well.  She went to the emergency room several days ago but did not stay.  Her systolic blood pressure was 245 but she states she had forgotten her medicine for couple of days.  She has been rechecking it at home and it has come down considerably and she has been taking her medication every day.  She also  states that she is no longer taking the amitriptyline  prescribed by GI to help with her stomach issues.  She is not sleeping well and I explained that this helps sleep as well and I will reinstate it.  It does not look like anyone had actually stopped it but she stopped taking it on her own.  She does seem a bit confused about her medicines and I strongly urged her to ask her pharmacy to bubble pack her medicines or find a pharmacy that well.  She states her mood is good and she has not been depressed nor manic.  She denies racing thoughts irritability or anger.  She denies thoughts of suicide or self-harm.  She still not sleeping very well Visit Diagnosis:    ICD-10-CM   1. Bipolar I disorder, most recent episode depressed (HCC)  F31.30       Past Psychiatric History: Long-term outpatient treatment for bipolar disorder  Past Medical History:  Past Medical History:  Diagnosis Date   Bipolar 2 disorder (HCC)    Chronic insomnia    Congestive heart failure (CHF) (HCC) 12/2019   Diverticulosis of colon    Dyspnea    Elevated LFTs    Fatty liver disease, nonalcoholic    GERD (gastroesophageal reflux disease)    H/O first degree atrioventricular block    Hypercholesteremia    Hypertension    Major depressive disorder    Migraine    Osteopenia    Pancreatitis    Pneumonia  Sleep apnea    Stop Bang score of 4. Pt said she had sleep apnea at one time, but she has had another sleep study since then and they said she does not have it anymore.   Vitamin D deficiency     Past Surgical History:  Procedure Laterality Date   BILIARY STENT PLACEMENT N/A 07/18/2013   Procedure: BILIARY STENT PLACEMENT;  Surgeon: Claudis RAYMOND Rivet, MD;  Location: [facility];  Service: Endoscopy;  Laterality: N/A;   BIOPSY  03/24/2020   Procedure: BIOPSY;  Surgeon: Rivet Claudis RAYMOND, MD;  [facility];  Service: Endoscopy;;   Cataract Surgery Bilateral 08-2012   CHOLECYSTECTOMY     COLONOSCOPY  2003    Diverticulitis h/o polyps -due next 2015 -gastroscopy 2003   COLONOSCOPY WITH PROPOFOL  N/A 03/24/2020   Procedure: COLONOSCOPY WITH PROPOFOL ;  Surgeon: Rivet Claudis RAYMOND, MD;  [facility];  Service: Endoscopy;  Laterality: N/A;  9:00, pt tested + 1/25 <90 days   ERCP     ERCP N/A 07/18/2013   Procedure: ENDOSCOPIC RETROGRADE CHOLANGIOPANCREATOGRAPHY (ERCP) COMMON BILE DUCT BRUSHING;  Surgeon: Claudis RAYMOND Rivet, MD;  Location: [facility];  Service: Endoscopy;  Laterality: N/A;   ERCP N/A 11/07/2013   Procedure: ENDOSCOPIC RETROGRADE CHOLANGIOPANCREATOGRAPHY (ERCP);  Surgeon: Claudis RAYMOND Rivet, MD;  Location: [facility];  Service: Endoscopy;  Laterality: N/A;   ESOPHAGEAL DILATION N/A 08/23/2016   Procedure: ESOPHAGEAL DILATION;  Surgeon: Rivet Claudis RAYMOND, MD;  [facility];  Service: Endoscopy;  Laterality: N/A;   ESOPHAGOGASTRODUODENOSCOPY N/A 08/23/2016   Procedure: ESOPHAGOGASTRODUODENOSCOPY (EGD);  Surgeon: Rivet Claudis RAYMOND, MD;  [facility];  Service: Endoscopy;  Laterality: N/A;  245-moved up to 100 per Jenkins   ESOPHAGOGASTRODUODENOSCOPY (EGD) WITH PROPOFOL  N/A 03/24/2020   Procedure: ESOPHAGOGASTRODUODENOSCOPY (EGD) WITH PROPOFOL ;  Surgeon: Rivet Claudis RAYMOND, MD;  [facility];  Service: Endoscopy;  Laterality: N/A;   EYE SURGERY Bilateral    cataract removal   NECK SURGERY     Stenosis C6-C7   PARTIAL COLECTOMY  2018   SIGMOIDOSCOPY  2007   no polyps   SPHINCTEROTOMY N/A 07/18/2013   Procedure: SPHINCTEROTOMY;  Surgeon: Claudis RAYMOND Rivet, MD;  Location: [facility];  Service: Endoscopy;  Laterality: N/A;   SPHINCTEROTOMY  11/07/2013   Procedure: EXTENSION OF SPHINCTEROTOMY;  Surgeon: Claudis RAYMOND Rivet, MD;  Location: [facility];  Service: Endoscopy;;   STENT REMOVAL N/A 11/07/2013   Procedure: STENT REMOVAL;  Surgeon: Claudis RAYMOND Rivet, MD;  Location: [facility];  Service: Endoscopy;  Laterality: N/A;    Family Psychiatric History: See below  Family History:  Family History   Problem Relation Age of Onset   Diabetes Mother    Coronary artery disease Father    Gout Father    Breast cancer Sister    Pancreatic cancer Brother    Alcohol abuse Brother    Healthy Brother    Lung cancer Sister    Healthy Sister    Depression Sister    Hyperlipidemia Daughter    Hyperlipidemia Son    Bipolar disorder Son    Healthy Sister    Healthy Sister    Seizures Other     Social History:  Social History   Socioeconomic History   Marital status: Married    Spouse name: Not on file   Number of children: Not on file   Years of education: Not on file   Highest education level: Not on file  Occupational History  Not on file  Tobacco Use   Smoking status: Never    Passive exposure: Never   Smokeless tobacco: Never  Vaping Use   Vaping status: Never Used  Substance and Sexual Activity   Alcohol use: No    Alcohol/week: 0.0 standard drinks of alcohol   Drug use: No   Sexual activity: Not on file  Other Topics Concern   Not on file  Social History Narrative   Not on file   Social Drivers of Health   Financial Resource Strain: Not on file  Food Insecurity: Not on file  Transportation Needs: Not on file  Physical Activity: Not on file  Stress: Not on file  Social Connections: Not on file    Allergies:  Allergies  Allergen Reactions   Codeine Nausea And Vomiting   Ciprofloxacin  Nausea And Vomiting   Flagyl  [Metronidazole ] Nausea And Vomiting   Oxycodone  Nausea And Vomiting    Metabolic Disorder Labs: No results found for: HGBA1C, MPG No results found for: PROLACTIN Lab Results  Component Value Date   TRIG 113 07/28/2013   Lab Results  Component Value Date   TSH 1.38 02/08/2021   TSH 1.213 09/14/2014    Therapeutic Level Labs: No results found for: LITHIUM No results found for: VALPROATE No results found for: CBMZ  Current Medications: Current Outpatient Medications  Medication Sig Dispense Refill   acetaminophen   (TYLENOL ) 500 MG tablet Take 500-1,000 mg by mouth every 6 (six) hours as needed (pain).     amitriptyline  (ELAVIL ) 10 MG tablet Take 1 tablet (10 mg total) by mouth at bedtime. 90 tablet 2   ARIPiprazole  (ABILIFY ) 10 MG tablet Take 1 tablet (10 mg total) by mouth at bedtime. 90 tablet 2   carvedilol (COREG) 25 MG tablet Take 25 mg by mouth 2 (two) times daily with a meal. LUNCH & EVENING     Cholecalciferol  (VITAMIN D3) 10 MCG (400 UNIT) CAPS Take 400 Units by mouth daily.     esomeprazole  (NEXIUM ) 40 MG capsule TAKE 1 CAPSULE BY MOUTH ONCE DAILY BEFORE BREAKFAST 90 capsule 3   ferrous sulfate  (FERROUSUL) 325 (65 FE) MG tablet Take 1 tablet (325 mg total) by mouth 2 (two) times daily. (Patient taking differently: Take 325 mg by mouth daily with breakfast.)  0   hydrALAZINE (APRESOLINE) 100 MG tablet Take 100 mg by mouth 3 (three) times daily.     lamoTRIgine  (LAMICTAL ) 200 MG tablet Take 1 tablet (200 mg total) by mouth daily. 90 tablet 2   losartan  (COZAAR ) 100 MG tablet Take 100 mg by mouth daily.     meloxicam (MOBIC) 15 MG tablet Take 1 tablet (15 mg total) by mouth daily.     metoCLOPramide  (REGLAN ) 5 MG tablet Take 1 tablet by mouth every 8 (eight) hours as needed.     Omega-3 Fatty Acids (FISH OIL PO) Take 1 capsule by mouth in the morning and at bedtime. (Patient taking differently: Take 1 capsule by mouth daily.)     OVER THE COUNTER MEDICATION Vit D 400 units capsule daily     potassium chloride  (MICRO-K ) 10 MEQ CR capsule TAKE 2 CAPSULES BY MOUTH DAILY AS NEEDED ( TAKE ON DAYS WHEN YOU TAKE TORSEMIDE ) 14 capsule 0   scopolamine  (TRANSDERM-SCOP) 1 MG/3DAYS Place 1 patch (1.5 mg total) onto the skin every 3 (three) days. 10 patch 12   simvastatin  (ZOCOR ) 20 MG tablet Take 20 mg by mouth daily with lunch.     torsemide  (DEMADEX ) 20 MG  tablet Take 20 mg by mouth every other day.     vitamin B-12 (CYANOCOBALAMIN ) 1000 MCG tablet Take 1,000 mcg by mouth every evening.     zolpidem  (AMBIEN )  10 MG tablet Take 1 tablet (10 mg total) by mouth at bedtime as needed. for sleep 30 tablet 5   No current facility-administered medications for this visit.     Musculoskeletal: Strength & Muscle Tone: na Gait & Station: na Patient leans: N/A  Psychiatric Specialty Exam: Review of Systems  Gastrointestinal:  Positive for abdominal pain and nausea.  Psychiatric/Behavioral:  Positive for sleep disturbance.   All other systems reviewed and are negative.   There were no vitals taken for this visit.There is no height or weight on file to calculate BMI.  General Appearance: NA  Eye Contact:  NA  Speech:  Clear and Coherent  Volume:  Normal  Mood:  Euthymic  Affect:  Congruent  Thought Process:  Goal Directed  Orientation:  Full (Time, Place, and Person)  Thought Content: WDL   Suicidal Thoughts:  No  Homicidal Thoughts:  No  Memory:  Immediate;   Good Recent;   Fair Remote;   NA  Judgement:  Fair  Insight:  Fair  Psychomotor Activity:  Decreased  Concentration:  Concentration: Good and Attention Span: Good  Recall:  Fair  Fund of Knowledge: Good  Language: Good  Akathisia:  No  Handed:  Right  AIMS (if indicated): not done  Assets:  Communication Skills Desire for Improvement Resilience Social Support  ADL's:  Intact  Cognition: Impaired,  Mild  Sleep:  Poor   Screenings: PHQ2-9    Flowsheet Row Video Visit from 09/05/2021 in [facility] at [place] Video Visit from 03/08/2021 in [facility] at [place] Video Visit from 09/06/2020 in [facility] Total Score 1 0 1   Flowsheet Row ED from 09/03/2023 in [facility] at [facility] Video Visit from 09/05/2021 in [facility] at [place] Video Visit from 03/08/2021 in [facility] at [place]  C-SSRS RISK CATEGORY No Risk No Risk No  Risk     Assessment and Plan: This patient is an 83 year old female with a history of bipolar disorder and sleep difficulties.  She also has functional abdominal pain.  For some reason she had stopped taking the amitriptyline  10 mg at bedtime so I will reinstate this.  She will continue Ambien  10 mg at bedtime for sleep, Abilify  10 mg at bedtime as well as Lamictal  200 mg daily for mood stabilization and lorazepam  0.5 mg twice daily as needed for anxiety.  She will return to see me in 6 months  Collaboration of Care: Collaboration of Care: Other provider involved in patient's care AEB notes are shared with GI on the epic system  Patient/Guardian was advised Release of Information must be obtained prior to any record release in order to collaborate their care with an outside provider. Patient/Guardian was advised if they have not already done so to contact the registration department to sign all necessary forms in order for us  to release information regarding their care.   Consent: Patient/Guardian gives verbal consent for treatment and assignment of benefits for services provided during this visit. Patient/Guardian expressed understanding and agreed to proceed.    Lindsay Gull, MD 09/06/2023, 1:17 PM

## 2023-10-05 ENCOUNTER — Telehealth (HOSPITAL_COMMUNITY): Payer: Self-pay

## 2023-10-05 NOTE — Telephone Encounter (Signed)
 I would suggest taking the amitriptyline  first and only take the ambien  if she can't sleep after an hour or so

## 2023-10-05 NOTE — Telephone Encounter (Signed)
 Spoke with pt's husband advised of Dr Nada message he verbalized understanding

## 2023-10-05 NOTE — Telephone Encounter (Signed)
 Pt's husband lynwood called in stating that pt has been waking up confused sometimes and dizzy. He is wanting to know if she should be taking her amtriptyline and her zolpidem  together. Please advise

## 2023-10-08 ENCOUNTER — Telehealth (HOSPITAL_COMMUNITY): Payer: Self-pay | Admitting: *Deleted

## 2023-10-08 NOTE — Telephone Encounter (Signed)
 Joliza sent me this message last week and we addressed it

## 2023-10-08 NOTE — Telephone Encounter (Signed)
 Patient husband called and left message on voicemail. Per pt husband, is it alright for patient to take Amitriptyline  and zolpidem  together at night. Per pt husband, he has noticed some side effects and know there's some side effects with these two medications when combined. Per pt husband, he would like to get advise from provider.

## 2023-11-14 ENCOUNTER — Encounter (INDEPENDENT_AMBULATORY_CARE_PROVIDER_SITE_OTHER): Payer: Self-pay | Admitting: Gastroenterology

## 2023-11-20 ENCOUNTER — Encounter (INDEPENDENT_AMBULATORY_CARE_PROVIDER_SITE_OTHER): Payer: Self-pay | Admitting: Gastroenterology

## 2023-12-31 ENCOUNTER — Ambulatory Visit (INDEPENDENT_AMBULATORY_CARE_PROVIDER_SITE_OTHER): Admitting: Gastroenterology

## 2024-01-22 ENCOUNTER — Ambulatory Visit (INDEPENDENT_AMBULATORY_CARE_PROVIDER_SITE_OTHER): Admitting: Gastroenterology

## 2024-02-25 ENCOUNTER — Ambulatory Visit (INDEPENDENT_AMBULATORY_CARE_PROVIDER_SITE_OTHER): Admitting: Gastroenterology

## 2024-03-10 ENCOUNTER — Ambulatory Visit (HOSPITAL_COMMUNITY): Admitting: Psychiatry
